# Patient Record
Sex: Male | Born: 1950
Health system: Southern US, Community
[De-identification: ages and names within clinical notes are randomized; demographics above are authoritative.]

## PROBLEM LIST (undated history)

## (undated) DIAGNOSIS — Z800 Family history of malignant neoplasm of digestive organs: Secondary | ICD-10-CM

## (undated) DIAGNOSIS — E669 Obesity, unspecified: Secondary | ICD-10-CM

## (undated) DIAGNOSIS — E039 Hypothyroidism, unspecified: Secondary | ICD-10-CM

## (undated) DIAGNOSIS — R001 Bradycardia, unspecified: Secondary | ICD-10-CM

## (undated) DIAGNOSIS — I219 Acute myocardial infarction, unspecified: Secondary | ICD-10-CM

## (undated) DIAGNOSIS — D126 Benign neoplasm of colon, unspecified: Secondary | ICD-10-CM

## (undated) DIAGNOSIS — E559 Vitamin D deficiency, unspecified: Secondary | ICD-10-CM

## (undated) DIAGNOSIS — I2510 Atherosclerotic heart disease of native coronary artery without angina pectoris: Secondary | ICD-10-CM

## (undated) DIAGNOSIS — Z951 Presence of aortocoronary bypass graft: Secondary | ICD-10-CM

## (undated) DIAGNOSIS — R7303 Prediabetes: Secondary | ICD-10-CM

## (undated) DIAGNOSIS — I509 Heart failure, unspecified: Secondary | ICD-10-CM

## (undated) DIAGNOSIS — J449 Chronic obstructive pulmonary disease, unspecified: Secondary | ICD-10-CM

## (undated) DIAGNOSIS — E785 Hyperlipidemia, unspecified: Secondary | ICD-10-CM

## (undated) DIAGNOSIS — F32A Depression, unspecified: Secondary | ICD-10-CM

## (undated) DIAGNOSIS — I2119 ST elevation (STEMI) myocardial infarction involving other coronary artery of inferior wall: Secondary | ICD-10-CM

## (undated) DIAGNOSIS — F329 Major depressive disorder, single episode, unspecified: Secondary | ICD-10-CM

## (undated) DIAGNOSIS — I10 Essential (primary) hypertension: Secondary | ICD-10-CM

## (undated) DIAGNOSIS — Z720 Tobacco use: Secondary | ICD-10-CM

## (undated) DIAGNOSIS — K219 Gastro-esophageal reflux disease without esophagitis: Secondary | ICD-10-CM

## (undated) HISTORY — PX: CORONARY ANGIOPLASTY WITH STENT PLACEMENT: SHX49

## (undated) HISTORY — PX: BACK SURGERY: SHX140

## (undated) HISTORY — DX: Depression, unspecified: F32.A

## (undated) HISTORY — DX: Tobacco use: Z72.0

## (undated) HISTORY — DX: Essential (primary) hypertension: I10

## (undated) HISTORY — DX: Major depressive disorder, single episode, unspecified: F32.9

## (undated) HISTORY — DX: Acute myocardial infarction, unspecified: I21.9

## (undated) HISTORY — DX: Family history of malignant neoplasm of digestive organs: Z80.0

## (undated) HISTORY — DX: Prediabetes: R73.03

## (undated) HISTORY — DX: Gastro-esophageal reflux disease without esophagitis: K21.9

## (undated) HISTORY — PX: KNEE SURGERY: SHX244

## (undated) HISTORY — DX: Vitamin D deficiency, unspecified: E55.9

## (undated) HISTORY — DX: ST elevation (STEMI) myocardial infarction involving other coronary artery of inferior wall: I21.19

## (undated) HISTORY — DX: Hypothyroidism, unspecified: E03.9

## (undated) HISTORY — DX: Obesity, unspecified: E66.9

## (undated) HISTORY — DX: Benign neoplasm of colon, unspecified: D12.6

## (undated) HISTORY — DX: Bradycardia, unspecified: R00.1

## (undated) HISTORY — DX: Hyperlipidemia, unspecified: E78.5

## (undated) HISTORY — DX: Atherosclerotic heart disease of native coronary artery without angina pectoris: I25.10

## (undated) HISTORY — PX: TONSILLECTOMY: SHX5217

---

## 2003-12-14 ENCOUNTER — Inpatient Hospital Stay (HOSPITAL_COMMUNITY): Admission: EM | Admit: 2003-12-14 | Discharge: 2003-12-15 | Payer: Self-pay | Admitting: Emergency Medicine

## 2007-08-16 ENCOUNTER — Encounter: Admission: RE | Admit: 2007-08-16 | Discharge: 2007-08-16 | Payer: Self-pay | Admitting: Internal Medicine

## 2009-02-01 ENCOUNTER — Ambulatory Visit: Payer: Self-pay | Admitting: Gastroenterology

## 2009-02-15 ENCOUNTER — Encounter: Payer: Self-pay | Admitting: Gastroenterology

## 2009-02-15 ENCOUNTER — Ambulatory Visit: Payer: Self-pay | Admitting: Gastroenterology

## 2009-02-15 DIAGNOSIS — D126 Benign neoplasm of colon, unspecified: Secondary | ICD-10-CM

## 2009-02-15 HISTORY — PX: COLONOSCOPY: SHX174

## 2009-02-15 HISTORY — DX: Benign neoplasm of colon, unspecified: D12.6

## 2009-02-16 ENCOUNTER — Encounter: Payer: Self-pay | Admitting: Gastroenterology

## 2009-02-19 DIAGNOSIS — Z860100 Personal history of colon polyps, unspecified: Secondary | ICD-10-CM | POA: Insufficient documentation

## 2009-02-26 ENCOUNTER — Ambulatory Visit: Payer: Self-pay | Admitting: Genetic Counselor

## 2009-05-17 ENCOUNTER — Encounter: Payer: Self-pay | Admitting: Gastroenterology

## 2009-06-24 ENCOUNTER — Emergency Department (HOSPITAL_COMMUNITY): Admission: EM | Admit: 2009-06-24 | Discharge: 2009-06-24 | Payer: Self-pay | Admitting: Orthopaedic Surgery

## 2009-10-11 ENCOUNTER — Ambulatory Visit (HOSPITAL_COMMUNITY): Admission: RE | Admit: 2009-10-11 | Discharge: 2009-10-11 | Payer: Self-pay | Admitting: Internal Medicine

## 2010-01-21 ENCOUNTER — Encounter (INDEPENDENT_AMBULATORY_CARE_PROVIDER_SITE_OTHER): Payer: Self-pay | Admitting: *Deleted

## 2010-09-21 ENCOUNTER — Telehealth: Payer: Self-pay | Admitting: Gastroenterology

## 2011-01-17 NOTE — Procedures (Signed)
Summary: Colonoscopy   Colonoscopy  Procedure date:  02/15/2009  Findings:      Location:  [facility].    Procedures Next Due Date:    Colonoscopy: 02/2010  COLONOSCOPY PROCEDURE REPORT  PATIENT:  Curtis, Perez  MR#:  [id number] BIRTHDATE:   March 26, 1951   GENDER:   male  ENDOSCOPIST:   Curtis Rea. Jarold Motto, MD, [facility] Referred by: Curtis Perez, M.D.  PROCEDURE DATE:  02/15/2009 PROCEDURE:  Colonoscopy with snare polypectomy, Colonoscopy for control of bleeding ASA CLASS:   Class II INDICATIONS: family history of colon cancer   MEDICATIONS:    Fentanyl 100 mcg IV, Versed 10 mg IV  DESCRIPTION OF PROCEDURE:   After the risks benefits and alternatives of the procedure were thoroughly explained, informed consent was obtained.  Digital rectal exam was performed and revealed no abnormalities.   The LB [id number] endoscope was introduced through the anus and advanced to the cecum, which was identified by both the appendix and ileocecal valve, without limitations.  The quality of the prep was good, using MoviPrep.  The instrument was then slowly withdrawn as the colon was fully examined. <<PROCEDUREIMAGES>>              <<OLD IMAGES>>  FINDINGS:  Moderate diverticulosis was found. ENTIRE COLON FULL OF TICS.  There were multiple polyps identified and removed. transverse to sigmoid GREATER THAN 20 POLYPS EXCISED. A LARGE,FLAT VILLOUS ADENOMA PIECEMEAL EXCISED FROM THE HEPATIC AREA.OTHER LARGE POLYPS STALKS WERE INJECTED WITH 1/10,000 EPINEPHRINE AND THEN SNARE EXCISED, AND ALSO MULTIPLE FLAT ADENOMAS REMOVED.   Retroflexed views in the rectum revealed no abnormalities.    The scope was then withdrawn from the patient and the procedure completed.  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 RECOMMENDATIONS:  1) No  aspirin or NSAID's for 2 weeks  2) await biopsy results  WILL REFER FOR GENETIC ANALYSIS AND EVALUATION.REPEAT COLON EXAM IN 1 YEAR.  REPEAT EXAM:   No   _______________________________ Curtis Rea. Jarold Motto, MD, [facility]  CC: Curtis Cowboy, MD        REPORT OF SURGICAL PATHOLOGY   Case #: [id number] Patient Name: Curtis Perez Office Chart Number:  [id number]   MRN: [id number] Pathologist: Curtis Server A. Delila Spence, MD DOB/Age  08/12/1951 (Age: 60)    Gender: M Date Taken:  02/15/2009 Date Received: 02/15/2009   FINAL DIAGNOSIS   ***MICROSCOPIC EXAMINATION AND DIAGNOSIS***   1.  HEPATIC FLEXURE, COLON POLYP(S):  ADENOMATOUS POLYP(S).  NO HIGH GRADE DYSPLASIA OR INVASIVE MALIGNANCY IDENTIFIED.  (THREE FRAGMENTS)   2.  HEPATIC FLEXURE COLON, POLYP(S):   - HYPERPLASTIC POLYP(S).  NO ADENOMATOUS CHANGE OR MALIGNANCY IDENTIFIED. - ONE FRAGMENT WITH SUBMUCOSAL ADIPOSE TISSUE CONSISTENT WITH LIPOMA.    3.  SIGMOID COLON, POLYP:   - TUBULOVILLOUS ADENOMA.  NO HIGH GRADE DYSPLASIA OR MALIGNANCY IDENTIFIED. - BASE OF POLYP FREE OF ADENOMATOUS CHANGE.    4.  RECTOSIGMOID COLON, POLYP:   - TUBULAR ADENOMA.  NO HIGH GRADE DYSPLASIA OR MALIGNANCY IDENTIFIED. - BASE OF POLYP FREE OF ADENOMATOUS CHANGE. - FOUR SEPARATE HYPERPLASTIC POLYPS AND ONE SMALL ADENOMATOUS POLYP.    mj Date Reported:  02/16/2009     Curtis Server A. Delila Spence, MD *** Electronically Signed Out By Curtis Perez ***      February 16, 2009 MRN: [id number]    [place] [address]  [place], [place]  [address]    Dear Curtis Perez,  I am pleased to inform you that the colon polyp(s) removed during your recent colonoscopy was (were) found to be benign (no cancer detected) upon pathologic examination.  I recommend you have a repeat colonoscopy examination in 1 years to look for recurrent polyps, as having colon polyps increases your risk for having recurrent polyps or even colon cancer in the future.  Should you develop new or  worsening symptoms of abdominal pain, bowel habit changes or bleeding from the rectum or bowels, please schedule an evaluation with either your primary care physician or with me.  Additional information/recommendations:  __ No further action with gastroenterology is needed at this time. Please      follow-up with your primary care physician for your other healthcare      needs.  __ Please call [phone] to schedule a return visit to review your      situation.  __ Please keep your follow-up visit as already scheduled.  _xx_ Continue treatment plan as outlined the day of your exam.  Please call us if you are having persistent problems or have questions about your condition that have not been fully answered at this time.  Sincerely,  Curtis Layman MD [facility]  This letter has been electronically signed by your physician.    This report was created from the original endoscopy report, which was reviewed and signed by the above listed endoscopist.  Appended Document: Orders Update records faxed   Clinical Lists Changes  Problems: Added new problem of COLONIC POLYPS, ADENOMATOUS, HX OF (ICD-V12.72) Added new problem of NEOPLASM, COLON, FAMILY HX (ICD-V16.0) Orders: Added new Test order of Genetic Counselor Annia Friendly ([facility]) - Signed

## 2011-01-17 NOTE — Letter (Signed)
Summary: Genetic analysis/[facility]  Genetic analysis/[facility]   Imported By: Lester [place] 06/03/2009 10:35:52  _____________________________________________________________________  External Attachment:    Type:   Image     Comment:   External Document

## 2011-01-17 NOTE — Letter (Signed)
Summary: Patient Notice- Polyp Results  [facility]  [address] [place], [place] [address]   Phone: [phone]  Fax: [phone]        February 16, 2009 MRN: [id number]    Curtis Perez [address] [place], [place]  [address]    Dear [name],  I am pleased to inform you that the colon polyp(s) removed during your recent colonoscopy was (were) found to be benign (no cancer detected) upon pathologic examination.  I recommend you have a repeat colonoscopy examination in 1 years to look for recurrent polyps, as having colon polyps increases your risk for having recurrent polyps or even colon cancer in the future.  Should you develop new or worsening symptoms of abdominal pain, bowel habit changes or bleeding from the rectum or bowels, please schedule an evaluation with either your primary care physician or with me.  Additional information/recommendations:  __ No further action with gastroenterology is needed at this time. Please      follow-up with your primary care physician for your other healthcare      needs.  __ Please call [phone] to schedule a return visit to review your      situation.  __ Please keep your follow-up visit as already scheduled.  _xx_ Continue treatment plan as outlined the day of your exam.  Please call us if you are having persistent problems or have questions about your condition that have not been fully answered at this time.  Sincerely,  Mardella Layman MD [facility]  This letter has been electronically signed by your physician.

## 2011-01-17 NOTE — Letter (Signed)
Summary: Colonoscopy Letter  [facility]  [address] [place], [place] [address]   Phone: [phone]  Fax: [phone]      January 21, 2010 MRN: [id number]   Curtis Perez [address] [place], [place]  [address]   Dear Mr. Milling,   According to your medical record, it is time for you to schedule a Colonoscopy. The American Cancer Society recommends this procedure as a method to detect early colon cancer. Patients with a family history of colon cancer, or a personal history of colon polyps or inflammatory bowel disease are at increased risk.  This letter has beeen generated based on the recommendations made at the time of your procedure. If you feel that in your particular situation this may no longer apply, please contact our office.  Please call our office at [phone] to schedule this appointment or to update your records at your earliest convenience.  Thank you for cooperating with [place] to provide you with the very best care possible.   Sincerely,  Vania Rea. Jarold Motto, M.D.  [facility] Gastroenterology Division [phone]

## 2011-01-17 NOTE — Miscellaneous (Signed)
Summary: [facility] Previsit/prep  Clinical Lists Changes  Medications: Added new medication of MOVIPREP 100 GM  SOLR (PEG-KCL-NACL-NASULF-NA ASC-C) As per prep instructions. - Signed Rx of MOVIPREP 100 GM  SOLR (PEG-KCL-NACL-NASULF-NA ASC-C) As per prep instructions.;  #1 x 0;  Signed;  Entered by: Wyona Almas RN;  Authorized by: Mardella Layman MD [facility];  Method used: Electronically to [facility] [address] [place], [place], [place]  [address], Ph: [phone], Fax: [phone] Observations: Added new observation of NKA: T (02/01/2009 8:05)    Prescriptions: MOVIPREP 100 GM  SOLR (PEG-KCL-NACL-NASULF-NA ASC-C) As per prep instructions.  #1 x 0   Entered by:   Wyona Almas RN   Authorized by:   Mardella Layman MD [facility]   Signed by:   Wyona Almas RN on 02/01/2009   Method used:   Electronically to        [facility] [address] [facility] (retail)       [address]       [place], [place]  [address]       Ph: [phone]       Fax: [phone]   RxID:   [id number]

## 2011-01-17 NOTE — Progress Notes (Signed)
Summary: Schedule Colonoscopy  Phone Note Outgoing Call Call back at [facility] Phone [phone]   Call placed by: Harlow Mares CMA Duncan Dull),  September 21, 2010 11:15 AM Call placed to: Patient Summary of Call: Left a message on patients machine to call back. patient is due a colonscopy.  Initial call taken by: Harlow Mares CMA Duncan Dull),  September 21, 2010 11:16 AM  Follow-up for Phone Call        Left a message on the patient machine to call back and schedule a previsit and procedure with our office. A letter will be mailed to the patient.   Follow-up by: Harlow Mares CMA Duncan Dull),  September 30, 2010 11:19 AM

## 2011-05-05 NOTE — Cardiovascular Report (Signed)
Curtis Perez, Curtis Perez                            ACCOUNT NO.:  [id number]   MEDICAL RECORD NO.:  [id number]                   PATIENT TYPE:  INP   LOCATION:  2033                                 FACILITY:  [facility]   PHYSICIAN:  Francisca December, M.D.               DATE OF BIRTH:  11-01-1951   DATE OF PROCEDURE:  12/14/2003  DATE OF DISCHARGE:                              CARDIAC CATHETERIZATION   PROCEDURE PERFORMED:  1. Left heart catheterization.  2. Coronary angiography.  3. Left ventriculogram.  4. PCI/drug-eluting stent implantation first marginal branch.  5. Right femoral arteriogram with percutaneous closure/AngioSeal.   INDICATIONS:  Curtis Perez is a 60 year old man admitted last night after  prolonged chest pain.  He ruled in for a non ST segment elevation myocardial  infarction.  He was brought to the cardiac catheterization laboratory to  identify the extent of disease and provide for further therapeutic options.   PROCEDURAL NOTE:  The patient was brought to the cardiac catheterization  laboratory in the fasting state.  The right groin was prepped and draped in  the usual sterile fashion.  Local anesthesia was obtained with the  infiltration of 1% lidocaine.  A 6-French catheter sheath was inserted  percutaneously into the right femoral artery utilizing an anterior approach  over a guiding J-wire.  A 110 cm pigtail catheter was then used to measure  pressures in the ascending aorta and in the left ventricle both prior to and  following the ventriculogram.  A 30 degree RAO cineangiogram left  ventriculogram was performed utilizing a power injector.  Cineangiography of  the right and left coronary arteries were then performed using 6-French #4  right and left Judkins catheters.  We then proceeded with coronary  intervention.   A 6-French #4 CLS guiding catheter was advanced to the ascending aorta where  the left coronary os was engaged.  The patient received 6800 units of  heparin.  He was already on a constant infusion of Integrilin.  Resulting  ACT was 270 seconds.  A 0.014 Scimed Luge intracoronary guidewire was  attempted to cross a complete occlusion of the first marginal branch.  This  was unsuccessful.  It was replaced with a 0.014 Asahi medium wire.  This  wire was successful in crossing the lesion, but did require balloon backup.  This was using a 2.5 x 20 mm Scimed Maverick intracoronary balloon.  This  crossed the lesion as well and was inflated to a peak pressure of 8  atmospheres in three different positions for not greater than 70 seconds.  It was removed and a 3.0 x 18 mm Taxus intracoronary balloon was advanced  into the vessel.  It was deployed there at a peak pressure of 14 atmospheres  for 40 seconds.  Following this the intracoronary administration of 0.2  nitroglycerin x2 a 2.5 x 20 mm Maverick was advanced into the artery  just  distal to the stent, inflated there to a maximum of 6 atmospheres for 65  seconds on two occasions.  At completion of the procedure and confirmation  of adequate patency in orthogonal views the guiding catheter was removed.  A  45 degree RAO cineangiogram of the right femoral artery was performed using  hand injection.  It confirmed femoral artery to be widely patent without  significant atherosclerotic disease and the arteriotomy site was well above  the bifurcation of the profunda femoris and superficial femoral artery.  Subsequently, an AngioSeal percutaneous closure device was deployed  successfully.  The patient was then transferred to the recovery area with an  intact distal pulse.   HEMODYNAMICS:  Systemic arterial pressure was 134/85 with a mean of 104  mmHg.  There was no systolic gradient across the aortic valve.  The left  ventricular end-diastolic pressure was 29 mmHg pre and post ventriculogram.   ANGIOGRAPHY:  The left ventriculogram demonstrated normal chamber size and  normal global systolic  function.  There was no obvious wall motion  abnormality in the RAO projection.  A visual estimate of the ejection  fraction is 65%.  There is no mitral regurgitation.  The aortic valve is  trileaflet and opens normally.  No coronary calcification is obvious.   There was a left dominant coronary system present.  The main left coronary  artery was normal.   The left anterior descending artery had mild atherosclerotic disease with a  40% narrowing in the mid portion after the origin of a moderate sized  diagonal branch.  There is a second diagonal branch that arises that is  small to moderate in size.  The ongoing anterior descending artery has  luminal irregularities and traverses the apex.   The left circumflex coronary artery and its branches are highly diseased.  The first marginal branch, as mentioned above, was completely occluded about  15 mm from its origin.  Minimal distal left to left collateral flow was  seen.  The ongoing circumflex then gives rise to a moderate sized  bifurcating posterolateral branch and a moderate sized posterior descending  artery.  There are luminal irregularities throughout the left circumflex and  its branches, but no significant obstructions.   The right coronary artery was small and nondominant.  There are luminal  irregularities as well in this vessel, especially in the right ventricular  branch.   Following balloon dilatation and stent implantation in the circumflex  marginal branch, there is no residual stenosis within the stented segment.  There is a 10-15% residual stenosis just distal to the stent.  This is the  area where secondary balloon dilatation was performed.  I did not feel that  this residual degree of stenosis was worth trying to deploy a second stent  through the original.   FINAL IMPRESSION:  1. Atherosclerotic coronary vascular disease, single vessel. 2. Status post non ST segment elevation myocardial infarction, apparently      laterally.  3. Intact left ventricular size and global systolic function, ejection     fraction 65%.  4. Successful percutaneous closure right femoral artery.  5. Elevated left ventricular end-diastolic pressure.                                               Francisca December, M.D.    JHE/MEDQ  D:  12/14/2003  T:  12/14/2003  Job:  [id number]   cc:   Cath Lab

## 2012-02-01 ENCOUNTER — Encounter: Payer: Self-pay | Admitting: Cardiovascular Disease

## 2012-02-08 ENCOUNTER — Institutional Professional Consult (permissible substitution): Payer: Self-pay | Admitting: Internal Medicine

## 2012-02-22 ENCOUNTER — Encounter: Payer: Self-pay | Admitting: *Deleted

## 2012-02-22 ENCOUNTER — Ambulatory Visit (INDEPENDENT_AMBULATORY_CARE_PROVIDER_SITE_OTHER): Payer: BC Managed Care – PPO | Admitting: Cardiovascular Disease

## 2012-02-22 ENCOUNTER — Encounter: Payer: Self-pay | Admitting: Cardiovascular Disease

## 2012-02-22 DIAGNOSIS — R079 Chest pain, unspecified: Secondary | ICD-10-CM

## 2012-02-22 DIAGNOSIS — I2510 Atherosclerotic heart disease of native coronary artery without angina pectoris: Secondary | ICD-10-CM

## 2012-02-22 NOTE — Patient Instructions (Addendum)
Your physician recommends that you schedule a follow-up appointment in: 3 MONTHS   Your physician has requested that you have en exercise stress myoview. For further information please visit [email or web address] Please follow instruction sheet, as given.    Your physician recommends that you continue on your current medications as directed. Please refer to the Current Medication list given to you today.

## 2012-02-22 NOTE — Assessment & Plan Note (Signed)
Curtis Perez presents with a history of coronary artery disease. He had a stent placed by Dr. Marlowe Aschoff approximately 8 years ago. He his EKG suggested he's had a previous inferior wall myocardial infarction I suspect that he has a stent in his RCA.  He's now presenting with episodes of chest pain that he is convinced are due to a GI etiology. He's changed his diet and is eating nothing but saltine crackers and raisins for the past several weeks. He states that the pains have improved. He still has lots of pain that is relieved by passing gas or by belching.  Given his history of coronary disease, hypertension, and hyperlipidemia I think that we should proceed with a stress Myoview study to rule out restenosis of his stent. If this turns out to be negative then I would suggest that we proceed with a GI workup. I'll see him again in several months for followup visit.

## 2012-02-22 NOTE — Progress Notes (Signed)
Curtis Perez Date of Birth  1951-06-11 [facility]     [place] Office  [address]    [address]   [address] [place], [place]  [address]    [place], [place]  [address] [phone]  Fax  [phone]  [phone]  Fax [phone]  Problem List: 1. CAD 2. Chest pain 3. hypertension.  History of Present Illness:  Curtis Perez is a 61 year old gentleman with a history of coronary artery disease. He's had a stent placed approximately 8 years ago. He presents for further evaluation of some chest pain. Tai  typically occur when he would lie down.  Over the past 3 weeks he's not been eating any spicy or greasy foods.   In eating mostly saltine crackers and raisins for the past weeks. The pains have completely resolved over that time. He also notes that he had a lot of gas. The pain is typically relieved if he passes gas or belches.  He has not had any regular exercise for the past several years.  He smokes 1 1/2 packs of cigarettes a day.  He has noticed a little bit of chest discomfort when he brings in the groceries or brings in the garbage can x3. He typically belches and the pain goes away.   Current Outpatient Prescriptions  Medication Sig Dispense Refill  . aspirin 325 MG tablet Take 325 mg by mouth daily.      Marland Kitchen atenolol (TENORMIN) 50 MG tablet Take 50 mg by mouth daily.      Jennette Banker Sodium 30-100 MG CAPS Take 100 mg by mouth daily.      . Cyanocobalamin (VITAMIN B-12 PO) Take by mouth.      . Magnesium 500 MG CAPS Take 500 mg by mouth daily.      . naproxen sodium (ALEVE) 220 MG tablet Take 220 mg by mouth daily.      . Omega-3 Fatty Acids (FISH OIL PO) Take 2,000 mg by mouth daily.      . pravastatin (PRAVACHOL) 40 MG tablet Take 40 mg by mouth daily.      . Vitamin D, Ergocalciferol, (DRISDOL) 50000 UNITS CAPS Take 50,000 Units by mouth.        No Known Allergies  Past Medical History  Diagnosis Date  . Hypertension   .  Hyperlipidemia   . Diabetes mellitus   . Bradycardia     Past Surgical History  Procedure Date  . Tonsillectomy   . Knee surgery   . Back surgery   . Coronary angioplasty with stent placement     about 8 years ago at [facility]    History  Smoking status  . Current Everyday Smoker  Smokeless tobacco  . Not on file    History  Alcohol Use  . Yes    Rarely    History reviewed. No pertinent family history.  Reviw of Systems:  Reviewed in the HPI.  All other systems are negative.  Physical Exam: Blood pressure 150/86, pulse 52, height 6' 2.5" (1.892 m), weight 252 lb 6.4 oz (114.488 kg). General: Well developed, well nourished, in no acute distress.  Head: Normocephalic, atraumatic, sclera non-icteric, mucus membranes are moist,   Neck: Supple. Carotids are 2 + without bruits. No JVD  Lungs: Clear bilaterally to auscultation.  Heart: regular rate.  normal  S1 S2. No murmurs, gallops or rubs.  Abdomen: Soft, non-tender, non-distended with normal bowel sounds. No hepatomegaly. No rebound/guarding. No masses.  Msk:  Strength and tone are normal  Extremities: No clubbing or cyanosis. No edema.  Distal pedal pulses are 2+ and equal bilaterally.  Neuro: Alert and oriented X 3. Moves all extremities spontaneously.  Psych:  Responds to questions appropriately with normal   ECG: sinus bradycardia. He has an old in for her wall myocardial infarction. He has T-wave inversions in leads V5 and V6. affect.   Assessment / Plan:

## 2012-03-04 ENCOUNTER — Ambulatory Visit (HOSPITAL_COMMUNITY): Payer: BC Managed Care – PPO | Attending: Cardiology | Admitting: Radiology

## 2012-03-04 DIAGNOSIS — R002 Palpitations: Secondary | ICD-10-CM | POA: Insufficient documentation

## 2012-03-04 DIAGNOSIS — F17200 Nicotine dependence, unspecified, uncomplicated: Secondary | ICD-10-CM | POA: Insufficient documentation

## 2012-03-04 DIAGNOSIS — I252 Old myocardial infarction: Secondary | ICD-10-CM | POA: Insufficient documentation

## 2012-03-04 DIAGNOSIS — E119 Type 2 diabetes mellitus without complications: Secondary | ICD-10-CM | POA: Insufficient documentation

## 2012-03-04 DIAGNOSIS — R079 Chest pain, unspecified: Secondary | ICD-10-CM

## 2012-03-04 DIAGNOSIS — I10 Essential (primary) hypertension: Secondary | ICD-10-CM | POA: Insufficient documentation

## 2012-03-04 DIAGNOSIS — Z9861 Coronary angioplasty status: Secondary | ICD-10-CM | POA: Insufficient documentation

## 2012-03-04 DIAGNOSIS — E785 Hyperlipidemia, unspecified: Secondary | ICD-10-CM | POA: Insufficient documentation

## 2012-03-04 DIAGNOSIS — R9431 Abnormal electrocardiogram [ECG] [EKG]: Secondary | ICD-10-CM | POA: Insufficient documentation

## 2012-03-04 DIAGNOSIS — I4949 Other premature depolarization: Secondary | ICD-10-CM

## 2012-03-04 MED ORDER — TECHNETIUM TC 99M TETROFOSMIN IV KIT
33.0000 | PACK | Freq: Once | INTRAVENOUS | Status: AC | PRN
  Administered 2012-03-04: 33 via INTRAVENOUS

## 2012-03-04 MED ORDER — REGADENOSON 0.4 MG/5ML IV SOLN
0.4000 mg | Freq: Once | INTRAVENOUS | Status: AC
  Administered 2012-03-04: 0.4 mg via INTRAVENOUS

## 2012-03-04 MED ORDER — TECHNETIUM TC 99M TETROFOSMIN IV KIT
11.0000 | PACK | Freq: Once | INTRAVENOUS | Status: AC | PRN
  Administered 2012-03-04: 11 via INTRAVENOUS

## 2012-03-04 NOTE — Progress Notes (Signed)
[facility] SITE 3 NUCLEAR MED [address] [place] [address] [phone]  Cardiology Nuclear Med Study  Curtis Perez is a 61 y.o. male     MRN : [id number]     DOB: 1951-08-31  Procedure Date: 03/04/2012  Nuclear Med Background Indication for Stress Test:  Evaluation for Ischemia, Stent Patency, PTCA Patency and Abnormal EKG:Old inferior wall MI,Twave inversion V5 and V6 History: '04 MI: NSTEMI: Heart Cath: EF: 65% LAD 40% Stent CFX, '04 Stents: CFX @ [facility]iac Risk Factors: Hypertension, Lipids, NIDDM and Smoker  Symptoms:  Chest Pain and Palpitations   Nuclear Pre-Procedure Caffeine/Decaff Intake:  12:00am NPO After: 7:00am   Lungs:  clear O2 Sat: 96-98% on room air. IV 0.9% NS with Angio Cath:  22g  IV Site: R Wrist  IV Started by:  Cathlyn Parsons, RN  Chest Size (in):  48 Cup Size: n/a  Height: 6\' 2"  (1.88 m)  Weight:  248 lb (112.492 kg)  BMI:  Body mass index is 31.84 kg/(m^2). Tech Comments:  Atenolol held x 48 hrs    Nuclear Med Study 1 or 2 day study: 1 day  Stress Test Type:  Treadmill/Lexiscan  Reading MD: Olga Millers, MD  Order Authorizing Provider:  Jannette Spanner  Resting Radionuclide: Technetium 72m Tetrofosmin  Resting Radionuclide Dose: 10.7 mCi   Stress Radionuclide:  Technetium 48m Tetrofosmin  Stress Radionuclide Dose: 32.6 mCi           Stress Protocol Rest HR: 52 Stress HR: 122  Rest BP: 162/79 Stress BP: 160/66  Exercise Time (min): 5:41 METS: 5.80   Predicted Max HR: 162 bpm % Max HR: 75.31 bpm Rate Pressure Product: 64403   Dose of Adenosine (mg):  n/a Dose of Lexiscan: 0.4 mg  Dose of Atropine (mg): n/a Dose of Dobutamine: n/a mcg/kg/min (at max HR)  Stress Test Technologist: Milana Na, EMT-P  Nuclear Technologist:  Domenic Polite, CNMT     Rest Procedure:  Myocardial perfusion imaging was performed at rest 45 minutes following the intravenous administration of Technetium 37m Tetrofosmin. Rest  ECG: Sinus Bradycardia with occ pvcs  Stress Procedure:  The patient received IV Lexiscan 0.4 mg over 15-seconds with concurrent low level exercise and then Technetium 29m Tetrofosmin was injected at 30-seconds while the patient continued walking one more minute. There were non specific changes, + sob, and occ pvcs with Lexiscan. Quantitative spect images were obtained after a 45-minute delay. Stress ECG: Significant ST abnormalities consistent with ischemia.  QPS Raw Data Images:  Acquisition technically good; LVE. Stress Images:  There is decreased uptake in the inferior wall, septum and apex. Rest Images:  There is decreased uptake in the inferior wall and apex, less prominent compared to the stress images. Subtraction (SDS):  These findings are consistent with a prior inferior and apical infarct with moderate peri-infarct ischemia; there is also ischemia in the septum. Transient Ischemic Dilatation (Normal <1.22):  1.07 Lung/Heart Ratio (Normal <0.45):  .29  Quantitative Gated Spect Images QGS EDV:  173 ml QGS ESV:  106 ml  Impression Exercise Capacity:  Lexiscan with low level exercise. BP Response:  Normal blood pressure response. Clinical Symptoms:  There is dyspnea. ECG Impression:  Significant ST abnormalities consistent with ischemia. Comparison with Prior Nuclear Study: No images to compare  Overall Impression:  Abnormal stress nuclear study with a large, severe, partially reversible inferior and apical defect consistent with prior infarct and moderate peri-infarct ischemia; there is also a small, reversible  defect in the septum consistent with mild ischemia.  LV Ejection Fraction: 39%.  LV Wall Motion:  Inferior akinesis.  Olga Millers

## 2012-03-05 ENCOUNTER — Telehealth: Payer: Self-pay | Admitting: *Deleted

## 2012-03-05 NOTE — Telephone Encounter (Signed)
PT INFORMED HIS STRESS TEST WAS  POSITIVE, PT DECLINED VISIT TOMORROW, REQUESTED NEXT THURSDAY, ONE WEEK AWAY. AGREED TO PLAN, APP MADE.

## 2012-03-14 ENCOUNTER — Encounter: Payer: Self-pay | Admitting: Cardiovascular Disease

## 2012-03-14 ENCOUNTER — Encounter: Payer: Self-pay | Admitting: *Deleted

## 2012-03-14 ENCOUNTER — Ambulatory Visit (INDEPENDENT_AMBULATORY_CARE_PROVIDER_SITE_OTHER): Payer: BC Managed Care – PPO | Admitting: Cardiovascular Disease

## 2012-03-14 VITALS — BP 152/76 | HR 54 | Ht 74.5 in | Wt 251.4 lb

## 2012-03-14 DIAGNOSIS — I2510 Atherosclerotic heart disease of native coronary artery without angina pectoris: Secondary | ICD-10-CM

## 2012-03-14 DIAGNOSIS — Z01810 Encounter for preprocedural cardiovascular examination: Secondary | ICD-10-CM

## 2012-03-14 NOTE — Progress Notes (Signed)
Alfred Levins Date of Birth  [birth year] [facility]     [place] Office  [address]    [address]   [address] [place], [place]  [address]    [place], [place]  [address] [phone]  Fax  [phone]  [phone]  Fax [phone]  Problem List: 1. CAD 2. Chest pain 3. hypertension.  History of Present Illness:  Mr. Cull is a 61 year old gentleman with a history of coronary artery disease. He's had a right coronary artery stent placed approximately 8 years ago. He presents for further evaluation of some chest pain. [place]  typically occur when he would lie down.  Over the past 3 weeks he's not been eating any spicy or greasy foods.   In eating mostly saltine crackers and raisins for the past weeks. The pains have completely resolved over that time. He also notes that he had a lot of gas. The pain is typically relieved if he passes gas or belches.  He has not had any regular exercise for the past several years.  He smokes 1 1/2 packs of cigarettes a day.  He has noticed a little bit of chest discomfort when he brings in the groceries or brings in the garbage can x3. He typically belches and the pain goes away.  He had a stress myoview last week which revealed a previous inferior MI and the presence of Anterior septal reversible ischemia.   Current Outpatient Prescriptions  Medication Sig Dispense Refill  . aspirin 325 MG tablet Take 325 mg by mouth daily.      Marland Kitchen atenolol (TENORMIN) 50 MG tablet Take 50 mg by mouth daily.      Jennette Banker Sodium 30-100 MG CAPS Take 100 mg by mouth daily. Stool Softner      . Cyanocobalamin (VITAMIN B-12 PO) Take by mouth.      . Magnesium 500 MG CAPS Take 500 mg by mouth daily.      . naproxen sodium (ALEVE) 220 MG tablet Take 220 mg by mouth 2 (two) times daily with a meal.       . Omega-3 Fatty Acids (FISH OIL PO) Take 2,000 mg by mouth daily.      . pravastatin (PRAVACHOL) 40 MG tablet Take 40 mg by mouth daily.       . Vitamin D, Ergocalciferol, (DRISDOL) 50000 UNITS CAPS Take 50,000 Units by mouth. Taking Four Times per Week        No Known Allergies  Past Medical History  Diagnosis Date  . Hypertension   . Hyperlipidemia   . Diabetes mellitus   . Bradycardia     Past Surgical History  Procedure Date  . Tonsillectomy   . Knee surgery   . Back surgery   . Coronary angioplasty with stent placement     about 8 years ago at [facility]    History  Smoking status  . Current Everyday Smoker  Smokeless tobacco  . Not on file    History  Alcohol Use  . Yes    Rarely    No family history on file.  Reviw of Systems:  Reviewed in the HPI.  All other systems are negative.  Physical Exam: Blood pressure 152/76, pulse 54, height 6' 2.5" (1.892 m), weight 251 lb 6.4 oz (114.034 kg). General: Well developed, well nourished, in no acute distress.  Head: Normocephalic, atraumatic, sclera non-icteric, mucus membranes are moist,   Neck: Supple. Carotids are 2 + without bruits. No JVD  Lungs: Clear bilaterally to auscultation.  Heart: regular rate.  normal  S1 S2. No murmurs, gallops or rubs.  Abdomen: Soft, non-tender, non-distended with normal bowel sounds. No hepatomegaly. No rebound/guarding. No masses.  Msk:  Strength and tone are normal  Extremities: No clubbing or cyanosis. Trace edema.  Distal pedal pulses are 2+ and equal bilaterally.  Neuro: Alert and oriented X 3. Moves all extremities spontaneously.  Psych:  Responds to questions appropriately with normal   ECG:  Assessment / Plan:

## 2012-03-14 NOTE — Patient Instructions (Signed)
Your physician recommends that you schedule a follow-up appointment in: 1 MONTH   LABS 03/18/12 SEE ME/// JODETTE RN AFTER FOR CATH PAPER WORK

## 2012-03-14 NOTE — Assessment & Plan Note (Addendum)
He has a history of an inferior wall myocardial infarction with a stent placed by Dr. Amil Amen. I presume that it was in the right coronary artery.  His most recent Myoview study reveals the presence of an old inferior myocardial infarction with the presence of anterior septal ischemia. I've recommended that we proceed with cardiac catheterization.  We discussed the risks, benefits, and options. He understands and agrees to proceed.  He refuses to quit smoking. He does not get any regular exercise. He has nitroglycerin.

## 2012-03-18 ENCOUNTER — Other Ambulatory Visit (INDEPENDENT_AMBULATORY_CARE_PROVIDER_SITE_OTHER): Payer: BC Managed Care – PPO

## 2012-03-18 ENCOUNTER — Encounter: Payer: Self-pay | Admitting: Gastroenterology

## 2012-03-18 DIAGNOSIS — I2510 Atherosclerotic heart disease of native coronary artery without angina pectoris: Secondary | ICD-10-CM

## 2012-03-18 DIAGNOSIS — Z01810 Encounter for preprocedural cardiovascular examination: Secondary | ICD-10-CM

## 2012-03-18 LAB — BASIC METABOLIC PANEL
BUN: 13 mg/dL (ref 6–23)
CO2: 24 mEq/L (ref 19–32)
Calcium: 9 mg/dL (ref 8.4–10.5)
Chloride: 106 mEq/L (ref 96–112)
Creatinine, Ser: 1.1 mg/dL (ref 0.4–1.5)
GFR: 74.56 mL/min (ref >60.00)
Glucose, Bld: 106 mg/dL — ABNORMAL HIGH (ref 70–99)
Potassium: 4.3 mEq/L (ref 3.5–5.1)
Sodium: 137 mEq/L (ref 135–145)

## 2012-03-18 LAB — CBC WITH DIFFERENTIAL/PLATELET
Basophils Absolute: 0 10*3/uL (ref 0.0–0.1)
Basophils Relative: 0.9 % (ref 0.0–3.0)
Eosinophils Absolute: 0.2 10*3/uL (ref 0.0–0.7)
Eosinophils Relative: 3 % (ref 0.0–5.0)
HCT: 40.1 % (ref 39.0–52.0)
Hemoglobin: 13.3 g/dL (ref 13.0–17.0)
Lymphocytes Relative: 28.2 % (ref 12.0–46.0)
Lymphs Abs: 1.4 10*3/uL (ref 0.7–4.0)
MCHC: 33.3 g/dL (ref 30.0–36.0)
MCV: 83.7 fl (ref 78.0–100.0)
Monocytes Absolute: 0.3 10*3/uL (ref 0.1–1.0)
Monocytes Relative: 5.9 % (ref 3.0–12.0)
Neutro Abs: 3.1 10*3/uL (ref 1.4–7.7)
Neutrophils Relative %: 62 % (ref 43.0–77.0)
Platelets: 184 10*3/uL (ref 150.0–400.0)
RBC: 4.79 Mil/uL (ref 4.22–5.81)
RDW: 14.9 % — ABNORMAL HIGH (ref 11.5–14.6)
WBC: 5 10*3/uL (ref 4.5–10.5)

## 2012-03-18 LAB — PROTIME-INR
INR: 0.9 ratio (ref 0.8–1.0)
Prothrombin Time: 10.4 s (ref 10.2–12.4)

## 2012-03-22 ENCOUNTER — Encounter (HOSPITAL_BASED_OUTPATIENT_CLINIC_OR_DEPARTMENT_OTHER)
Admission: RE | Disposition: A | Discharge status: Home / Self Care | Payer: Self-pay | Source: Ambulatory Visit | Attending: Cardiovascular Disease

## 2012-03-22 ENCOUNTER — Inpatient Hospital Stay (HOSPITAL_BASED_OUTPATIENT_CLINIC_OR_DEPARTMENT_OTHER)
Admission: RE | Admit: 2012-03-22 | Discharge: 2012-03-22 | Disposition: A | Discharge status: Home / Self Care | Payer: BC Managed Care – PPO | Source: Ambulatory Visit | Attending: Cardiovascular Disease | Admitting: Cardiovascular Disease

## 2012-03-22 ENCOUNTER — Encounter (HOSPITAL_BASED_OUTPATIENT_CLINIC_OR_DEPARTMENT_OTHER): Payer: Self-pay | Admitting: Cardiovascular Disease

## 2012-03-22 DIAGNOSIS — Z01810 Encounter for preprocedural cardiovascular examination: Secondary | ICD-10-CM

## 2012-03-22 DIAGNOSIS — I200 Unstable angina: Secondary | ICD-10-CM | POA: Insufficient documentation

## 2012-03-22 DIAGNOSIS — I2510 Atherosclerotic heart disease of native coronary artery without angina pectoris: Secondary | ICD-10-CM | POA: Insufficient documentation

## 2012-03-22 DIAGNOSIS — R9439 Abnormal result of other cardiovascular function study: Secondary | ICD-10-CM | POA: Insufficient documentation

## 2012-03-22 SURGERY — JV LEFT HEART CATHETERIZATION WITH CORONARY ANGIOGRAM
Anesthesia: Moderate Sedation

## 2012-03-22 MED ORDER — ONDANSETRON HCL 4 MG/2ML IJ SOLN
4.0000 mg | Freq: Four times a day (QID) | INTRAMUSCULAR | Status: DC | PRN
Start: 2012-03-22 — End: 2012-03-22

## 2012-03-22 MED ORDER — SODIUM CHLORIDE 0.9 % IJ SOLN: 3.0000 mL | INTRAMUSCULAR | Status: DC | PRN

## 2012-03-22 MED ORDER — SODIUM CHLORIDE 0.9 % IJ SOLN: 3.0000 mL | Freq: Two times a day (BID) | INTRAMUSCULAR | Status: DC

## 2012-03-22 MED ORDER — SODIUM CHLORIDE 0.9 % IV SOLN: INTRAVENOUS | Status: AC

## 2012-03-22 MED ORDER — ACETAMINOPHEN 325 MG PO TABS: 650.0000 mg | ORAL_TABLET | ORAL | Status: DC | PRN

## 2012-03-22 MED ORDER — ASPIRIN 81 MG PO CHEW: 324.0000 mg | CHEWABLE_TABLET | ORAL | Status: DC

## 2012-03-22 MED ORDER — SODIUM CHLORIDE 0.9 % IV SOLN: 250.0000 mL | INTRAVENOUS | Status: DC | PRN

## 2012-03-22 MED ORDER — DIAZEPAM 5 MG PO TABS
5.0000 mg | ORAL_TABLET | ORAL | Status: AC
  Administered 2012-03-22: 5 mg via ORAL

## 2012-03-22 MED ORDER — SODIUM CHLORIDE 0.9 % IV SOLN: 1.0000 mL/kg/h | INTRAVENOUS | Status: DC

## 2012-03-22 NOTE — Op Note (Signed)
    Cardiac Cath Note  DAYMON HORA [id number] 1951/03/17  Procedure: left Heart Cardiac Catheterization Note Indications: The patient has a history of stenting of the left circumflex artery. He presents with symptoms of unstable angina. He had a stress Myoview study which revealed the presence of an inferior basilar myocardial infarction with evidence of anterior septal ischemia.  Procedure Details Consent: Obtained Time Out: Verified patient identification, verified procedure, site/side was marked, verified correct patient position, special equipment/implants available, Radiology Safety Procedures followed,  medications/allergies/relevent history reviewed, required imaging and test results available.  Performed   Medications: Fentanyl: 50 mcg intravenous Versed: 2 mg intravenous  The right femoral artery was easily canulated using a modified Seldinger technique.  Hemodynamics:   LV pressure: 125/21 Aortic pressure: 126/78  Angiography   Left Main: The left main coronary artery is relatively smooth and normal.  Left anterior Descending: The left anterior descending artery is moderate to large vessel. It gives off several large diagonal branches. There is a tight 95% stenosis in the mid LAD just after giving off a large second diagonal branch. Very small branch originates in the middle of this stenosis. This branch is too small to be considered a target for bypass grafting..  The distal left anterior descending artery has minor luminal irregularities.  The first diagonal branch has minor luminal regularities between 30 and 40%.  The second diagonal branch has a tight 80% irregular stenosis.   Left Circumflex: The left complex artery is large and dominant. The proximal aspect of the circumflex artery is normal. It gives off a large first obtuse marginal artery has a stent in the proximal segment. There is 80-90% in-stent restenosis located within the stent. The remaining first obtuse  marginal artery has only minor luminal irregularities.  The second obtuse marginal artery is a small to moderate size vessel. It bifurcates very quickly. There is an 80% stenosis at the proximal segment and an 80-90% stenosis in the inferior segment. This branch may be too small to be a suitable bypass graft target.  The distal circumflex artery gives off a moderate-sized posterior descending artery. There is a tight 90% stenosis in the proximal aspect of the posterior descending artery.  Right Coronary Artery: The right coronary artery is small and is nondominant. It is severely and diffusely diseased.  LV Gram: Left ventricular gram was performed in the 30 RAO position. It reveals inferior basilar akinesis consistent with a previous inferior basilar myocardial infarction. The remaining left ventricle contracts fairly well. The ejection fraction is about a 55-60%.  Complications: No apparent complications Patient did tolerate procedure well.  Conclusions:   1. Significant three-vessel coronary artery disease. The patient has significant narrowings and I suspect will need coronary artery bypass grafting. We discussed these results with the patient and with the surgeons. 2. Overall well-preserved left infectious systolic function. He has evidence of a previous inferior basilar myocardial infarction.  Vesta Mixer, Montez Hageman., MD, [facility] 03/22/2012, 8:25 AM

## 2012-03-22 NOTE — Progress Notes (Signed)
Discharge instructions completed, ambulated to bathroom without bleeding from right groin site.  Discharged to home via wheelchair with friend. 

## 2012-03-22 NOTE — Interval H&P Note (Signed)
History and Physical Interval Note:  03/22/2012 8:22 AM  Curtis Perez  has presented today for surgery, with the diagnosis of cp  The various methods of treatment have been discussed with the patient and family. After consideration of risks, benefits and other options for treatment, the patient has consented to  Procedure(s) (LRB): JV LEFT HEART CATHETERIZATION WITH CORONARY ANGIOGRAM (N/A) as a surgical intervention .  The patients' history has been reviewed, patient examined, no change in status, stable for surgery.  I have reviewed the patients' chart and labs.  Questions were answered to the patient's satisfaction.     Elyn Aquas.

## 2012-03-22 NOTE — H&P (View-Only) (Signed)
Curtis Perez Date of Birth  1951-10-24 [facility]     [place] Office  [address]    [address]   [address] [place], [place]  [address]    [place], [place]  [address] [phone]  Fax  [phone]  [phone]  Fax [phone]  Problem List: 1. CAD 2. Chest pain 3. hypertension.  History of Present Illness:  Curtis Perez is a 61 year old gentleman with a history of coronary artery disease. He's had a right coronary artery stent placed approximately 8 years ago. He presents for further evaluation of some chest pain. [name]  typically occur when he would lie down.  Over the past 3 weeks he's not been eating any spicy or greasy foods.   In eating mostly saltine crackers and raisins for the past weeks. The pains have completely resolved over that time. He also notes that he had a lot of gas. The pain is typically relieved if he passes gas or belches.  He has not had any regular exercise for the past several years.  He smokes 1 1/2 packs of cigarettes a day.  He has noticed a little bit of chest discomfort when he brings in the groceries or brings in the garbage can x3. He typically belches and the pain goes away.  He had a stress myoview last week which revealed a previous inferior MI and the presence of Anterior septal reversible ischemia.   Current Outpatient Prescriptions  Medication Sig Dispense Refill  . aspirin 325 MG tablet Take 325 mg by mouth daily.      Marland Kitchen atenolol (TENORMIN) 50 MG tablet Take 50 mg by mouth daily.      Jennette Banker Sodium 30-100 MG CAPS Take 100 mg by mouth daily. Stool Softner      . Cyanocobalamin (VITAMIN B-12 PO) Take by mouth.      . Magnesium 500 MG CAPS Take 500 mg by mouth daily.      . naproxen sodium (ALEVE) 220 MG tablet Take 220 mg by mouth 2 (two) times daily with a meal.       . Omega-3 Fatty Acids (FISH OIL PO) Take 2,000 mg by mouth daily.      . pravastatin (PRAVACHOL) 40 MG tablet Take 40 mg by mouth daily.       . Vitamin D, Ergocalciferol, (DRISDOL) 50000 UNITS CAPS Take 50,000 Units by mouth. Taking Four Times per Week        No Known Allergies  Past Medical History  Diagnosis Date  . Hypertension   . Hyperlipidemia   . Diabetes mellitus   . Bradycardia     Past Surgical History  Procedure Date  . Tonsillectomy   . Knee surgery   . Back surgery   . Coronary angioplasty with stent placement     about 8 years ago at [facility]    History  Smoking status  . Current Everyday Smoker  Smokeless tobacco  . Not on file    History  Alcohol Use  . Yes    Rarely    No family history on file.  Reviw of Systems:  Reviewed in the HPI.  All other systems are negative.  Physical Exam: Blood pressure 152/76, pulse 54, height 6' 2.5" (1.892 m), weight 251 lb 6.4 oz (114.034 kg). General: Well developed, well nourished, in no acute distress.  Head: Normocephalic, atraumatic, sclera non-icteric, mucus membranes are moist,   Neck: Supple. Carotids are 2 + without bruits. No JVD  Lungs: Clear bilaterally to auscultation.  Heart: regular rate.  normal  S1 S2. No murmurs, gallops or rubs.  Abdomen: Soft, non-tender, non-distended with normal bowel sounds. No hepatomegaly. No rebound/guarding. No masses.  Msk:  Strength and tone are normal  Extremities: No clubbing or cyanosis. Trace edema.  Distal pedal pulses are 2+ and equal bilaterally.  Neuro: Alert and oriented X 3. Moves all extremities spontaneously.  Psych:  Responds to questions appropriately with normal   ECG:  Assessment / Plan:

## 2012-03-22 NOTE — Progress Notes (Signed)
Bedrest begins @ 0840.

## 2012-03-25 ENCOUNTER — Telehealth: Payer: Self-pay | Admitting: Cardiovascular Disease

## 2012-03-25 DIAGNOSIS — I2510 Atherosclerotic heart disease of native coronary artery without angina pectoris: Secondary | ICD-10-CM

## 2012-03-25 NOTE — Telephone Encounter (Signed)
Please call pt regarding post LHC results/ surgery.

## 2012-03-25 NOTE — Telephone Encounter (Signed)
Fu call °Pt was returning your call °

## 2012-03-25 NOTE — Telephone Encounter (Signed)
Do I need to call him.  My rec. Is that he has CABG.  Needs referal  To [facility]

## 2012-03-25 NOTE — Telephone Encounter (Signed)
Order placed, pt ok with referral. Requested Dr Sheliah Plane.

## 2012-03-27 ENCOUNTER — Telehealth: Payer: Self-pay | Admitting: Cardiovascular Disease

## 2012-03-27 ENCOUNTER — Other Ambulatory Visit: Payer: Self-pay | Admitting: Nurse Practitioner

## 2012-03-27 DIAGNOSIS — I2510 Atherosclerotic heart disease of native coronary artery without angina pectoris: Secondary | ICD-10-CM

## 2012-03-27 NOTE — Telephone Encounter (Signed)
Pt was wondering where we are with setting up with the surgeon

## 2012-03-28 ENCOUNTER — Other Ambulatory Visit: Payer: Self-pay | Admitting: Thoracic Surgery (Cardiothoracic Vascular Surgery)

## 2012-03-28 ENCOUNTER — Encounter (HOSPITAL_COMMUNITY): Payer: Self-pay | Admitting: Pharmacy Technician

## 2012-03-28 ENCOUNTER — Other Ambulatory Visit: Payer: Self-pay

## 2012-03-28 ENCOUNTER — Institutional Professional Consult (permissible substitution) (INDEPENDENT_AMBULATORY_CARE_PROVIDER_SITE_OTHER): Payer: BC Managed Care – PPO | Admitting: Thoracic Surgery (Cardiothoracic Vascular Surgery)

## 2012-03-28 ENCOUNTER — Encounter: Payer: Self-pay | Admitting: Thoracic Surgery (Cardiothoracic Vascular Surgery)

## 2012-03-28 VITALS — BP 157/83 | HR 56 | Resp 16 | Ht 75.0 in | Wt 250.0 lb

## 2012-03-28 DIAGNOSIS — I2510 Atherosclerotic heart disease of native coronary artery without angina pectoris: Secondary | ICD-10-CM

## 2012-03-28 DIAGNOSIS — R079 Chest pain, unspecified: Secondary | ICD-10-CM

## 2012-03-28 DIAGNOSIS — I219 Acute myocardial infarction, unspecified: Secondary | ICD-10-CM | POA: Insufficient documentation

## 2012-03-28 DIAGNOSIS — I10 Essential (primary) hypertension: Secondary | ICD-10-CM | POA: Insufficient documentation

## 2012-03-28 DIAGNOSIS — Z720 Tobacco use: Secondary | ICD-10-CM | POA: Insufficient documentation

## 2012-03-28 DIAGNOSIS — E785 Hyperlipidemia, unspecified: Secondary | ICD-10-CM | POA: Insufficient documentation

## 2012-03-28 HISTORY — DX: Tobacco use: Z72.0

## 2012-03-28 NOTE — Progress Notes (Signed)
[address]            [place] [address]          [phone]     CARDIOTHORACIC SURGERY CONSULTATION REPORT  Referring Provider is Nahser, Deloris Ping, MD PCP is Nadean Corwin, MD, MD  Chief Complaint  Patient presents with  . 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 Other risk factors include long-standing tobacco abuse, hypertension, hyperlipidemia. Patient has had a recent acceleration of symptoms of chest discomfort that are somewhat atypical but characterized as tightness or pressure like pain in the chest which typically occurs after meals.  He was seen in the office by Dr. Elease Hashimoto and underwent stress Myoview exam that was abnormal revealing signs of previous inferior wall myocardial infarction as well as anterior and septal reversible ischemia. The patient underwent elective cardiac catheterization last week demonstrating severe three-vessel coronary artery disease with mild left ventricular dysfunction. He was referred for possible surgical revascularization.  Past Medical History  Diagnosis Date  . Hypertension   . Hyperlipidemia   . Bradycardia   . Myocardial infarction     2004  . Tobacco abuse 03/28/2012    Past Surgical History  Procedure Date  . Tonsillectomy   . Knee surgery   . Back surgery   . Coronary angioplasty with stent placement     about 8 years ago at [facility]    No family history on file.  Social History History  Substance Use Topics  . Smoking status: Current Everyday Smoker -- 2.0 packs/day for 40 years  . Smokeless tobacco: Never Used  . Alcohol Use: No     Rarely    Current Outpatient Prescriptions  Medication Sig Dispense Refill  . aspirin 325 MG tablet Take 325 mg by mouth daily.      Marland Kitchen atenolol (TENORMIN) 50 MG tablet Take 50  mg by mouth daily.      Jennette Banker Sodium 30-100 MG CAPS Take 100 mg by mouth daily. Stool Softner      . Cyanocobalamin (VITAMIN B-12 PO) Take by mouth.      . Magnesium 500 MG CAPS Take 500 mg by mouth daily.      . naproxen sodium (ALEVE) 220 MG tablet Take 220 mg by mouth 2 (two) times daily with a meal.       . Omega-3 Fatty Acids (FISH OIL PO) Take 2,000 mg by mouth daily.      . pravastatin (PRAVACHOL) 40 MG tablet Take 40 mg by mouth daily.      . Vitamin D, Ergocalciferol, (DRISDOL) 50000 UNITS CAPS Take 50,000 Units by mouth. Taking Four Times per Week        No Known Allergies  Review of Systems:  General:  normal appetite, normal energy, + weight gain  Respiratory:  no cough, no wheezing, no hemoptysis, no pain with inspiration or cough, no shortness of breath  Cardiac:  + chest pain or tightness, no exertional SOB, no resting SOB, no PND, no orthopnea, + LE edema, no palpitations, no syncope  GI:   no difficulty swallowing, no hematochezia, no hematemesis, no melena, no constipation, no diarrhea, frequent gas  GU:   no dysuria, no urgency,  no frequency   Musculoskeletal: no arthritis, no arthralgia   Vascular:  no pain suggestive of claudication, some pain in feet while lying flat  Neuro:   no symptoms suggestive of TIA's, no seizures, no headaches, no peripheral neuropathy   Endocrine:  Negative   HEENT:  no loose teeth or painful teeth,  no recent vision changes  Psych:   no anxiety, no depression    Physical Exam:   BP 157/83  Pulse 56  Resp 16  Ht 6\' 3"  (1.905 m)  Wt 250 lb (113.399 kg)  BMI 31.25 kg/m2  SpO2 97%  General:  Moderately obese,  well-appearing  HEENT:  Unremarkable   Neck:   no JVD, no bruits, no adenopathy   Chest:   clear to auscultation, symmetrical breath sounds, no wheezes, no rhonchi   CV:   RRR, no  murmur   Abdomen:  soft, non-tender, no masses   Extremities:  warm, well-perfused, pulses not palpable, + bilateral LE  edema  Rectal/GU  Deferred  Neuro:   Grossly non-focal and symmetrical throughout  Skin:   Clean and dry, no rashes, no breakdown   Diagnostic Tests:  Cardiac Cath Note  ARIF AMENDOLA  [id number]  July 26, 1951  Procedure: left Heart Cardiac Catheterization Note  Indications: The patient has a history of stenting of the left circumflex artery. He presents with symptoms of unstable angina. He had a stress Myoview study which revealed the presence of an inferior basilar myocardial infarction with evidence of anterior septal ischemia.  Procedure Details  Consent: Obtained  Time Out: Verified patient identification, verified procedure, site/side was marked, verified correct patient position, special equipment/implants available, Radiology Safety Procedures followed, medications/allergies/relevent history reviewed, required imaging and test results available. Performed  Medications:  Fentanyl: 50 mcg intravenous  Versed: 2 mg intravenous  The right femoral artery was easily canulated using a modified Seldinger technique.  Hemodynamics:  LV pressure: 125/21  Aortic pressure: 126/78  Angiography  Left Main: The left main coronary artery is relatively smooth and normal.  Left anterior Descending: The left anterior descending artery is moderate to large vessel. It gives off several large diagonal branches. There is a tight 95% stenosis in the mid LAD just after giving off a large second diagonal branch. Very small branch originates in the middle of this stenosis. This branch is too small to be considered a target for bypass grafting.. The distal left anterior descending artery has minor luminal irregularities.  The first diagonal branch has minor luminal regularities between 30 and 40%. The second diagonal branch has a tight 80% irregular stenosis.  Left Circumflex: The left complex artery is large and dominant. The proximal aspect of the circumflex artery is normal. It gives off a large first obtuse  marginal artery has a stent in the proximal segment. There is 80-90% in-stent restenosis located within the stent. The remaining first obtuse marginal artery has only minor luminal irregularities.  The second obtuse marginal artery is a small to moderate size vessel. It bifurcates very quickly. There is an 80% stenosis at the proximal segment and an 80-90% stenosis in the inferior segment. This branch may be too small to be a suitable bypass graft target.  The distal circumflex artery gives off a moderate-sized posterior descending artery. There is a tight 90% stenosis in the proximal aspect of the posterior descending artery.  Right Coronary Artery: The right coronary artery is small and is nondominant. It is severely and diffusely diseased.  LV Gram: Left ventricular  gram was performed in the 30 RAO position. It reveals inferior basilar akinesis consistent with a previous inferior basilar myocardial infarction. The remaining left ventricle contracts fairly well. The ejection fraction is about a 55-60%.  Complications: No apparent complications  Patient did tolerate procedure well.  Conclusions:  1. Significant three-vessel coronary artery disease. The patient has significant narrowings and I suspect will need coronary artery bypass grafting. We discussed these results with the patient and with the surgeons.  2. Overall well-preserved left infectious systolic function. He has evidence of a previous inferior basilar myocardial infarction.  Vesta Mixer, Montez Hageman., MD, [facility]  03/22/2012, 8:25 AM    Impression:  Severe three-vessel coronary artery disease with mild left ventricular dysfunction. The patient has recent acceleration of symptoms of chest discomfort primarily following meals that probably corresponds to his equivalent of angina pectoris. The patient appears to be reasonably good candidate for surgery although he does have long-standing tobacco abuse and moderate obesity.   Plan:  I have  outlined the indications, risks, and potential benefits of coronary artery bypass grafting at length with the patient here in the office today. Alternative treatment strategies been discussed in detail. All of his questions been addressed. The need for him to quit smoking has been emphasized.  The patient understands and accepts all potential associated risks of surgery including but not limited to risk of death, stroke, myocardial infarction, congestive heart failure, respiratory failure, renal failure, bleeding requiring blood transfusion and/or reexploration, arrhythmia, heart block or bradycardia requiring permanent pacemaker, pneumonia, pleural effusion, wound infection, pulmonary embolus or other thromboembolic complication, chronic pain or other delayed complications.  All questions answered.  We plan to proceed with surgery on Tuesday, April 16.     Salvatore Decent. Cornelius Moras, MD 03/28/2012 12:58 PM

## 2012-03-28 NOTE — Patient Instructions (Signed)
Avoid any sort of strenuous physical activity and call EMS or go directly to the emergency room in the event of substernal chest discomfort that persists despite the administration of sublingual nitroglycerin.

## 2012-03-29 ENCOUNTER — Inpatient Hospital Stay (HOSPITAL_COMMUNITY)
Admission: RE | Admit: 2012-03-29 | Discharge: 2012-03-29 | Disposition: A | Discharge status: Home / Self Care | Payer: BC Managed Care – PPO | Source: Ambulatory Visit | Attending: Thoracic Surgery (Cardiothoracic Vascular Surgery) | Admitting: Thoracic Surgery (Cardiothoracic Vascular Surgery)

## 2012-03-29 ENCOUNTER — Encounter: Payer: BC Managed Care – PPO | Admitting: Nurse Practitioner

## 2012-03-29 ENCOUNTER — Other Ambulatory Visit (HOSPITAL_COMMUNITY): Payer: Self-pay | Admitting: Radiology

## 2012-03-29 ENCOUNTER — Ambulatory Visit (HOSPITAL_COMMUNITY)
Admission: RE | Admit: 2012-03-29 | Discharge: 2012-03-29 | Disposition: A | Discharge status: Home / Self Care | Payer: BC Managed Care – PPO | Source: Ambulatory Visit | Attending: Thoracic Surgery (Cardiothoracic Vascular Surgery) | Admitting: Thoracic Surgery (Cardiothoracic Vascular Surgery)

## 2012-03-29 ENCOUNTER — Encounter (HOSPITAL_COMMUNITY)
Admission: RE | Admit: 2012-03-29 | Discharge: 2012-03-29 | Disposition: A | Discharge status: Home / Self Care | Payer: BC Managed Care – PPO | Source: Ambulatory Visit | Attending: Thoracic Surgery (Cardiothoracic Vascular Surgery) | Admitting: Thoracic Surgery (Cardiothoracic Vascular Surgery)

## 2012-03-29 ENCOUNTER — Encounter (HOSPITAL_COMMUNITY): Payer: Self-pay

## 2012-03-29 ENCOUNTER — Other Ambulatory Visit (HOSPITAL_COMMUNITY): Payer: BC Managed Care – PPO

## 2012-03-29 VITALS — BP 156/80 | HR 51 | Temp 97.1°F | Resp 16 | Wt 254.4 lb

## 2012-03-29 DIAGNOSIS — I2510 Atherosclerotic heart disease of native coronary artery without angina pectoris: Secondary | ICD-10-CM | POA: Insufficient documentation

## 2012-03-29 DIAGNOSIS — Z01810 Encounter for preprocedural cardiovascular examination: Secondary | ICD-10-CM | POA: Insufficient documentation

## 2012-03-29 DIAGNOSIS — Z01818 Encounter for other preprocedural examination: Secondary | ICD-10-CM | POA: Insufficient documentation

## 2012-03-29 DIAGNOSIS — Z01812 Encounter for preprocedural laboratory examination: Secondary | ICD-10-CM | POA: Insufficient documentation

## 2012-03-29 LAB — ABO/RH: ABO/RH(D): A POS

## 2012-03-29 LAB — BLOOD GAS, ARTERIAL
Acid-base deficit: 0.9 mmol/L (ref 0.0–2.0)
Bicarbonate: 23 mEq/L (ref 20.0–24.0)
Drawn by: 344381
Patient temperature: 98.6
TCO2: 24.1 mmol/L (ref 0–100)
pCO2 arterial: 36.3 mmHg (ref 35.0–45.0)
pH, Arterial: 7.418 (ref 7.350–7.450)
pO2, Arterial: 74.4 mmHg — ABNORMAL LOW (ref 80.0–100.0)

## 2012-03-29 LAB — COMPREHENSIVE METABOLIC PANEL
ALT: 11 U/L (ref 0–53)
AST: 14 U/L (ref 0–37)
Albumin: 3.5 g/dL (ref 3.5–5.2)
Alkaline Phosphatase: 90 U/L (ref 39–117)
BUN: 12 mg/dL (ref 6–23)
CO2: 20 mEq/L (ref 19–32)
Calcium: 9.2 mg/dL (ref 8.4–10.5)
Chloride: 102 mEq/L (ref 96–112)
Creatinine, Ser: 0.9 mg/dL (ref 0.50–1.35)
GFR calc Af Amer: >90 mL/min (ref >90)
GFR calc non Af Amer: >90 mL/min (ref >90)
Glucose, Bld: 108 mg/dL — ABNORMAL HIGH (ref 70–99)
Potassium: 4.7 mEq/L (ref 3.5–5.1)
Sodium: 134 mEq/L — ABNORMAL LOW (ref 135–145)
Total Bilirubin: 0.3 mg/dL (ref 0.3–1.2)
Total Protein: 6.4 g/dL (ref 6.0–8.3)

## 2012-03-29 LAB — URINALYSIS, ROUTINE W REFLEX MICROSCOPIC
Bilirubin Urine: NEGATIVE
Glucose, UA: NEGATIVE mg/dL
Hgb urine dipstick: NEGATIVE
Ketones, ur: NEGATIVE mg/dL
Leukocytes, UA: NEGATIVE
Nitrite: NEGATIVE
Protein, ur: NEGATIVE mg/dL
Specific Gravity, Urine: 1.009 (ref 1.005–1.030)
Urobilinogen, UA: 0.2 mg/dL (ref 0.0–1.0)
pH: 6.5 (ref 5.0–8.0)

## 2012-03-29 LAB — PULMONARY FUNCTION TEST

## 2012-03-29 LAB — CBC
HCT: 38.5 % — ABNORMAL LOW (ref 39.0–52.0)
Hemoglobin: 13 g/dL (ref 13.0–17.0)
MCH: 26.9 pg (ref 26.0–34.0)
MCHC: 33.8 g/dL (ref 30.0–36.0)
MCV: 79.7 fL (ref 78.0–100.0)
Platelets: 178 10*3/uL (ref 150–400)
RBC: 4.83 MIL/uL (ref 4.22–5.81)
RDW: 14 % (ref 11.5–15.5)
WBC: 6.5 10*3/uL (ref 4.0–10.5)

## 2012-03-29 LAB — HEMOGLOBIN A1C
Hgb A1c MFr Bld: 5.8 % — ABNORMAL HIGH (ref <5.7)
Mean Plasma Glucose: 120 mg/dL — ABNORMAL HIGH (ref <117)

## 2012-03-29 LAB — TYPE AND SCREEN
ABO/RH(D): A POS
Antibody Screen: NEGATIVE

## 2012-03-29 LAB — APTT: aPTT: 31 seconds (ref 24–37)

## 2012-03-29 LAB — PROTIME-INR
INR: 0.98 (ref 0.00–1.49)
Prothrombin Time: 13.2 seconds (ref 11.6–15.2)

## 2012-03-29 LAB — SURGICAL PCR SCREEN
MRSA, PCR: NEGATIVE
Staphylococcus aureus: NEGATIVE

## 2012-03-29 MED ORDER — CHLORHEXIDINE GLUCONATE 4 % EX LIQD: 30.0000 mL | CUTANEOUS | Status: DC

## 2012-03-29 MED ORDER — ALBUTEROL SULFATE (5 MG/ML) 0.5% IN NEBU
2.5000 mg | INHALATION_SOLUTION | Freq: Once | RESPIRATORY_TRACT | Status: AC
  Administered 2012-03-29: 2.5 mg via RESPIRATORY_TRACT

## 2012-03-29 NOTE — Telephone Encounter (Signed)
Patient was seen by Dr. Barry Dienes 03/28/12 @ 12 noon.

## 2012-03-29 NOTE — Pre-Procedure Instructions (Signed)
22 Curtis Perez  03/29/2012   Your procedure is scheduled on:  April 02, 2012 (TUESDAY)  Report to [facility] at 5:30 AM.  Call this number if you have problems the morning of surgery: [phone]   Remember:   Do not eat food:After Midnight.  May have clear liquids: up to 4 Hours before arrival.  Clear liquids include soda, tea, black coffee, apple or grape juice, broth.  Take these medicines the morning of surgery with A SIP OF WATER: ATENOLOL,  STOP NAPROXEN 4-9   Do not wear jewelry, make-up or nail polish.  Do not wear lotions, powders, or perfumes. You may wear deodorant.  Do not shave 48 hours prior to surgery.  Do not bring valuables to the hospital.  Contacts, dentures or bridgework may not be worn into surgery.  Leave suitcase in the car. After surgery it may be brought to your room.  For patients admitted to the hospital, checkout time is 11:00 AM the day of discharge.   Patients discharged the day of surgery will not be allowed to drive home.  Name and phone number of your driver: NA  Special Instructions: Incentive Spirometry - Practice and bring it with you on the day of surgery. and CHG Shower Use Special Wash: 1/2 bottle night before surgery and 1/2 bottle morning of surgery.   Please read over the following fact sheets that you were given: Pain Booklet, Blood Transfusion Information, Open Heart Packet, MRSA Information and Surgical Site Infection Prevention

## 2012-03-29 NOTE — Progress Notes (Signed)
Pre-op Cardiac Surgery  Carotid Findings:  Right= 40-59% ICA stenosis, low end of scale. Left= No significant ICA stenosis. Bilateral antegrade vertebral flow.  Upper Extremity Right Left  Brachial Pressures 160 161  Radial Waveforms Tri Tri  Ulnar Waveforms Tri Tri  Palmar Arch (Allen's Test) Waveform normal with radial compression, >50% with ulnar compression Waveform normal with radial compression, >50% with ulnar compression     Lower  Extremity Right Left  Dorsalis Pedis    Anterior Tibial 172 172  Posterior Tibial 170 176  Ankle/Brachial Indices 1.07 1.09    Findings:  Bilateral normal ABI study    Farrel Demark, RDMS

## 2012-04-01 MED ORDER — TRANEXAMIC ACID (OHS) BOLUS VIA INFUSION
15.0000 mg/kg | INTRAVENOUS | Status: DC
  Filled 2012-04-01: qty 1728

## 2012-04-01 MED ORDER — SODIUM CHLORIDE 0.9 % IV SOLN
1250.0000 mg | INTRAVENOUS | Status: AC
  Administered 2012-04-02: 1250 mg via INTRAVENOUS
  Filled 2012-04-01 (×2): qty 1250

## 2012-04-01 MED ORDER — METOPROLOL TARTRATE 12.5 MG HALF TABLET: 12.5000 mg | ORAL_TABLET | Freq: Once | ORAL | Status: DC

## 2012-04-01 MED ORDER — DEXTROSE 5 % IV SOLN
1.5000 g | INTRAVENOUS | Status: AC
  Administered 2012-04-02: 750 g via INTRAVENOUS
  Administered 2012-04-02: 1.5 g via INTRAVENOUS
  Filled 2012-04-01 (×2): qty 1.5

## 2012-04-01 MED ORDER — SODIUM BICARBONATE 8.4 % IV SOLN
INTRAVENOUS | Status: AC
  Administered 2012-04-02: 10:00:00
  Filled 2012-04-01: qty 2.5

## 2012-04-01 MED ORDER — TRANEXAMIC ACID 100 MG/ML IV SOLN
1.5000 mg/kg/h | INTRAVENOUS | Status: AC
  Administered 2012-04-02: 15 mg/kg/h via INTRAVENOUS
  Filled 2012-04-01: qty 25

## 2012-04-01 MED ORDER — DOPAMINE-DEXTROSE 3.2-5 MG/ML-% IV SOLN
2.0000 ug/kg/min | INTRAVENOUS | Status: DC
  Filled 2012-04-01: qty 250

## 2012-04-01 MED ORDER — SODIUM CHLORIDE 0.9 % IV SOLN
INTRAVENOUS | Status: AC
  Administered 2012-04-02: 1.3 [IU]/h via INTRAVENOUS
  Filled 2012-04-01: qty 1

## 2012-04-01 MED ORDER — TRANEXAMIC ACID (OHS) PUMP PRIME SOLUTION
2.0000 mg/kg | INTRAVENOUS | Status: DC
  Filled 2012-04-01: qty 2.3

## 2012-04-01 MED ORDER — DEXTROSE 5 % IV SOLN
30.0000 ug/min | INTRAVENOUS | Status: DC
  Administered 2012-04-02: 10 ug/min via INTRAVENOUS
  Filled 2012-04-01: qty 2

## 2012-04-01 MED ORDER — POTASSIUM CHLORIDE 2 MEQ/ML IV SOLN
80.0000 meq | INTRAVENOUS | Status: DC
  Filled 2012-04-01: qty 40

## 2012-04-01 MED ORDER — NITROGLYCERIN IN D5W 200-5 MCG/ML-% IV SOLN
2.0000 ug/min | INTRAVENOUS | Status: AC
  Administered 2012-04-02: 16 ug/min via INTRAVENOUS
  Filled 2012-04-01: qty 250

## 2012-04-01 MED ORDER — DEXTROSE 5 % IV SOLN
750.0000 mg | INTRAVENOUS | Status: DC
  Filled 2012-04-01: qty 750

## 2012-04-01 MED ORDER — MAGNESIUM SULFATE 50 % IJ SOLN
40.0000 meq | INTRAMUSCULAR | Status: DC
  Filled 2012-04-01: qty 10

## 2012-04-01 MED ORDER — EPINEPHRINE HCL 1 MG/ML IJ SOLN
0.5000 ug/min | INTRAVENOUS | Status: DC
  Filled 2012-04-01: qty 4

## 2012-04-01 MED ORDER — SODIUM CHLORIDE 0.9 % IV SOLN
0.1000 ug/kg/h | INTRAVENOUS | Status: AC
  Administered 2012-04-02: .2 ug/kg/h via INTRAVENOUS
  Filled 2012-04-01: qty 4

## 2012-04-01 NOTE — Telephone Encounter (Signed)
Pt aware of surgery dates, no further questions.

## 2012-04-02 ENCOUNTER — Encounter (HOSPITAL_COMMUNITY): Payer: Self-pay | Admitting: Certified Registered"

## 2012-04-02 ENCOUNTER — Ambulatory Visit (HOSPITAL_COMMUNITY): Payer: BC Managed Care – PPO | Admitting: Certified Registered"

## 2012-04-02 ENCOUNTER — Encounter (HOSPITAL_COMMUNITY): Payer: Self-pay | Admitting: Thoracic Surgery (Cardiothoracic Vascular Surgery)

## 2012-04-02 ENCOUNTER — Inpatient Hospital Stay (HOSPITAL_COMMUNITY): Payer: BC Managed Care – PPO

## 2012-04-02 ENCOUNTER — Encounter (HOSPITAL_COMMUNITY)
Admission: RE | Disposition: A | Discharge status: Skilled Nursing Facility | Payer: Self-pay | Source: Ambulatory Visit | Attending: Thoracic Surgery (Cardiothoracic Vascular Surgery)

## 2012-04-02 ENCOUNTER — Encounter (HOSPITAL_COMMUNITY): Payer: Self-pay | Admitting: *Deleted

## 2012-04-02 ENCOUNTER — Inpatient Hospital Stay (HOSPITAL_COMMUNITY)
Admission: RE | Admit: 2012-04-02 | Discharge: 2012-04-09 | DRG: 109 | Disposition: A | Discharge status: Skilled Nursing Facility | Payer: BC Managed Care – PPO | Source: Ambulatory Visit | Attending: Thoracic Surgery (Cardiothoracic Vascular Surgery) | Admitting: Thoracic Surgery (Cardiothoracic Vascular Surgery)

## 2012-04-02 DIAGNOSIS — F17200 Nicotine dependence, unspecified, uncomplicated: Secondary | ICD-10-CM | POA: Diagnosis present

## 2012-04-02 DIAGNOSIS — R079 Chest pain, unspecified: Secondary | ICD-10-CM

## 2012-04-02 DIAGNOSIS — I252 Old myocardial infarction: Secondary | ICD-10-CM

## 2012-04-02 DIAGNOSIS — Z951 Presence of aortocoronary bypass graft: Secondary | ICD-10-CM

## 2012-04-02 DIAGNOSIS — I2510 Atherosclerotic heart disease of native coronary artery without angina pectoris: Principal | ICD-10-CM

## 2012-04-02 DIAGNOSIS — I498 Other specified cardiac arrhythmias: Secondary | ICD-10-CM | POA: Diagnosis present

## 2012-04-02 DIAGNOSIS — Z7982 Long term (current) use of aspirin: Secondary | ICD-10-CM

## 2012-04-02 DIAGNOSIS — K560 Paralytic ileus: Secondary | ICD-10-CM | POA: Diagnosis not present

## 2012-04-02 DIAGNOSIS — D696 Thrombocytopenia, unspecified: Secondary | ICD-10-CM | POA: Diagnosis not present

## 2012-04-02 DIAGNOSIS — I10 Essential (primary) hypertension: Secondary | ICD-10-CM | POA: Diagnosis present

## 2012-04-02 DIAGNOSIS — Z9861 Coronary angioplasty status: Secondary | ICD-10-CM

## 2012-04-02 DIAGNOSIS — E8779 Other fluid overload: Secondary | ICD-10-CM | POA: Diagnosis not present

## 2012-04-02 DIAGNOSIS — J9819 Other pulmonary collapse: Secondary | ICD-10-CM | POA: Diagnosis not present

## 2012-04-02 DIAGNOSIS — D62 Acute posthemorrhagic anemia: Secondary | ICD-10-CM | POA: Diagnosis not present

## 2012-04-02 DIAGNOSIS — J90 Pleural effusion, not elsewhere classified: Secondary | ICD-10-CM | POA: Diagnosis not present

## 2012-04-02 DIAGNOSIS — E785 Hyperlipidemia, unspecified: Secondary | ICD-10-CM | POA: Diagnosis present

## 2012-04-02 DIAGNOSIS — I200 Unstable angina: Secondary | ICD-10-CM | POA: Diagnosis present

## 2012-04-02 HISTORY — DX: Presence of aortocoronary bypass graft: Z95.1

## 2012-04-02 HISTORY — PX: CORONARY ARTERY BYPASS GRAFT: SHX141

## 2012-04-02 LAB — POCT I-STAT, CHEM 8
BUN: 12 mg/dL (ref 6–23)
Calcium, Ion: 1.14 mmol/L (ref 1.12–1.32)
Chloride: 109 mEq/L (ref 96–112)
Creatinine, Ser: 0.9 mg/dL (ref 0.50–1.35)
Glucose, Bld: 130 mg/dL — ABNORMAL HIGH (ref 70–99)
HCT: 31 % — ABNORMAL LOW (ref 39.0–52.0)
Hemoglobin: 10.5 g/dL — ABNORMAL LOW (ref 13.0–17.0)
Potassium: 4 mEq/L (ref 3.5–5.1)
Sodium: 134 mEq/L — ABNORMAL LOW (ref 135–145)
TCO2: 22 mmol/L (ref 0–100)

## 2012-04-02 LAB — GLUCOSE, CAPILLARY
Glucose-Capillary: 108 mg/dL — ABNORMAL HIGH (ref 70–99)
Glucose-Capillary: 99 mg/dL (ref 70–99)
Glucose-Capillary: 123 mg/dL — ABNORMAL HIGH (ref 70–99)

## 2012-04-02 LAB — POCT I-STAT 3, ART BLOOD GAS (G3+)
Acid-base deficit: 2 mmol/L (ref 0.0–2.0)
Acid-base deficit: 2 mmol/L (ref 0.0–2.0)
Acid-base deficit: 2 mmol/L (ref 0.0–2.0)
Bicarbonate: 25 mEq/L — ABNORMAL HIGH (ref 20.0–24.0)
Bicarbonate: 24.8 mEq/L — ABNORMAL HIGH (ref 20.0–24.0)
Bicarbonate: 23.4 mEq/L (ref 20.0–24.0)
O2 Saturation: 95 %
O2 Saturation: 99 %
O2 Saturation: 98 %
Patient temperature: 36
Patient temperature: 36.2
Patient temperature: 36.2
TCO2: 27 mmol/L (ref 0–100)
TCO2: 26 mmol/L (ref 0–100)
TCO2: 25 mmol/L (ref 0–100)
pCO2 arterial: 54.1 mmHg — ABNORMAL HIGH (ref 35.0–45.0)
pCO2 arterial: 48.9 mmHg — ABNORMAL HIGH (ref 35.0–45.0)
pCO2 arterial: 42.8 mmHg (ref 35.0–45.0)
pH, Arterial: 7.268 — ABNORMAL LOW (ref 7.350–7.450)
pH, Arterial: 7.309 — ABNORMAL LOW (ref 7.350–7.450)
pH, Arterial: 7.343 — ABNORMAL LOW (ref 7.350–7.450)
pO2, Arterial: 85 mmHg (ref 80.0–100.0)
pO2, Arterial: 140 mmHg — ABNORMAL HIGH (ref 80.0–100.0)
pO2, Arterial: 115 mmHg — ABNORMAL HIGH (ref 80.0–100.0)

## 2012-04-02 LAB — CBC
HCT: 27.5 % — ABNORMAL LOW (ref 39.0–52.0)
HCT: 30.4 % — ABNORMAL LOW (ref 39.0–52.0)
Hemoglobin: 9.3 g/dL — ABNORMAL LOW (ref 13.0–17.0)
Hemoglobin: 10.5 g/dL — ABNORMAL LOW (ref 13.0–17.0)
MCH: 27.4 pg (ref 26.0–34.0)
MCH: 27.8 pg (ref 26.0–34.0)
MCHC: 33.8 g/dL (ref 30.0–36.0)
MCHC: 34.5 g/dL (ref 30.0–36.0)
MCV: 80.9 fL (ref 78.0–100.0)
MCV: 80.4 fL (ref 78.0–100.0)
Platelets: 137 10*3/uL — ABNORMAL LOW (ref 150–400)
Platelets: 135 10*3/uL — ABNORMAL LOW (ref 150–400)
RBC: 3.4 MIL/uL — ABNORMAL LOW (ref 4.22–5.81)
RBC: 3.78 MIL/uL — ABNORMAL LOW (ref 4.22–5.81)
RDW: 14.5 % (ref 11.5–15.5)
RDW: 14.6 % (ref 11.5–15.5)
WBC: 8.7 10*3/uL (ref 4.0–10.5)
WBC: 12.1 10*3/uL — ABNORMAL HIGH (ref 4.0–10.5)

## 2012-04-02 LAB — APTT: aPTT: 38 seconds — ABNORMAL HIGH (ref 24–37)

## 2012-04-02 LAB — POCT I-STAT 4, (NA,K, GLUC, HGB,HCT)
Glucose, Bld: 111 mg/dL — ABNORMAL HIGH (ref 70–99)
HCT: 26 % — ABNORMAL LOW (ref 39.0–52.0)
Hemoglobin: 8.8 g/dL — ABNORMAL LOW (ref 13.0–17.0)
Potassium: 4.5 mEq/L (ref 3.5–5.1)
Sodium: 138 mEq/L (ref 135–145)

## 2012-04-02 LAB — PLATELET COUNT: Platelets: 189 10*3/uL (ref 150–400)

## 2012-04-02 LAB — PROTIME-INR
INR: 1.39 (ref 0.00–1.49)
Prothrombin Time: 17.3 seconds — ABNORMAL HIGH (ref 11.6–15.2)

## 2012-04-02 LAB — CREATININE, SERUM
Creatinine, Ser: 0.9 mg/dL (ref 0.50–1.35)
GFR calc Af Amer: >90 mL/min (ref >90)
GFR calc non Af Amer: >90 mL/min (ref >90)

## 2012-04-02 LAB — HEMOGLOBIN AND HEMATOCRIT, BLOOD
HCT: 27.1 % — ABNORMAL LOW (ref 39.0–52.0)
Hemoglobin: 9.4 g/dL — ABNORMAL LOW (ref 13.0–17.0)

## 2012-04-02 LAB — MAGNESIUM: Magnesium: 2.8 mg/dL — ABNORMAL HIGH (ref 1.5–2.5)

## 2012-04-02 SURGERY — CORONARY ARTERY BYPASS GRAFTING (CABG)
Anesthesia: General | Site: Chest | Wound class: Clean

## 2012-04-02 MED ORDER — EPHEDRINE SULFATE 50 MG/ML IJ SOLN
INTRAMUSCULAR | Status: DC | PRN
  Administered 2012-04-02 (×2): 10 mg via INTRAVENOUS

## 2012-04-02 MED ORDER — ALBUMIN HUMAN 5 % IV SOLN
250.0000 mL | INTRAVENOUS | Status: DC | PRN
  Administered 2012-04-02: 250 mL via INTRAVENOUS

## 2012-04-02 MED ORDER — MIDAZOLAM HCL 2 MG/2ML IJ SOLN: 2.0000 mg | INTRAMUSCULAR | Status: DC | PRN

## 2012-04-02 MED ORDER — SODIUM CHLORIDE 0.9 % IJ SOLN: 3.0000 mL | INTRAMUSCULAR | Status: DC | PRN

## 2012-04-02 MED ORDER — PROTAMINE SULFATE 10 MG/ML IV SOLN
INTRAVENOUS | Status: DC | PRN
  Administered 2012-04-02: 33 mg via INTRAVENOUS

## 2012-04-02 MED ORDER — PANTOPRAZOLE SODIUM 40 MG PO TBEC
40.0000 mg | DELAYED_RELEASE_TABLET | Freq: Every day | ORAL | Status: DC
  Administered 2012-04-04 – 2012-04-09 (×5): 40 mg via ORAL
  Filled 2012-04-02 (×4): qty 1

## 2012-04-02 MED ORDER — MORPHINE SULFATE 4 MG/ML IJ SOLN
2.0000 mg | INTRAMUSCULAR | Status: DC | PRN
  Administered 2012-04-03: 4 mg via INTRAVENOUS
  Filled 2012-04-02: qty 1

## 2012-04-02 MED ORDER — DEXTROSE 5 % IV SOLN
1.5000 g | Freq: Two times a day (BID) | INTRAVENOUS | Status: DC
  Administered 2012-04-03: 1.5 g via INTRAVENOUS
  Filled 2012-04-02 (×2): qty 1.5

## 2012-04-02 MED ORDER — LACTATED RINGERS IV SOLN
INTRAVENOUS | Status: DC | PRN
  Administered 2012-04-02: 07:00:00 via INTRAVENOUS

## 2012-04-02 MED ORDER — INSULIN ASPART 100 UNIT/ML ~~LOC~~ SOLN: 0.0000 [IU] | SUBCUTANEOUS | Status: DC

## 2012-04-02 MED ORDER — ROCURONIUM BROMIDE 100 MG/10ML IV SOLN
INTRAVENOUS | Status: DC | PRN
  Administered 2012-04-02: 70 mg via INTRAVENOUS
  Administered 2012-04-02: 30 mg via INTRAVENOUS

## 2012-04-02 MED ORDER — SODIUM CHLORIDE 0.9 % IJ SOLN: 3.0000 mL | Freq: Two times a day (BID) | INTRAMUSCULAR | Status: DC

## 2012-04-02 MED ORDER — ONDANSETRON HCL 4 MG/2ML IJ SOLN
4.0000 mg | Freq: Four times a day (QID) | INTRAMUSCULAR | Status: DC | PRN
  Administered 2012-04-02: 4 mg via INTRAVENOUS
  Filled 2012-04-02: qty 2

## 2012-04-02 MED ORDER — HEPARIN SODIUM (PORCINE) 1000 UNIT/ML IJ SOLN
INTRAMUSCULAR | Status: DC | PRN
  Administered 2012-04-02: 42000 [IU] via INTRAVENOUS
  Administered 2012-04-02: 2000 [IU] via INTRAVENOUS

## 2012-04-02 MED ORDER — VITAMIN B-12 1000 MCG PO TABS
1000.0000 ug | ORAL_TABLET | Freq: Every day | ORAL | Status: DC
  Administered 2012-04-03 – 2012-04-09 (×7): 1000 ug via ORAL
  Filled 2012-04-02 (×7): qty 1

## 2012-04-02 MED ORDER — MIDAZOLAM HCL 5 MG/5ML IJ SOLN
INTRAMUSCULAR | Status: DC | PRN
  Administered 2012-04-02 (×2): 2 mg via INTRAVENOUS
  Administered 2012-04-02: 5 mg via INTRAVENOUS
  Administered 2012-04-02: 2 mg via INTRAVENOUS
  Administered 2012-04-02: 1 mg via INTRAVENOUS

## 2012-04-02 MED ORDER — SODIUM CHLORIDE 0.9 % IV SOLN: 250.0000 mL | INTRAVENOUS | Status: DC

## 2012-04-02 MED ORDER — GLYCOPYRROLATE 0.2 MG/ML IJ SOLN
INTRAMUSCULAR | Status: DC | PRN
  Administered 2012-04-02 (×2): 0.1 mg via INTRAVENOUS

## 2012-04-02 MED ORDER — SODIUM CHLORIDE 0.9 % IV SOLN: INTRAVENOUS | Status: DC

## 2012-04-02 MED ORDER — METOPROLOL TARTRATE 25 MG/10 ML ORAL SUSPENSION
12.5000 mg | Freq: Two times a day (BID) | ORAL | Status: DC
  Filled 2012-04-02 (×3): qty 5

## 2012-04-02 MED ORDER — MAGNESIUM SULFATE 40 MG/ML IJ SOLN
4.0000 g | Freq: Once | INTRAMUSCULAR | Status: AC
  Administered 2012-04-02: 4 g via INTRAVENOUS
  Filled 2012-04-02: qty 100

## 2012-04-02 MED ORDER — SODIUM CHLORIDE 0.9 % IV SOLN
0.1000 ug/kg/h | INTRAVENOUS | Status: DC
  Filled 2012-04-02: qty 2

## 2012-04-02 MED ORDER — ACETAMINOPHEN 650 MG RE SUPP
650.0000 mg | RECTAL | Status: AC
  Administered 2012-04-02: 650 mg via RECTAL

## 2012-04-02 MED ORDER — OXYCODONE HCL 5 MG PO TABS
5.0000 mg | ORAL_TABLET | ORAL | Status: DC | PRN
  Administered 2012-04-03: 5 mg via ORAL
  Administered 2012-04-03: 10 mg via ORAL
  Administered 2012-04-03: 5 mg via ORAL
  Administered 2012-04-03 – 2012-04-08 (×9): 10 mg via ORAL
  Filled 2012-04-02 (×3): qty 2
  Filled 2012-04-02: qty 1
  Filled 2012-04-02: qty 2
  Filled 2012-04-02: qty 1
  Filled 2012-04-02 (×6): qty 2

## 2012-04-02 MED ORDER — ACETAMINOPHEN 500 MG PO TABS
1000.0000 mg | ORAL_TABLET | Freq: Four times a day (QID) | ORAL | Status: AC
  Administered 2012-04-02 – 2012-04-06 (×15): 1000 mg via ORAL
  Administered 2012-04-06: 500 mg via ORAL
  Administered 2012-04-07 (×2): 1000 mg via ORAL
  Filled 2012-04-02 (×20): qty 2

## 2012-04-02 MED ORDER — SODIUM CHLORIDE 0.45 % IV SOLN: INTRAVENOUS | Status: DC

## 2012-04-02 MED ORDER — ASPIRIN EC 325 MG PO TBEC
325.0000 mg | DELAYED_RELEASE_TABLET | Freq: Every day | ORAL | Status: DC
  Filled 2012-04-02: qty 1

## 2012-04-02 MED ORDER — BISACODYL 10 MG RE SUPP: 10.0000 mg | Freq: Every day | RECTAL | Status: DC

## 2012-04-02 MED ORDER — TRANEXAMIC ACID 100 MG/ML IV SOLN
1.5000 mg/kg/h | INTRAVENOUS | Status: DC
  Filled 2012-04-02 (×2): qty 25

## 2012-04-02 MED ORDER — SODIUM CHLORIDE 0.9 % IV SOLN
INTRAVENOUS | Status: DC
  Filled 2012-04-02: qty 1

## 2012-04-02 MED ORDER — PHENYLEPHRINE HCL 10 MG/ML IJ SOLN
0.0000 ug/min | INTRAVENOUS | Status: DC
  Filled 2012-04-02: qty 2

## 2012-04-02 MED ORDER — NITROGLYCERIN IN D5W 200-5 MCG/ML-% IV SOLN: 0.0000 ug/min | INTRAVENOUS | Status: DC

## 2012-04-02 MED ORDER — ALBUMIN HUMAN 5 % IV SOLN
INTRAVENOUS | Status: DC | PRN
  Administered 2012-04-02 (×2): via INTRAVENOUS

## 2012-04-02 MED ORDER — PROPOFOL 10 MG/ML IV EMUL
INTRAVENOUS | Status: DC | PRN
  Administered 2012-04-02: 30 mg via INTRAVENOUS

## 2012-04-02 MED ORDER — BISACODYL 5 MG PO TBEC
10.0000 mg | DELAYED_RELEASE_TABLET | Freq: Every day | ORAL | Status: DC
  Administered 2012-04-03 – 2012-04-06 (×3): 10 mg via ORAL
  Administered 2012-04-08: 5 mg via ORAL
  Filled 2012-04-02 (×3): qty 2

## 2012-04-02 MED ORDER — INSULIN ASPART 100 UNIT/ML ~~LOC~~ SOLN
0.0000 [IU] | SUBCUTANEOUS | Status: AC
  Administered 2012-04-02 (×2): 2 [IU] via SUBCUTANEOUS

## 2012-04-02 MED ORDER — 0.9 % SODIUM CHLORIDE (POUR BTL) OPTIME
TOPICAL | Status: DC | PRN
  Administered 2012-04-02: 6000 mL

## 2012-04-02 MED ORDER — ACETAMINOPHEN 160 MG/5ML PO SOLN: 650.0000 mg | ORAL | Status: AC

## 2012-04-02 MED ORDER — ASPIRIN 81 MG PO CHEW: 324.0000 mg | CHEWABLE_TABLET | Freq: Every day | ORAL | Status: DC

## 2012-04-02 MED ORDER — INSULIN REGULAR BOLUS VIA INFUSION
0.0000 [IU] | Freq: Three times a day (TID) | INTRAVENOUS | Status: DC
  Filled 2012-04-02: qty 10

## 2012-04-02 MED ORDER — LACTATED RINGERS IV SOLN: INTRAVENOUS | Status: DC

## 2012-04-02 MED ORDER — FAMOTIDINE IN NACL 20-0.9 MG/50ML-% IV SOLN
20.0000 mg | Freq: Two times a day (BID) | INTRAVENOUS | Status: DC
  Administered 2012-04-02: 20 mg via INTRAVENOUS

## 2012-04-02 MED ORDER — SIMVASTATIN 20 MG PO TABS
20.0000 mg | ORAL_TABLET | Freq: Every day | ORAL | Status: DC
  Administered 2012-04-02 – 2012-04-08 (×7): 20 mg via ORAL
  Filled 2012-04-02 (×8): qty 1

## 2012-04-02 MED ORDER — ACETAMINOPHEN 160 MG/5ML PO SOLN
975.0000 mg | Freq: Four times a day (QID) | ORAL | Status: AC
  Administered 2012-04-05 (×2): 975 mg
  Filled 2012-04-02: qty 40.6

## 2012-04-02 MED ORDER — DOCUSATE SODIUM 100 MG PO CAPS
200.0000 mg | ORAL_CAPSULE | Freq: Every day | ORAL | Status: DC
  Administered 2012-04-03 – 2012-04-08 (×6): 200 mg via ORAL
  Filled 2012-04-02 (×7): qty 2

## 2012-04-02 MED ORDER — POTASSIUM CHLORIDE 10 MEQ/50ML IV SOLN: 10.0000 meq | INTRAVENOUS | Status: AC

## 2012-04-02 MED ORDER — METOPROLOL TARTRATE 12.5 MG HALF TABLET
12.5000 mg | ORAL_TABLET | Freq: Two times a day (BID) | ORAL | Status: DC
  Filled 2012-04-02 (×3): qty 1

## 2012-04-02 MED ORDER — VANCOMYCIN HCL 1000 MG IV SOLR
1000.0000 mg | Freq: Once | INTRAVENOUS | Status: AC
  Administered 2012-04-02: 1000 mg via INTRAVENOUS
  Filled 2012-04-02: qty 1000

## 2012-04-02 MED ORDER — VECURONIUM BROMIDE 10 MG IV SOLR
INTRAVENOUS | Status: DC | PRN
  Administered 2012-04-02 (×4): 4 mg via INTRAVENOUS

## 2012-04-02 MED ORDER — CALCIUM CHLORIDE 10 % IV SOLN
1.0000 g | Freq: Once | INTRAVENOUS | Status: AC | PRN
  Filled 2012-04-02: qty 10

## 2012-04-02 MED ORDER — FENTANYL CITRATE 0.05 MG/ML IJ SOLN
INTRAMUSCULAR | Status: DC | PRN
  Administered 2012-04-02: 250 ug via INTRAVENOUS
  Administered 2012-04-02: 50 ug via INTRAVENOUS
  Administered 2012-04-02: 150 ug via INTRAVENOUS
  Administered 2012-04-02: 75 ug via INTRAVENOUS
  Administered 2012-04-02: 175 ug via INTRAVENOUS
  Administered 2012-04-02: 50 ug via INTRAVENOUS
  Administered 2012-04-02: 100 ug via INTRAVENOUS
  Administered 2012-04-02: 150 ug via INTRAVENOUS
  Administered 2012-04-02: 50 ug via INTRAVENOUS
  Administered 2012-04-02 (×2): 100 ug via INTRAVENOUS
  Administered 2012-04-02: 600 ug via INTRAVENOUS

## 2012-04-02 MED ORDER — METOPROLOL TARTRATE 1 MG/ML IV SOLN: 2.5000 mg | INTRAVENOUS | Status: DC | PRN

## 2012-04-02 MED ORDER — SODIUM CHLORIDE 0.9 % IJ SOLN
OROMUCOSAL | Status: DC | PRN
  Administered 2012-04-02: 10:00:00 via TOPICAL

## 2012-04-02 MED ORDER — MORPHINE SULFATE 2 MG/ML IJ SOLN
1.0000 mg | INTRAMUSCULAR | Status: AC | PRN
  Administered 2012-04-02 (×2): 2 mg via INTRAVENOUS
  Filled 2012-04-02 (×2): qty 1

## 2012-04-02 SURGICAL SUPPLY — 116 items
ADAPTER CARDIO PERF ANTE/RETRO (ADAPTER) IMPLANT
APPLIER CLIP 9.375 MED OPEN (MISCELLANEOUS)
APPLIER CLIP 9.375 SM OPEN (CLIP) ×2
ATTRACTOMAT 16X20 MAGNETIC DRP (DRAPES) ×2 IMPLANT
BAG DECANTER FOR FLEXI CONT (MISCELLANEOUS) ×2 IMPLANT
BANDAGE ELASTIC 4 VELCRO ST LF (GAUZE/BANDAGES/DRESSINGS) ×2 IMPLANT
BANDAGE ELASTIC 6 VELCRO ST LF (GAUZE/BANDAGES/DRESSINGS) ×2 IMPLANT
BANDAGE GAUZE ELAST BULKY 4 IN (GAUZE/BANDAGES/DRESSINGS) ×2 IMPLANT
BASKET HEART (ORDER IN 25'S) (MISCELLANEOUS) ×1
BASKET HEART (ORDER IN 25S) (MISCELLANEOUS) ×1 IMPLANT
BENZOIN TINCTURE PRP APPL 2/3 (GAUZE/BANDAGES/DRESSINGS) ×2 IMPLANT
BLADE STERNUM SYSTEM 6 (BLADE) ×2 IMPLANT
BLADE SURG ROTATE 9660 (MISCELLANEOUS) IMPLANT
CANISTER SUCTION 2500CC (MISCELLANEOUS) ×2 IMPLANT
CANNULA GUNDRY RCSP 15FR (MISCELLANEOUS) IMPLANT
CATH CPB KIT OWEN (MISCELLANEOUS) ×2 IMPLANT
CATH ROBINSON RED A/P 18FR (CATHETERS) ×4 IMPLANT
CATH THORACIC 28FR (CATHETERS) IMPLANT
CATH THORACIC 28FR RT ANG (CATHETERS) IMPLANT
CATH THORACIC 36FR (CATHETERS) ×2 IMPLANT
CATH THORACIC 36FR RT ANG (CATHETERS) ×2 IMPLANT
CLIP APPLIE 9.375 MED OPEN (MISCELLANEOUS) IMPLANT
CLIP APPLIE 9.375 SM OPEN (CLIP) ×1 IMPLANT
CLIP FOGARTY SPRING 6M (CLIP) IMPLANT
CLIP RETRACTION 3.0MM CORONARY (MISCELLANEOUS) ×2 IMPLANT
CLIP TI MEDIUM 24 (CLIP) IMPLANT
CLIP TI WIDE RED SMALL 24 (CLIP) IMPLANT
CLOTH BEACON ORANGE TIMEOUT ST (SAFETY) ×2 IMPLANT
CONN Y 3/8X3/8X3/8  BEN (MISCELLANEOUS)
CONN Y 3/8X3/8X3/8 BEN (MISCELLANEOUS) IMPLANT
COVER SURGICAL LIGHT HANDLE (MISCELLANEOUS) ×4 IMPLANT
CRADLE DONUT ADULT HEAD (MISCELLANEOUS) ×2 IMPLANT
DERMABOND ADVANCED (GAUZE/BANDAGES/DRESSINGS) ×1
DERMABOND ADVANCED .7 DNX12 (GAUZE/BANDAGES/DRESSINGS) ×1 IMPLANT
DRAIN CHANNEL 32F RND 10.7 FF (WOUND CARE) ×2 IMPLANT
DRAPE CARDIOVASCULAR INCISE (DRAPES) ×1
DRAPE SLUSH/WARMER DISC (DRAPES) ×2 IMPLANT
DRAPE SRG 135X102X78XABS (DRAPES) ×1 IMPLANT
DRSG COVADERM 4X14 (GAUZE/BANDAGES/DRESSINGS) ×2 IMPLANT
ELECT REM PT RETURN 9FT ADLT (ELECTROSURGICAL) ×4
ELECTRODE REM PT RTRN 9FT ADLT (ELECTROSURGICAL) ×2 IMPLANT
GLOVE BIO SURGEON STRL SZ 6 (GLOVE) ×6 IMPLANT
GLOVE BIO SURGEON STRL SZ 6.5 (GLOVE) ×10 IMPLANT
GLOVE BIO SURGEON STRL SZ7 (GLOVE) IMPLANT
GLOVE BIO SURGEON STRL SZ7.5 (GLOVE) ×10 IMPLANT
GLOVE BIOGEL PI IND STRL 6 (GLOVE) ×3 IMPLANT
GLOVE BIOGEL PI IND STRL 6.5 (GLOVE) IMPLANT
GLOVE BIOGEL PI IND STRL 7.0 (GLOVE) IMPLANT
GLOVE BIOGEL PI INDICATOR 6 (GLOVE) ×3
GLOVE BIOGEL PI INDICATOR 6.5 (GLOVE)
GLOVE BIOGEL PI INDICATOR 7.0 (GLOVE)
GLOVE EUDERMIC 7 POWDERFREE (GLOVE) IMPLANT
GLOVE ORTHO TXT STRL SZ7.5 (GLOVE) ×4 IMPLANT
GOWN STRL NON-REIN LRG LVL3 (GOWN DISPOSABLE) ×24 IMPLANT
HEMOSTAT POWDER SURGIFOAM 1G (HEMOSTASIS) ×6 IMPLANT
INSERT FOGARTY 61MM (MISCELLANEOUS) IMPLANT
INSERT FOGARTY XLG (MISCELLANEOUS) ×2 IMPLANT
KIT BASIN OR (CUSTOM PROCEDURE TRAY) ×2 IMPLANT
KIT ROOM TURNOVER OR (KITS) ×2 IMPLANT
KIT SUCTION CATH 14FR (SUCTIONS) ×2 IMPLANT
KIT VASOVIEW W/TROCAR VH 2000 (KITS) ×2 IMPLANT
LEAD PACING MYOCARDI (MISCELLANEOUS) ×2 IMPLANT
MARKER GRAFT CORONARY BYPASS (MISCELLANEOUS) ×6 IMPLANT
NS IRRIG 1000ML POUR BTL (IV SOLUTION) ×10 IMPLANT
PACK OPEN HEART (CUSTOM PROCEDURE TRAY) ×2 IMPLANT
PAD ARMBOARD 7.5X6 YLW CONV (MISCELLANEOUS) ×2 IMPLANT
PENCIL BUTTON HOLSTER BLD 10FT (ELECTRODE) ×2 IMPLANT
PUNCH AORTIC ROTATE 4.0MM (MISCELLANEOUS) IMPLANT
PUNCH AORTIC ROTATE 4.5MM 8IN (MISCELLANEOUS) ×2 IMPLANT
PUNCH AORTIC ROTATE 5MM 8IN (MISCELLANEOUS) IMPLANT
SET CARDIOPLEGIA MPS 5001102 (MISCELLANEOUS) ×2 IMPLANT
SOLUTION ANTI FOG 6CC (MISCELLANEOUS) IMPLANT
SPONGE GAUZE 4X4 12PLY (GAUZE/BANDAGES/DRESSINGS) ×4 IMPLANT
SPONGE LAP 18X18 X RAY DECT (DISPOSABLE) ×8 IMPLANT
SPONGE LAP 4X18 X RAY DECT (DISPOSABLE) IMPLANT
SUT BONE WAX W31G (SUTURE) ×2 IMPLANT
SUT ETHIBOND X763 2 0 SH 1 (SUTURE) ×4 IMPLANT
SUT MNCRL AB 3-0 PS2 18 (SUTURE) ×4 IMPLANT
SUT MNCRL AB 4-0 PS2 18 (SUTURE) IMPLANT
SUT PDS AB 1 CTX 36 (SUTURE) ×4 IMPLANT
SUT PROLENE 2 0 SH DA (SUTURE) IMPLANT
SUT PROLENE 3 0 SH DA (SUTURE) ×2 IMPLANT
SUT PROLENE 3 0 SH1 36 (SUTURE) IMPLANT
SUT PROLENE 4 0 RB 1 (SUTURE) ×2
SUT PROLENE 4 0 SH DA (SUTURE) IMPLANT
SUT PROLENE 4-0 RB1 .5 CRCL 36 (SUTURE) ×2 IMPLANT
SUT PROLENE 5 0 C 1 36 (SUTURE) IMPLANT
SUT PROLENE 6 0 C 1 30 (SUTURE) ×10 IMPLANT
SUT PROLENE 7.0 RB 3 (SUTURE) ×8 IMPLANT
SUT PROLENE 8 0 BV175 6 (SUTURE) ×8 IMPLANT
SUT PROLENE BLUE 7 0 (SUTURE) ×4 IMPLANT
SUT PROLENE POLY MONO (SUTURE) IMPLANT
SUT SILK  1 MH (SUTURE) ×1
SUT SILK 1 MH (SUTURE) ×1 IMPLANT
SUT STEEL 6MS V (SUTURE) IMPLANT
SUT STEEL STERNAL CCS#1 18IN (SUTURE) IMPLANT
SUT STEEL SZ 6 DBL 3X14 BALL (SUTURE) ×6 IMPLANT
SUT VIC AB 1 CTX 36 (SUTURE)
SUT VIC AB 1 CTX36XBRD ANBCTR (SUTURE) IMPLANT
SUT VIC AB 2-0 CT1 27 (SUTURE) ×1
SUT VIC AB 2-0 CT1 TAPERPNT 27 (SUTURE) ×1 IMPLANT
SUT VIC AB 2-0 CTX 27 (SUTURE) IMPLANT
SUT VIC AB 3-0 SH 27 (SUTURE)
SUT VIC AB 3-0 SH 27X BRD (SUTURE) IMPLANT
SUT VIC AB 3-0 X1 27 (SUTURE) ×2 IMPLANT
SUT VICRYL 4-0 PS2 18IN ABS (SUTURE) IMPLANT
SUTURE E-PAK OPEN HEART (SUTURE) ×2 IMPLANT
SYSTEM SAHARA CHEST DRAIN ATS (WOUND CARE) ×2 IMPLANT
TAPE CLOTH SURG 4X10 WHT LF (GAUZE/BANDAGES/DRESSINGS) ×2 IMPLANT
TOWEL OR 17X24 6PK STRL BLUE (TOWEL DISPOSABLE) ×4 IMPLANT
TOWEL OR 17X26 10 PK STRL BLUE (TOWEL DISPOSABLE) ×4 IMPLANT
TRAY FOLEY IC TEMP SENS 14FR (CATHETERS) ×2 IMPLANT
TUBE SUCT INTRACARD DLP 20F (MISCELLANEOUS) ×2 IMPLANT
TUBING INSUFFLATION 10FT LAP (TUBING) ×2 IMPLANT
UNDERPAD 30X30 INCONTINENT (UNDERPADS AND DIAPERS) ×2 IMPLANT
WATER STERILE IRR 1000ML POUR (IV SOLUTION) ×4 IMPLANT

## 2012-04-02 NOTE — Anesthesia Preprocedure Evaluation (Addendum)
Anesthesia Evaluation  Patient identified by MRN, date of birth, ID band Patient awake    Reviewed: Allergy & Precautions, H&P , NPO status , Patient's Chart, lab work & pertinent test results, reviewed documented beta blocker date and time   History of Anesthesia Complications Negative for: history of anesthetic complications  Airway Mallampati: II TM Distance: >3 FB Neck ROM: Full    Dental  (+) Poor Dentition and Dental Advisory Given   Pulmonary Current Smoker,  breath sounds clear to auscultation  Pulmonary exam normal       Cardiovascular hypertension, Pt. on medications + CAD (cath: severe 3 vessel disease, EF 55-60%, evidence of inferior MI) and + Past MI Rhythm:Regular Rate:Normal     Neuro/Psych negative neurological ROS  negative psych ROS   GI/Hepatic Neg liver ROS, GERD-  Controlled,  Endo/Other  negative endocrine ROS  Renal/GU negative Renal ROS     Musculoskeletal   Abdominal (+) + obese,   Peds  Hematology   Anesthesia Other Findings   Reproductive/Obstetrics                          Anesthesia Physical Anesthesia Plan  ASA: III  Anesthesia Plan: General   Post-op Pain Management:    Induction: Intravenous  Airway Management Planned: Oral ETT  Additional Equipment: Arterial line, PA Cath, TEE and Ultrasound Guidance Line Placement  Intra-op Plan:   Post-operative Plan: Post-operative intubation/ventilation  Informed Consent: I have reviewed the patients History and Physical, chart, labs and discussed the procedure including the risks, benefits and alternatives for the proposed anesthesia with the patient or authorized representative who has indicated his/her understanding and acceptance.   Dental advisory given  Plan Discussed with: CRNA and Surgeon  Anesthesia Plan Comments: (Plan routine monitors, A line, PA cath, TEE, GETA with post op ventilation)         Anesthesia Quick Evaluation

## 2012-04-02 NOTE — OR Nursing (Signed)
Time out conducted with PA prior to leg incision at 08:29. Leg incision at 08:34. Separate time out with Dr. Cornelius Moras at 09:15 prior to chest incision at 09:16.

## 2012-04-02 NOTE — Transfer of Care (Signed)
Immediate Anesthesia Transfer of Care Note  Patient: Curtis Perez  Procedure(s) Performed: Procedure(s) (LRB): CORONARY ARTERY BYPASS GRAFTING (CABG) (N/A)  Patient Location: SICU  Anesthesia Type: Generalsedated and Patient remains intubated per anesthesia plan  Level of Consciousness: awake  Airway & Oxygen Therapy: Patient remains intubated per anesthesia plan  Post-op Assessment: Report given to PACU RN, Post -op Vital signs reviewed and stable and Patient moving all extremities  Post vital signs: Reviewed and stable  Complications: No apparent anesthesia complications

## 2012-04-02 NOTE — Progress Notes (Signed)
UR COMPLETED  

## 2012-04-02 NOTE — Op Note (Signed)
CARDIOTHORACIC SURGERY OPERATIVE NOTE  Date of Procedure: 04/02/2012  Preoperative Diagnosis:   Severe 3-vessel Coronary Artery Disease  Postoperative Diagnosis:   Same  Procedure:    Coronary Artery Bypass Grafting x 3   Left Internal Mammary Artery to Distal Left Anterior Descending Coronary Artery  Saphenous Vein Graft to Left Posterior Descending Coronary Artery  Saphenous Vein Graft to First Obtuse Marginal Branch of Left Circumflex Coronary Artery  Endoscopic Vein Harvest from Right Thigh and Lower Leg  Surgeon: Salvatore Decent. Cornelius Moras, MD  Assistant: Gershon Crane, PA-C  Anesthesia: Jairo Ben, MD  Operative Findings:  Mild left ventricular systolic dysfunction  Good quality left internal mammary artery conduit  Good quality saphenous vein conduit  Fair to good quality target vessels for grafting     BRIEF CLINICAL NOTE AND INDICATIONS FOR SURGERY   Patient is a Curtis Perez with known history of coronary artery disease status post acute myocardial infarction in 2004. Other risk factors include long-standing tobacco abuse, hypertension, hyperlipidemia. Patient has had a recent acceleration of symptoms of chest discomfort that are somewhat atypical but characterized as tightness or pressure like pain in the chest which typically occurs after meals. He was seen in the office by Dr. Elease Hashimoto and underwent stress Myoview exam that was abnormal revealing signs of previous inferior wall myocardial infarction as well as anterior and septal reversible ischemia. The patient underwent elective cardiac catheterization last week demonstrating severe three-vessel coronary artery disease with mild left ventricular dysfunction. He was referred for possible surgical revascularization.   DETAILS OF THE OPERATIVE PROCEDURE  The patient is brought to the operating room on the above mentioned date and central monitoring was established by the anesthesia team  including placement of Swan-Ganz catheter and radial arterial line. The patient is placed in the supine position on the operating table.  Intravenous antibiotics are administered. General endotracheal anesthesia is induced uneventfully. A Foley catheter is placed.  Baseline transesophageal echocardiogram was performed.  Findings were notable for mild inferior wall hypokinesis.  The patient's chest, abdomen, both groins, and both lower extremities are prepared and draped in a sterile manner. A time out procedure is performed.  A median sternotomy incision was performed and the left internal mammary artery is dissected from the chest wall and prepared for bypass grafting. The left internal mammary artery is notably good quality conduit. Simultaneously, saphenous vein is obtained from the patient's right thigh using endoscopic vein harvest technique. The saphenous vein is notably good quality conduit. After removal of the saphenous vein, the small surgical incisions in the lower extremity are closed with absorbable suture. Following systemic heparinization, the left internal mammary artery was transected distally noted to have excellent flow.  The pericardium is opened. The ascending aorta is normal in appearance. The ascending aorta and the right atrium are cannulated for cardioplegia bypass.  Adequate heparinization is verified.    The entire pre-bypass portion of the operation was notable for stable hemodynamics.  Cardiopulmonary bypass was begun and the surface of the heart is inspected. Distal target vessels are selected for coronary artery bypass grafting. A cardioplegia cannula is placed in the ascending aorta.  A temperature probe was placed in the interventricular septum.  The patient is allowed to cool passively to [facility] systemic temperature.  The aortic cross clamp is applied and cold blood cardioplegia is delivered initially in an antegrade fashion through the aortic root.    Iced saline slush is  applied for topical hypothermia.  The initial cardioplegic  arrest is rapid with early diastolic arrest.  Repeat doses of cardioplegia are administered intermittently throughout the entire cross clamp portion of the operation through the aortic root and through subsequently placed vein grafts in order to maintain completely flat electrocardiogram and septal myocardial temperature below 15C.  Myocardial protection was felt to be excellent.  The following distal coronary artery bypass grafts were performed:   The left posterior descending branch of the right coronary artery was grafted using a reversed saphenous vein graft in an end-to-side fashion.  At the site of distal anastomosis the target vessel was fair quality and measured approximately 1.5 mm in diameter.  The first obtuse marginal branch of the left circumflex coronary artery was grafted using a reversed saphenous vein graft in an end-to-side fashion.  At the site of distal anastomosis the target vessel was good quality and measured approximately 1.8 mm in diameter.  The distal left anterior coronary artery was grafted with the left internal mammary artery in an end-to-side fashion.  At the site of distal anastomosis the target vessel was good quality and measured approximately 2.0 mm in diameter.   All proximal vein graft anastomoses were placed directly to the ascending aorta prior to removal of the aortic cross clamp.  The septal myocardial temperature rose rapidly after reperfusion of the left internal mammary artery graft.  The aortic cross clamp was removed after a total cross clamp time of 61 minutes.  All proximal and distal coronary anastomoses were inspected for hemostasis and appropriate graft orientation. Epicardial pacing wires are fixed to the right ventricular outflow tract and to the right atrial appendage. The patient is rewarmed to 37C temperature. The patient is weaned and disconnected from cardiopulmonary bypass.  The  patient's rhythm at separation from bypass was AV paced.  The patient was weaned from cardioplegic bypass without any inotropic support. Total cardiopulmonary bypass time for the operation was 77 minutes.  Followup transesophageal echocardiogram performed after separation from bypass revealed no changes from the preoperative exam.  The aortic and venous cannula were removed uneventfully. Protamine was administered to reverse the anticoagulation. The mediastinum and pleural space were inspected for hemostasis and irrigated with saline solution. The mediastinum and the left pleural space were drained using 3 chest tubes placed through separate stab incisions inferiorly.  The soft tissues anterior to the aorta were reapproximated loosely. The sternum is closed with double strength sternal wire. The soft tissues anterior to the sternum were closed in multiple layers and the skin is closed with a running subcuticular skin closure.   The post-bypass portion of the operation was notable for stable rhythm and hemodynamics.   No blood products were administered during the operation.  The patient tolerated the procedure well and is transported to the surgical intensive care in stable condition. There are no intraoperative complications. All sponge instrument and needle counts are verified correct at completion of the operation.    Salvatore Decent. Cornelius Moras MD

## 2012-04-02 NOTE — Procedures (Signed)
Extubation Procedure Note  Patient Details:   Name: Curtis Perez DOB: 01-Feb-1951 MRN: [id number]   Airway Documentation:     Evaluation  O2 sats: stable throughout Complications: No apparent complications Patient did tolerate procedure well. Bilateral Breath Sounds: Clear   Yes  Nif -24, VC 1400 ml.  Extubated per protocol.  Cuff leak positive.  No stridor noted.  Will continue to monitor.  Lysbeth Penner [facility] 04/02/2012, 4:48 PM

## 2012-04-02 NOTE — Anesthesia Procedure Notes (Addendum)
Procedure Name: Intubation Date/Time: 04/02/2012 7:51 AM Performed by: Jerilee Hoh Pre-anesthesia Checklist: Patient identified, Emergency Drugs available, Suction available and Patient being monitored Patient Re-evaluated:Patient Re-evaluated prior to inductionOxygen Delivery Method: Circle system utilized Preoxygenation: Pre-oxygenation with 100% oxygen Intubation Type: IV induction Ventilation: Mask ventilation without difficulty Laryngoscope Size: Mac and 4 Grade View: Grade II Tube type: Oral Tube size: 8.0 mm Number of attempts: 1 Airway Equipment and Method: Stylet Placement Confirmation: positive ETCO2,  CO2 detector and breath sounds checked- equal and bilateral Secured at: 23 cm Tube secured with: Tape Dental Injury: Teeth and Oropharynx as per pre-operative assessment

## 2012-04-02 NOTE — Interval H&P Note (Signed)
History and Physical Interval Note:  04/02/2012 7:28 AM  Curtis Perez  has presented today for surgery, with the diagnosis of CAD  The various methods of treatment have been discussed with the patient and family. After consideration of risks, benefits and other options for treatment, the patient has consented to  Procedure(s) (LRB): CORONARY ARTERY BYPASS GRAFTING (CABG) (N/A) as a surgical intervention .  The patients' history has been reviewed, patient examined, no change in status, stable for surgery.  I have reviewed the patients' chart and labs.  Questions were answered to the patient's satisfaction.     Suhaib Guzzo H

## 2012-04-02 NOTE — Anesthesia Postprocedure Evaluation (Signed)
  Anesthesia Post-op Note  Patient: Curtis Perez  Procedure(s) Performed: Procedure(s) (LRB): CORONARY ARTERY BYPASS GRAFTING (CABG) (N/A)  Patient Location: SICU  Anesthesia Type: General  Level of Consciousness: sedated and Patient remains intubated per anesthesia plan  Airway and Oxygen Therapy: Patient remains intubated per anesthesia plan  Post-op Pain: mild  Post-op Assessment: Post-op Vital signs reviewed, Patient's Cardiovascular Status Stable, Respiratory Function Stable, Patent Airway and No signs of Nausea or vomiting  Post-op Vital Signs: Reviewed and stable  Complications: No apparent anesthesia complications

## 2012-04-02 NOTE — Brief Op Note (Addendum)
                   [address]            [place] [address]          [phone]    04/02/2012  11:14 AM  PATIENT:  Curtis Perez  61 y.o. male  PRE-OPERATIVE DIAGNOSIS:  Coronary Artery Disease  POST-OPERATIVE DIAGNOSIS:  Coronary Artery Disease  PROCEDURE:  Procedure(s): CORONARY ARTERY BYPASS GRAFTING (CABG)X 3 (LIMA-LAD; SVG-OM; SVG-PD)  SURGEON:  Surgeon(s): Purcell Nails, MD  PHYSICIAN ASSISTANT: WAYNE GOLD PA-C  ANESTHESIA:   general  PATIENT CONDITION:  ICU - intubated and hemodynamically stable.  PRE-OPERATIVE WEIGHT: 115kg  COMPLICATIONS: NO KNOWN   Ashwini Jago H 04/02/2012 12:25 PM

## 2012-04-02 NOTE — H&P (Signed)
CARDIOTHORACIC SURGERY HISTORY AND PHYSICAL EXAM  Referring Provider is Nahser, Deloris Ping, MD PCP is Nadean Corwin, MD, MD    Chief Complaint   Patient presents with   .  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 Other risk factors include long-standing tobacco abuse, hypertension, hyperlipidemia. Patient has had a recent acceleration of symptoms of chest discomfort that are somewhat atypical but characterized as tightness or pressure like pain in the chest which typically occurs after meals.  He was seen in the office by Dr. Elease Hashimoto and underwent stress Myoview exam that was abnormal revealing signs of previous inferior wall myocardial infarction as well as anterior and septal reversible ischemia. The patient underwent elective cardiac catheterization last week demonstrating severe three-vessel coronary artery disease with mild left ventricular dysfunction. He was referred for possible surgical revascularization.    Past Medical History   Diagnosis  Date   .  Hypertension     .  Hyperlipidemia     .  Bradycardia     .  Myocardial infarction         2004   .  Tobacco abuse  03/28/2012       Past Surgical History   Procedure  Date   .  Tonsillectomy     .  Knee surgery     .  Back surgery     .  Coronary angioplasty with stent placement         about 8 years ago at [facility]     No family history on file.  Social History History   Substance Use Topics   .  Smoking status:  Current Everyday Smoker -- 2.0 packs/day for 40 years   .  Smokeless tobacco:  Never Used   .  Alcohol Use:  No         Rarely       Current Outpatient Prescriptions   Medication  Sig  Dispense  Refill   .  aspirin 325 MG tablet  Take 325 mg by mouth daily.         Marland Kitchen  atenolol (TENORMIN) 50 MG tablet  Take 50 mg by  mouth daily.         Jennette Banker Sodium 30-100 MG CAPS  Take 100 mg by mouth daily. Stool Softner         .  Cyanocobalamin (VITAMIN B-12 PO)  Take by mouth.         .  Magnesium 500 MG CAPS  Take 500 mg by mouth daily.         .  naproxen sodium (ALEVE) 220 MG tablet  Take 220 mg by mouth 2 (two) times daily with a meal.          .  Omega-3 Fatty Acids (FISH OIL PO)  Take 2,000 mg by mouth daily.         .  pravastatin (PRAVACHOL) 40 MG tablet  Take 40 mg by mouth daily.         .  Vitamin D, Ergocalciferol, (DRISDOL) 50000 UNITS CAPS  Take 50,000 Units by mouth. Taking Four Times per Week           No Known Allergies  Review of Systems:  General:                      normal appetite, normal energy, + weight gain             Respiratory:                no cough, no wheezing, no hemoptysis, no pain with inspiration or cough, no shortness of breath             Cardiac:                      + chest pain or tightness, no exertional SOB, no resting SOB, no PND, no orthopnea, + LE edema, no palpitations, no syncope             GI:                                no difficulty swallowing, no hematochezia, no hematemesis, no melena, no constipation, no diarrhea, frequent gas             GU:                              no dysuria, no urgency, no frequency               Musculoskeletal:         no arthritis, no arthralgia               Vascular:                     no pain suggestive of claudication, some pain in feet while lying flat             Neuro:                         no symptoms suggestive of TIA's, no seizures, no headaches, no peripheral neuropathy               Endocrine:                   Negative               HEENT:                       no loose teeth or painful teeth,  no recent vision changes             Psych:                         no anxiety, no depression                Physical Exam:              BP 157/83  Pulse 56  Resp 16  Ht 6\' 3"  (1.905 m)   Wt 250 lb (113.399 kg)  BMI 31.25 kg/m2  SpO2 97%             General:                      Moderately obese,  well-appearing             HEENT:  Unremarkable               Neck:                           no JVD, no bruits, no adenopathy               Chest:                         clear to auscultation, symmetrical breath sounds, no wheezes, no rhonchi               CV:                              RRR, no  murmur               Abdomen:                    soft, non-tender, no masses               Extremities:                 warm, well-perfused, pulses not palpable, + bilateral LE edema             Rectal/GU                   Deferred             Neuro:                         Grossly non-focal and symmetrical throughout             Skin:                            Clean and dry, no rashes, no breakdown   Diagnostic Tests:  Cardiac Cath Note   MARTHA SOLTYS   [id number]   [birth year]   Procedure: left Heart Cardiac Catheterization Note   Indications: The patient has a history of stenting of the left circumflex artery. He presents with symptoms of unstable angina. He had a stress Myoview study which revealed the presence of an inferior basilar myocardial infarction with evidence of anterior septal ischemia.   Procedure Details   Consent: Obtained   Time Out: Verified patient identification, verified procedure, site/side was marked, verified correct patient position, special equipment/implants available, Radiology Safety Procedures followed, medications/allergies/relevent history reviewed, required imaging and test results available. Performed  Medications:  Fentanyl: 50 mcg intravenous   Versed: 2 mg intravenous   The right femoral artery was easily canulated using a modified Seldinger technique.   Hemodynamics:  LV pressure: 125/21  Aortic pressure: 126/78   Angiography  Left Main: The left main coronary artery is relatively smooth and normal.  Left anterior  Descending: The left anterior descending artery is moderate to large vessel. It gives off several large diagonal branches. There is a tight 95% stenosis in the mid LAD just after giving off a large second diagonal branch. Very small branch originates in the middle of this stenosis. This branch is too small to be considered a target for bypass grafting.. The distal left anterior descending artery has minor luminal irregularities.   The first diagonal branch has minor luminal regularities between 30 and 40%. The second  diagonal branch has a tight 80% irregular stenosis.   Left Circumflex: The left complex artery is large and dominant. The proximal aspect of the circumflex artery is normal. It gives off a large first obtuse marginal artery has a stent in the proximal segment. There is 80-90% in-stent restenosis located within the stent. The remaining first obtuse marginal artery has only minor luminal irregularities.   The second obtuse marginal artery is a small to moderate size vessel. It bifurcates very quickly. There is an 80% stenosis at the proximal segment and an 80-90% stenosis in the inferior segment. This branch may be too small to be a suitable bypass graft target.   The distal circumflex artery gives off a moderate-sized posterior descending artery. There is a tight 90% stenosis in the proximal aspect of the posterior descending artery.   Right Coronary Artery: The right coronary artery is small and is nondominant. It is severely and diffusely diseased.   LV Gram: Left ventricular gram was performed in the 30 RAO position. It reveals inferior basilar akinesis consistent with a previous inferior basilar myocardial infarction. The remaining left ventricle contracts fairly well. The ejection fraction is about a 55-60%.   Complications: No apparent complications   Patient did tolerate procedure well.   Conclusions:  1. Significant three-vessel coronary artery disease. The patient has significant  narrowings and I suspect will need coronary artery bypass grafting. We discussed these results with the patient and with the surgeons.   2. Overall well-preserved left infectious systolic function. He has evidence of a previous inferior basilar myocardial infarction.   Vesta Mixer, Montez Hageman., MD, [facility]   03/22/2012, 8:25 AM    Impression:  Severe three-vessel coronary artery disease with mild left ventricular dysfunction. The patient has recent acceleration of symptoms of chest discomfort primarily following meals that probably corresponds to his equivalent of angina pectoris. The patient appears to be reasonably good candidate for surgery although he does have long-standing tobacco abuse and moderate obesity.   Plan:  I have outlined the indications, risks, and potential benefits of coronary artery bypass grafting at length with the patient here in the office today. Alternative treatment strategies been discussed in detail. All of his questions been addressed. The need for him to quit smoking has been emphasized.  The patient understands and accepts all potential associated risks of surgery including but not limited to risk of death, stroke, myocardial infarction, congestive heart failure, respiratory failure, renal failure, bleeding requiring blood transfusion and/or reexploration, arrhythmia, heart block or bradycardia requiring permanent pacemaker, pneumonia, pleural effusion, wound infection, pulmonary embolus or other thromboembolic complication, chronic pain or other delayed complications.  All questions answered.  We plan to proceed with surgery on Tuesday, April 16.

## 2012-04-02 NOTE — Preoperative (Signed)
Beta Blockers   Reason not to administer Beta Blockers:Not Applicable 

## 2012-04-02 NOTE — Progress Notes (Deleted)
  Echocardiogram 2D Echocardiogram has been performed.  Dorena Cookey 04/02/2012, 11:21 AM

## 2012-04-02 NOTE — Progress Notes (Signed)
  Echocardiogram Echocardiogram Transesophageal has been performed.  Dorena Cookey 04/02/2012, 11:23 AM

## 2012-04-02 NOTE — OR Nursing (Signed)
Call placed to [employer] desk for family at removal of retractor at 11:47. Call placed to unit 2300 at removal of retractor at 11:47, skin sutures at 12:06 and patient exit.

## 2012-04-02 NOTE — Progress Notes (Signed)
Patient ID: Curtis Perez, male   DOB: 10/06/1951, 61 y.o.   MRN: [id number] S/p CABG x 3 Extubated Comfortable BP 137/80  Pulse 79  Temp(Src) 97.3 F (36.3 C) (Oral)  Resp 19  Wt 254 lb (115.214 kg)  SpO2 100% HCT 31  K 4.0 Continue present care

## 2012-04-03 ENCOUNTER — Encounter (HOSPITAL_COMMUNITY): Payer: Self-pay | Admitting: Thoracic Surgery (Cardiothoracic Vascular Surgery)

## 2012-04-03 ENCOUNTER — Inpatient Hospital Stay (HOSPITAL_COMMUNITY): Payer: BC Managed Care – PPO

## 2012-04-03 LAB — POCT I-STAT 3, ART BLOOD GAS (G3+)
Acid-base deficit: 1 mmol/L (ref 0.0–2.0)
Acid-base deficit: 2 mmol/L (ref 0.0–2.0)
Bicarbonate: 26.8 mEq/L — ABNORMAL HIGH (ref 20.0–24.0)
Bicarbonate: 24.8 mEq/L — ABNORMAL HIGH (ref 20.0–24.0)
O2 Saturation: 100 %
O2 Saturation: 100 %
TCO2: 28 mmol/L (ref 0–100)
TCO2: 26 mmol/L (ref 0–100)
pCO2 arterial: 54.9 mmHg — ABNORMAL HIGH (ref 35.0–45.0)
pCO2 arterial: 52.2 mmHg — ABNORMAL HIGH (ref 35.0–45.0)
pH, Arterial: 7.297 — ABNORMAL LOW (ref 7.350–7.450)
pH, Arterial: 7.285 — ABNORMAL LOW (ref 7.350–7.450)
pO2, Arterial: 552 mmHg — ABNORMAL HIGH (ref 80.0–100.0)
pO2, Arterial: 270 mmHg — ABNORMAL HIGH (ref 80.0–100.0)

## 2012-04-03 LAB — POCT I-STAT 4, (NA,K, GLUC, HGB,HCT)
Glucose, Bld: 104 mg/dL — ABNORMAL HIGH (ref 70–99)
Glucose, Bld: 104 mg/dL — ABNORMAL HIGH (ref 70–99)
Glucose, Bld: 95 mg/dL (ref 70–99)
Glucose, Bld: 95 mg/dL (ref 70–99)
Glucose, Bld: 136 mg/dL — ABNORMAL HIGH (ref 70–99)
HCT: 35 % — ABNORMAL LOW (ref 39.0–52.0)
HCT: 27 % — ABNORMAL LOW (ref 39.0–52.0)
HCT: 31 % — ABNORMAL LOW (ref 39.0–52.0)
HCT: 28 % — ABNORMAL LOW (ref 39.0–52.0)
HCT: 26 % — ABNORMAL LOW (ref 39.0–52.0)
Hemoglobin: 11.9 g/dL — ABNORMAL LOW (ref 13.0–17.0)
Hemoglobin: 10.5 g/dL — ABNORMAL LOW (ref 13.0–17.0)
Hemoglobin: 9.2 g/dL — ABNORMAL LOW (ref 13.0–17.0)
Hemoglobin: 9.5 g/dL — ABNORMAL LOW (ref 13.0–17.0)
Hemoglobin: 8.8 g/dL — ABNORMAL LOW (ref 13.0–17.0)
Potassium: 4.4 mEq/L (ref 3.5–5.1)
Potassium: 4.8 mEq/L (ref 3.5–5.1)
Potassium: 4.9 mEq/L (ref 3.5–5.1)
Potassium: 6.4 mEq/L (ref 3.5–5.1)
Potassium: 5.4 mEq/L — ABNORMAL HIGH (ref 3.5–5.1)
Sodium: 137 mEq/L (ref 135–145)
Sodium: 137 mEq/L (ref 135–145)
Sodium: 138 mEq/L (ref 135–145)
Sodium: 135 mEq/L (ref 135–145)
Sodium: 135 mEq/L (ref 135–145)

## 2012-04-03 LAB — MAGNESIUM: Magnesium: 2.2 mg/dL (ref 1.5–2.5)

## 2012-04-03 LAB — GLUCOSE, CAPILLARY
Glucose-Capillary: 107 mg/dL — ABNORMAL HIGH (ref 70–99)
Glucose-Capillary: 112 mg/dL — ABNORMAL HIGH (ref 70–99)
Glucose-Capillary: 115 mg/dL — ABNORMAL HIGH (ref 70–99)
Glucose-Capillary: 118 mg/dL — ABNORMAL HIGH (ref 70–99)
Glucose-Capillary: 118 mg/dL — ABNORMAL HIGH (ref 70–99)
Glucose-Capillary: 118 mg/dL — ABNORMAL HIGH (ref 70–99)
Glucose-Capillary: 120 mg/dL — ABNORMAL HIGH (ref 70–99)
Glucose-Capillary: 129 mg/dL — ABNORMAL HIGH (ref 70–99)
Glucose-Capillary: 151 mg/dL — ABNORMAL HIGH (ref 70–99)
Glucose-Capillary: 168 mg/dL — ABNORMAL HIGH (ref 70–99)

## 2012-04-03 LAB — CBC
HCT: 27.8 % — ABNORMAL LOW (ref 39.0–52.0)
Hemoglobin: 9.6 g/dL — ABNORMAL LOW (ref 13.0–17.0)
MCH: 27.9 pg (ref 26.0–34.0)
MCHC: 34.5 g/dL (ref 30.0–36.0)
MCV: 80.8 fL (ref 78.0–100.0)
Platelets: 140 10*3/uL — ABNORMAL LOW (ref 150–400)
RBC: 3.44 MIL/uL — ABNORMAL LOW (ref 4.22–5.81)
RDW: 14.6 % (ref 11.5–15.5)
WBC: 9.2 10*3/uL (ref 4.0–10.5)

## 2012-04-03 LAB — BASIC METABOLIC PANEL
BUN: 11 mg/dL (ref 6–23)
CO2: 25 mEq/L (ref 19–32)
Calcium: 8.2 mg/dL — ABNORMAL LOW (ref 8.4–10.5)
Chloride: 104 mEq/L (ref 96–112)
Creatinine, Ser: 0.89 mg/dL (ref 0.50–1.35)
GFR calc Af Amer: >90 mL/min (ref >90)
GFR calc non Af Amer: >90 mL/min (ref >90)
Glucose, Bld: 118 mg/dL — ABNORMAL HIGH (ref 70–99)
Potassium: 4.1 mEq/L (ref 3.5–5.1)
Sodium: 135 mEq/L (ref 135–145)

## 2012-04-03 LAB — POCT I-STAT GLUCOSE
Glucose, Bld: 106 mg/dL — ABNORMAL HIGH (ref 70–99)
Operator id: 333011

## 2012-04-03 MED ORDER — POTASSIUM CHLORIDE CRYS ER 20 MEQ PO TBCR
20.0000 meq | EXTENDED_RELEASE_TABLET | Freq: Every day | ORAL | Status: DC
  Administered 2012-04-04 – 2012-04-09 (×6): 20 meq via ORAL
  Filled 2012-04-03 (×6): qty 1

## 2012-04-03 MED ORDER — ATENOLOL 25 MG PO TABS
25.0000 mg | ORAL_TABLET | Freq: Two times a day (BID) | ORAL | Status: AC
  Administered 2012-04-03 – 2012-04-05 (×6): 25 mg via ORAL
  Filled 2012-04-03 (×7): qty 1

## 2012-04-03 MED ORDER — SODIUM CHLORIDE 0.9 % IJ SOLN: 3.0000 mL | INTRAMUSCULAR | Status: DC | PRN

## 2012-04-03 MED ORDER — SODIUM CHLORIDE 0.9 % IJ SOLN
3.0000 mL | Freq: Two times a day (BID) | INTRAMUSCULAR | Status: DC
  Administered 2012-04-03 – 2012-04-08 (×12): 3 mL via INTRAVENOUS

## 2012-04-03 MED ORDER — FUROSEMIDE 40 MG PO TABS
40.0000 mg | ORAL_TABLET | Freq: Every day | ORAL | Status: DC
  Administered 2012-04-04 – 2012-04-09 (×6): 40 mg via ORAL
  Filled 2012-04-03 (×6): qty 1

## 2012-04-03 MED ORDER — ASPIRIN EC 325 MG PO TBEC
325.0000 mg | DELAYED_RELEASE_TABLET | Freq: Every day | ORAL | Status: DC
  Administered 2012-04-03 – 2012-04-09 (×7): 325 mg via ORAL
  Filled 2012-04-03 (×7): qty 1

## 2012-04-03 MED ORDER — POTASSIUM CHLORIDE 10 MEQ/50ML IV SOLN
10.0000 meq | INTRAVENOUS | Status: AC
  Administered 2012-04-03 (×2): 10 meq via INTRAVENOUS

## 2012-04-03 MED ORDER — MOVING RIGHT ALONG BOOK
Freq: Once | Status: AC
  Administered 2012-04-03: 17:00:00
  Filled 2012-04-03: qty 1

## 2012-04-03 MED ORDER — INSULIN ASPART 100 UNIT/ML ~~LOC~~ SOLN
0.0000 [IU] | Freq: Three times a day (TID) | SUBCUTANEOUS | Status: DC
  Administered 2012-04-03 – 2012-04-05 (×5): 2 [IU] via SUBCUTANEOUS
  Administered 2012-04-07: 4 [IU] via SUBCUTANEOUS
  Administered 2012-04-08: 2 [IU] via SUBCUTANEOUS

## 2012-04-03 MED ORDER — FUROSEMIDE 10 MG/ML IJ SOLN
20.0000 mg | Freq: Four times a day (QID) | INTRAMUSCULAR | Status: AC
  Administered 2012-04-03 (×3): 20 mg via INTRAVENOUS
  Filled 2012-04-03 (×3): qty 2

## 2012-04-03 MED ORDER — TRAMADOL HCL 50 MG PO TABS
50.0000 mg | ORAL_TABLET | ORAL | Status: DC | PRN
  Administered 2012-04-03 – 2012-04-04 (×2): 100 mg via ORAL
  Filled 2012-04-03 (×3): qty 2

## 2012-04-03 MED ORDER — SODIUM CHLORIDE 0.9 % IV SOLN: 250.0000 mL | INTRAVENOUS | Status: DC | PRN

## 2012-04-03 MED ORDER — METOPROLOL TARTRATE 25 MG PO TABS: 25.0000 mg | ORAL_TABLET | Freq: Two times a day (BID) | ORAL | Status: DC

## 2012-04-03 MED ORDER — MIDAZOLAM HCL 2 MG/2ML IJ SOLN
2.0000 mg | Freq: Once | INTRAMUSCULAR | Status: AC
  Administered 2012-04-03: 2 mg via INTRAVENOUS
  Filled 2012-04-03: qty 2

## 2012-04-03 MED ORDER — ALPRAZOLAM 0.25 MG PO TABS: 0.2500 mg | ORAL_TABLET | Freq: Four times a day (QID) | ORAL | Status: DC | PRN

## 2012-04-03 NOTE — Progress Notes (Signed)
   CARDIOTHORACIC SURGERY PROGRESS NOTE   R1 Day Post-Op Procedure(s) (LRB): CORONARY ARTERY BYPASS GRAFTING (CABG) (N/A)  Subjective: Doing very well.  Mild soreness in chest.  Objective: Vital signs: BP Readings from Last 1 Encounters:  04/02/12 137/80   Pulse Readings from Last 1 Encounters:  04/03/12 80   Resp Readings from Last 1 Encounters:  04/03/12 15   Temp Readings from Last 1 Encounters:  04/03/12 99.1 F (37.3 C) Core (Comment)    Hemodynamics: PAP: (15-28)/(6-16) 25/9 mmHg CO:  [4.7 L/min-8.4 L/min] 8.4 L/min CI:  [1.9 L/min/m2-3.4 L/min/m2] 3.4 L/min/m2  Physical Exam:  Rhythm:   sinus  Breath sounds: clear  Heart sounds:  RRR  Incisions:  Dressings dry  Abdomen:  soft  Extremities:  warm   Intake/Output from previous day: 04/16 0701 - 04/17 0700 In: 5141 [P.O.:120; I.V.:3839; Blood:250; NG/GT:30; IV Piggyback:902] Out: 3700 [Urine:2760; Blood:500; Chest Tube:440] Intake/Output this shift:    Lab Results:  Basename 04/03/12 0420 04/02/12 1830 04/02/12 1828  WBC 9.2 -- 12.1*  HGB 9.6* 10.5* --  HCT 27.8* 31.0* --  PLT 140* -- 135*   BMET:  Basename 04/03/12 0420 04/02/12 1830  NA 135 134*  K 4.1 4.0  CL 104 109  CO2 25 --  GLUCOSE 118* 130*  BUN 11 12  CREATININE 0.89 0.90  CALCIUM 8.2* --    CBG (last 3)   Basename 04/03/12 0426 04/03/12 0024 04/02/12 2138  GLUCAP 115* 118* 118*   ABG    Component Value Date/Time   PHART 7.343* 04/02/2012 1826   HCO3 23.4 04/02/2012 1826   TCO2 22 04/02/2012 1830   ACIDBASEDEF 2.0 04/02/2012 1826   O2SAT 98.0 04/02/2012 1826   CXR: Looks good  Assessment/Plan: S/P Procedure(s) (LRB): CORONARY ARTERY BYPASS GRAFTING (CABG) (N/A)  Doing well POD1 Expected post op acute blood loss anemia, mild, stable Expected post op volume excess, mild   Mobilize  D/C tubes and lines  Diuresis  Transfer step down  Sulay Brymer H 04/03/2012 8:07 AM

## 2012-04-04 ENCOUNTER — Inpatient Hospital Stay (HOSPITAL_COMMUNITY): Payer: BC Managed Care – PPO

## 2012-04-04 LAB — GLUCOSE, CAPILLARY
Glucose-Capillary: 149 mg/dL — ABNORMAL HIGH (ref 70–99)
Glucose-Capillary: 145 mg/dL — ABNORMAL HIGH (ref 70–99)
Glucose-Capillary: 106 mg/dL — ABNORMAL HIGH (ref 70–99)
Glucose-Capillary: 102 mg/dL — ABNORMAL HIGH (ref 70–99)

## 2012-04-04 LAB — BASIC METABOLIC PANEL
BUN: 11 mg/dL (ref 6–23)
CO2: 28 mEq/L (ref 19–32)
Calcium: 8.7 mg/dL (ref 8.4–10.5)
Chloride: 97 mEq/L (ref 96–112)
Creatinine, Ser: 0.91 mg/dL (ref 0.50–1.35)
GFR calc Af Amer: >90 mL/min (ref >90)
GFR calc non Af Amer: >90 mL/min (ref >90)
Glucose, Bld: 130 mg/dL — ABNORMAL HIGH (ref 70–99)
Potassium: 3.8 mEq/L (ref 3.5–5.1)
Sodium: 133 mEq/L — ABNORMAL LOW (ref 135–145)

## 2012-04-04 LAB — CBC
HCT: 28.4 % — ABNORMAL LOW (ref 39.0–52.0)
Hemoglobin: 9.6 g/dL — ABNORMAL LOW (ref 13.0–17.0)
MCH: 27.9 pg (ref 26.0–34.0)
MCHC: 33.8 g/dL (ref 30.0–36.0)
MCV: 82.6 fL (ref 78.0–100.0)
Platelets: 129 10*3/uL — ABNORMAL LOW (ref 150–400)
RBC: 3.44 MIL/uL — ABNORMAL LOW (ref 4.22–5.81)
RDW: 14.6 % (ref 11.5–15.5)
WBC: 9 10*3/uL (ref 4.0–10.5)

## 2012-04-04 MED ORDER — POTASSIUM CHLORIDE CRYS ER 20 MEQ PO TBCR
40.0000 meq | EXTENDED_RELEASE_TABLET | Freq: Once | ORAL | Status: AC
  Administered 2012-04-04: 40 meq via ORAL
  Filled 2012-04-04: qty 2

## 2012-04-04 MED FILL — Heparin Sodium (Porcine) Inj 1000 Unit/ML: INTRAMUSCULAR | Qty: 42 | Status: AC

## 2012-04-04 MED FILL — Potassium Chloride Inj 2 mEq/ML: INTRAVENOUS | Qty: 40 | Status: AC

## 2012-04-04 MED FILL — Magnesium Sulfate Inj 50%: INTRAMUSCULAR | Qty: 10 | Status: AC

## 2012-04-04 NOTE — Progress Notes (Addendum)
Pt ambulated 

## 2012-04-04 NOTE — Progress Notes (Signed)
   CARDIOTHORACIC SURGERY PROGRESS NOTE   R2 Days Post-Op Procedure(s) (LRB): CORONARY ARTERY BYPASS GRAFTING (CABG) (N/A)  Subjective: Doing fine.  Mild soreness. No complaints.  Objective: Vital signs: BP Readings from Last 1 Encounters:  04/04/12 134/60   Pulse Readings from Last 1 Encounters:  04/04/12 68   Resp Readings from Last 1 Encounters:  04/04/12 23   Temp Readings from Last 1 Encounters:  04/04/12 98.5 F (36.9 C) Oral    Hemodynamics: PAP: (28)/(7) 28/7 mmHg  Physical Exam:  Rhythm:   sinus  Breath sounds: clear  Heart sounds:  RRR  Incisions:  Clean and dry  Abdomen:  soft  Extremities:  warm   Intake/Output from previous day: 04/17 0701 - 04/18 0700 In: 1210 [P.O.:1080; I.V.:80; IV Piggyback:50] Out: 2665 [Urine:2535; Chest Tube:130] Intake/Output this shift:    Lab Results:  [facility] 04/04/12 0438 04/03/12 0420  WBC 9.0 9.2  HGB 9.6* 9.6*  HCT 28.4* 27.8*  PLT 129* 140*   BMET:  Basename 04/04/12 0438 04/03/12 0420  NA 133* 135  K 3.8 4.1  CL 97 104  CO2 28 25  GLUCOSE 130* 118*  BUN 11 11  CREATININE 0.91 0.89  CALCIUM 8.7 8.2*    CBG (last 3)   Basename 04/03/12 2157 04/03/12 2005 04/03/12 1615  GLUCAP 120* 168* 129*   ABG    Component Value Date/Time   PHART 7.343* 04/02/2012 1826   HCO3 23.4 04/02/2012 1826   TCO2 22 04/02/2012 1830   ACIDBASEDEF 2.0 04/02/2012 1826   O2SAT 98.0 04/02/2012 1826   CXR: Looks good  Assessment/Plan: S/P Procedure(s) (LRB): CORONARY ARTERY BYPASS GRAFTING (CABG) (N/A)  Doing well POD2 Expected post op acute blood loss anemia, mild, stable Expected post op volume excess, mild, diuresing Still waiting for bed on 2000 for transfer   Mobilize  Diuresis  Transfer 2000 when bed available  D/C planning   Tanganika Barradas H 04/04/2012 8:26 AM

## 2012-04-04 NOTE — Progress Notes (Signed)
Ambulated pt to new room 2025, pt tolerated, NAD, VSS, denied SOB or pain, pt placed on 2000 telemetry, confirmed that pt is on monitor with secretary, NT at bedside to assist, receiving RN notified of pt's arrival

## 2012-04-05 LAB — GLUCOSE, CAPILLARY
Glucose-Capillary: 110 mg/dL — ABNORMAL HIGH (ref 70–99)
Glucose-Capillary: 95 mg/dL (ref 70–99)
Glucose-Capillary: 188 mg/dL — ABNORMAL HIGH (ref 70–99)
Glucose-Capillary: 129 mg/dL — ABNORMAL HIGH (ref 70–99)

## 2012-04-05 MED ORDER — ATENOLOL 50 MG PO TABS
50.0000 mg | ORAL_TABLET | Freq: Every day | ORAL | Status: DC
  Administered 2012-04-06 – 2012-04-09 (×4): 50 mg via ORAL
  Filled 2012-04-05 (×4): qty 1

## 2012-04-05 MED ORDER — POTASSIUM CHLORIDE CRYS ER 20 MEQ PO TBCR: 20.0000 meq | EXTENDED_RELEASE_TABLET | Freq: Every day | ORAL | Status: DC

## 2012-04-05 MED ORDER — LISINOPRIL 10 MG PO TABS
10.0000 mg | ORAL_TABLET | Freq: Every day | ORAL | Status: DC
  Administered 2012-04-06 – 2012-04-09 (×4): 10 mg via ORAL
  Filled 2012-04-05 (×4): qty 1

## 2012-04-05 MED ORDER — LISINOPRIL 10 MG PO TABS: 10.0000 mg | ORAL_TABLET | Freq: Every day | ORAL | Status: DC

## 2012-04-05 MED ORDER — FUROSEMIDE 40 MG PO TABS: 40.0000 mg | ORAL_TABLET | Freq: Every day | ORAL | Status: DC

## 2012-04-05 MED ORDER — OXYCODONE HCL 5 MG PO TABS: 5.0000 mg | ORAL_TABLET | ORAL | Status: AC | PRN

## 2012-04-05 MED FILL — Sodium Bicarbonate IV Soln 8.4%: INTRAVENOUS | Qty: 50 | Status: AC

## 2012-04-05 MED FILL — Mannitol IV Soln 20%: INTRAVENOUS | Qty: 500 | Status: AC

## 2012-04-05 MED FILL — Sodium Chloride Irrigation Soln 0.9%: Qty: 3000 | Status: AC

## 2012-04-05 MED FILL — Electrolyte-R (PH 7.4) Solution: INTRAVENOUS | Qty: 6000 | Status: AC

## 2012-04-05 MED FILL — Heparin Sodium (Porcine) Inj 1000 Unit/ML: INTRAMUSCULAR | Qty: 30 | Status: AC

## 2012-04-05 MED FILL — Heparin Sodium (Porcine) Inj 1000 Unit/ML: INTRAMUSCULAR | Qty: 10 | Status: AC

## 2012-04-05 MED FILL — Sodium Chloride IV Soln 0.9%: INTRAVENOUS | Qty: 1000 | Status: AC

## 2012-04-05 NOTE — Progress Notes (Addendum)
   CARDIOTHORACIC SURGERY PROGRESS NOTE  3 Days Post-Op  S/P Procedure(s) (LRB): CORONARY ARTERY BYPASS GRAFTING (CABG) (N/A)  Subjective: Feels well.  Progressing rapidly.  Objective: Vital signs in last 24 hours: Temp:  [98.1 F (36.7 C)-98.7 F (37.1 C)] 98.5 F (36.9 C) (04/19 0602) Pulse Rate:  [57-61] 59  (04/19 0602) Cardiac Rhythm:  [-] Normal sinus rhythm (04/18 2015) Resp:  [15-18] 18  (04/19 0602) BP: (131-161)/(62-81) 147/71 mmHg (04/19 0602) SpO2:  [91 %-96 %] 91 % (04/19 0602) Weight:  [114.941 kg (253 lb 6.4 oz)] 114.941 kg (253 lb 6.4 oz) (04/19 0602)  Physical Exam:  Rhythm:   sinus  Breath sounds: clear  Heart sounds:  RRR  Incisions:  Clean and dry  Abdomen:  soft  Extremities:  Warm, mild edema   Intake/Output from previous day: 04/18 0701 - 04/19 0700 In: 1570 [P.O.:1560; I.V.:10] Out: -  Intake/Output this shift: Total I/O In: 600 [P.O.:600] Out: -   Lab Results:  [facility] 04/04/12 0438 04/03/12 0420  WBC 9.0 9.2  HGB 9.6* 9.6*  HCT 28.4* 27.8*  PLT 129* 140*   BMET:  Basename 04/04/12 0438 04/03/12 0420  NA 133* 135  K 3.8 4.1  CL 97 104  CO2 28 25  GLUCOSE 130* 118*  BUN 11 11  CREATININE 0.91 0.89  CALCIUM 8.7 8.2*    CBG (last 3)   Basename 04/05/12 1140 04/05/12 0626 04/04/12 2103  GLUCAP 95 110* 102*   PT/INR:  No results found for this basename: LABPROT,INR in the last 72 hours  CXR:  N/A  Assessment/Plan: S/P Procedure(s) (LRB): CORONARY ARTERY BYPASS GRAFTING (CABG) (N/A)  Doing well POD3 Will add ACE-I for hypertension and resume home dose Atenolol Ready for d/c soon Will need short term placement in SNF  Curtis Perez H 04/05/2012 2:30 PM

## 2012-04-05 NOTE — Progress Notes (Signed)
CARDIAC REHAB PHASE I   PRE:  Rate/Rhythm: 57 SB  BP:  Supine:   Sitting: 114/60  Standing:    SaO2: 94 RA  MODE:  Ambulation: 890 ft   POST:  Rate/Rhythem: 78  BP:  Supine:   Sitting: 18/70  Standing:    SaO2: 95 RA 1055-1155 Tolerated ambulation well without any assistive devices. Gait steady,strong. VS stable Able to walk 890 feet without c/o. Back to recliner after walk with call light in reach. Completed discharge education with pt.He agrees to [employer]. [facility] in [place], will send referral.  Beatrix Fetters

## 2012-04-05 NOTE — Discharge Summary (Signed)
Physician Discharge Summary  Patient ID: RAHKIM RABALAIS MRN: [id number] DOB/AGE: [birth year] 61 y.o.  Admit date: 04/02/2012 Discharge date: 04/08/2012  Admission Diagnoses: 1.History of CAD (s/p MI in 04, PCI with stent) 2. History of hypertension 3. History of hyperlipidemia 4. History of bradycardia 5. History of tobacco abuse  Discharge Diagnoses:  1.History of CAD (s/p MI in 04, PCI with stent) 2. History of hypertension 3. History of hyperlipidemia 4. History of bradycardia 5. History of tobacco abuse 6.Mild thrombocytopenia 7.ABL anemia   Procedure (s):  CORONARY ARTERY BYPASS GRAFTING (CABG)X 3 (LIMA-LAD; SVG-OM; SVG-PD) by Dr. Cornelius Moras on 04/02/2012.  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 His other risk factors include long-standing tobacco abuse, hypertension, and hyperlipidemia. He has had a recent acceleration of symptoms of chest discomfort that are somewhat atypical, but characterized as tightness or pressure like pain in the chest ,which typically occurs after meals. He was seen in the office by Dr. Elease Hashimoto and underwent stress Myoview exam. Results were abnormal and revealed signs of a previous inferior wall myocardial infarction, as well as anterior and septal reversible ischemia. The patient underwent elective cardiac catheterization on 03/22/2012 which demonstrated severe three-vessel coronary artery disease with mild left ventricular dysfunction. He was referred to Dr. Cornelius Moras for possible surgical revascularization. After reviewing the films and speaking with the patient, it was decided that the patient needed to undergo coronary bypass grafting surgery. Potential risks, complications, and benefits of the surgerywere discussed with the patient and he agreed to proceed. He was in the to [facility] on 04/02/2012 in order to undergo CABG  x3.   Brief Hospital Course:  Patient was extubated successfully early afternoon of surgery. He remained afebrile and hemodynamic is stable. His Swan-Ganz, A-line, chest tubes, and Foley were all removed early in his postoperative course. He was started on a low-dose beta blocker which was titrated accordingly. He was found to be volume overloaded and diuresed. He did have acute blood loss anemia. His H&H went as low as 8.8 and 26. He did not require postoperative transfusion. His last H&H was up to 9.6 and 20.4. He also had mild postoperative thrombocytopenia. His last platelet count was 129,000. Patient sought low-pressure then became elevated. He was felt surgically stable for transfer from the intensive care unit to 2000 further convalescence on 04/04/2012. He was then found to have hypertension. He was resumed back on his home dose of atenolol and lisinopril was also added. His blood pressure improved and he remained in sinus rhythm.He has already been tolerating a diet and has had a bowel movement. Epicardial pacing wires and chest tube sutures will be removed prior his discharge. Provided he remains afebrile, hemodynamically stable, and pending morning round evaluation, he'll be surgically stable for transfer to an SNF on Monday, 04/08/2012.   Latest Vital Signs: Blood pressure 113/60, pulse 57, temperature 98.4 F (36.9 C), temperature source Oral, resp. rate 20, height 6' 2.5" (1.892 m), weight 253 lb 6.4 oz (114.941 kg), SpO2 90.00%.  Physical Exam: Rhythm: sinus  Breath sounds: clear  Heart sounds: RRR  Incisions: Clean and dry  Abdomen: soft  Extremities: Warm, mild edema   Discharge Condition:Stable  Recent laboratory studies:  Lab Results  Component Value Date   WBC 9.0 04/04/2012   HGB 9.6* 04/04/2012   HCT 28.4* 04/04/2012   MCV 82.6 04/04/2012   PLT 129* 04/04/2012  Lab Results  Component Value Date   NA 133* 04/04/2012   K 3.8 04/04/2012   CL 97 04/04/2012   CO2 28  04/04/2012   CREATININE 0.91 04/04/2012   GLUCOSE 130* 04/04/2012    Diagnostic Studies: Dg Chest 2 View  04/04/2012  *RADIOLOGY REPORT*  Clinical Data: Shortness of breath status post recent cardiac surgery.  CHEST - 2 VIEW  Comparison: 04/03/2012.  Findings: Interval removal of the Swan-Ganz catheter and chest tubes.  There is no pneumothorax.  Mild residual perihilar and basilar atelectasis remains on the left.  There are small bilateral pleural effusions.  The heart size and mediastinal contours are stable status post CABG.  There is moderate distension of the colon.  IMPRESSION: No pneumothorax following chest removal.  Mild left-sided atelectasis, small pleural effusions and colonic ileus.  Original Report Authenticated By: Gerrianne Scale, M.D.   Discharge Orders    Future Appointments: Provider: Department: Dept Phone: Center:   04/17/2012 3:00 PM Vesta Mixer, MD [facility] [phone] None   04/29/2012 11:30 AM Purcell Nails, MD [facility] [phone] [facility]   05/24/2012 11:45 AM Vesta Mixer, MD [facility] [phone] None     Future Orders Please Complete By Expires   Amb Referral to Cardiac Rehabilitation         Discharge Medications: Medication List  As of 04/05/2012  3:31 PM   STOP taking these medications         ALEVE 220 MG tablet         TAKE these medications         aspirin 325 MG tablet   Take 325 mg by mouth daily.      atenolol 50 MG tablet   Commonly known as: TENORMIN   Take 50 mg by mouth daily.      Casanthranol-Docusate Sodium 30-100 MG Caps   Take 100 mg by mouth daily. Stool Softner      FISH OIL PO   Take 2,000 mg by mouth daily.      furosemide 40 MG tablet   Commonly known as: LASIX   Take 1 tablet (40 mg total) by mouth daily. For 4 days then stop.      lisinopril 10 MG tablet   Commonly known as: PRINIVIL,ZESTRIL   Take 1 tablet (10 mg total) by mouth daily.      Magnesium 500 MG Caps   Take 500 mg by mouth  daily.      oxyCODONE 5 MG immediate release tablet   Commonly known as: Oxy IR/ROXICODONE   Take 1-2 tablets (5-10 mg total) by mouth every 4 (four) hours as needed for pain.      potassium chloride SA 20 MEQ tablet   Commonly known as: K-DUR,KLOR-CON   Take 1 tablet (20 mEq total) by mouth daily. For 4 days then stop.      pravastatin 40 MG tablet   Commonly known as: PRAVACHOL   Take 40 mg by mouth daily.      VITAMIN B-12 PO   Take 1,000 mcg by mouth daily.      Vitamin D (Ergocalciferol) 50000 UNITS Caps   Commonly known as: DRISDOL   Take 50,000 Units by mouth See admin instructions. Taking Four Times per Week. mon wed fri sat            Follow Up Appointments: Follow-up Information    Follow up with Purcell Nails, MD. (PA/LAT CXR to be taken at [facility] (in  same building as office) on 04/29/12 at 10:30 am;Appointment with Dr. Cornelius Moras is on 04/29/12 at  11:30 am)    Contact information:   [address] [place] [address] [phone]       Follow up with Elyn Aquas., MD. (Call for a follow up for 2 weeks)    Contact information:   [address] [place] [address] [phone]          Signed: Doree Fudge MPA-C 04/05/2012, 3:31 PM

## 2012-04-05 NOTE — Consult Note (Signed)
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 Curtis Perez is currently in the pre-contemplation stage. While mentioning risk factors he says he's aware of the risk factors and voices understanding. He does not want to quit and refuses written education as well as f/u contact information.

## 2012-04-05 NOTE — Discharge Summary (Signed)
I agree with the above discharge summary and plan for follow-up.  Wilbern Pennypacker H  

## 2012-04-05 NOTE — Discharge Instructions (Signed)
Activity: 1.May walk up steps                2.No lifting more than ten pounds for four weeks.                 3.No driving for four weeks.                4.Stop any activity that causes chest pain, shortness of breath, dizziness, sweating or excessive weakness.                5.Avoid straining.                6.Continue with your breathing exercises daily.  Diet: Diabetic diet and Low fat, Low salt diet  Wound Care: May shower.  Clean wounds with mild soap and water daily. Contact the office at [phone] if any problems arise.  Coronary Artery Bypass Grafting Care After Refer to this sheet in the next few weeks. These instructions provide you with information on caring for yourself after your procedure. Your caregiver may also give you more specific instructions. Your treatment has been planned according to current medical practices, but problems sometimes occur. Call your caregiver if you have any problems or questions after your procedure.  Recovery from open heart surgery will be different for everyone. Some people feel well after 3 or 4 weeks, while for others it takes longer. After heart surgery, it may be normal to:  Not have an appetite, feel nauseated by the smell of food, or only want to eat a small amount.   Be constipated because of changes in your diet, activity, and medicines. Eat foods high in fiber. Add fresh fruits and vegetables to your diet. Stool softeners may be helpful.   Feel sad or unhappy. You may be frustrated or cranky. You may have good days and bad days. Do not give up. Talk to your caregiver if you do not feel better.   Feel weakness and fatigue. You many need physical therapy or cardiac rehabilitation to get your strength back.   Develop an irregular heartbeat called atrial fibrillation. Symptoms of atrial fibrillation are a fast, irregular heartbeat or feelings of fluttery heartbeats, shortness of breath, low blood pressure, and dizziness. If these symptoms  develop, see your caregiver right away.  MEDICATION  Have a list of all the medicines you will be taking when you leave the hospital. For every medicine, know the following:   Name.   Exact dose.   Time of day to be taken.   How often it should be taken.   Why you are taking it.   Ask which medicines should or should not be taken together. If you take more than one heart medicine, ask if it is okay to take them together. Some heart medicines should not be taken at the same time because they may lower your blood pressure too much.   Narcotic pain medicine can cause constipation. Eat fresh fruits and vegetables. Add fiber to your diet. Stool softener medicine may help relieve constipation.   Keep a copy of your medicines with you at all times.   Do not add or stop taking any medicine until you check with your caregiver.   Medicines can have side effects. Call your caregiver who prescribed the medicine if you:   Start throwing up, have diarrhea, or have stomach pain.   Feel dizzy or lightheaded when you stand up.   Feel your heart is skipping beats or is   beating too fast or too slow.   Develop a rash.   Notice unusual bruising or bleeding.  HOME CARE INSTRUCTIONS  After heart surgery, it is important to learn how to take your pulse. Have your caregiver show you how to take your pulse.   Use your incentive spirometer. Ask your caregiver how long after surgery you need to use it.  Care of your chest incision  Tell your caregiver right away if you notice clicking in your chest (sternum).   Support your chest with a pillow or your arms when you take deep breaths and cough.   Follow your caregiver's instructions about when you can bathe or swim.   Protect your incision from sunlight during the first year to keep the scar from getting dark.   Tell your caregiver if you notice:   Increased tenderness of your incision.   Increased redness or swelling around your incision.     Drainage or pus from your incision.  Care of your leg incision(s)  Avoid crossing your legs.   Avoid sitting for long periods of time. Change positions every half hour.   Elevate your leg(s) when you are sitting.   Check your leg(s) daily for swelling. Check the incisions for redness or drainage.   Wear your elastic stockings as told by your caregiver. Take them off at bedtime.  Diet  Diet is very important to heart health.   Eat plenty of fresh fruits and vegetables. Meats should be lean cut. Avoid canned, processed, and fried foods.   Talk to a dietician. They can teach you how to make healthy food and drink choices.  Weight  Weigh yourself every day. This is important because it helps to know if you are retaining fluid that may make your heart and lungs work harder.   Use the same scale each time.   Weigh yourself every morning at the same time. You should do this after you go to the bathroom, but before you eat breakfast.   Your weight will be more accurate if you do not wear any clothes.   Record your weight.   Tell your caregiver if you have gained 2 pounds or more overnight.  Activity Stop any activity at once if you have chest pain, shortness of breath, irregular heartbeats, or dizziness. Get help right away if you have any of these symptoms.  Bathing.  Avoid soaking in a bath or hot tub until your incisions are healed.   Rest. You need a balance of rest and activity.   Exercise. Exercise per your caregiver's advice. You may need physical therapy or cardiac rehabilitation to help strengthen your muscles and build your endurance.   Climbing stairs. Unless your caregiver tells you not to climb stairs, go up stairs slowly and rest if you tire. Do not pull yourself up by the handrail.   Driving a car. Follow your caregiver's advice on when you may drive. You may ride as a passenger at any time. When traveling for long periods of time in a car, get out of the car and  walk around for a few minutes every 2 hours.   Lifting. Avoid lifting, pushing, or pulling anything heavier than 10 pounds for 6 weeks after surgery or as told by your caregiver.   Returning to work. Check with your caregiver. People heal at different rates. Most people will be able to go back to work 6 to 12 weeks after surgery.   Sexual activity. You may resume sexual relations   as told by your caregiver.  SEEK MEDICAL CARE IF:  Any of your incisions are red, painful, or have any type of drainage coming from them.   You have an oral temperature above 102 F (38.9 C).   You have ankle or leg swelling.   You have pain in your legs.   You have weight gain of 2 or more pounds a day.   You feel dizzy or lightheaded when you stand up.  SEEK IMMEDIATE MEDICAL CARE IF:  You have angina or chest pain that goes to your jaw or arms. Call your local emergency services right away.   You have shortness of breath at rest or with activity.   You have a fast or irregular heartbeat (arrhythmia).   There is a "clicking" in your sternum when you move.   You have numbness or weakness in your arms or legs.  MAKE SURE YOU:  Understand these instructions.   Will watch your condition.   Will get help right away if you are not doing well or get worse.  Document Released: 06/23/2005 Document Revised: 11/23/2011 Document Reviewed: 02/08/2011 [employer] Patient Information 2012 [employer].    

## 2012-04-06 LAB — GLUCOSE, CAPILLARY
Glucose-Capillary: 97 mg/dL (ref 70–99)
Glucose-Capillary: 124 mg/dL — ABNORMAL HIGH (ref 70–99)
Glucose-Capillary: 112 mg/dL — ABNORMAL HIGH (ref 70–99)

## 2012-04-06 NOTE — Progress Notes (Signed)
CARDIAC REHAB PHASE I   PRE:  Rate/Rhythm: 65 SR  BP:  Supine:   Sitting: 138/60  Standing:    SaO2: 95 RA  MODE:  Ambulation: 550 ft   POST:  Rate/Rhythem: 83 SR  BP:  Supine:   Sitting: 136/60  Standing:  SaO2: 99 RA   1044 -1110  Pt ambulated unit on RA tolerated well, no c/o cp or sob. Back to chair with feet up call bell in reach. Enc use of IS.    Rosalie Doctor

## 2012-04-06 NOTE — Progress Notes (Addendum)
4 Days Post-Op Procedure(s) (LRB): CORONARY ARTERY BYPASS GRAFTING (CABG) (N/A)  Subjective: Patient without complaints.   Objective: Vital signs in last 24 hours: Patient Vitals for the past 24 hrs:  BP Temp Temp src Pulse Resp SpO2 Weight  04/06/12 0500 118/58 mmHg 98.5 F (36.9 C) Oral 63  20  90 % 249 lb 12.5 oz (113.3 kg)  04/05/12 2215 117/69 mmHg 98.4 F (36.9 C) Oral 56  20  96 % -  04/05/12 1400 113/60 mmHg 98.4 F (36.9 C) Oral 57  20  90 % -   Pre op weight  115 kg Current Weight  04/06/12 249 lb 12.5 oz (113.3 kg)      Intake/Output from previous day: 04/19 0701 - 04/20 0700 In: 600 [P.O.:600] Out: -    Physical Exam:  Cardiovascular: RRR, no murmurs, gallops, or rubs. Pulmonary: Clear to auscultation bilaterally; no rales, wheezes, or rhonchi. Abdomen: Soft, non tender, bowel sounds present. Extremities: Mild bilateral lower extremity edema. Wounds: Clean and dry.  No erythema or signs of infection.  Lab Results: CBC: Basename 04/04/12 0438  WBC 9.0  HGB 9.6*  HCT 28.4*  PLT 129*   BMET:  Basename 04/04/12 0438  NA 133*  K 3.8  CL 97  CO2 28  GLUCOSE 130*  BUN 11  CREATININE 0.91  CALCIUM 8.7    PT/INR: No results found for this basename: LABPROT,INR in the last 72 hours ABG:  INR: Will add last result for INR, ABG once components are confirmed Will add last 4 CBG results once components are confirmed  Assessment/Plan:  1. CV - SR.Continue Atenolol 50 daily and Lisinopril 10 daily. 2.  Pulmonary - Encourage incentive spirometer. 3. Volume Overload - Continue with diuresis. 4.  Acute blood loss anemia - H/H 9.6/28.4/ 5.Mild thrombocytopenia-Platelets 129,000. 6.Remove EPW am. 7.To SNF on Monday.   ZIMMERMAN,DONIELLE MPA-C 04/06/2012   I have seen and examined the patient and agree with the assessment and plan as outlined.  Jensen Kilburg H 04/06/2012 12:56 PM

## 2012-04-07 LAB — GLUCOSE, CAPILLARY
Glucose-Capillary: 173 mg/dL — ABNORMAL HIGH (ref 70–99)
Glucose-Capillary: 140 mg/dL — ABNORMAL HIGH (ref 70–99)
Glucose-Capillary: 106 mg/dL — ABNORMAL HIGH (ref 70–99)
Glucose-Capillary: 95 mg/dL (ref 70–99)

## 2012-04-07 NOTE — Progress Notes (Signed)
Pt ambulated > 500 ft. Tolerated well. Curtis Mt.N.

## 2012-04-07 NOTE — Progress Notes (Addendum)
5 Days Post-Op Procedure(s) (LRB): CORONARY ARTERY BYPASS GRAFTING (CABG) (N/A)  Subjective: Patient without complaints.   Objective: Vital signs in last 24 hours: Patient Vitals for the past 24 hrs:  BP Temp Temp src Pulse Resp SpO2 Weight  04/07/12 0511 115/70 mmHg 97.7 F (36.5 C) Oral 59  18  93 % 244 lb 14.9 oz (111.1 kg)  04/06/12 2003 136/82 mmHg 97.9 F (36.6 C) Oral 62  18  94 % -  04/06/12 1458 112/65 mmHg 98.2 F (36.8 C) Oral 57  18  91 % -   Pre op weight  115 kg Current Weight  04/07/12 244 lb 14.9 oz (111.1 kg)      Intake/Output from previous day: 04/20 0701 - 04/21 0700 In: 720 [P.O.:720] Out: -    Physical Exam:  Cardiovascular: RRR, no murmurs, gallops, or rubs. Pulmonary: Clear to auscultation bilaterally; no rales, wheezes, or rhonchi. Abdomen: Soft, non tender, bowel sounds present. Extremities: Trace bilateral lower extremity edema. Wounds: Clean and dry.  No erythema or signs of infection.  Lab Results: CBC:No results found for this basename: WBC:2,HGB:2,HCT:2,PLT:2 in the last 72 hours BMET: No results found for this basename: NA:2,K:2,CL:2,CO2:2,GLUCOSE:2,BUN:2,CREATININE:2,CALCIUM:2 in the last 72 hours  PT/INR: No results found for this basename: LABPROT,INR in the last 72 hours ABG:  INR: Will add last result for INR, ABG once components are confirmed Will add last 4 CBG results once components are confirmed  Assessment/Plan:  1. CV - SR.Continue Atenolol 50 daily and Lisinopril 10 daily. 2.  Pulmonary - Encourage incentive spirometer. 3. Volume Overload - Continue with diuresis. 4.  Acute blood loss anemia - H/H 9.6/28.4 5.Mild thrombocytopenia-Platelets 129,000. 6.Remove EPW. 7.To SNF in am.    Ardelle Balls PA-C 04/07/2012 8:48 AM   I have seen and examined the patient and agree with the assessment and plan as outlined.  Ermal Brzozowski H 04/07/2012 11:50 AM

## 2012-04-08 LAB — GLUCOSE, CAPILLARY
Glucose-Capillary: 120 mg/dL — ABNORMAL HIGH (ref 70–99)
Glucose-Capillary: 131 mg/dL — ABNORMAL HIGH (ref 70–99)
Glucose-Capillary: 103 mg/dL — ABNORMAL HIGH (ref 70–99)
Glucose-Capillary: 93 mg/dL (ref 70–99)
Glucose-Capillary: 110 mg/dL — ABNORMAL HIGH (ref 70–99)

## 2012-04-08 MED ORDER — LISINOPRIL 10 MG PO TABS: 10.0000 mg | ORAL_TABLET | Freq: Every day | ORAL | Status: DC

## 2012-04-08 MED ORDER — FUROSEMIDE 40 MG PO TABS: 40.0000 mg | ORAL_TABLET | Freq: Every day | ORAL | Status: DC

## 2012-04-08 MED ORDER — POTASSIUM CHLORIDE CRYS ER 20 MEQ PO TBCR: 20.0000 meq | EXTENDED_RELEASE_TABLET | Freq: Every day | ORAL | Status: DC

## 2012-04-08 NOTE — Progress Notes (Signed)
   CARE MANAGEMENT NOTE 04/08/2012  Patient:  Curtis Perez, Curtis Perez   Account Number:  [id number]  Date Initiated:  04/04/2012  Documentation initiated by:  Pierrette Scheu  Subjective/Objective Assessment:   PT S/P CABG X 3 ON 04/02/12.  PTA, PT LIVES ALONE AND IS INDEPENDENT.     Action/Plan:   MET WITH PT TO DISCUSS DC PLANS.  PT STATES HE WILL DC HOME WITH FRIEND AT DISCHARGE.   Anticipated DC Date:  04/09/2012   Anticipated DC Plan:  SKILLED NURSING FACILITY  In-house referral  Clinical Social Worker      DC Planning Services  CM consult      Choice offered to / List presented to:             Status of service:  In process, will continue to follow Medicare Important Message given?   (If response is "NO", the following Medicare IM given date fields will be blank) Date Medicare IM given:   Date Additional Medicare IM given:    Discharge Disposition:    Per UR Regulation:    If discussed at Long Length of Stay Meetings, dates discussed:    Comments:  04/08/12 Annica Marinello,RN,BSN 1030 ALERTED BY PT'S NURSE THAT PT STATES HIS PLAN TO DC HOME WITH FRIEND HAS FALLEN THROUGH; WILL NEED SNF PLACEMENT NOW.  WILL OBTAIN PHYSICAL THERAPY CONSULT AND CONSULT CSW TO FACILITATE DC TO SNF ASAP, AS PT IS READY FOR DC. Phone #[phone]

## 2012-04-08 NOTE — Progress Notes (Signed)
CSW contacted by RN, who states pt is ready for d/c and is planning for SNF. CSW was not consulted and did not receive a referral for this pt, who had told RNCM he was to d/c home with a friend.   Pt has not yet seen this pt, RN has ordered PT eval stat. Will need PT eval in order to gain insurance authorization.   CSW has faxed pt out and contacted facility he is requesting.   CSW submitted PASSAR request and will also need this authorization prior to d/c.   Will attempt to place this pt today, but this will be very difficult to accomplish, due to multiple barriers listed above.   MD please sign FL2.   Baxter Flattery, MSW [phone]

## 2012-04-08 NOTE — Progress Notes (Signed)
   CARE MANAGEMENT NOTE 04/08/2012  Patient:  Curtis Perez, Curtis Perez   Account Number:  [id number]  Date Initiated:  04/04/2012  Documentation initiated by:  Yosselin Zoeller  Subjective/Objective Assessment:   PT S/P CABG X 3 ON 04/02/12.  PTA, PT LIVES ALONE AND IS INDEPENDENT.     Action/Plan:   MET WITH PT TO DISCUSS DC PLANS.  PT STATES HE WILL DC HOME WITH FRIEND AT DISCHARGE.   Anticipated DC Date:  04/09/2012   Anticipated DC Plan:  HOME W HOME HEALTH SERVICES      DC Planning Services  CM consult      Choice offered to / List presented to:             Status of service:  In process, will continue to follow Medicare Important Message given?   (If response is "NO", the following Medicare IM given date fields will be blank) Date Medicare IM given:   Date Additional Medicare IM given:    Discharge Disposition:    Per UR Regulation:    If discussed at Long Length of Stay Meetings, dates discussed:    Comments:

## 2012-04-08 NOTE — Evaluation (Signed)
Physical Therapy Evaluation Patient Details Name: Curtis Perez MRN: [id number] DOB: 1951-08-24 Today's Date: 04/08/2012 Time: 1435-1450 PT Time Calculation (min): 15 min  PT Assessment / Plan / Recommendation Clinical Impression  Pt s/p CABG who is independent with all mobility and does not require therapy at this time. Discussed all sternal precautions with pt and provided handout for his use. Pt verbalized and demonstrated understanding of all precautions and agreeable to no further needs at this time. Recommend continue cardiac rehab.     PT Assessment  Patent does not need any further PT services    Follow Up Recommendations  No PT follow up    Equipment Recommendations  None recommended by PT    Frequency      Precautions / Restrictions Precautions Precautions: Sternal   Pertinent Vitals/Pain No pain      Mobility  Bed Mobility Bed Mobility: Supine to Sit;Sit to Supine Supine to Sit: 7: Independent Sit to Supine: 7: Independent Transfers Transfers: Stand to Sit;Sit to Stand Sit to Stand: 7: Independent Stand to Sit: 7: Independent Ambulation/Gait Ambulation/Gait Assistance: 7: Independent Ambulation Distance (Feet): 300 Feet Assistive device: None Ambulation/Gait Assistance Details: Pt ambulating well and deferred further distance secondary to pt just ambulated 900' with cardiac rehab Gait Pattern: Within Functional Limits Gait velocity: good Stairs: Yes Stairs Assistance: 6: Modified independent (Device/Increase time) Stair Management Technique: One rail Right Number of Stairs: 6     Exercises     PT Goals    Visit Information  Last PT Received On: 04/08/12    Subjective Data  Subjective: I went to [employer]. I'm a wrecker dispatcher Patient Stated Goal: I want to go home   Prior Functioning  Home Living Lives With: Alone Type of Home: House Home Access: Stairs to enter Entrance Stairs-Number of Steps: 5 Entrance Stairs-Rails: Can reach  both;Left;Right Home Layout: One level Bathroom Shower/Tub: Engineer, manufacturing systems: Standard Home Adaptive Equipment: None Prior Function Level of Independence: Independent Able to Take Stairs?: Yes Driving: Yes Vocation: Full time employment Communication Communication: No difficulties    Cognition  Overall Cognitive Status: Appears within functional limits for tasks assessed/performed Arousal/Alertness: Awake/alert Orientation Level: Appears intact for tasks assessed Behavior During Session: [facility] for tasks performed    Extremity/Trunk Assessment Right Upper Extremity Assessment RUE ROM/Strength/Tone: Within functional levels Left Upper Extremity Assessment LUE ROM/Strength/Tone: Within functional levels Right Lower Extremity Assessment RLE ROM/Strength/Tone: Within functional levels Left Lower Extremity Assessment LLE ROM/Strength/Tone: Within functional levels   Balance Balance Balance Assessed: Yes High Level Balance High Level Balance Comments: Pt able to pick object off floor, turn 360 degrees in less than 4 sec, and step over trash can simulating shower all without LOB  End of Session PT - End of Session Activity Tolerance: Patient tolerated treatment well Patient left: in chair;with call bell/phone within reach Nurse Communication: Mobility status   Delorse Lek 04/08/2012, 2:55 PM  Delaney Meigs, PT [phone]

## 2012-04-08 NOTE — Progress Notes (Signed)
Clinical Social Work Department CLINICAL SOCIAL WORK PLACEMENT NOTE 04/08/2012  Patient:  Curtis Perez, Curtis Perez  Account Number:  [id number] Admit date:  04/02/2012  Clinical Social Worker:  Baxter Flattery, LCSWA  Date/time:  04/08/2012 10:00 AM  Clinical Social Work is seeking post-discharge placement for this patient at the following level of care:   SKILLED NURSING   (*CSW will update this form in Epic as items are completed)   04/08/2012  Patient/family provided with [facility] Department of Clinical Social Work's list of facilities offering this level of care within the geographic area requested by the patient (or if unable, by the patient's family).  04/08/2012  Patient/family informed of their freedom to choose among providers that offer the needed level of care, that participate in Medicare, Medicaid or managed care program needed by the patient, have an available bed and are willing to accept the patient.  04/08/2012  Patient/family informed of [facility]' ownership interest in [facility], as well as of the fact that they are under no obligation to receive care at this facility.  PASARR submitted to EDS on 04/08/2012 PASARR number received from EDS on 04/08/2012  FL2 transmitted to all facilities in geographic area requested by pt/family on  04/08/2012 FL2 transmitted to all facilities within larger geographic area on   Patient informed that his/her managed care company has contracts with or will negotiate with  certain facilities, including the following:   [facility], [facility]     Patient/family informed of bed offers received:  04/08/2012 Patient chooses bed at [facility] [place] Physician recommends and patient chooses bed at    Patient to be transferred to  on   Patient to be transferred to facility by   The following physician request were entered in Epic:   Additional Comments:

## 2012-04-08 NOTE — Progress Notes (Signed)
CARDIAC REHAB PHASE I   PRE:  Rate/Rhythm: 73SR  BP:  Supine:   Sitting:   Standing:    SaO2: 93%RA  MODE:  Ambulation: 990 ft   POST:  Rate/Rhythem: 77SR  BP:  Supine:   Sitting: 150/82  Standing:    SaO2: 95%RA 1415-1431 Pt up at door getting ready to walk. Walked with pt 990 ft. He walked independently with steady gait. Tolerated well.   Duanne Limerick

## 2012-04-08 NOTE — Progress Notes (Signed)
UR Completed.  Ajanay Farve Jane [phone] 04/08/2012  

## 2012-04-08 NOTE — Progress Notes (Addendum)
                   [address]            [place] [address]          [phone]     6 Days Post-Op  Procedure(s) (LRB): CORONARY ARTERY BYPASS GRAFTING (CABG) (N/A) Subjective: Feels well  Objective  Telemetry SR/SB   Temp:  [97.1 F (36.2 C)-97.7 F (36.5 C)] 97.1 F (36.2 C) (04/22 0420) Pulse Rate:  [54-66] 66  (04/22 0420) Resp:  [20] 20  (04/22 0420) BP: (103-158)/(50-86) 158/86 mmHg (04/22 0420) SpO2:  [94 %-97 %] 97 % (04/22 0420) Weight:  [251 lb 1.7 oz (113.9 kg)] 251 lb 1.7 oz (113.9 kg) (04/22 0418)   Intake/Output Summary (Last 24 hours) at 04/08/12 0804 Last data filed at 04/07/12 1812  Gross per 24 hour  Intake    720 ml  Output      0 ml  Net    720 ml       General appearance: alert, cooperative and no distress Heart: regular rate and rhythm and S1, S2 normal Lungs: clear to auscultation bilaterally Abdomen: soft, non-tender; bowel sounds normal; no masses,  no organomegaly Extremities: min edema Wound: incisions healing well  Lab Results: No results found for this basename: NA:2,K:2,CL:2,CO2:2,GLUCOSE:2,BUN:2,CREATININE:2,CALCIUM:2,MG:2,PHOS:2 in the last 72 hours No results found for this basename: AST:2,ALT:2,ALKPHOS:2,BILITOT:2,PROT:2,ALBUMIN:2 in the last 72 hours No results found for this basename: LIPASE:2,AMYLASE:2 in the last 72 hours No results found for this basename: WBC:2,NEUTROABS:2,HGB:2,HCT:2,MCV:2,PLT:2 in the last 72 hours No results found for this basename: CKTOTAL:4,CKMB:4,TROPONINI:4 in the last 72 hours No components found with this basename: POCBNP:3 No results found for this basename: DDIMER in the last 72 hours No results found for this basename: HGBA1C in the last 72 hours No results found for this basename: CHOL,HDL,LDLCALC,TRIG,CHOLHDL in the last 72 hours No results found for this basename: TSH,T4TOTAL,FREET3,T3FREE,THYROIDAB in the last 72 hours No results found for this basename:  VITAMINB12,FOLATE,FERRITIN,TIBC,IRON,RETICCTPCT in the last 72 hours  Medications: Scheduled    . acetaminophen  1,000 mg Oral Q6H   Or  . acetaminophen (TYLENOL) oral liquid 160 mg/5 mL  975 mg Per Tube Q6H  . aspirin EC  325 mg Oral Daily  . atenolol  50 mg Oral Daily  . bisacodyl  10 mg Oral Daily   Or  . bisacodyl  10 mg Rectal Daily  . docusate sodium  200 mg Oral Daily  . furosemide  40 mg Oral Daily  . insulin aspart  0-24 Units Subcutaneous TID AC & HS  . lisinopril  10 mg Oral Daily  . pantoprazole  40 mg Oral Q1200  . potassium chloride  20 mEq Oral Daily  . simvastatin  20 mg Oral q1800  . sodium chloride  3 mL Intravenous Q12H  . vitamin B-12  1,000 mcg Oral Daily     Radiology/Studies:  No results found.  INR: Will add last result for INR, ABG once components are confirmed Will add last 4 CBG results once components are confirmed  Assessment/Plan: S/P Procedure(s) (LRB): CORONARY ARTERY BYPASS GRAFTING (CABG) (N/A)   1. Doing well stable for discharge if bed available at snf  LOS: 6 days    GOLD,WAYNE E 4/22/20138:04 AM    I have seen and examined the patient and agree with the assessment and plan as outlined.  Keilin Gamboa H 04/08/2012 1:28 PM

## 2012-04-08 NOTE — Plan of Care (Signed)
Problem: Phase III Progression Outcomes Goal: Time patient transferred to PCTU/Telemetry POD Outcome: Completed/Met Date Met:  04/04/12 1100

## 2012-04-08 NOTE — Progress Notes (Signed)
Clinical Social Work Department BRIEF PSYCHOSOCIAL ASSESSMENT 04/08/2012  Patient:  Curtis Perez, Curtis Perez     Account Number:  [id number]     Admit date:  04/02/2012  Clinical Social Worker:  Mee Hives  Date/Time:  04/08/2012 10:00 AM  Referred by:  RN  Date Referred:  04/08/2012 Referred for  SNF Placement   Other Referral:   Interview type:  Patient Other interview type:    PSYCHOSOCIAL DATA Living Status:  ALONE Admitted from facility:   Level of care:   Primary support name:   Primary support relationship to patient:   Degree of support available:    CURRENT CONCERNS Current Concerns  Post-Acute Placement   Other Concerns:    SOCIAL WORK ASSESSMENT / PLAN Pt requesting ST SNF at time of d/c as he does not have 24 hour care available. Pt had planned to d/c home with a friend, but apparently this plan has fallen through. Pt ready to d/c but has BCBS PPO which will require preauth and PT eval is recommending "no follow up." This may hinder placement process. CSW will follow up tomorrow.   Assessment/plan status:  Information/Referral to [facility] Other assessment/ plan:   SNF at d/c   Information/referral to community resources:    PATIENT'S/FAMILY'S RESPONSE TO PLAN OF CARE: Pt appreciative, although seems agitated to be delayed in d/c from hospital

## 2012-04-09 LAB — GLUCOSE, CAPILLARY
Glucose-Capillary: 118 mg/dL — ABNORMAL HIGH (ref 70–99)
Glucose-Capillary: 100 mg/dL — ABNORMAL HIGH (ref 70–99)

## 2012-04-09 NOTE — Progress Notes (Signed)
CSW advised [employer]BS denied SNF request. RN discussing options with pt to appeal. CSW available to facilitate appeals process if pt chooses to do so, although pt very restless and states he is leaving today.  RN to consult with RNCM if home health is needed.  Baxter Flattery, MSW [phone]

## 2012-04-09 NOTE — Progress Notes (Signed)
Cardiac Rehab 1015 Pt does not want to walk at this time because he is waiting on phone call about facility. Encouraged 3 walks today if not d/ced. Pt can walk independently. Has steady gait. We will sign off. Dewie Ahart DunlapRN

## 2012-04-09 NOTE — Progress Notes (Signed)
Pt being dc'd home alone with HH to follow, arranged by case mgmt.  All instructions and prescriptions given and reviewed in detail.  Follow up appointments in place and emphasized.

## 2012-04-09 NOTE — Progress Notes (Addendum)
                   [address]            [place] [address]          [phone]     7 Days Post-Op  Procedure(s) (LRB): CORONARY ARTERY BYPASS GRAFTING (CABG) (N/A) Subjective: He continues to feel well, no new complaints  Objective  Telemetry SR/SB, occas PVC's  Temp:  [97.2 F (36.2 C)-98.5 F (36.9 C)] 97.2 F (36.2 C) (04/23 0606) Pulse Rate:  [62-66] 65  (04/23 0606) Resp:  [16-19] 16  (04/23 0606) BP: (107-144)/(71-78) 107/71 mmHg (04/23 0606) SpO2:  [94 %-96 %] 94 % (04/23 0606) Weight:  [247 lb 4.8 oz (112.175 kg)] 247 lb 4.8 oz (112.175 kg) (04/23 0606)   Intake/Output Summary (Last 24 hours) at 04/09/12 0729 Last data filed at 04/09/12 0020  Gross per 24 hour  Intake   1200 ml  Output      0 ml  Net   1200 ml       General appearance: alert, cooperative and no distress Heart: regular rate and rhythm and S1, S2 normal Lungs: clear to auscultation bilaterally Abdomen: benign Extremities: minor LE edema Wound: incisions healing well  Lab Results: No results found for this basename: NA:2,K:2,CL:2,CO2:2,GLUCOSE:2,BUN:2,CREATININE:2,CALCIUM:2,MG:2,PHOS:2 in the last 72 hours No results found for this basename: AST:2,ALT:2,ALKPHOS:2,BILITOT:2,PROT:2,ALBUMIN:2 in the last 72 hours No results found for this basename: LIPASE:2,AMYLASE:2 in the last 72 hours No results found for this basename: WBC:2,NEUTROABS:2,HGB:2,HCT:2,MCV:2,PLT:2 in the last 72 hours No results found for this basename: CKTOTAL:4,CKMB:4,TROPONINI:4 in the last 72 hours No components found with this basename: POCBNP:3 No results found for this basename: DDIMER in the last 72 hours No results found for this basename: HGBA1C in the last 72 hours No results found for this basename: CHOL,HDL,LDLCALC,TRIG,CHOLHDL in the last 72 hours No results found for this basename: TSH,T4TOTAL,FREET3,T3FREE,THYROIDAB in the last 72 hours No results found for this basename:  VITAMINB12,FOLATE,FERRITIN,TIBC,IRON,RETICCTPCT in the last 72 hours  Medications: Scheduled    . aspirin EC  325 mg Oral Daily  . atenolol  50 mg Oral Daily  . bisacodyl  10 mg Oral Daily   Or  . bisacodyl  10 mg Rectal Daily  . docusate sodium  200 mg Oral Daily  . furosemide  40 mg Oral Daily  . insulin aspart  0-24 Units Subcutaneous TID AC & HS  . lisinopril  10 mg Oral Daily  . pantoprazole  40 mg Oral Q1200  . potassium chloride  20 mEq Oral Daily  . simvastatin  20 mg Oral q1800  . sodium chloride  3 mL Intravenous Q12H  . vitamin B-12  1,000 mcg Oral Daily     Radiology/Studies:  No results found.  INR: Will add last result for INR, ABG once components are confirmed Will add last 4 CBG results once components are confirmed  Assessment/Plan: S/P Procedure(s) (LRB): CORONARY ARTERY BYPASS GRAFTING (CABG) (N/A)  1. Continues to progress nicely, cont. Rehab, awaits placement  LOS: 7 days    Curtis Perez,Curtis Perez 4/23/20137:29 AM    I have seen and examined the patient and agree with the assessment and plan as outlined.  Zamyia Gowell H 04/09/2012 9:19 AM

## 2012-04-09 NOTE — Discharge Summary (Signed)
I agree with the above discharge summary and plan for follow-up.   Vivika Poythress H 04/09/2012 3:26 PM

## 2012-04-09 NOTE — Discharge Summary (Signed)
                   [address]            [place] [address]          [phone]     Addendum:  The patient's request for a skilled nursing facility has been denied by [facility]. He is anxious to go home and states that he does have people who can assist him. We will also attempt to arrange home health nurse to come and check on him. We will discharge him today. He continues to do quite well.

## 2012-04-09 NOTE — Progress Notes (Signed)
Pt to d/c home with home health. CSW signing off.  Baxter Flattery, MSW [phone]

## 2012-04-10 NOTE — Progress Notes (Signed)
   CARE MANAGEMENT NOTE 04/10/2012  Patient:  Curtis Perez, Curtis Perez   Account Number:  [id number]  Date Initiated:  04/04/2012  Documentation initiated by:  Shelaine Frie  Subjective/Objective Assessment:   PT S/P CABG X 3 ON 04/02/12.  PTA, PT LIVES ALONE AND IS INDEPENDENT.     Action/Plan:   MET WITH PT TO DISCUSS DC PLANS.  PT STATES HE WILL DC HOME WITH FRIEND AT DISCHARGE.   Anticipated DC Date:  04/09/2012   Anticipated DC Plan:  SKILLED NURSING FACILITY  In-house referral  Clinical Social Worker      DC Associate Professor  CM consult      [facility] Choice  HOME HEALTH   Choice offered to / List presented to:  C-1 Patient        HH arranged  HH-1 RN      [facility] agency  [facility]   Status of service:  Completed, signed off Medicare Important Message given?   (If response is "NO", the following Medicare IM given date fields will be blank) Date Medicare IM given:   Date Additional Medicare IM given:    Discharge Disposition:  HOME W HOME HEALTH SERVICES  Per UR Regulation:    If discussed at Long Length of Stay Meetings, dates discussed:    Comments:  04/09/12 Chancy Smigiel,RN,BSN 1530 INSURANCE HAS DENIED SNF STAY.  PT AND MD AGREEABLE TO HOME WITH HOME HEALTH CARE SET UP.  REFERRAL TO [facility] FOR HHRN FOLLOW UP.  START OF CARE 24-48H POST DC DATE. Phone #[phone]   04/08/12 Auset Fritzler,RN,BSN 1030 ALERTED BY PT'S NURSE THAT PT STATES HIS PLAN TO DC HOME WITH FRIEND HAS FALLEN THROUGH; WILL NEED SNF PLACEMENT NOW.  WILL OBTAIN PHYSICAL THERAPY CONSULT AND CONSULT CSW TO FACILITATE DC TO SNF ASAP, AS PT IS READY FOR DC.

## 2012-04-16 ENCOUNTER — Encounter: Payer: Self-pay | Admitting: Thoracic Surgery (Cardiothoracic Vascular Surgery)

## 2012-04-17 ENCOUNTER — Ambulatory Visit: Payer: BC Managed Care – PPO | Admitting: Cardiovascular Disease

## 2012-04-25 ENCOUNTER — Other Ambulatory Visit: Payer: Self-pay | Admitting: Thoracic Surgery (Cardiothoracic Vascular Surgery)

## 2012-04-25 DIAGNOSIS — I2510 Atherosclerotic heart disease of native coronary artery without angina pectoris: Secondary | ICD-10-CM

## 2012-04-26 ENCOUNTER — Encounter: Payer: Self-pay | Admitting: Nurse Practitioner

## 2012-04-26 ENCOUNTER — Ambulatory Visit (INDEPENDENT_AMBULATORY_CARE_PROVIDER_SITE_OTHER): Payer: BC Managed Care – PPO | Admitting: Nurse Practitioner

## 2012-04-26 VITALS — BP 118/68 | HR 56 | Ht 74.5 in | Wt 247.0 lb

## 2012-04-26 DIAGNOSIS — Z720 Tobacco use: Secondary | ICD-10-CM

## 2012-04-26 DIAGNOSIS — I2510 Atherosclerotic heart disease of native coronary artery without angina pectoris: Secondary | ICD-10-CM

## 2012-04-26 DIAGNOSIS — F17200 Nicotine dependence, unspecified, uncomplicated: Secondary | ICD-10-CM

## 2012-04-26 DIAGNOSIS — D649 Anemia, unspecified: Secondary | ICD-10-CM

## 2012-04-26 DIAGNOSIS — I10 Essential (primary) hypertension: Secondary | ICD-10-CM

## 2012-04-26 LAB — BASIC METABOLIC PANEL
BUN: 11 mg/dL (ref 6–23)
CO2: 26 mEq/L (ref 19–32)
Calcium: 9.3 mg/dL (ref 8.4–10.5)
Chloride: 99 mEq/L (ref 96–112)
Creatinine, Ser: 1 mg/dL (ref 0.4–1.5)
GFR: 79.62 mL/min (ref >60.00)
Glucose, Bld: 89 mg/dL (ref 70–99)
Potassium: 4.4 mEq/L (ref 3.5–5.1)
Sodium: 136 mEq/L (ref 135–145)

## 2012-04-26 LAB — CBC WITH DIFFERENTIAL/PLATELET
Basophils Absolute: 0.1 10*3/uL (ref 0.0–0.1)
Basophils Relative: 1.4 % (ref 0.0–3.0)
Eosinophils Absolute: 0.4 10*3/uL (ref 0.0–0.7)
Eosinophils Relative: 5.7 % — ABNORMAL HIGH (ref 0.0–5.0)
HCT: 34.6 % — ABNORMAL LOW (ref 39.0–52.0)
Hemoglobin: 11.2 g/dL — ABNORMAL LOW (ref 13.0–17.0)
Lymphocytes Relative: 20 % (ref 12.0–46.0)
Lymphs Abs: 1.4 10*3/uL (ref 0.7–4.0)
MCHC: 32.4 g/dL (ref 30.0–36.0)
MCV: 82.7 fl (ref 78.0–100.0)
Monocytes Absolute: 0.5 10*3/uL (ref 0.1–1.0)
Monocytes Relative: 7.4 % (ref 3.0–12.0)
Neutro Abs: 4.6 10*3/uL (ref 1.4–7.7)
Neutrophils Relative %: 65.5 % (ref 43.0–77.0)
Platelets: 365 10*3/uL (ref 150.0–400.0)
RBC: 4.19 Mil/uL — ABNORMAL LOW (ref 4.22–5.81)
RDW: 15.4 % — ABNORMAL HIGH (ref 11.5–14.6)
WBC: 7 10*3/uL (ref 4.5–10.5)

## 2012-04-26 NOTE — Patient Instructions (Signed)
Stay on your current medicines.  We are checking your labs today  I want to see you in 1 month  Try to work on your smoking  Call the [facility] office at [phone] if you have any questions, problems or concerns.

## 2012-04-26 NOTE — Assessment & Plan Note (Signed)
Patient is now s/p CABG. He is doing well. Unfortunately, has already returned to smoking. Does not seem too motivated to stop at this time. He sees Dr. Cornelius Moras next week. I have referred him to start cardiac rehab. Will check BMET and CBC today. I will see him back in a month. Patient is agreeable to this plan and will call if any problems develop in the interim.

## 2012-04-26 NOTE — Progress Notes (Signed)
Alfred Levins Date of Birth: 1951/07/24 Medical Record #[id number]  History of Present Illness: Decklin is seen today for a post hospital visit. He is seen for Dr. Elease Hashimoto. He has known CAD with remote MI and PCI in 2004, now s/p CABG. Other problems include HTN, HLD and ongoing tobacco abuse. He has had some bradycardia and thrombocytopenia as well.   He comes in today. He is here alone. He is doing well. Unfortunately, he is already back smoking. No chest pain. Not short of breath. Appetite is ok. Bowels are working. He is not driving. Still using some narcotic at night. Has no real complaint. Has been referred to cardiac rehab. Notes his chest feels a little "sensitive" but overall is ok.   Current Outpatient Prescriptions on File Prior to Visit  Medication Sig Dispense Refill  . aspirin 325 MG tablet Take 325 mg by mouth daily.      Marland Kitchen atenolol (TENORMIN) 50 MG tablet Take 50 mg by mouth daily.      Jennette Banker Sodium 30-100 MG CAPS Take 100 mg by mouth daily. Stool Softner      . Cyanocobalamin (VITAMIN B-12 PO) Take 1,000 mcg by mouth daily.       Marland Kitchen lisinopril (PRINIVIL,ZESTRIL) 10 MG tablet Take 1 tablet (10 mg total) by mouth daily.  30 tablet  1  . Magnesium 500 MG CAPS Take 500 mg by mouth daily.      . Omega-3 Fatty Acids (FISH OIL PO) Take 2,000 mg by mouth daily.      . pravastatin (PRAVACHOL) 40 MG tablet Take 40 mg by mouth daily.      . Vitamin D, Ergocalciferol, (DRISDOL) 50000 UNITS CAPS Take 50,000 Units by mouth See admin instructions. Taking Four Times per Week. mon wed fri sat        No Known Allergies  Past Medical History  Diagnosis Date  . Hypertension   . Hyperlipidemia   . Bradycardia   . Myocardial infarction     2004  . Tobacco abuse 03/28/2012  . 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 CAD (coronary artery disease)     remote MI in 2004 with PCI; s/p CABG x 3 03/2012    Past Surgical  History  Procedure Date  . Tonsillectomy   . Knee surgery   . Back surgery   . Coronary angioplasty with stent placement     about 8 years ago at [facility]  . Colonoscopy   . Coronary artery bypass graft 04/02/2012    Procedure: CORONARY ARTERY BYPASS GRAFTING (CABG);  Surgeon: Purcell Nails, MD;  Location: [facility] OR;  Service: Open Heart Surgery;  Laterality: N/A;  On pump, times three graphs using endoscopically harvested right greater saphenous vein and left internal mammary artery.     History  Smoking status  . Current Everyday Smoker -- 2.0 packs/day for 40 years  Smokeless tobacco  . Never Used    History  Alcohol Use No    Rarely    History reviewed. No pertinent family history.  Review of Systems: The review of systems is per the HPI.  All other systems were reviewed and are negative.  Physical Exam: BP 118/68  Pulse 56  Ht 6' 2.5" (1.892 m)  Wt 247 lb (112.038 kg)  BMI 31.29 kg/m2 Patient is pleasant and in no acute distress. Skin is warm and  dry. Color is a little pale.  HEENT is unremarkable except for poor dentition. Normocephalic/atraumatic. PERRL. Sclera are nonicteric. Neck is supple. No masses. No JVD. Lungs are clear. Cardiac exam shows a regular rate and rhythm. Sternum looks ok.  Abdomen is soft. Extremities are without edema. Gait and ROM are intact. No gross neurologic deficits noted.   LABORATORY DATA: Labs are pending.  EKG today shows sinus bradycardia. Inferior infarct with diffuse T wave changes.   Assessment / Plan:

## 2012-04-26 NOTE — Assessment & Plan Note (Signed)
Blood pressure looks ok. No change in current therapy. 

## 2012-04-26 NOTE — Assessment & Plan Note (Signed)
Smoking cessation is encouraged 

## 2012-04-29 ENCOUNTER — Ambulatory Visit
Admission: RE | Admit: 2012-04-29 | Discharge: 2012-04-29 | Disposition: A | Discharge status: Home / Self Care | Payer: BC Managed Care – PPO | Source: Ambulatory Visit | Attending: Thoracic Surgery (Cardiothoracic Vascular Surgery) | Admitting: Thoracic Surgery (Cardiothoracic Vascular Surgery)

## 2012-04-29 ENCOUNTER — Encounter: Payer: Self-pay | Admitting: Thoracic Surgery (Cardiothoracic Vascular Surgery)

## 2012-04-29 ENCOUNTER — Ambulatory Visit (INDEPENDENT_AMBULATORY_CARE_PROVIDER_SITE_OTHER): Payer: Self-pay | Admitting: Thoracic Surgery (Cardiothoracic Vascular Surgery)

## 2012-04-29 VITALS — BP 137/82 | HR 69 | Resp 16 | Ht 75.0 in | Wt 240.0 lb

## 2012-04-29 DIAGNOSIS — Z951 Presence of aortocoronary bypass graft: Secondary | ICD-10-CM

## 2012-04-29 DIAGNOSIS — I2510 Atherosclerotic heart disease of native coronary artery without angina pectoris: Secondary | ICD-10-CM

## 2012-04-29 NOTE — Progress Notes (Signed)
[address]            [place] [address]          [phone]     CARDIOTHORACIC SURGERY OFFICE NOTE  Referring Provider is Nahser, Deloris Ping, MD PCP is Nadean Corwin, MD, MD   HPI:  Patient returns for routine followup status post coronary artery bypass grafting on 04/02/2012. His postoperative recovery has been uneventful. Since hospital discharge the patient has continued to recover. Unfortunately has gone back to smoking cigarettes. He reports that he is getting along well. He has mild residual soreness in his chest. He is no longer taking any sort of pain relievers. His breathing is good. His eating well. His energy level is gradually improving. He plans to start the cardiac rehabilitation program soon. He has no complaints.   Current Outpatient Prescriptions  Medication Sig Dispense Refill  . aspirin 325 MG tablet Take 325 mg by mouth daily.      Marland Kitchen atenolol (TENORMIN) 50 MG tablet Take 50 mg by mouth daily.      Jennette Banker Sodium 30-100 MG CAPS Take 100 mg by mouth daily. Stool Softner      . Cyanocobalamin (VITAMIN B-12 PO) Take 1,000 mcg by mouth daily.       Marland Kitchen lisinopril (PRINIVIL,ZESTRIL) 10 MG tablet Take 1 tablet (10 mg total) by mouth daily.  30 tablet  1  . Magnesium 500 MG CAPS Take 500 mg by mouth daily.      . Omega-3 Fatty Acids (FISH OIL PO) Take 2,000 mg by mouth daily.      . pravastatin (PRAVACHOL) 40 MG tablet Take 40 mg by mouth daily.      . Vitamin D, Ergocalciferol, (DRISDOL) 50000 UNITS CAPS Take 50,000 Units by mouth See admin instructions. Taking Four Times per Week. mon wed fri sat          Physical Exam:   BP 137/82  Pulse 69  Resp 16  Ht 6\' 3"  (1.905 m)  Wt 240 lb (108.863 kg)  BMI 30.00 kg/m2  SpO2 98%  General:  Well-appearing  Chest:   Clear to auscultation  CV:   Regular rate and rhythm  Incisions:  Clean dry and healing nicely  Abdomen:  Soft and nontender  Extremities:  Warm  and well-perfused with no lower extremity edema  Diagnostic Tests:  *RADIOLOGY REPORT*  Clinical Data: Heart surgery on 04/02/2012, follow-up, smoking  history  CHEST - 2 VIEW 04/29/2012 Comparison: Chest x-ray of 04/04/2012  Findings: Aeration of the lungs has improved. The linear  atelectasis in the left mid lung has resolved. Only a small left  pleural effusion remains. Mild cardiomegaly is stable. Median  sternotomy sutures are noted.  IMPRESSION:  Improved aeration. Only a small left effusion remains.  Original Report Authenticated By: Juline Patch, M.D.    Impression:  Patient is doing well one month status post coronary artery bypass grafting. Unfortunately has gone back to smoking cigarettes.   Plan:  I've encouraged patient to continue to gradually increase his physical activity as tolerated with his only limitation at this point remaining that he refrain from heavy lifting or strenuous use of his arms or shoulders for least another 2 months. We have again discussed how important it would be for him to find a way to quit smoking. We have not made any changes in his current  medications. I think he can resume driving an automobile. I have encouraged him to get and rolled in the cardiac rehabilitation program. We discussed a plan for going back to work. He will be to more months before he can go back to work without any sort of restrictions, but I think he could go back with some limitations by June 1. All of his questions been addressed.   Salvatore Decent. Cornelius Moras, MD 04/29/2012 12:23 PM

## 2012-04-29 NOTE — Patient Instructions (Signed)
The patient has been reminded to continue to avoid any heavy lifting or strenuous use of arms or shoulders for at least a total of three months from the time of surgery. The patient has been instructed that they may return driving an automobile as long as they are no longer requiring oral narcotic pain relievers during the daytime.  They have been advised to start driving short distances during the daylight and gradually increase from there as they feel comfortable.  

## 2012-05-02 ENCOUNTER — Encounter (HOSPITAL_COMMUNITY)
Admission: RE | Admit: 2012-05-02 | Discharge: 2012-05-02 | Disposition: A | Discharge status: Home / Self Care | Payer: BC Managed Care – PPO | Source: Ambulatory Visit | Attending: Cardiovascular Disease | Admitting: Cardiovascular Disease

## 2012-05-02 DIAGNOSIS — Z951 Presence of aortocoronary bypass graft: Secondary | ICD-10-CM | POA: Insufficient documentation

## 2012-05-02 DIAGNOSIS — Z5189 Encounter for other specified aftercare: Secondary | ICD-10-CM | POA: Insufficient documentation

## 2012-05-02 DIAGNOSIS — I10 Essential (primary) hypertension: Secondary | ICD-10-CM | POA: Insufficient documentation

## 2012-05-02 DIAGNOSIS — I252 Old myocardial infarction: Secondary | ICD-10-CM | POA: Insufficient documentation

## 2012-05-02 DIAGNOSIS — Z7982 Long term (current) use of aspirin: Secondary | ICD-10-CM | POA: Insufficient documentation

## 2012-05-02 DIAGNOSIS — I498 Other specified cardiac arrhythmias: Secondary | ICD-10-CM | POA: Insufficient documentation

## 2012-05-02 DIAGNOSIS — E785 Hyperlipidemia, unspecified: Secondary | ICD-10-CM | POA: Insufficient documentation

## 2012-05-02 DIAGNOSIS — I2510 Atherosclerotic heart disease of native coronary artery without angina pectoris: Secondary | ICD-10-CM | POA: Insufficient documentation

## 2012-05-02 DIAGNOSIS — I200 Unstable angina: Secondary | ICD-10-CM | POA: Insufficient documentation

## 2012-05-02 DIAGNOSIS — Z9861 Coronary angioplasty status: Secondary | ICD-10-CM | POA: Insufficient documentation

## 2012-05-02 DIAGNOSIS — F17200 Nicotine dependence, unspecified, uncomplicated: Secondary | ICD-10-CM | POA: Insufficient documentation

## 2012-05-02 NOTE — Progress Notes (Signed)
Cardiac Rehab Medication Review by a Pharmacist  Does the patient  feel that his/her medications are working for him/her?  yes  Has the patient been experiencing any side effects to the medications prescribed?  no  Does the patient measure his/her own blood pressure or blood glucose at home?  no   Does the patient have any problems obtaining medications due to transportation or finances?   no  Understanding of regimen: excellent Understanding of indications: good Potential of compliance: excellent    Pharmacist comments: Pt states he does not have refills on his lisinopril.  He has about 2 weeks supply left and will need refills called/faxed to [facility] on [address].      Wynonia Hazard 05/02/2012 8:42 AM

## 2012-05-06 ENCOUNTER — Encounter (HOSPITAL_COMMUNITY): Payer: Self-pay

## 2012-05-06 ENCOUNTER — Encounter (HOSPITAL_COMMUNITY)
Admission: RE | Admit: 2012-05-06 | Discharge: 2012-05-06 | Disposition: A | Discharge status: Home / Self Care | Payer: BC Managed Care – PPO | Source: Ambulatory Visit | Attending: Cardiovascular Disease | Admitting: Cardiovascular Disease

## 2012-05-06 NOTE — Progress Notes (Signed)
Pt started cardiac rehab today.  Pt tolerated light exercise without difficulty.  Pt asymptomatic.  VSS, Telemetry-NSR, occasional PVC.  Pt states he is currently still smoking however has cut back to <1ppd down from 2ppd.  Pt declined offer for smoking cessation counselor.  Pt states he is not ready to quit at this time.  Offered emotional and counseling support. Pt also reports he has had some depression associated with his illness.  Again offered emotional and counseling support.  Will continue to monitor the patient throughout  the program.  Pt oriented to exercise equipment and routine.  Understanding verbalized.

## 2012-05-08 ENCOUNTER — Encounter (HOSPITAL_COMMUNITY)
Admission: RE | Admit: 2012-05-08 | Discharge: 2012-05-08 | Disposition: A | Discharge status: Home / Self Care | Payer: BC Managed Care – PPO | Source: Ambulatory Visit | Attending: Cardiovascular Disease | Admitting: Cardiovascular Disease

## 2012-05-08 NOTE — Progress Notes (Signed)
Curtis Perez 61 y.o. male       Nutrition Screen                                                                    YES  NO Do you live in a nursing home?  X   Do you eat out more than 3 times/week?    X If yes, how many times per week do you eat out?  Do you have food allergies?   X If yes, what are you allergic to?  Have you gained or lost more than 10 lbs without trying?               X If yes, how much weight have you lost and over what time period?  Do you want to lose weight?    X  If yes, what is a goal weight or amount of weight you would like to lose? 230 lb  Do you eat alone most of the time?  X    Do you eat less than 2 meals/day?  X If yes, how many meals do you eat?    Do you drink more than 3 alcohol drinks/day?  X If yes, how many drinks per day?   Are you having trouble with constipation? * X  If yes, what are you doing to help relieve constipation?  Do you have financial difficulties with buying food?*    X   Are you experiencing regular nausea/ vomiting?*     X   Do you have a poor appetite? *                                        X   Do you have trouble chewing/swallowing? *   X    Pt with diagnoses of:  X CABG              X Stent/ PTCA X COPD X Dyslipidemia  / HDL< 40 / LDL>70 / High TG      X %  Body fat >goal / Body Mass Index >25 X HTN / BP >120/80 X MI X Pre-diabetes       Pt Risk Score   7       Diagnosis Risk Score  40       Total Risk Score   47                        X High Risk                Low Risk    HT: 73" Ht Readings from Last 1 Encounters:  05/02/12 6\' 1"  (1.854 m)    WT:   247.5 lb (112.5 kg) Wt Readings from Last 3 Encounters:  05/02/12 248 lb 0.3 oz (112.5 kg)  04/29/12 240 lb (108.863 kg)  04/26/12 247 lb (112.038 kg)     IBW 83.6 135%IBW BMI 32.7 32.2%body fat  Meds reviewed: Vitamin B12, Magnesium, fish oil, vitamin D Past Medical History  Diagnosis Date  . Hypertension   . Hyperlipidemia   . Bradycardia   . Myocardial  infarction     2004  . Tobacco abuse 03/28/2012  . 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 CAD (coronary artery disease)     remote MI in 2004 with PCI; s/p CABG x 3 03/2012       Activity level: Pt is sedentary  Wt goal: 224-236 lb ( 101.8-107.3 kg) Current tobacco use? Yes     If pt using tobacco, is pt taking a vit C? No  Food/Drug Interaction? No Labs:  Lipid Panel  No results found for this basename: chol, trig, hdl, cholhdl, vldl, ldlcalc   Lab Results  Component Value Date   HGBA1C 5.8* 03/29/2012   04/26/12 Glucose 89  LDL goal: < 70       MI and > 2:      HTN, > 60 yo male, tobacco Estimated Daily Nutrition Needs for: ? wt loss  1800-2300 Kcal , Total Fat 50-60gm, Saturated Fat 13-18 gm, Trans Fat 1.8-2.3 gm,  Sodium less than 1500 mg

## 2012-05-10 ENCOUNTER — Encounter (HOSPITAL_COMMUNITY)
Admission: RE | Admit: 2012-05-10 | Discharge: 2012-05-10 | Disposition: A | Discharge status: Home / Self Care | Payer: BC Managed Care – PPO | Source: Ambulatory Visit | Attending: Cardiovascular Disease | Admitting: Cardiovascular Disease

## 2012-05-15 ENCOUNTER — Encounter (HOSPITAL_COMMUNITY)
Admission: RE | Admit: 2012-05-15 | Discharge: 2012-05-15 | Disposition: A | Discharge status: Home / Self Care | Payer: BC Managed Care – PPO | Source: Ambulatory Visit | Attending: Cardiovascular Disease | Admitting: Cardiovascular Disease

## 2012-05-16 ENCOUNTER — Telehealth: Payer: Self-pay | Admitting: *Deleted

## 2012-05-16 ENCOUNTER — Telehealth: Payer: Self-pay | Admitting: Cardiovascular Disease

## 2012-05-16 NOTE — Telephone Encounter (Signed)
He just had bypass surgery. I doubt that this is cardiac.

## 2012-05-16 NOTE — Telephone Encounter (Signed)
Pt called/ see note re unusual chest pain, two notes opened by error

## 2012-05-16 NOTE — Telephone Encounter (Signed)
Pt calling re pain he is having

## 2012-05-16 NOTE — Telephone Encounter (Signed)
Yesterday pt states he felt off- went to cardiac rehab then had errands. States he had serious pain in upper torso front and back that went up to bilateral arms that lasted 1 hour, had sweats, had to have a BM during pain episode, denies nausea/sob. Pt tried nitro 1 time without relief. Pain stopped on its own. Pt stated he had this pain prior to his CABG, told pt to go to ER if happens again/ Dr Elease Hashimoto will review and advise tomorrow, pt agreed to plan, pt currently feeling well and in no distress.

## 2012-05-16 NOTE — Telephone Encounter (Signed)
Information given, pt to call with further questions or concerns, pt agreed to plan.

## 2012-05-17 ENCOUNTER — Encounter (HOSPITAL_COMMUNITY)
Admission: RE | Admit: 2012-05-17 | Discharge: 2012-05-17 | Disposition: A | Discharge status: Home / Self Care | Payer: BC Managed Care – PPO | Source: Ambulatory Visit | Attending: Cardiovascular Disease | Admitting: Cardiovascular Disease

## 2012-05-17 ENCOUNTER — Ambulatory Visit: Payer: BC Managed Care – PPO | Admitting: Cardiovascular Disease

## 2012-05-20 ENCOUNTER — Encounter (HOSPITAL_COMMUNITY): Payer: BC Managed Care – PPO

## 2012-05-22 ENCOUNTER — Encounter (HOSPITAL_COMMUNITY)
Admission: RE | Admit: 2012-05-22 | Discharge: 2012-05-22 | Disposition: A | Discharge status: Home / Self Care | Payer: BC Managed Care – PPO | Source: Ambulatory Visit | Attending: Cardiovascular Disease | Admitting: Cardiovascular Disease

## 2012-05-22 DIAGNOSIS — I200 Unstable angina: Secondary | ICD-10-CM | POA: Insufficient documentation

## 2012-05-22 DIAGNOSIS — I498 Other specified cardiac arrhythmias: Secondary | ICD-10-CM | POA: Insufficient documentation

## 2012-05-22 DIAGNOSIS — Z5189 Encounter for other specified aftercare: Secondary | ICD-10-CM | POA: Insufficient documentation

## 2012-05-22 DIAGNOSIS — I252 Old myocardial infarction: Secondary | ICD-10-CM | POA: Insufficient documentation

## 2012-05-22 DIAGNOSIS — Z951 Presence of aortocoronary bypass graft: Secondary | ICD-10-CM | POA: Insufficient documentation

## 2012-05-22 DIAGNOSIS — I2510 Atherosclerotic heart disease of native coronary artery without angina pectoris: Secondary | ICD-10-CM | POA: Insufficient documentation

## 2012-05-22 DIAGNOSIS — F17200 Nicotine dependence, unspecified, uncomplicated: Secondary | ICD-10-CM | POA: Insufficient documentation

## 2012-05-22 DIAGNOSIS — I10 Essential (primary) hypertension: Secondary | ICD-10-CM | POA: Insufficient documentation

## 2012-05-22 DIAGNOSIS — E785 Hyperlipidemia, unspecified: Secondary | ICD-10-CM | POA: Insufficient documentation

## 2012-05-22 DIAGNOSIS — Z7982 Long term (current) use of aspirin: Secondary | ICD-10-CM | POA: Insufficient documentation

## 2012-05-22 DIAGNOSIS — Z9861 Coronary angioplasty status: Secondary | ICD-10-CM | POA: Insufficient documentation

## 2012-05-22 LAB — GLUCOSE, CAPILLARY: Glucose-Capillary: 102 mg/dL — ABNORMAL HIGH (ref 70–99)

## 2012-05-24 ENCOUNTER — Encounter (HOSPITAL_COMMUNITY)
Admission: RE | Admit: 2012-05-24 | Discharge: 2012-05-24 | Disposition: A | Discharge status: Home / Self Care | Payer: BC Managed Care – PPO | Source: Ambulatory Visit | Attending: Cardiovascular Disease | Admitting: Cardiovascular Disease

## 2012-05-24 ENCOUNTER — Ambulatory Visit (INDEPENDENT_AMBULATORY_CARE_PROVIDER_SITE_OTHER): Payer: BC Managed Care – PPO | Admitting: Cardiovascular Disease

## 2012-05-24 ENCOUNTER — Encounter: Payer: Self-pay | Admitting: Cardiovascular Disease

## 2012-05-24 VITALS — BP 144/72 | HR 80 | Resp 18 | Ht 74.0 in | Wt 245.0 lb

## 2012-05-24 DIAGNOSIS — I10 Essential (primary) hypertension: Secondary | ICD-10-CM

## 2012-05-24 DIAGNOSIS — I2510 Atherosclerotic heart disease of native coronary artery without angina pectoris: Secondary | ICD-10-CM

## 2012-05-24 DIAGNOSIS — E785 Hyperlipidemia, unspecified: Secondary | ICD-10-CM

## 2012-05-24 MED ORDER — LISINOPRIL 10 MG PO TABS: 10.0000 mg | ORAL_TABLET | Freq: Every day | ORAL | Status: DC

## 2012-05-24 NOTE — Patient Instructions (Addendum)
Restart taking lisinopril 10 mg daily  Stop smoking  Your physician recommends that you schedule a follow-up appointment in: 3 months/ bmp/ fasting labs

## 2012-05-24 NOTE — Assessment & Plan Note (Signed)
But his blood pressure is mildly elevated today. He took his lisinopril for only one month because he did not have any refills. We will refill his lisinopril. We'll see him in 3 months for an office visit and basic metabolic profile.

## 2012-05-24 NOTE — Assessment & Plan Note (Signed)
Curtis Perez has had coronary artery bypass grafting. Unfortunately, he continues to smoke. He's had some episodes of interscapular pain. He felt that these might be related to GI. I've asked him to try taking nitroglycerin and also try taking some Mylanta to see which one seems to make the chest pain better. If he has any severe episodes of chest pain or interscapular pain that last for a long time he is to go to the emergency room.  I see him again in 3 months for followup visit.  We'll check fasting lipids at that time.

## 2012-05-24 NOTE — Progress Notes (Signed)
Curtis Perez Date of Birth  January 16, 1951       [facility] Office [address]  [address] [place], [place]  [address]   [place], [place]  [address] [phone]     [phone]   Fax  [phone]    Fax [phone]  Problem List: 1. Coronary artery disease-status post stenting of his left circumflex artery many years ago-status post CABG in April, 2013 2. Hyperlipidemia  History of Present Illness:  [name] with a history of coronary artery disease. He had coronary artery bypass grafting several months ago. He is making progress but today he's not feeling quite as well as he has been.  He's had a couple of this episodes of chest pain. He tried nitroglycerin on one occasion but it did not help. He had chest pain a week later and did not take nitroglycerin because he did not feel like it was related to coronary artery disease.  This pain was described as an intrascapular discomfort.  Current Outpatient Prescriptions on File Prior to Visit  Medication Sig Dispense Refill  . aspirin 325 MG tablet Take 325 mg by mouth daily.      Marland Kitchen atenolol (TENORMIN) 50 MG tablet Take 50 mg by mouth daily.      Jennette Banker Sodium 30-100 MG CAPS Take 100 mg by mouth daily. Stool Softner      . Cyanocobalamin (VITAMIN B-12 PO) Take 1,000 mcg by mouth daily.       . Magnesium 500 MG CAPS Take 500 mg by mouth daily.      . Omega-3 Fatty Acids (FISH OIL PO) Take 2,000 mg by mouth daily.      . pravastatin (PRAVACHOL) 40 MG tablet Take 40 mg by mouth daily.      . Vitamin D, Ergocalciferol, (DRISDOL) 50000 UNITS CAPS Take 50,000 Units by mouth See admin instructions. Taking Four Times per Week. mon wed fri sat      . lisinopril (PRINIVIL,ZESTRIL) 10 MG tablet Take 1 tablet (10 mg total) by mouth daily.  30 tablet  1    No Known Allergies  Past Medical History  Diagnosis Date  . Hypertension   . Hyperlipidemia   .  Bradycardia   . Myocardial infarction     2004  . Tobacco abuse 03/28/2012  . 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 CAD (coronary artery disease)     remote MI in 2004 with PCI; s/p CABG x 3 03/2012    Past Surgical History  Procedure Date  . Tonsillectomy   . Knee surgery   . Back surgery   . Coronary angioplasty with stent placement     about 8 years ago at [facility]  . Colonoscopy   . Coronary artery bypass graft 04/02/2012    Procedure: CORONARY ARTERY BYPASS GRAFTING (CABG);  Surgeon: Purcell Nails, MD;  Location: [facility] OR;  Service: Open Heart Surgery;  Laterality: N/A;  On pump, times three graphs using endoscopically harvested right greater saphenous vein and left internal mammary artery.     History  Smoking status  . Current Everyday Smoker -- 1.0 packs/day for 40 years  . Types: Cigarettes  Smokeless tobacco  . Never Used  Comment: down from 2ppd    History  Alcohol Use No    Rarely  No family history on file.  Reviw of Systems:  Reviewed in the HPI.  All other systems are negative.  Physical Exam: Blood pressure 144/72, pulse 80, resp. rate 18, height 6\' 2"  (1.88 m), weight 245 lb (111.131 kg). General: Well developed, well nourished, in no acute distress.  Head: Normocephalic, atraumatic, sclera non-icteric, mucus membranes are moist,   Neck: Supple. Carotids are 2 + without bruits. No JVD  Lungs: Clear bilaterally to auscultation.  Heart: regular rate.  normal  S1 S2. History incision is healing fairly well. His ostomy sites are well healed.  Abdomen: Soft, non-tender, non-distended with normal bowel sounds. No hepatomegaly. No rebound/guarding. No masses.  Msk:  Strength and tone are normal  Extremities: No clubbing or cyanosis. No edema.  Distal pedal pulses are 2+ and equal bilaterally.  Neuro: Alert and oriented X 3. Moves all extremities spontaneously.  Psych:  Responds to  questions appropriately with a normal affect.  ECG:   Assessment / Plan:

## 2012-05-27 ENCOUNTER — Ambulatory Visit: Payer: BC Managed Care – PPO | Admitting: Nurse Practitioner

## 2012-05-27 ENCOUNTER — Encounter (HOSPITAL_COMMUNITY)
Admission: RE | Admit: 2012-05-27 | Discharge: 2012-05-27 | Disposition: A | Discharge status: Home / Self Care | Payer: BC Managed Care – PPO | Source: Ambulatory Visit | Attending: Cardiovascular Disease | Admitting: Cardiovascular Disease

## 2012-05-29 ENCOUNTER — Encounter (HOSPITAL_COMMUNITY)
Admission: RE | Admit: 2012-05-29 | Discharge: 2012-05-29 | Disposition: A | Discharge status: Home / Self Care | Payer: BC Managed Care – PPO | Source: Ambulatory Visit | Attending: Cardiovascular Disease | Admitting: Cardiovascular Disease

## 2012-05-31 ENCOUNTER — Encounter (HOSPITAL_COMMUNITY)
Admission: RE | Admit: 2012-05-31 | Discharge: 2012-05-31 | Disposition: A | Discharge status: Home / Self Care | Payer: BC Managed Care – PPO | Source: Ambulatory Visit | Attending: Cardiovascular Disease | Admitting: Cardiovascular Disease

## 2012-05-31 NOTE — Progress Notes (Signed)
Reviewed home exercise with pt today.  Pt plans to walk at home For exercise.  Reviewed THR, pulse, RPE, sign and symptoms, NTG use, and when to call 911 or MD.  Pt voiced understanding. Electronically signed by Harriett Sine MS on Friday May 31 2012 at 1354

## 2012-06-03 ENCOUNTER — Encounter (HOSPITAL_COMMUNITY)
Admission: RE | Admit: 2012-06-03 | Discharge: 2012-06-03 | Disposition: A | Discharge status: Home / Self Care | Payer: BC Managed Care – PPO | Source: Ambulatory Visit | Attending: Cardiovascular Disease | Admitting: Cardiovascular Disease

## 2012-06-05 ENCOUNTER — Encounter (HOSPITAL_COMMUNITY)
Admission: RE | Admit: 2012-06-05 | Discharge: 2012-06-05 | Disposition: A | Discharge status: Home / Self Care | Payer: BC Managed Care – PPO | Source: Ambulatory Visit | Attending: Cardiovascular Disease | Admitting: Cardiovascular Disease

## 2012-06-07 ENCOUNTER — Encounter (HOSPITAL_COMMUNITY)
Admission: RE | Admit: 2012-06-07 | Discharge: 2012-06-07 | Disposition: A | Discharge status: Home / Self Care | Payer: BC Managed Care – PPO | Source: Ambulatory Visit | Attending: Cardiovascular Disease | Admitting: Cardiovascular Disease

## 2012-06-10 ENCOUNTER — Encounter (HOSPITAL_COMMUNITY)
Admission: RE | Admit: 2012-06-10 | Discharge: 2012-06-10 | Disposition: A | Discharge status: Home / Self Care | Payer: BC Managed Care – PPO | Source: Ambulatory Visit | Attending: Cardiovascular Disease | Admitting: Cardiovascular Disease

## 2012-06-12 ENCOUNTER — Encounter (HOSPITAL_COMMUNITY)
Admission: RE | Admit: 2012-06-12 | Discharge: 2012-06-12 | Disposition: A | Discharge status: Home / Self Care | Payer: BC Managed Care – PPO | Source: Ambulatory Visit | Attending: Cardiovascular Disease | Admitting: Cardiovascular Disease

## 2012-06-12 IMAGING — CR DG CHEST 1V PORT
1 series · 1 of 1 positions shown · non-contrast
Comparison: 04/02/2012; 03/29/2012; chest CT - 12/14/2003

CLINICAL DATA: Postop open heart surgery

PORTABLE CHEST - 1 VIEW

[AP]
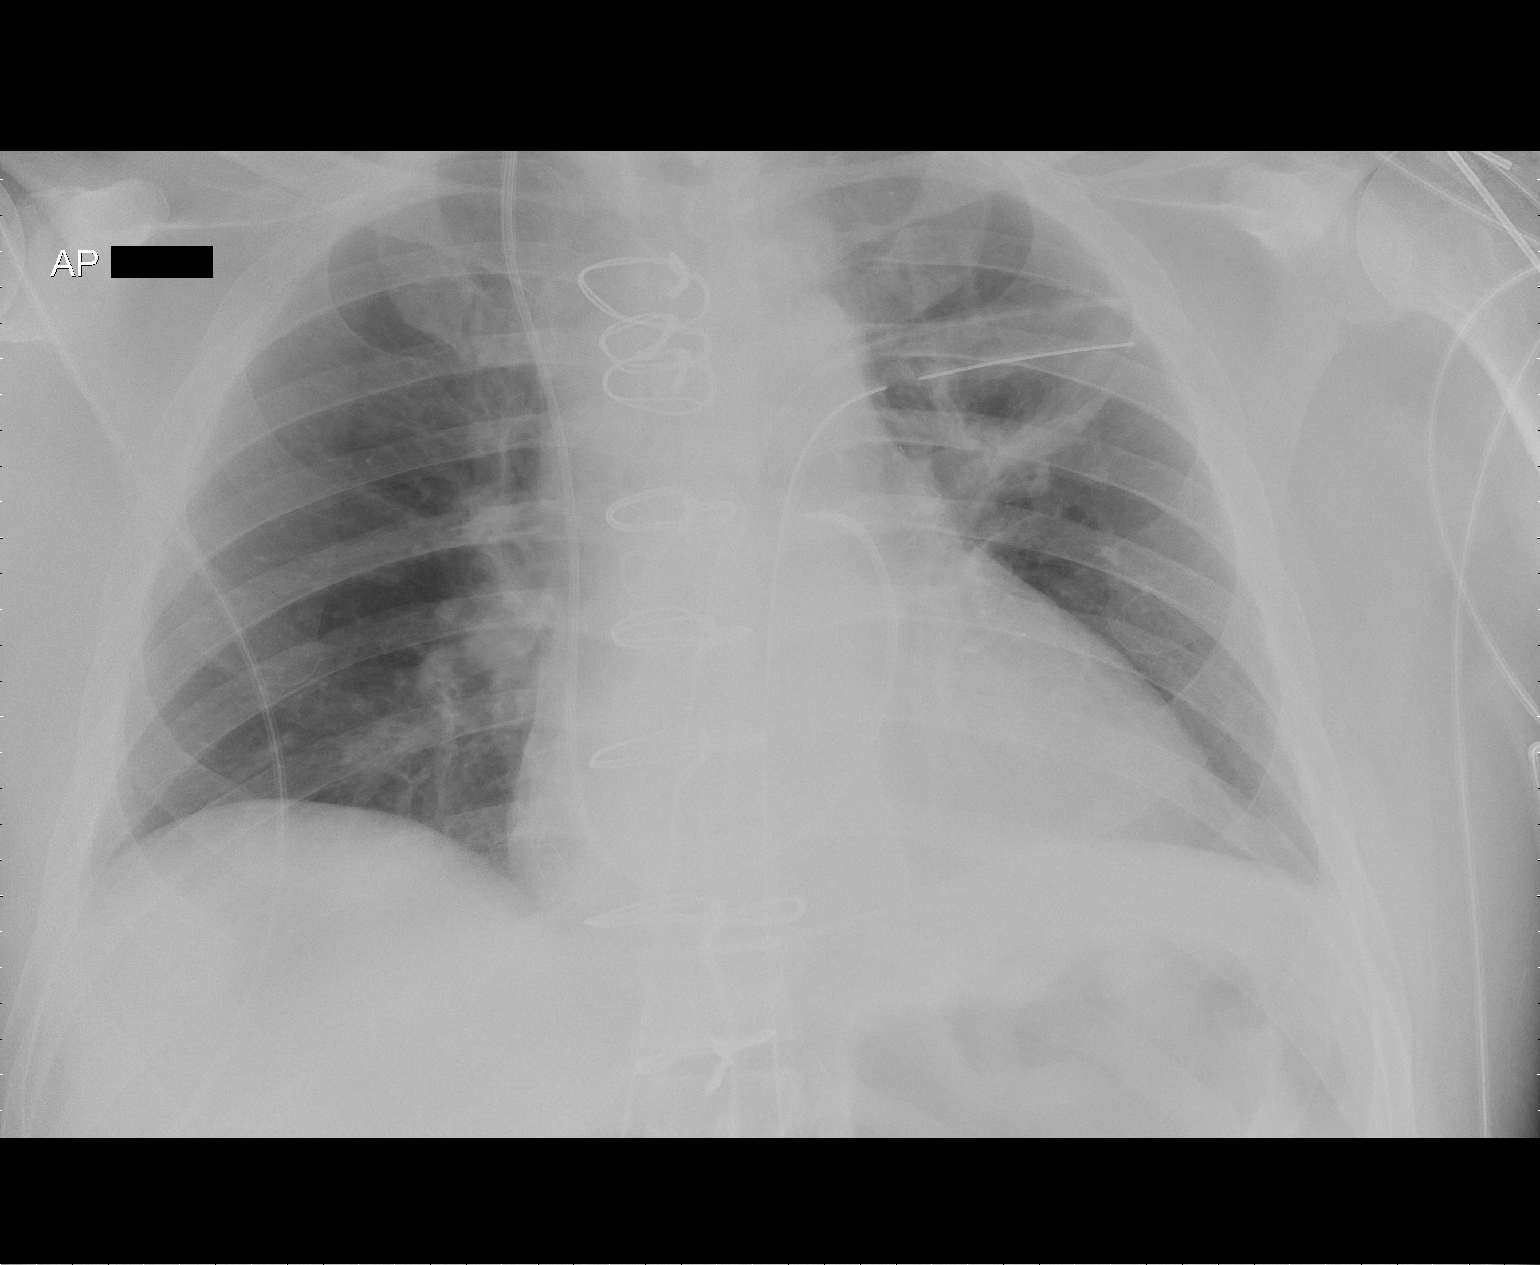

[1 of 1 positions shown; findings below may reference images not displayed]

FINDINGS: Grossly to enlarged cardiac silhouette and mediastinal
contours post median sternotomy and CABG.  Interval extubation and
removal of enteric tube.  Otherwise, stable positioning of
remaining support apparatus.  A small left apical pneumothorax is
suspected.  Grossly unchanged perihilar and left basilar opacities
favored to represent atelectasis.  Query small left-sided effusion.
Grossly unchanged bones.
IMPRESSION: 1.  Interval extubation and removal of enteric tube.  Otherwise,
stable positioning of remaining support apparatus.  Small left-
sided pneumothorax.
2.  Grossly unchanged perihilar and left basilar opacities favored
to represent atelectasis.

This was made a call report.

## 2012-06-12 NOTE — Progress Notes (Signed)
Curtis Perez 61 y.o. male Nutrition Note Spoke with pt. Pt wants to lose wt. Per discussion with pt, he has a history of "losing weight." Pt has previously met with an RD re: wt loss. Weight loss tips reviewed. Pt continues to use tobacco products and is uninterested in quitting tobacco use. Nutrition Plan and cholesterol goals reviewed with pt. Pt is aware that he is prediabetic. Per nutrition screen, pt c/o constipation. Increasing fluid and fiber intake discussed. Pt expressed understanding of information reviewed. Nutrition Diagnosis   Food-and nutrition-related knowledge deficit related to lack of exposure to information as related to diagnosis of: ? CVD ? Pre-DM (A1c 5.8)     Obesity related to excessive energy intake as evidenced by a BMI of 32.7 Nutrition RX/ Estimated Daily Nutrition Needs for: wt loss  1800-2300 Kcal, 50-60 gm fat, 13-18 gm sat fat, 1.8-2.3 gm trans-fat, <1500 mg sodium  Nutrition Intervention   Pt's individual nutrition plan including cholesterol goals reviewed with pt.   Benefits of adopting Therapeutic Lifestyle Changes discussed when Medficts reviewed.   Pt to attend the Portion Distortion class   Pt to attend the  ? Nutrition I class                         ? Nutrition II class    Pt given handouts for: ? wt loss ? pre-DM   Continue client-centered nutrition education by RD, as part of interdisciplinary care. Goal(s)   Pt to identify and limit food sources of saturated fat, trans fat, and cholesterol   Pt to identify food quantities necessary to achieve: ? wt loss to a goal wt of 224-236 lb (101.8-107.3 kg) at graduation from cardiac rehab.  Monitor and Evaluate progress toward nutrition goal with team.

## 2012-06-13 ENCOUNTER — Encounter: Payer: Self-pay | Admitting: Gastroenterology

## 2012-06-14 ENCOUNTER — Encounter (HOSPITAL_COMMUNITY)
Admission: RE | Admit: 2012-06-14 | Discharge: 2012-06-14 | Disposition: A | Discharge status: Home / Self Care | Payer: BC Managed Care – PPO | Source: Ambulatory Visit | Attending: Cardiovascular Disease | Admitting: Cardiovascular Disease

## 2012-06-17 ENCOUNTER — Encounter (HOSPITAL_COMMUNITY)
Admission: RE | Admit: 2012-06-17 | Discharge: 2012-06-17 | Disposition: A | Discharge status: Home / Self Care | Payer: BC Managed Care – PPO | Source: Ambulatory Visit | Attending: Cardiovascular Disease | Admitting: Cardiovascular Disease

## 2012-06-17 DIAGNOSIS — Z9861 Coronary angioplasty status: Secondary | ICD-10-CM | POA: Insufficient documentation

## 2012-06-17 DIAGNOSIS — F17200 Nicotine dependence, unspecified, uncomplicated: Secondary | ICD-10-CM | POA: Insufficient documentation

## 2012-06-17 DIAGNOSIS — Z951 Presence of aortocoronary bypass graft: Secondary | ICD-10-CM | POA: Insufficient documentation

## 2012-06-17 DIAGNOSIS — I200 Unstable angina: Secondary | ICD-10-CM | POA: Insufficient documentation

## 2012-06-17 DIAGNOSIS — E785 Hyperlipidemia, unspecified: Secondary | ICD-10-CM | POA: Insufficient documentation

## 2012-06-17 DIAGNOSIS — Z5189 Encounter for other specified aftercare: Secondary | ICD-10-CM | POA: Insufficient documentation

## 2012-06-17 DIAGNOSIS — I252 Old myocardial infarction: Secondary | ICD-10-CM | POA: Insufficient documentation

## 2012-06-17 DIAGNOSIS — Z7982 Long term (current) use of aspirin: Secondary | ICD-10-CM | POA: Insufficient documentation

## 2012-06-17 DIAGNOSIS — I498 Other specified cardiac arrhythmias: Secondary | ICD-10-CM | POA: Insufficient documentation

## 2012-06-17 DIAGNOSIS — I10 Essential (primary) hypertension: Secondary | ICD-10-CM | POA: Insufficient documentation

## 2012-06-17 DIAGNOSIS — I2510 Atherosclerotic heart disease of native coronary artery without angina pectoris: Secondary | ICD-10-CM | POA: Insufficient documentation

## 2012-06-17 LAB — GLUCOSE, CAPILLARY: Glucose-Capillary: 171 mg/dL — ABNORMAL HIGH (ref 70–99)

## 2012-06-18 ENCOUNTER — Telehealth: Payer: Self-pay | Admitting: Cardiovascular Disease

## 2012-06-18 ENCOUNTER — Ambulatory Visit (INDEPENDENT_AMBULATORY_CARE_PROVIDER_SITE_OTHER): Payer: BC Managed Care – PPO | Admitting: Gastroenterology

## 2012-06-18 ENCOUNTER — Encounter: Payer: Self-pay | Admitting: Gastroenterology

## 2012-06-18 VITALS — BP 139/62 | HR 60 | Ht 74.0 in | Wt 251.0 lb

## 2012-06-18 DIAGNOSIS — Z951 Presence of aortocoronary bypass graft: Secondary | ICD-10-CM

## 2012-06-18 DIAGNOSIS — R0789 Other chest pain: Secondary | ICD-10-CM

## 2012-06-18 DIAGNOSIS — Z860100 Personal history of colon polyps, unspecified: Secondary | ICD-10-CM

## 2012-06-18 DIAGNOSIS — K644 Residual hemorrhoidal skin tags: Secondary | ICD-10-CM

## 2012-06-18 DIAGNOSIS — K219 Gastro-esophageal reflux disease without esophagitis: Secondary | ICD-10-CM

## 2012-06-18 DIAGNOSIS — K625 Hemorrhage of anus and rectum: Secondary | ICD-10-CM

## 2012-06-18 MED ORDER — ESOMEPRAZOLE MAGNESIUM 40 MG PO CPDR: 40.0000 mg | DELAYED_RELEASE_CAPSULE | Freq: Every day | ORAL | Status: DC

## 2012-06-18 MED ORDER — PEG-KCL-NACL-NASULF-NA ASC-C 100 G PO SOLR: 1.0000 | Freq: Once | ORAL | Status: DC

## 2012-06-18 NOTE — Telephone Encounter (Signed)
Spoke with pt, will let dr Elease Hashimoto know, he is out this week.

## 2012-06-18 NOTE — Patient Instructions (Signed)

## 2012-06-18 NOTE — Progress Notes (Signed)
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 syndrome, who had removal of multiple colon polyps in March of 2010. He currently has large external hemorrhoids with recurrent rectal bleeding, also episodic atypical chest pain manifested by mid back pain which occurs spontaneously and last up to an hour in duration without hepatobiliary or typical reflux symptoms. He has been on over-the-counter Prilosec 20 mg a day with good resolution of these" attacks". He denies dysphagia, and apparently has been told previously that he had a" hiatal hernia". In April of this year he underwent coronary artery bypass surgery by Dr. Horald Chestnut, and is currently asymptomatic and not on anticoagulant. The patient continues with mild constipation but denies abdominal pain, anorexia, weight loss, or any systemic complaints. He returns to work in several weeks' time, and once his colonoscopy and endoscopy performed before that time. He has had followup cardiology consultation with Dr. Melburn Popper, is doing well from a cardiovascular standpoint.  Current Medications, Allergies, Past Medical History, Past Surgical History, Family History and Social History were reviewed in [employer] record.  Pertinent Review of Systems Negative   Physical Exam: He is a tall large patient in no acute distress. Blood pressure 139/62, pulse 60 and regular, weight 251 pounds with a BMI of 32.23. I cannot appreciate stigmata of chronic liver disease. His chest is clear, and he is in a regular rhythm without murmurs gallops or rubs. There is no organomegaly, abdominal masses or tenderness. Inspection the rectum shows external skin tags and external hemorrhoids. Peripheral extremities showed no edema, phlebitis, or swollen joints. Mental status is normal.    Assessment and Plan: This patient is overdue for followup colonoscopy because of his suspected genetic polyposis syndrome. Is  not on anticoagulants at this time, is several months status post bypass surgery, and I have scheduled this at his convenience with cardiology approval. I also think he is having acid reflux and esophageal spasm and have placed him on Nexium 40 mg every morning with an anti-reflux regime. He does have a mild anemia with hemoglobin 11.5, otherwise labs are normal. No diagnosis found.

## 2012-06-18 NOTE — Telephone Encounter (Signed)
New msg Pt is having colonoscopy on [id number]. He wanted to let Dr Elease Hashimoto know

## 2012-06-19 ENCOUNTER — Encounter (HOSPITAL_COMMUNITY)
Admission: RE | Admit: 2012-06-19 | Discharge: 2012-06-19 | Disposition: A | Discharge status: Home / Self Care | Payer: BC Managed Care – PPO | Source: Ambulatory Visit | Attending: Cardiovascular Disease | Admitting: Cardiovascular Disease

## 2012-06-21 ENCOUNTER — Encounter (HOSPITAL_COMMUNITY)
Admission: RE | Admit: 2012-06-21 | Discharge: 2012-06-21 | Disposition: A | Discharge status: Home / Self Care | Payer: BC Managed Care – PPO | Source: Ambulatory Visit | Attending: Cardiovascular Disease | Admitting: Cardiovascular Disease

## 2012-06-21 LAB — GLUCOSE, CAPILLARY: Glucose-Capillary: 126 mg/dL — ABNORMAL HIGH (ref 70–99)

## 2012-06-24 ENCOUNTER — Encounter (HOSPITAL_COMMUNITY)
Admission: RE | Admit: 2012-06-24 | Discharge: 2012-06-24 | Disposition: A | Discharge status: Home / Self Care | Payer: BC Managed Care – PPO | Source: Ambulatory Visit | Attending: Cardiovascular Disease | Admitting: Cardiovascular Disease

## 2012-06-25 ENCOUNTER — Telehealth: Payer: Self-pay | Admitting: Cardiovascular Disease

## 2012-06-25 NOTE — Telephone Encounter (Signed)
Reviewed Ov with Dr Jarold Motto, pt to have colonoscopy, pt wants to know if ok to have colonoscopy? Please advise.

## 2012-06-25 NOTE — Telephone Encounter (Signed)
Pt is having a colonoscopy on Friday and wants to make sure you are updated and he has all he needs

## 2012-06-25 NOTE — Telephone Encounter (Signed)
Pt called, no concerns per Dr Elease Hashimoto to have colonoscopy.

## 2012-06-26 ENCOUNTER — Encounter (HOSPITAL_COMMUNITY)
Admission: RE | Admit: 2012-06-26 | Discharge: 2012-06-26 | Disposition: A | Discharge status: Home / Self Care | Payer: BC Managed Care – PPO | Source: Ambulatory Visit | Attending: Cardiovascular Disease | Admitting: Cardiovascular Disease

## 2012-06-28 ENCOUNTER — Encounter (HOSPITAL_COMMUNITY): Payer: BC Managed Care – PPO

## 2012-06-28 ENCOUNTER — Encounter: Payer: Self-pay | Admitting: Gastroenterology

## 2012-06-28 ENCOUNTER — Ambulatory Visit (AMBULATORY_SURGERY_CENTER): Payer: BC Managed Care – PPO | Admitting: Gastroenterology

## 2012-06-28 VITALS — BP 134/73 | HR 55 | Temp 98.1°F | Resp 24 | Ht 74.0 in | Wt 251.0 lb

## 2012-06-28 DIAGNOSIS — K219 Gastro-esophageal reflux disease without esophagitis: Secondary | ICD-10-CM

## 2012-06-28 DIAGNOSIS — R079 Chest pain, unspecified: Secondary | ICD-10-CM

## 2012-06-28 DIAGNOSIS — Z86010 Personal history of colonic polyps: Secondary | ICD-10-CM

## 2012-06-28 LAB — HM COLONOSCOPY: HM Colonoscopy: NORMAL

## 2012-06-28 MED ORDER — SODIUM CHLORIDE 0.9 % IV SOLN: 500.0000 mL | INTRAVENOUS | Status: DC

## 2012-06-28 NOTE — Progress Notes (Signed)
Propofol per j nulty crna. See scanned intra procedure report. ewm 

## 2012-06-28 NOTE — Op Note (Signed)
[facility] [address] [place], [place]  [address]  ENDOSCOPY PROCEDURE REPORT  PATIENT:  Curtis Perez, Curtis Perez  MR#:  [id number] BIRTHDATE:  1951/05/04, 60 yrs. old  GENDER:  male  ENDOSCOPIST:  Vania Rea. Jarold Motto, MD, [facility] Referred by:  PROCEDURE DATE:  06/28/2012 PROCEDURE:  EGD with biopsy for H. pylori 19147 ASA CLASS:  Class III INDICATIONS:  chest pain  MEDICATIONS:   There was residual sedation effect present from prior procedure., propofol (Diprivan) 120 mg IV, glycopyrrolate (Robinal) 0.2 mg IV TOPICAL ANESTHETIC:  Exactacain Spray  DESCRIPTION OF PROCEDURE:   After the risks and benefits of the procedure were explained, informed consent was obtained.  The LB [id number]-[id number] endoscope was introduced through the mouth and advanced to the second portion of the duodenum.  The instrument was slowly withdrawn as the mucosa was fully examined. <<PROCEDUREIMAGES>>  The upper, middle, and distal third of the esophagus were carefully inspected and no abnormalities were noted. The z-line was well seen at the GEJ. The endoscope was pushed into the fundus which was normal including a retroflexed view. The antrum,gastric body, first and second part of the duodenum were unremarkable. CLO BX. DONE.NO HH OR ESOPHAGITIS NOTED.    Retroflexed views revealed no abnormalities.    The scope was then withdrawn from the patient and the procedure completed.  COMPLICATIONS:  None  ENDOSCOPIC IMPRESSION: 1) Normal EGD PROBABLE TREATED GERD. RECOMMENDATIONS: 1) Await biopsy results 2) continue PPI  ______________________________ Vania Rea. Jarold Motto, MD, Clementeen Graham  CC:  Lucky Cowboy, MD  n. Rosalie Doctor:   Vania Rea. Kemontae Dunklee at 06/28/2012 03:50 PM  Lucilla Lame, [phone]

## 2012-06-28 NOTE — Progress Notes (Signed)
Patient did not experience any of the following events: a burn prior to discharge; a fall within the facility; wrong site/side/patient/procedure/implant event; or a hospital transfer or hospital admission upon discharge from the facility. (G8907) Patient did not have preoperative order for IV antibiotic SSI prophylaxis. (G8918)  

## 2012-06-28 NOTE — Op Note (Signed)
[facility] [address] [place], [place]  [address]  COLONOSCOPY PROCEDURE REPORT  PATIENT:  Curtis, Perez  MR#:  [id number] BIRTHDATE:  August 11, 1951, 60 yrs. old  GENDER:  male ENDOSCOPIST:  Vania Rea. Jarold Motto, MD, [facility] REF. BY: PROCEDURE DATE:  06/28/2012 PROCEDURE:  Higher-risk screening colonoscopy G0105  ASA CLASS:  Class III INDICATIONS:  history of pre-cancerous (adenomatous) colon polyps PRIOR REMOVAL OF MULTIPLE ADENOMAS IN MARCH 2010 MEDICATIONS:   propofol (Diprivan) 180 mg IV  DESCRIPTION OF PROCEDURE:   After the risks and benefits and of the procedure were explained, informed consent was obtained. Digital rectal exam was performed and revealed no abnormalities. The LB CF-[id number] endoscope was introduced through the anus and advanced to the cecum, which was identified by both the appendix and ileocecal valve.  The quality of the prep was excellent, using MoviPrep.  The instrument was then slowly withdrawn as the colon was fully examined. <<PROCEDUREIMAGES>>  FINDINGS:  Scattered diverticula were found in the right colon. No polyps or cancers were seen.  This was otherwise a normal examination of the colon.   Retroflexed views in the rectum revealed no abnormalities.    The scope was then withdrawn from the patient and the procedure completed.  COMPLICATIONS:  None ENDOSCOPIC IMPRESSION: 1) Diverticula, scattered in the right colon 2) No polyps or cancers 3) Otherwise normal examination RECOMMENDATIONS: 1) Repeat Colonoscopy in 3 years.  REPEAT EXAM:  No  ______________________________ Vania Rea. Jarold Motto, MD, [facility] Graham  CC:  Lucky Cowboy, MD  n. Rosalie Doctor:   Vania Rea. Shawndell Schillaci at 06/28/2012 03:44 PM  Lucilla Lame, [phone]

## 2012-06-28 NOTE — Patient Instructions (Addendum)
DISCHARGE INSTRUCTIONS GIVEN WITH VERBAL UNDERSTANDING. HANDOUTS ON DIVERTICULOSIS AND A HIGH FIBER DIET GIVEN. RESUME PREVIOUS MEDICATIONS. YOU HAD AN ENDOSCOPIC PROCEDURE TODAY AT THE [facility]: Refer to the procedure report that was given to you for any specific questions about what was found during the examination.  If the procedure report does not answer your questions, please call your gastroenterologist to clarify.  If you requested that your care partner not be given the details of your procedure findings, then the procedure report has been included in a sealed envelope for you to review at your convenience later.  YOU SHOULD EXPECT: Some feelings of bloating in the abdomen. Passage of more gas than usual.  Walking can help get rid of the air that was put into your GI tract during the procedure and reduce the bloating. If you had a lower endoscopy (such as a colonoscopy or flexible sigmoidoscopy) you may notice spotting of blood in your stool or on the toilet paper. If you underwent a bowel prep for your procedure, then you may not have a normal bowel movement for a few days.  DIET: Your first meal following the procedure should be a light meal and then it is ok to progress to your normal diet.  A half-sandwich or bowl of soup is an example of a good first meal.  Heavy or fried foods are harder to digest and may make you feel nauseous or bloated.  Likewise meals heavy in dairy and vegetables can cause extra gas to form and this can also increase the bloating.  Drink plenty of fluids but you should avoid alcoholic beverages for 24 hours.  ACTIVITY: Your care partner should take you home directly after the procedure.  You should plan to take it easy, moving slowly for the rest of the day.  You can resume normal activity the day after the procedure however you should NOT DRIVE or use heavy machinery for 24 hours (because of the sedation medicines used during the test).    SYMPTOMS TO  REPORT IMMEDIATELY: A gastroenterologist can be reached at any hour.  During normal business hours, 8:30 AM to 5:00 PM Monday through Friday, call [phone].  After hours and on weekends, please call the GI answering service at [phone] who will take a message and have the physician on call contact you.   Following lower endoscopy (colonoscopy or flexible sigmoidoscopy):  Excessive amounts of blood in the stool  Significant tenderness or worsening of abdominal pains  Swelling of the abdomen that is new, acute  Fever of 100F or higher  Following upper endoscopy (EGD)  Vomiting of blood or coffee ground material  New chest pain or pain under the shoulder blades  Painful or persistently difficult swallowing  New shortness of breath  Fever of 100F or higher  Black, tarry-looking stools  FOLLOW UP: If any biopsies were taken you will be contacted by phone or by letter within the next 1-3 weeks.  Call your gastroenterologist if you have not heard about the biopsies in 3 weeks.  Our staff will call the home number listed on your records the next business day following your procedure to check on you and address any questions or concerns that you may have at that time regarding the information given to you following your procedure. This is a courtesy call and so if there is no answer at the home number and we have not heard from you through the emergency physician on call, we will  assume that you have returned to your regular daily activities without incident.  SIGNATURES/CONFIDENTIALITY: You and/or your care partner have signed paperwork which will be entered into your electronic medical record.  These signatures attest to the fact that that the information above on your After Visit Summary has been reviewed and is understood.  Full responsibility of the confidentiality of this discharge information lies with you and/or your care-partner.

## 2012-07-01 ENCOUNTER — Telehealth: Payer: Self-pay | Admitting: *Deleted

## 2012-07-01 ENCOUNTER — Encounter: Payer: Self-pay | Admitting: Gastroenterology

## 2012-07-01 ENCOUNTER — Encounter (HOSPITAL_COMMUNITY)
Admission: RE | Admit: 2012-07-01 | Discharge: 2012-07-01 | Disposition: A | Discharge status: Home / Self Care | Payer: BC Managed Care – PPO | Source: Ambulatory Visit | Attending: Cardiovascular Disease | Admitting: Cardiovascular Disease

## 2012-07-01 LAB — HELICOBACTER PYLORI SCREEN-BIOPSY: UREASE: NEGATIVE

## 2012-07-01 NOTE — Telephone Encounter (Signed)
  Follow up Call-  Call back number 06/28/2012  Post procedure Call Back phone  # [phone]  Permission to leave phone message Yes     No answer at # given.  Left message on voicemail.

## 2012-07-03 ENCOUNTER — Encounter (HOSPITAL_COMMUNITY)
Admission: RE | Admit: 2012-07-03 | Discharge: 2012-07-03 | Disposition: A | Discharge status: Home / Self Care | Payer: BC Managed Care – PPO | Source: Ambulatory Visit | Attending: Cardiovascular Disease | Admitting: Cardiovascular Disease

## 2012-07-03 NOTE — Progress Notes (Signed)
Pt returned to work last night for PM shift.  Pt trying the 8:15 class time out for exercise.  Pt will alert the rehab staff if this time would work best for him.

## 2012-07-05 ENCOUNTER — Encounter (HOSPITAL_COMMUNITY)
Admission: RE | Admit: 2012-07-05 | Discharge: 2012-07-05 | Disposition: A | Discharge status: Home / Self Care | Payer: BC Managed Care – PPO | Source: Ambulatory Visit | Attending: Cardiovascular Disease | Admitting: Cardiovascular Disease

## 2012-07-08 ENCOUNTER — Encounter (HOSPITAL_COMMUNITY)
Admission: RE | Admit: 2012-07-08 | Discharge: 2012-07-08 | Disposition: A | Discharge status: Home / Self Care | Payer: BC Managed Care – PPO | Source: Ambulatory Visit | Attending: Cardiovascular Disease | Admitting: Cardiovascular Disease

## 2012-07-08 IMAGING — CR DG CHEST 2V
2 series · 2 of 2 positions shown · non-contrast
Comparison: Chest x-ray of 04/04/2012

CLINICAL DATA: Heart surgery on 04/02/2012, follow-up, smoking
history

CHEST - 2 VIEW

[w chest pa]
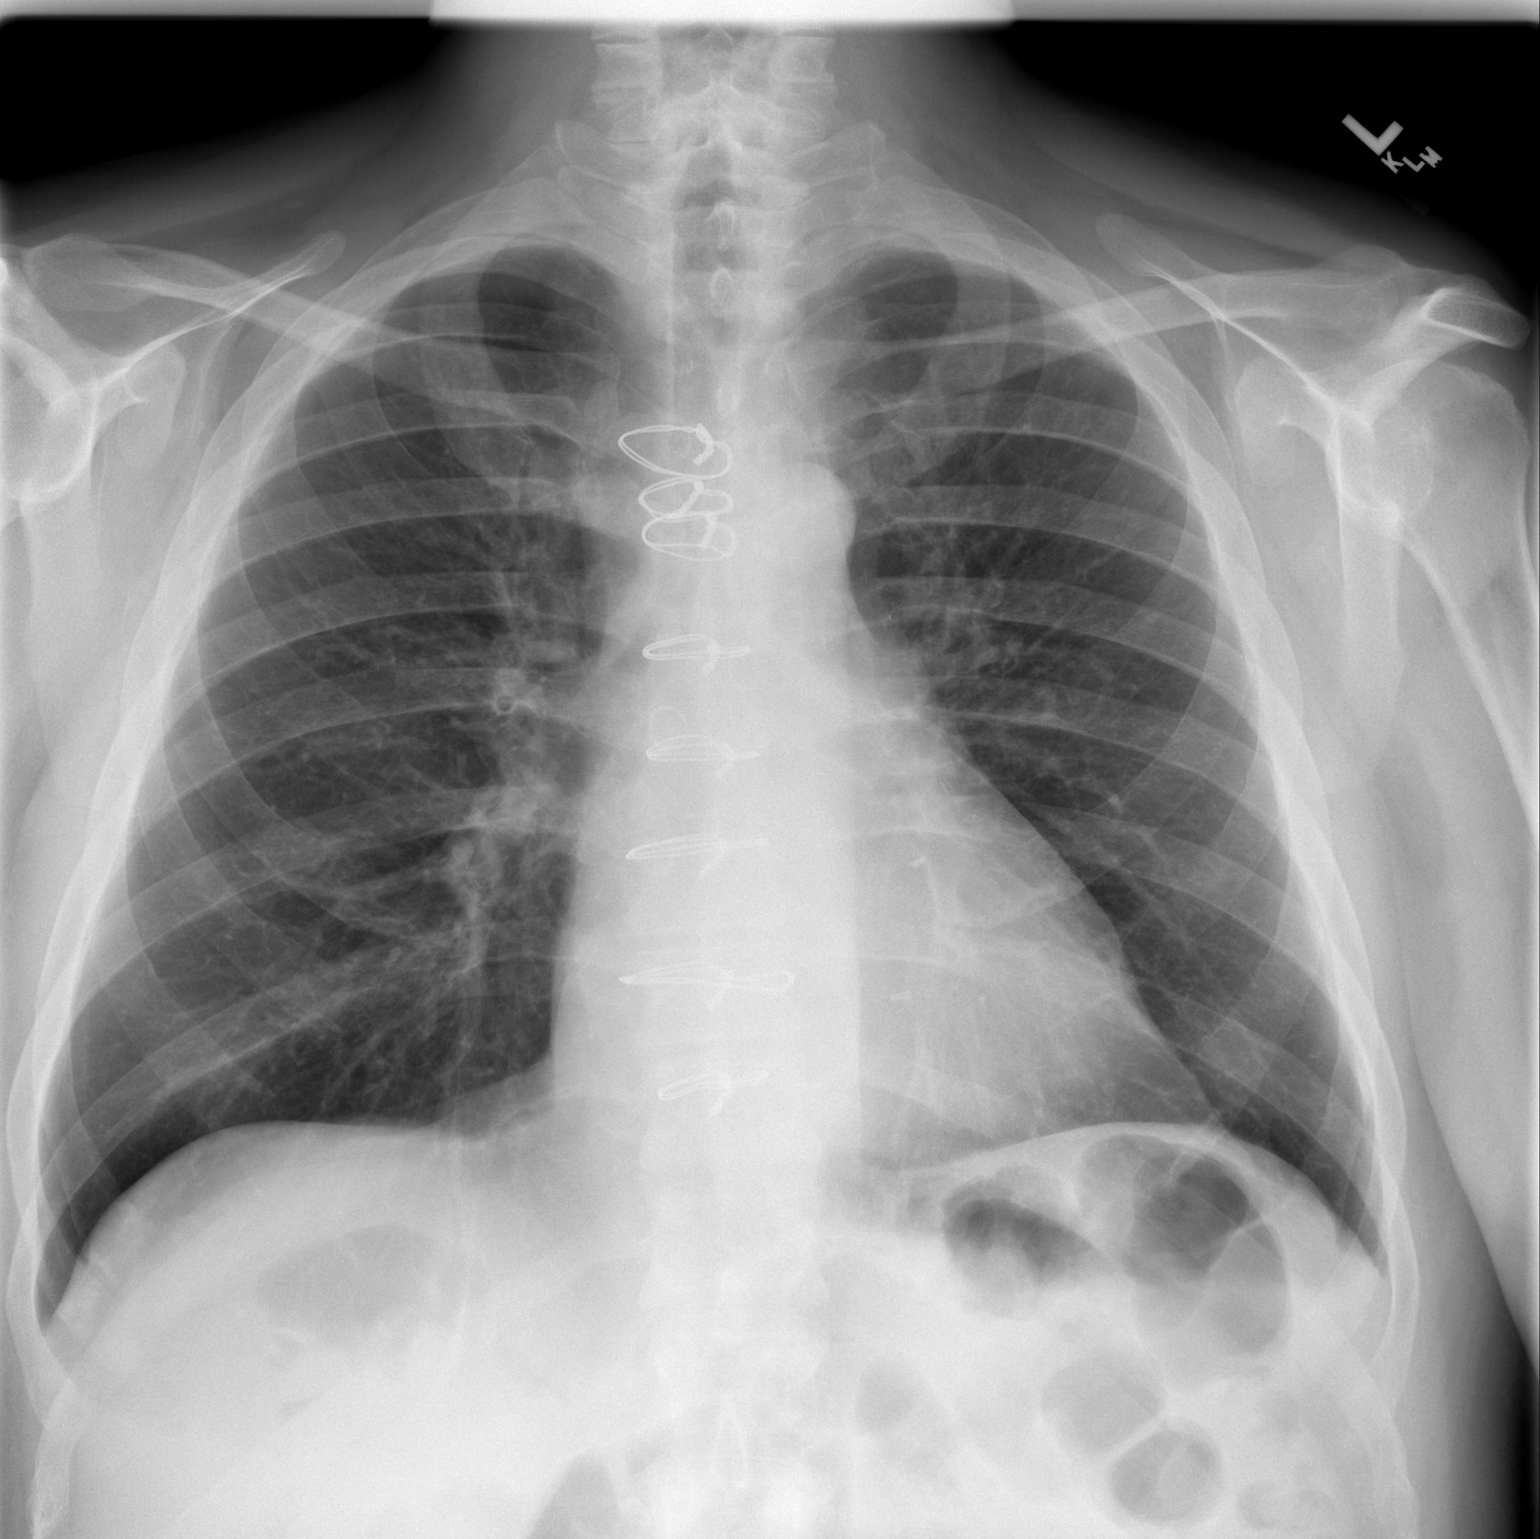

[w chest lat]
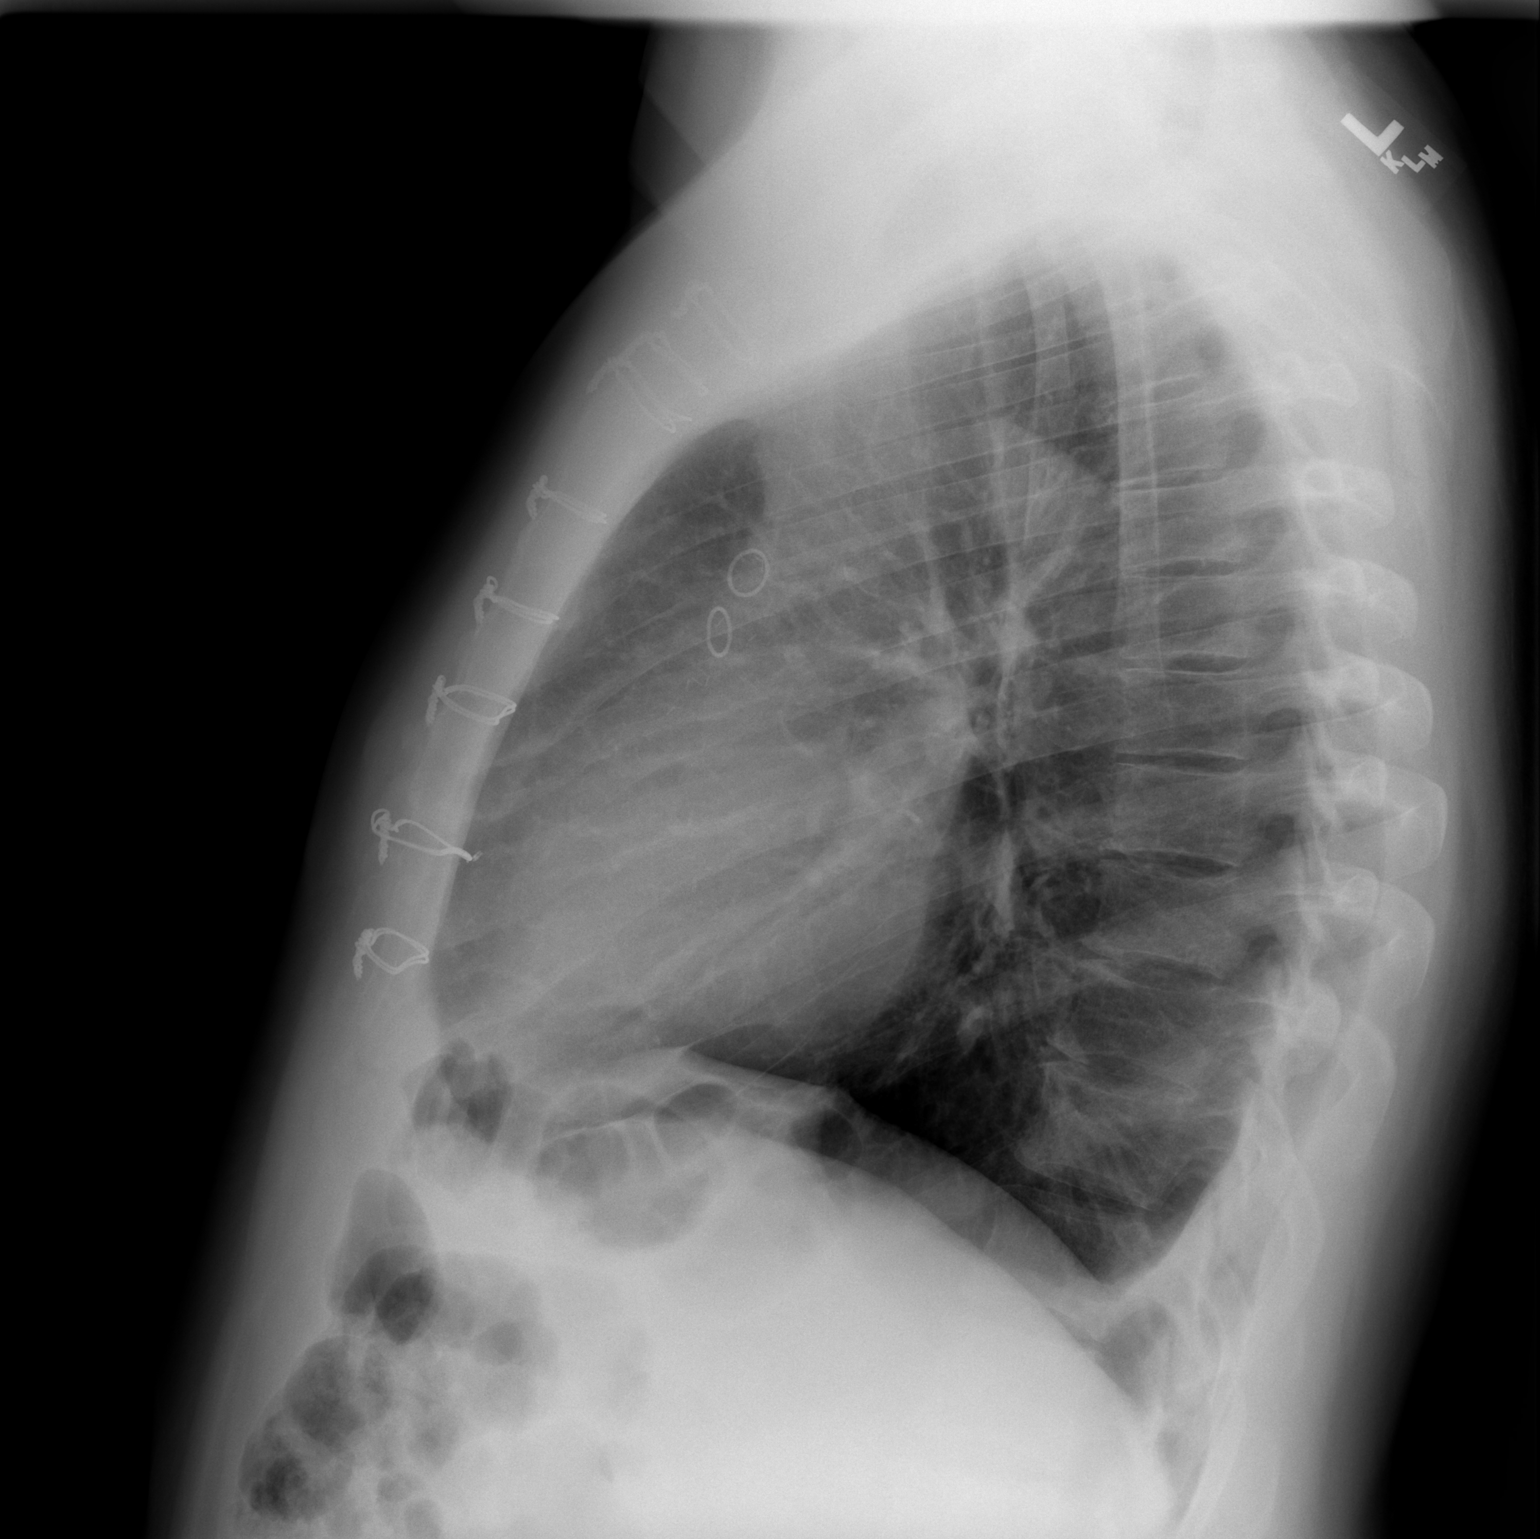

[2 of 2 positions shown; findings below may reference images not displayed]

FINDINGS: Aeration of the lungs has improved.  The linear
atelectasis in the left mid lung has resolved. Only a small left
pleural effusion remains.  Mild cardiomegaly is stable.  Median
sternotomy sutures are noted.
IMPRESSION: Improved aeration.  Only a small left effusion remains.

## 2012-07-10 ENCOUNTER — Encounter (HOSPITAL_COMMUNITY): Payer: BC Managed Care – PPO

## 2012-07-12 ENCOUNTER — Encounter (HOSPITAL_COMMUNITY)
Admission: RE | Admit: 2012-07-12 | Discharge: 2012-07-12 | Disposition: A | Discharge status: Home / Self Care | Payer: BC Managed Care – PPO | Source: Ambulatory Visit | Attending: Cardiovascular Disease | Admitting: Cardiovascular Disease

## 2012-07-15 ENCOUNTER — Encounter (HOSPITAL_COMMUNITY)
Admission: RE | Admit: 2012-07-15 | Discharge: 2012-07-15 | Disposition: A | Discharge status: Home / Self Care | Payer: BC Managed Care – PPO | Source: Ambulatory Visit | Attending: Cardiovascular Disease | Admitting: Cardiovascular Disease

## 2012-07-17 ENCOUNTER — Encounter (HOSPITAL_COMMUNITY)
Admission: RE | Admit: 2012-07-17 | Discharge: 2012-07-17 | Disposition: A | Discharge status: Home / Self Care | Payer: BC Managed Care – PPO | Source: Ambulatory Visit | Attending: Cardiovascular Disease | Admitting: Cardiovascular Disease

## 2012-07-19 ENCOUNTER — Encounter (HOSPITAL_COMMUNITY)
Admission: RE | Admit: 2012-07-19 | Discharge: 2012-07-19 | Disposition: A | Discharge status: Home / Self Care | Payer: BC Managed Care – PPO | Source: Ambulatory Visit | Attending: Cardiovascular Disease | Admitting: Cardiovascular Disease

## 2012-07-19 DIAGNOSIS — Z7982 Long term (current) use of aspirin: Secondary | ICD-10-CM | POA: Insufficient documentation

## 2012-07-19 DIAGNOSIS — I2510 Atherosclerotic heart disease of native coronary artery without angina pectoris: Secondary | ICD-10-CM | POA: Insufficient documentation

## 2012-07-19 DIAGNOSIS — E785 Hyperlipidemia, unspecified: Secondary | ICD-10-CM | POA: Insufficient documentation

## 2012-07-19 DIAGNOSIS — I252 Old myocardial infarction: Secondary | ICD-10-CM | POA: Insufficient documentation

## 2012-07-19 DIAGNOSIS — F17200 Nicotine dependence, unspecified, uncomplicated: Secondary | ICD-10-CM | POA: Insufficient documentation

## 2012-07-19 DIAGNOSIS — I200 Unstable angina: Secondary | ICD-10-CM | POA: Insufficient documentation

## 2012-07-19 DIAGNOSIS — I10 Essential (primary) hypertension: Secondary | ICD-10-CM | POA: Insufficient documentation

## 2012-07-19 DIAGNOSIS — Z5189 Encounter for other specified aftercare: Secondary | ICD-10-CM | POA: Insufficient documentation

## 2012-07-19 DIAGNOSIS — Z951 Presence of aortocoronary bypass graft: Secondary | ICD-10-CM | POA: Insufficient documentation

## 2012-07-19 DIAGNOSIS — I498 Other specified cardiac arrhythmias: Secondary | ICD-10-CM | POA: Insufficient documentation

## 2012-07-19 DIAGNOSIS — Z9861 Coronary angioplasty status: Secondary | ICD-10-CM | POA: Insufficient documentation

## 2012-07-19 NOTE — Progress Notes (Signed)
Curtis Perez's blood pressure was noted at 172/70 on the rower today at cardiac rehab. Today was Curtis Perez's second day on this equipment Curtis Perez increased his level without talking with staff.  Subsequent blood pressures improved.  Exit blood pressure 120/72. Will fax exercise flow sheets to Dr. Elease Hashimoto office for review.  Curtis Perez left cardiac rehab without complaints.

## 2012-07-22 ENCOUNTER — Encounter (HOSPITAL_COMMUNITY)
Admission: RE | Admit: 2012-07-22 | Discharge: 2012-07-22 | Disposition: A | Discharge status: Home / Self Care | Payer: BC Managed Care – PPO | Source: Ambulatory Visit | Attending: Cardiovascular Disease | Admitting: Cardiovascular Disease

## 2012-07-23 ENCOUNTER — Telehealth: Payer: Self-pay | Admitting: Gastroenterology

## 2012-07-24 ENCOUNTER — Encounter (HOSPITAL_COMMUNITY)
Admission: RE | Admit: 2012-07-24 | Discharge: 2012-07-24 | Disposition: A | Discharge status: Home / Self Care | Payer: BC Managed Care – PPO | Source: Ambulatory Visit | Attending: Cardiovascular Disease | Admitting: Cardiovascular Disease

## 2012-07-26 ENCOUNTER — Encounter (HOSPITAL_COMMUNITY): Payer: BC Managed Care – PPO

## 2012-07-29 ENCOUNTER — Encounter (HOSPITAL_COMMUNITY)
Admission: RE | Admit: 2012-07-29 | Discharge: 2012-07-29 | Disposition: A | Discharge status: Home / Self Care | Payer: BC Managed Care – PPO | Source: Ambulatory Visit | Attending: Cardiovascular Disease | Admitting: Cardiovascular Disease

## 2012-07-31 ENCOUNTER — Encounter (HOSPITAL_COMMUNITY)
Admission: RE | Admit: 2012-07-31 | Discharge: 2012-07-31 | Disposition: A | Discharge status: Home / Self Care | Payer: BC Managed Care – PPO | Source: Ambulatory Visit | Attending: Cardiovascular Disease | Admitting: Cardiovascular Disease

## 2012-08-02 ENCOUNTER — Encounter (HOSPITAL_COMMUNITY)
Admission: RE | Admit: 2012-08-02 | Discharge: 2012-08-02 | Disposition: A | Discharge status: Home / Self Care | Payer: BC Managed Care – PPO | Source: Ambulatory Visit | Attending: Cardiovascular Disease | Admitting: Cardiovascular Disease

## 2012-08-05 ENCOUNTER — Encounter (HOSPITAL_COMMUNITY)
Admission: RE | Admit: 2012-08-05 | Discharge: 2012-08-05 | Disposition: A | Discharge status: Home / Self Care | Payer: BC Managed Care – PPO | Source: Ambulatory Visit | Attending: Cardiovascular Disease | Admitting: Cardiovascular Disease

## 2012-08-05 ENCOUNTER — Encounter (HOSPITAL_COMMUNITY): Payer: Self-pay

## 2012-08-07 ENCOUNTER — Encounter (HOSPITAL_COMMUNITY)
Admission: RE | Admit: 2012-08-07 | Discharge: 2012-08-07 | Disposition: A | Discharge status: Home / Self Care | Payer: BC Managed Care – PPO | Source: Ambulatory Visit | Attending: Cardiovascular Disease | Admitting: Cardiovascular Disease

## 2012-08-09 ENCOUNTER — Encounter (HOSPITAL_COMMUNITY): Payer: Self-pay

## 2012-08-09 ENCOUNTER — Encounter (HOSPITAL_COMMUNITY): Payer: BC Managed Care – PPO

## 2012-08-12 ENCOUNTER — Encounter (HOSPITAL_COMMUNITY)
Admission: RE | Admit: 2012-08-12 | Discharge: 2012-08-12 | Disposition: A | Discharge status: Home / Self Care | Payer: Self-pay | Source: Ambulatory Visit | Attending: Cardiovascular Disease | Admitting: Cardiovascular Disease

## 2012-08-12 DIAGNOSIS — I200 Unstable angina: Secondary | ICD-10-CM | POA: Insufficient documentation

## 2012-08-12 DIAGNOSIS — F17200 Nicotine dependence, unspecified, uncomplicated: Secondary | ICD-10-CM | POA: Insufficient documentation

## 2012-08-12 DIAGNOSIS — Z5189 Encounter for other specified aftercare: Secondary | ICD-10-CM | POA: Insufficient documentation

## 2012-08-12 DIAGNOSIS — Z7982 Long term (current) use of aspirin: Secondary | ICD-10-CM | POA: Insufficient documentation

## 2012-08-12 DIAGNOSIS — Z9861 Coronary angioplasty status: Secondary | ICD-10-CM | POA: Insufficient documentation

## 2012-08-12 DIAGNOSIS — I2510 Atherosclerotic heart disease of native coronary artery without angina pectoris: Secondary | ICD-10-CM | POA: Insufficient documentation

## 2012-08-12 DIAGNOSIS — E785 Hyperlipidemia, unspecified: Secondary | ICD-10-CM | POA: Insufficient documentation

## 2012-08-12 DIAGNOSIS — I10 Essential (primary) hypertension: Secondary | ICD-10-CM | POA: Insufficient documentation

## 2012-08-12 DIAGNOSIS — Z951 Presence of aortocoronary bypass graft: Secondary | ICD-10-CM | POA: Insufficient documentation

## 2012-08-12 DIAGNOSIS — I252 Old myocardial infarction: Secondary | ICD-10-CM | POA: Insufficient documentation

## 2012-08-12 DIAGNOSIS — I498 Other specified cardiac arrhythmias: Secondary | ICD-10-CM | POA: Insufficient documentation

## 2012-08-14 ENCOUNTER — Encounter (HOSPITAL_COMMUNITY)
Admission: RE | Admit: 2012-08-14 | Discharge: 2012-08-14 | Disposition: A | Discharge status: Home / Self Care | Payer: Self-pay | Source: Ambulatory Visit | Attending: Cardiovascular Disease | Admitting: Cardiovascular Disease

## 2012-08-16 ENCOUNTER — Encounter (HOSPITAL_COMMUNITY)
Admission: RE | Admit: 2012-08-16 | Discharge: 2012-08-16 | Disposition: A | Discharge status: Home / Self Care | Payer: Self-pay | Source: Ambulatory Visit | Attending: Cardiovascular Disease | Admitting: Cardiovascular Disease

## 2012-08-20 ENCOUNTER — Other Ambulatory Visit (INDEPENDENT_AMBULATORY_CARE_PROVIDER_SITE_OTHER): Payer: BC Managed Care – PPO

## 2012-08-20 ENCOUNTER — Ambulatory Visit (INDEPENDENT_AMBULATORY_CARE_PROVIDER_SITE_OTHER): Payer: BC Managed Care – PPO | Admitting: Cardiovascular Disease

## 2012-08-20 ENCOUNTER — Encounter: Payer: Self-pay | Admitting: Cardiovascular Disease

## 2012-08-20 VITALS — BP 144/74 | HR 65 | Ht 74.0 in | Wt 254.4 lb

## 2012-08-20 DIAGNOSIS — I10 Essential (primary) hypertension: Secondary | ICD-10-CM

## 2012-08-20 DIAGNOSIS — I2510 Atherosclerotic heart disease of native coronary artery without angina pectoris: Secondary | ICD-10-CM

## 2012-08-20 DIAGNOSIS — E785 Hyperlipidemia, unspecified: Secondary | ICD-10-CM

## 2012-08-20 LAB — HEPATIC FUNCTION PANEL
ALT: 13 U/L (ref 0–53)
AST: 14 U/L (ref 0–37)
Albumin: 3.4 g/dL — ABNORMAL LOW (ref 3.5–5.2)
Alkaline Phosphatase: 87 U/L (ref 39–117)
Bilirubin, Direct: 0.1 mg/dL (ref 0.0–0.3)
Total Bilirubin: 0.3 mg/dL (ref 0.3–1.2)
Total Protein: 6.5 g/dL (ref 6.0–8.3)

## 2012-08-20 LAB — BASIC METABOLIC PANEL
BUN: 21 mg/dL (ref 6–23)
CO2: 25 mEq/L (ref 19–32)
Calcium: 8.9 mg/dL (ref 8.4–10.5)
Chloride: 103 mEq/L (ref 96–112)
Creatinine, Ser: 1.5 mg/dL (ref 0.4–1.5)
GFR: 50.96 mL/min — ABNORMAL LOW (ref >60.00)
Glucose, Bld: 112 mg/dL — ABNORMAL HIGH (ref 70–99)
Potassium: 4.4 mEq/L (ref 3.5–5.1)
Sodium: 137 mEq/L (ref 135–145)

## 2012-08-20 LAB — LIPID PANEL
Cholesterol: 173 mg/dL (ref 0–200)
HDL: 51.4 mg/dL (ref >39.00)
LDL Cholesterol: 105 mg/dL — ABNORMAL HIGH (ref 0–99)
Total CHOL/HDL Ratio: 3
Triglycerides: 85 mg/dL (ref 0.0–149.0)
VLDL: 17 mg/dL (ref 0.0–40.0)

## 2012-08-20 NOTE — Assessment & Plan Note (Signed)
Curtis Perez is doing well. He's not having any angina. He continues to have musculoskeletal pain from his surgery. I've encouraged him to stop smoking. I've encouraged him to get out and exercise on a regular basis. I'll see him again in 6 months for followup visit. We'll check fasting lipids, hepatic profile, and basic metabolic profile at that time.

## 2012-08-20 NOTE — Patient Instructions (Addendum)
Your physician recommends that you return for a FASTING lipid profile: TODAY  Your physician wants you to follow-up in: 6 MONTHS  You will receive a reminder letter in the mail two months in advance. If you don't receive a letter, please call our office to schedule the follow-up appointment.   

## 2012-08-20 NOTE — Progress Notes (Signed)
Curtis Perez Date of Birth  [birth year]       [facility] Office [address]  [address], [address] [place], [place]  [address]   [place], [place]  [address] [phone]     [phone]   Fax  [phone]    Fax [phone]  Problem List: 1. Coronary artery disease-status post stenting of his left circumflex artery many years ago-status post CABG in April, 2013 2. Hyperlipidemia  History of Present Illness:  Curtis Perez is  a 61 year old gentleman with a history of coronary artery disease. He had coronary artery bypass grafting several months ago. He is making progress but today he's not feeling quite as well as he has been.  He's had a couple of this episodes of musculoskeletal pain.  No angina.  Unfortunately, he continues to smoke.  He has completed cardiac rehab and is in the maintenance.   Current Outpatient Prescriptions on File Prior to Visit  Medication Sig Dispense Refill  . aspirin 325 MG tablet Take 325 mg by mouth daily.      Marland Kitchen atenolol (TENORMIN) 50 MG tablet Take 50 mg by mouth daily.      Jennette Banker Sodium 30-100 MG CAPS Take 100 mg by mouth daily. Stool Softner      . Cyanocobalamin (VITAMIN B-12 PO) Take 1,000 mcg by mouth daily.       Marland Kitchen esomeprazole (NEXIUM) 40 MG capsule Take 1 capsule (40 mg total) by mouth daily.  30 capsule  1  . lisinopril (PRINIVIL,ZESTRIL) 10 MG tablet Take 1 tablet (10 mg total) by mouth daily.  30 tablet  5  . Magnesium 500 MG CAPS Take 500 mg by mouth daily.      . Omega-3 Fatty Acids (FISH OIL PO) Take 2,000 mg by mouth daily.      Marland Kitchen omeprazole (PRILOSEC) 20 MG capsule Take 20 mg by mouth daily.      . pravastatin (PRAVACHOL) 40 MG tablet Take 40 mg by mouth daily.      . Vitamin D, Ergocalciferol, (DRISDOL) 50000 UNITS CAPS Take 50,000 Units by mouth See admin instructions. Taking Four Times per Week. mon wed fri sat        No Known Allergies  Past Medical History  Diagnosis  Date  . Hypertension   . Hyperlipidemia   . Bradycardia   . Myocardial infarction     2004  . Tobacco abuse 03/28/2012  . 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 CAD (coronary artery disease)     remote MI in 2004 with PCI; s/p CABG x 3 03/2012  . Adenomatous colon polyp 02/15/2009  . Family history of malignant neoplasm of gastrointestinal tract   . GERD (gastroesophageal reflux disease)     Past Surgical History  Procedure Date  . Tonsillectomy   . Knee surgery   . Back surgery   . Coronary angioplasty with stent placement     about 8 years ago at [facility]  . Colonoscopy 02/15/2009  . Coronary artery bypass graft 04/02/2012    Procedure: CORONARY ARTERY BYPASS GRAFTING (CABG);  Surgeon: Purcell Nails, MD;  Location: [facility] OR;  Service: Open Heart Surgery;  Laterality: N/A;  On pump, times three graphs using endoscopically harvested right greater saphenous vein and left internal mammary artery.     History  Smoking status  .  Current Everyday Smoker -- 1.0 packs/day for 40 years  . Types: Cigarettes  Smokeless tobacco  . Never Used  Comment: down from 2ppd    History  Alcohol Use No    Rarely    Family History  Problem Relation Age of Onset  . Colon cancer Father   . Hypertension Mother   . Alzheimer's disease Mother   . Colon cancer Paternal Grandmother     Reviw of Systems:  Reviewed in the HPI.  All other systems are negative.  Physical Exam: Blood pressure 144/74, pulse 65, height 6\' 2"  (1.88 m), weight 254 lb 6.4 oz (115.395 kg), SpO2 97.00%. General: Well developed, well nourished, in no acute distress.  Head: Normocephalic, atraumatic, sclera non-icteric, mucus membranes are moist,   Neck: Supple. Carotids are 2 + without bruits. No JVD  Lungs: Clear bilaterally to auscultation.  Heart: regular rate.  normal  S1 S2. His incision is healing fairly well.    Abdomen: Soft, non-tender,  non-distended with normal bowel sounds. No hepatomegaly. No rebound/guarding. No masses.  Msk:  Strength and tone are normal  Extremities: No clubbing or cyanosis. No edema.  Distal pedal pulses are 2+ and equal bilaterally.  Neuro: Alert and oriented X 3. Moves all extremities spontaneously.  Psych:  Responds to questions appropriately with a normal affect.  ECG:   Assessment / Plan:

## 2012-08-21 ENCOUNTER — Encounter (HOSPITAL_COMMUNITY): Payer: Self-pay

## 2012-08-21 DIAGNOSIS — E785 Hyperlipidemia, unspecified: Secondary | ICD-10-CM | POA: Insufficient documentation

## 2012-08-21 DIAGNOSIS — I2510 Atherosclerotic heart disease of native coronary artery without angina pectoris: Secondary | ICD-10-CM | POA: Insufficient documentation

## 2012-08-21 DIAGNOSIS — I10 Essential (primary) hypertension: Secondary | ICD-10-CM | POA: Insufficient documentation

## 2012-08-21 DIAGNOSIS — Z951 Presence of aortocoronary bypass graft: Secondary | ICD-10-CM | POA: Insufficient documentation

## 2012-08-21 DIAGNOSIS — Z7982 Long term (current) use of aspirin: Secondary | ICD-10-CM | POA: Insufficient documentation

## 2012-08-21 DIAGNOSIS — F17200 Nicotine dependence, unspecified, uncomplicated: Secondary | ICD-10-CM | POA: Insufficient documentation

## 2012-08-21 DIAGNOSIS — Z9861 Coronary angioplasty status: Secondary | ICD-10-CM | POA: Insufficient documentation

## 2012-08-21 DIAGNOSIS — I498 Other specified cardiac arrhythmias: Secondary | ICD-10-CM | POA: Insufficient documentation

## 2012-08-21 DIAGNOSIS — I200 Unstable angina: Secondary | ICD-10-CM | POA: Insufficient documentation

## 2012-08-21 DIAGNOSIS — Z5189 Encounter for other specified aftercare: Secondary | ICD-10-CM | POA: Insufficient documentation

## 2012-08-21 DIAGNOSIS — I252 Old myocardial infarction: Secondary | ICD-10-CM | POA: Insufficient documentation

## 2012-08-23 ENCOUNTER — Encounter (HOSPITAL_COMMUNITY)
Admission: RE | Admit: 2012-08-23 | Discharge: 2012-08-23 | Disposition: A | Discharge status: Home / Self Care | Payer: Self-pay | Source: Ambulatory Visit | Attending: Cardiovascular Disease | Admitting: Cardiovascular Disease

## 2012-08-26 ENCOUNTER — Encounter (HOSPITAL_COMMUNITY)
Admission: RE | Admit: 2012-08-26 | Discharge: 2012-08-26 | Disposition: A | Discharge status: Home / Self Care | Payer: Self-pay | Source: Ambulatory Visit | Attending: Cardiovascular Disease | Admitting: Cardiovascular Disease

## 2012-08-28 ENCOUNTER — Encounter (HOSPITAL_COMMUNITY)
Admission: RE | Admit: 2012-08-28 | Discharge: 2012-08-28 | Disposition: A | Discharge status: Home / Self Care | Payer: Self-pay | Source: Ambulatory Visit | Attending: Cardiovascular Disease | Admitting: Cardiovascular Disease

## 2012-08-29 ENCOUNTER — Other Ambulatory Visit: Payer: Self-pay | Admitting: Gastroenterology

## 2012-08-30 ENCOUNTER — Encounter (HOSPITAL_COMMUNITY): Payer: Self-pay

## 2012-08-30 ENCOUNTER — Telehealth: Payer: Self-pay | Admitting: Gastroenterology

## 2012-08-30 MED ORDER — ESOMEPRAZOLE MAGNESIUM 40 MG PO CPDR: 40.0000 mg | DELAYED_RELEASE_CAPSULE | Freq: Every day | ORAL | Status: DC

## 2012-08-30 NOTE — Telephone Encounter (Signed)
Pt stated he called yesterday for a 90 day supply of Nexium and the store did not have the order. Apologized and placed the order.

## 2012-09-02 ENCOUNTER — Encounter (HOSPITAL_COMMUNITY)
Admission: RE | Admit: 2012-09-02 | Discharge: 2012-09-02 | Disposition: A | Discharge status: Home / Self Care | Payer: Self-pay | Source: Ambulatory Visit | Attending: Cardiovascular Disease | Admitting: Cardiovascular Disease

## 2012-09-04 ENCOUNTER — Encounter (HOSPITAL_COMMUNITY)
Admission: RE | Admit: 2012-09-04 | Discharge: 2012-09-04 | Disposition: A | Discharge status: Home / Self Care | Payer: Self-pay | Source: Ambulatory Visit | Attending: Cardiovascular Disease | Admitting: Cardiovascular Disease

## 2012-09-06 ENCOUNTER — Encounter (HOSPITAL_COMMUNITY)
Admission: RE | Admit: 2012-09-06 | Discharge: 2012-09-06 | Disposition: A | Discharge status: Home / Self Care | Payer: Self-pay | Source: Ambulatory Visit | Attending: Cardiovascular Disease | Admitting: Cardiovascular Disease

## 2012-09-09 ENCOUNTER — Encounter (HOSPITAL_COMMUNITY): Payer: Self-pay

## 2012-09-11 ENCOUNTER — Encounter (HOSPITAL_COMMUNITY): Payer: Self-pay

## 2012-09-13 ENCOUNTER — Encounter (HOSPITAL_COMMUNITY)
Admission: RE | Admit: 2012-09-13 | Discharge: 2012-09-13 | Disposition: A | Discharge status: Home / Self Care | Payer: Self-pay | Source: Ambulatory Visit | Attending: Cardiovascular Disease | Admitting: Cardiovascular Disease

## 2012-09-16 ENCOUNTER — Encounter (HOSPITAL_COMMUNITY)
Admission: RE | Admit: 2012-09-16 | Discharge: 2012-09-16 | Disposition: A | Discharge status: Home / Self Care | Payer: Self-pay | Source: Ambulatory Visit | Attending: Cardiovascular Disease | Admitting: Cardiovascular Disease

## 2012-09-18 ENCOUNTER — Encounter (HOSPITAL_COMMUNITY): Payer: Self-pay

## 2012-09-18 DIAGNOSIS — Z951 Presence of aortocoronary bypass graft: Secondary | ICD-10-CM | POA: Insufficient documentation

## 2012-09-18 DIAGNOSIS — I200 Unstable angina: Secondary | ICD-10-CM | POA: Insufficient documentation

## 2012-09-18 DIAGNOSIS — Z5189 Encounter for other specified aftercare: Secondary | ICD-10-CM | POA: Insufficient documentation

## 2012-09-18 DIAGNOSIS — E785 Hyperlipidemia, unspecified: Secondary | ICD-10-CM | POA: Insufficient documentation

## 2012-09-18 DIAGNOSIS — F17200 Nicotine dependence, unspecified, uncomplicated: Secondary | ICD-10-CM | POA: Insufficient documentation

## 2012-09-18 DIAGNOSIS — I10 Essential (primary) hypertension: Secondary | ICD-10-CM | POA: Insufficient documentation

## 2012-09-18 DIAGNOSIS — I252 Old myocardial infarction: Secondary | ICD-10-CM | POA: Insufficient documentation

## 2012-09-18 DIAGNOSIS — Z7982 Long term (current) use of aspirin: Secondary | ICD-10-CM | POA: Insufficient documentation

## 2012-09-18 DIAGNOSIS — I2510 Atherosclerotic heart disease of native coronary artery without angina pectoris: Secondary | ICD-10-CM | POA: Insufficient documentation

## 2012-09-18 DIAGNOSIS — Z9861 Coronary angioplasty status: Secondary | ICD-10-CM | POA: Insufficient documentation

## 2012-09-18 DIAGNOSIS — I498 Other specified cardiac arrhythmias: Secondary | ICD-10-CM | POA: Insufficient documentation

## 2012-09-20 ENCOUNTER — Encounter (HOSPITAL_COMMUNITY)
Admission: RE | Admit: 2012-09-20 | Discharge: 2012-09-20 | Disposition: A | Discharge status: Home / Self Care | Payer: Self-pay | Source: Ambulatory Visit | Attending: Cardiovascular Disease | Admitting: Cardiovascular Disease

## 2012-09-23 ENCOUNTER — Encounter (HOSPITAL_COMMUNITY)
Admission: RE | Admit: 2012-09-23 | Discharge: 2012-09-23 | Disposition: A | Discharge status: Home / Self Care | Payer: Self-pay | Source: Ambulatory Visit | Attending: Cardiovascular Disease | Admitting: Cardiovascular Disease

## 2012-09-25 ENCOUNTER — Encounter (HOSPITAL_COMMUNITY)
Admission: RE | Admit: 2012-09-25 | Discharge: 2012-09-25 | Disposition: A | Discharge status: Home / Self Care | Payer: Self-pay | Source: Ambulatory Visit | Attending: Cardiovascular Disease | Admitting: Cardiovascular Disease

## 2012-09-27 ENCOUNTER — Encounter (HOSPITAL_COMMUNITY)
Admission: RE | Admit: 2012-09-27 | Discharge: 2012-09-27 | Disposition: A | Discharge status: Home / Self Care | Payer: Self-pay | Source: Ambulatory Visit | Attending: Cardiovascular Disease | Admitting: Cardiovascular Disease

## 2012-09-30 ENCOUNTER — Encounter (HOSPITAL_COMMUNITY): Payer: Self-pay

## 2012-10-01 ENCOUNTER — Telehealth: Payer: Self-pay | Admitting: Gastroenterology

## 2012-10-01 ENCOUNTER — Other Ambulatory Visit: Payer: Self-pay | Admitting: Cardiovascular Disease

## 2012-10-01 NOTE — Telephone Encounter (Signed)
New problem:    90 day refill instead of 30 days .

## 2012-10-01 NOTE — Telephone Encounter (Signed)
Pt wanted a 90 day supply of Nexium and he was having trouble getting it. Tried to call [employer] and was on hold for > 15 minutes and then was transferred to another company, [employer]. Called [facility] and she took the discount card info from a Nexium sample and pt can now get Nexium 90 capsules for $70. Pt informed.

## 2012-10-02 ENCOUNTER — Encounter (HOSPITAL_COMMUNITY): Payer: Self-pay

## 2012-10-02 MED ORDER — LISINOPRIL 10 MG PO TABS: 10.0000 mg | ORAL_TABLET | Freq: Every day | ORAL | Status: DC

## 2012-10-02 NOTE — Telephone Encounter (Signed)
Fax Received. Refill Completed. Anwitha Mapes Chowoe (R.M.A)   

## 2012-10-03 NOTE — Telephone Encounter (Signed)
message answered

## 2012-10-04 ENCOUNTER — Encounter (HOSPITAL_COMMUNITY): Payer: Self-pay

## 2012-10-07 ENCOUNTER — Encounter (HOSPITAL_COMMUNITY): Payer: Self-pay

## 2012-10-09 ENCOUNTER — Encounter (HOSPITAL_COMMUNITY): Payer: Self-pay

## 2012-10-11 ENCOUNTER — Encounter (HOSPITAL_COMMUNITY): Payer: Self-pay

## 2012-10-14 ENCOUNTER — Encounter (HOSPITAL_COMMUNITY)
Admission: RE | Admit: 2012-10-14 | Discharge: 2012-10-14 | Disposition: A | Discharge status: Home / Self Care | Payer: Self-pay | Source: Ambulatory Visit | Attending: Cardiovascular Disease | Admitting: Cardiovascular Disease

## 2012-10-16 ENCOUNTER — Encounter (HOSPITAL_COMMUNITY): Payer: Self-pay

## 2012-10-18 ENCOUNTER — Encounter (HOSPITAL_COMMUNITY): Payer: Self-pay

## 2012-10-18 DIAGNOSIS — I10 Essential (primary) hypertension: Secondary | ICD-10-CM | POA: Insufficient documentation

## 2012-10-18 DIAGNOSIS — I252 Old myocardial infarction: Secondary | ICD-10-CM | POA: Insufficient documentation

## 2012-10-18 DIAGNOSIS — E785 Hyperlipidemia, unspecified: Secondary | ICD-10-CM | POA: Insufficient documentation

## 2012-10-18 DIAGNOSIS — Z9861 Coronary angioplasty status: Secondary | ICD-10-CM | POA: Insufficient documentation

## 2012-10-18 DIAGNOSIS — F17200 Nicotine dependence, unspecified, uncomplicated: Secondary | ICD-10-CM | POA: Insufficient documentation

## 2012-10-18 DIAGNOSIS — Z5189 Encounter for other specified aftercare: Secondary | ICD-10-CM | POA: Insufficient documentation

## 2012-10-18 DIAGNOSIS — Z7982 Long term (current) use of aspirin: Secondary | ICD-10-CM | POA: Insufficient documentation

## 2012-10-18 DIAGNOSIS — Z951 Presence of aortocoronary bypass graft: Secondary | ICD-10-CM | POA: Insufficient documentation

## 2012-10-18 DIAGNOSIS — I498 Other specified cardiac arrhythmias: Secondary | ICD-10-CM | POA: Insufficient documentation

## 2012-10-18 DIAGNOSIS — I2510 Atherosclerotic heart disease of native coronary artery without angina pectoris: Secondary | ICD-10-CM | POA: Insufficient documentation

## 2012-10-18 DIAGNOSIS — I200 Unstable angina: Secondary | ICD-10-CM | POA: Insufficient documentation

## 2012-10-21 ENCOUNTER — Encounter (HOSPITAL_COMMUNITY)
Admission: RE | Admit: 2012-10-21 | Discharge: 2012-10-21 | Disposition: A | Discharge status: Home / Self Care | Payer: Self-pay | Source: Ambulatory Visit | Attending: Cardiovascular Disease | Admitting: Cardiovascular Disease

## 2012-10-23 ENCOUNTER — Encounter (HOSPITAL_COMMUNITY): Payer: Self-pay

## 2012-10-25 ENCOUNTER — Encounter (HOSPITAL_COMMUNITY)
Admission: RE | Admit: 2012-10-25 | Discharge: 2012-10-25 | Disposition: A | Discharge status: Home / Self Care | Payer: Self-pay | Source: Ambulatory Visit | Attending: Cardiovascular Disease | Admitting: Cardiovascular Disease

## 2012-10-28 ENCOUNTER — Encounter (HOSPITAL_COMMUNITY)
Admission: RE | Admit: 2012-10-28 | Discharge: 2012-10-28 | Disposition: A | Discharge status: Home / Self Care | Payer: Self-pay | Source: Ambulatory Visit | Attending: Cardiovascular Disease | Admitting: Cardiovascular Disease

## 2012-10-30 ENCOUNTER — Encounter (HOSPITAL_COMMUNITY): Payer: Self-pay

## 2012-11-01 ENCOUNTER — Encounter (HOSPITAL_COMMUNITY): Payer: Self-pay

## 2012-11-04 ENCOUNTER — Encounter (HOSPITAL_COMMUNITY): Payer: Self-pay

## 2012-11-06 ENCOUNTER — Encounter (HOSPITAL_COMMUNITY): Payer: Self-pay

## 2012-11-08 ENCOUNTER — Encounter (HOSPITAL_COMMUNITY): Payer: Self-pay

## 2012-11-11 ENCOUNTER — Encounter (HOSPITAL_COMMUNITY): Payer: Self-pay

## 2012-11-12 ENCOUNTER — Telehealth (HOSPITAL_COMMUNITY): Payer: Self-pay | Admitting: *Deleted

## 2012-11-13 ENCOUNTER — Encounter (HOSPITAL_COMMUNITY): Payer: Self-pay

## 2012-11-18 ENCOUNTER — Encounter (HOSPITAL_COMMUNITY): Payer: BC Managed Care – PPO | Attending: Cardiovascular Disease

## 2012-11-18 DIAGNOSIS — I252 Old myocardial infarction: Secondary | ICD-10-CM | POA: Insufficient documentation

## 2012-11-18 DIAGNOSIS — I200 Unstable angina: Secondary | ICD-10-CM | POA: Insufficient documentation

## 2012-11-18 DIAGNOSIS — Z951 Presence of aortocoronary bypass graft: Secondary | ICD-10-CM | POA: Insufficient documentation

## 2012-11-18 DIAGNOSIS — Z5189 Encounter for other specified aftercare: Secondary | ICD-10-CM | POA: Insufficient documentation

## 2012-11-18 DIAGNOSIS — I498 Other specified cardiac arrhythmias: Secondary | ICD-10-CM | POA: Insufficient documentation

## 2012-11-18 DIAGNOSIS — I2510 Atherosclerotic heart disease of native coronary artery without angina pectoris: Secondary | ICD-10-CM | POA: Insufficient documentation

## 2012-11-18 DIAGNOSIS — F17200 Nicotine dependence, unspecified, uncomplicated: Secondary | ICD-10-CM | POA: Insufficient documentation

## 2012-11-18 DIAGNOSIS — Z7982 Long term (current) use of aspirin: Secondary | ICD-10-CM | POA: Insufficient documentation

## 2012-11-18 DIAGNOSIS — E785 Hyperlipidemia, unspecified: Secondary | ICD-10-CM | POA: Insufficient documentation

## 2012-11-18 DIAGNOSIS — I10 Essential (primary) hypertension: Secondary | ICD-10-CM | POA: Insufficient documentation

## 2012-11-18 DIAGNOSIS — Z9861 Coronary angioplasty status: Secondary | ICD-10-CM | POA: Insufficient documentation

## 2012-11-20 ENCOUNTER — Encounter (HOSPITAL_COMMUNITY): Payer: BC Managed Care – PPO

## 2012-11-22 ENCOUNTER — Encounter (HOSPITAL_COMMUNITY): Payer: BC Managed Care – PPO

## 2012-11-25 ENCOUNTER — Encounter (HOSPITAL_COMMUNITY): Payer: BC Managed Care – PPO

## 2012-11-27 ENCOUNTER — Encounter (HOSPITAL_COMMUNITY): Payer: BC Managed Care – PPO

## 2012-11-29 ENCOUNTER — Encounter (HOSPITAL_COMMUNITY): Payer: BC Managed Care – PPO

## 2012-12-02 ENCOUNTER — Encounter (HOSPITAL_COMMUNITY): Payer: BC Managed Care – PPO

## 2012-12-04 ENCOUNTER — Telehealth: Payer: Self-pay | Admitting: Gastroenterology

## 2012-12-04 ENCOUNTER — Encounter (HOSPITAL_COMMUNITY): Payer: BC Managed Care – PPO

## 2012-12-04 NOTE — Telephone Encounter (Signed)
lmom for pt to call back. Last OV 06/18/12 and he was placed on Nexium d/t reflux and possible esophageal spasm. ECL 06/28/12; EGD normal and note states probable tx GERD.

## 2012-12-05 NOTE — Telephone Encounter (Signed)
Pt reports he had another "attack" Tuesday PM and the pain was very severe; so severe he hasn't eaten anything since then. He reports he had no new foods or spicy foods to bring on the problem. He stated he also began sweating the pain was so severe. I asked about Cardiac hx and he states he knows it's GI because he took Gas X and when he burps the pain goes away.  Pt is on Nexium daily. Please call on cell. Please advise. Thanks.

## 2012-12-06 ENCOUNTER — Encounter (HOSPITAL_COMMUNITY): Payer: BC Managed Care – PPO

## 2012-12-06 MED ORDER — HYOSCYAMINE SULFATE 0.125 MG SL SUBL: 0.1250 mg | SUBLINGUAL_TABLET | SUBLINGUAL | Status: DC | PRN

## 2012-12-06 NOTE — Telephone Encounter (Signed)
He has a long history of cardiac disease and coronary artery disease and needs to call cardiology.  He can go up on his Nexium to twice a day and use when necessary Levsin in the interim.

## 2012-12-06 NOTE — Telephone Encounter (Signed)
Informed pt of Dr Norval Gable recommendations and he would like for him to call cardiology; pt was in the middle of an attack when I called. Also instructed him to increase the Nexium to BID and use Levsin prn in the meantime. I ordered the Levsin and pt stated understanding.

## 2012-12-09 ENCOUNTER — Encounter (HOSPITAL_COMMUNITY): Payer: BC Managed Care – PPO

## 2012-12-13 ENCOUNTER — Encounter (HOSPITAL_COMMUNITY): Admission: RE | Admit: 2012-12-13 | Payer: BC Managed Care – PPO | Source: Ambulatory Visit

## 2012-12-13 ENCOUNTER — Telehealth (HOSPITAL_COMMUNITY): Payer: Self-pay | Admitting: *Deleted

## 2012-12-16 ENCOUNTER — Encounter (HOSPITAL_COMMUNITY): Payer: BC Managed Care – PPO

## 2012-12-20 ENCOUNTER — Encounter (HOSPITAL_COMMUNITY): Payer: BC Managed Care – PPO | Attending: Cardiovascular Disease

## 2012-12-20 DIAGNOSIS — I252 Old myocardial infarction: Secondary | ICD-10-CM | POA: Insufficient documentation

## 2012-12-20 DIAGNOSIS — I2510 Atherosclerotic heart disease of native coronary artery without angina pectoris: Secondary | ICD-10-CM | POA: Insufficient documentation

## 2012-12-20 DIAGNOSIS — Z9861 Coronary angioplasty status: Secondary | ICD-10-CM | POA: Insufficient documentation

## 2012-12-20 DIAGNOSIS — I498 Other specified cardiac arrhythmias: Secondary | ICD-10-CM | POA: Insufficient documentation

## 2012-12-20 DIAGNOSIS — I10 Essential (primary) hypertension: Secondary | ICD-10-CM | POA: Insufficient documentation

## 2012-12-20 DIAGNOSIS — Z5189 Encounter for other specified aftercare: Secondary | ICD-10-CM | POA: Insufficient documentation

## 2012-12-20 DIAGNOSIS — E785 Hyperlipidemia, unspecified: Secondary | ICD-10-CM | POA: Insufficient documentation

## 2012-12-20 DIAGNOSIS — Z7982 Long term (current) use of aspirin: Secondary | ICD-10-CM | POA: Insufficient documentation

## 2012-12-20 DIAGNOSIS — F17200 Nicotine dependence, unspecified, uncomplicated: Secondary | ICD-10-CM | POA: Insufficient documentation

## 2012-12-20 DIAGNOSIS — I200 Unstable angina: Secondary | ICD-10-CM | POA: Insufficient documentation

## 2012-12-20 DIAGNOSIS — Z951 Presence of aortocoronary bypass graft: Secondary | ICD-10-CM | POA: Insufficient documentation

## 2012-12-23 ENCOUNTER — Encounter (HOSPITAL_COMMUNITY): Payer: BC Managed Care – PPO

## 2012-12-25 ENCOUNTER — Encounter (HOSPITAL_COMMUNITY): Payer: BC Managed Care – PPO

## 2012-12-27 ENCOUNTER — Encounter (HOSPITAL_COMMUNITY): Payer: BC Managed Care – PPO

## 2012-12-30 ENCOUNTER — Encounter (HOSPITAL_COMMUNITY): Payer: BC Managed Care – PPO

## 2013-01-01 ENCOUNTER — Encounter (HOSPITAL_COMMUNITY): Payer: BC Managed Care – PPO

## 2013-01-03 ENCOUNTER — Encounter (HOSPITAL_COMMUNITY): Payer: BC Managed Care – PPO

## 2013-01-06 ENCOUNTER — Encounter (HOSPITAL_COMMUNITY): Payer: BC Managed Care – PPO

## 2013-01-06 ENCOUNTER — Encounter: Payer: Self-pay | Admitting: Cardiovascular Disease

## 2013-01-08 ENCOUNTER — Encounter (HOSPITAL_COMMUNITY): Payer: BC Managed Care – PPO

## 2013-01-10 ENCOUNTER — Encounter (HOSPITAL_COMMUNITY): Payer: BC Managed Care – PPO

## 2013-01-13 ENCOUNTER — Encounter (HOSPITAL_COMMUNITY): Payer: BC Managed Care – PPO

## 2013-01-15 ENCOUNTER — Encounter (HOSPITAL_COMMUNITY): Payer: BC Managed Care – PPO

## 2013-01-17 ENCOUNTER — Encounter (HOSPITAL_COMMUNITY): Payer: BC Managed Care – PPO

## 2013-01-20 ENCOUNTER — Encounter (HOSPITAL_COMMUNITY): Payer: BC Managed Care – PPO | Attending: Cardiovascular Disease

## 2013-01-20 DIAGNOSIS — Z7982 Long term (current) use of aspirin: Secondary | ICD-10-CM | POA: Insufficient documentation

## 2013-01-20 DIAGNOSIS — Z9861 Coronary angioplasty status: Secondary | ICD-10-CM | POA: Insufficient documentation

## 2013-01-20 DIAGNOSIS — I10 Essential (primary) hypertension: Secondary | ICD-10-CM | POA: Insufficient documentation

## 2013-01-20 DIAGNOSIS — Z5189 Encounter for other specified aftercare: Secondary | ICD-10-CM | POA: Insufficient documentation

## 2013-01-20 DIAGNOSIS — I498 Other specified cardiac arrhythmias: Secondary | ICD-10-CM | POA: Insufficient documentation

## 2013-01-20 DIAGNOSIS — F17200 Nicotine dependence, unspecified, uncomplicated: Secondary | ICD-10-CM | POA: Insufficient documentation

## 2013-01-20 DIAGNOSIS — I200 Unstable angina: Secondary | ICD-10-CM | POA: Insufficient documentation

## 2013-01-20 DIAGNOSIS — Z951 Presence of aortocoronary bypass graft: Secondary | ICD-10-CM | POA: Insufficient documentation

## 2013-01-20 DIAGNOSIS — E785 Hyperlipidemia, unspecified: Secondary | ICD-10-CM | POA: Insufficient documentation

## 2013-01-20 DIAGNOSIS — I252 Old myocardial infarction: Secondary | ICD-10-CM | POA: Insufficient documentation

## 2013-01-20 DIAGNOSIS — I2510 Atherosclerotic heart disease of native coronary artery without angina pectoris: Secondary | ICD-10-CM | POA: Insufficient documentation

## 2013-01-22 ENCOUNTER — Encounter (HOSPITAL_COMMUNITY): Payer: BC Managed Care – PPO

## 2013-01-24 ENCOUNTER — Encounter (HOSPITAL_COMMUNITY): Payer: BC Managed Care – PPO

## 2013-01-27 ENCOUNTER — Encounter (HOSPITAL_COMMUNITY): Payer: BC Managed Care – PPO

## 2013-01-29 ENCOUNTER — Encounter (HOSPITAL_COMMUNITY): Payer: BC Managed Care – PPO

## 2013-01-31 ENCOUNTER — Encounter (HOSPITAL_COMMUNITY): Payer: BC Managed Care – PPO

## 2013-02-03 ENCOUNTER — Encounter (HOSPITAL_COMMUNITY): Payer: BC Managed Care – PPO

## 2013-02-05 ENCOUNTER — Encounter (HOSPITAL_COMMUNITY): Payer: BC Managed Care – PPO

## 2013-02-07 ENCOUNTER — Encounter (HOSPITAL_COMMUNITY): Payer: BC Managed Care – PPO

## 2013-02-10 ENCOUNTER — Encounter (HOSPITAL_COMMUNITY): Payer: BC Managed Care – PPO

## 2013-02-12 ENCOUNTER — Encounter (HOSPITAL_COMMUNITY): Payer: BC Managed Care – PPO

## 2013-02-14 ENCOUNTER — Encounter (HOSPITAL_COMMUNITY): Payer: BC Managed Care – PPO

## 2013-02-17 ENCOUNTER — Encounter (HOSPITAL_COMMUNITY): Payer: BC Managed Care – PPO

## 2013-02-19 ENCOUNTER — Encounter (HOSPITAL_COMMUNITY): Payer: BC Managed Care – PPO

## 2013-02-21 ENCOUNTER — Encounter (HOSPITAL_COMMUNITY): Payer: BC Managed Care – PPO

## 2013-02-24 ENCOUNTER — Encounter (HOSPITAL_COMMUNITY): Payer: BC Managed Care – PPO

## 2013-02-26 ENCOUNTER — Encounter (HOSPITAL_COMMUNITY): Payer: BC Managed Care – PPO

## 2013-02-28 ENCOUNTER — Encounter (HOSPITAL_COMMUNITY): Payer: BC Managed Care – PPO

## 2013-03-03 ENCOUNTER — Encounter (HOSPITAL_COMMUNITY): Payer: BC Managed Care – PPO

## 2013-03-05 ENCOUNTER — Encounter (HOSPITAL_COMMUNITY): Payer: BC Managed Care – PPO

## 2013-03-07 ENCOUNTER — Encounter (HOSPITAL_COMMUNITY): Payer: BC Managed Care – PPO

## 2013-03-10 ENCOUNTER — Encounter (HOSPITAL_COMMUNITY): Payer: BC Managed Care – PPO

## 2013-03-12 ENCOUNTER — Encounter (HOSPITAL_COMMUNITY): Payer: BC Managed Care – PPO

## 2013-03-14 ENCOUNTER — Encounter (HOSPITAL_COMMUNITY): Payer: BC Managed Care – PPO

## 2013-03-17 ENCOUNTER — Encounter (HOSPITAL_COMMUNITY): Payer: BC Managed Care – PPO

## 2013-03-19 ENCOUNTER — Encounter (HOSPITAL_COMMUNITY): Payer: BC Managed Care – PPO

## 2013-03-21 ENCOUNTER — Encounter (HOSPITAL_COMMUNITY): Payer: BC Managed Care – PPO

## 2013-03-24 ENCOUNTER — Encounter (HOSPITAL_COMMUNITY): Payer: BC Managed Care – PPO

## 2013-03-26 ENCOUNTER — Encounter (HOSPITAL_COMMUNITY): Payer: BC Managed Care – PPO

## 2013-03-28 ENCOUNTER — Encounter (HOSPITAL_COMMUNITY): Payer: BC Managed Care – PPO

## 2013-03-31 ENCOUNTER — Encounter (HOSPITAL_COMMUNITY): Payer: BC Managed Care – PPO

## 2013-04-02 ENCOUNTER — Encounter (HOSPITAL_COMMUNITY): Payer: BC Managed Care – PPO

## 2013-04-04 ENCOUNTER — Encounter (HOSPITAL_COMMUNITY): Payer: BC Managed Care – PPO

## 2013-04-07 ENCOUNTER — Encounter (HOSPITAL_COMMUNITY): Payer: BC Managed Care – PPO

## 2013-04-09 ENCOUNTER — Encounter (HOSPITAL_COMMUNITY): Payer: BC Managed Care – PPO

## 2013-04-11 ENCOUNTER — Encounter (HOSPITAL_COMMUNITY): Payer: BC Managed Care – PPO

## 2013-04-14 ENCOUNTER — Encounter (HOSPITAL_COMMUNITY): Payer: BC Managed Care – PPO

## 2013-04-16 ENCOUNTER — Encounter (HOSPITAL_COMMUNITY): Payer: BC Managed Care – PPO

## 2013-04-18 ENCOUNTER — Encounter (HOSPITAL_COMMUNITY): Payer: BC Managed Care – PPO

## 2013-04-21 ENCOUNTER — Encounter (HOSPITAL_COMMUNITY): Payer: BC Managed Care – PPO

## 2013-04-23 ENCOUNTER — Encounter (HOSPITAL_COMMUNITY): Payer: BC Managed Care – PPO

## 2013-04-25 ENCOUNTER — Encounter (HOSPITAL_COMMUNITY): Payer: BC Managed Care – PPO

## 2013-04-28 ENCOUNTER — Encounter (HOSPITAL_COMMUNITY): Payer: BC Managed Care – PPO

## 2013-04-30 ENCOUNTER — Encounter (HOSPITAL_COMMUNITY): Payer: BC Managed Care – PPO

## 2013-05-02 ENCOUNTER — Encounter (HOSPITAL_COMMUNITY): Payer: BC Managed Care – PPO

## 2013-05-05 ENCOUNTER — Encounter (HOSPITAL_COMMUNITY): Payer: BC Managed Care – PPO

## 2013-05-07 ENCOUNTER — Encounter (HOSPITAL_COMMUNITY): Payer: BC Managed Care – PPO

## 2013-05-09 ENCOUNTER — Encounter (HOSPITAL_COMMUNITY): Payer: BC Managed Care – PPO

## 2013-05-14 ENCOUNTER — Encounter (HOSPITAL_COMMUNITY): Payer: BC Managed Care – PPO

## 2013-05-16 ENCOUNTER — Encounter (HOSPITAL_COMMUNITY): Payer: BC Managed Care – PPO

## 2013-05-19 ENCOUNTER — Encounter (HOSPITAL_COMMUNITY): Payer: BC Managed Care – PPO

## 2013-05-21 ENCOUNTER — Encounter (HOSPITAL_COMMUNITY): Payer: BC Managed Care – PPO

## 2013-05-23 ENCOUNTER — Encounter (HOSPITAL_COMMUNITY): Payer: BC Managed Care – PPO

## 2013-05-26 ENCOUNTER — Encounter (HOSPITAL_COMMUNITY): Payer: BC Managed Care – PPO

## 2013-05-28 ENCOUNTER — Encounter (HOSPITAL_COMMUNITY): Payer: BC Managed Care – PPO

## 2013-05-30 ENCOUNTER — Encounter (HOSPITAL_COMMUNITY): Payer: BC Managed Care – PPO

## 2013-06-02 ENCOUNTER — Encounter (HOSPITAL_COMMUNITY): Payer: BC Managed Care – PPO

## 2013-06-04 ENCOUNTER — Encounter (HOSPITAL_COMMUNITY): Payer: BC Managed Care – PPO

## 2013-06-06 ENCOUNTER — Encounter (HOSPITAL_COMMUNITY): Payer: BC Managed Care – PPO

## 2013-06-09 ENCOUNTER — Encounter (HOSPITAL_COMMUNITY): Payer: BC Managed Care – PPO

## 2013-06-11 ENCOUNTER — Encounter (HOSPITAL_COMMUNITY): Payer: BC Managed Care – PPO

## 2013-06-13 ENCOUNTER — Encounter (HOSPITAL_COMMUNITY): Payer: BC Managed Care – PPO

## 2013-06-16 ENCOUNTER — Encounter (HOSPITAL_COMMUNITY): Payer: BC Managed Care – PPO

## 2013-06-18 ENCOUNTER — Encounter (HOSPITAL_COMMUNITY): Payer: BC Managed Care – PPO

## 2013-06-23 ENCOUNTER — Encounter (HOSPITAL_COMMUNITY): Payer: BC Managed Care – PPO

## 2013-06-25 ENCOUNTER — Encounter (HOSPITAL_COMMUNITY): Payer: BC Managed Care – PPO

## 2013-06-27 ENCOUNTER — Encounter (HOSPITAL_COMMUNITY): Payer: BC Managed Care – PPO

## 2013-06-30 ENCOUNTER — Encounter (HOSPITAL_COMMUNITY): Payer: BC Managed Care – PPO

## 2013-07-02 ENCOUNTER — Encounter (HOSPITAL_COMMUNITY): Payer: BC Managed Care – PPO

## 2013-07-04 ENCOUNTER — Encounter (HOSPITAL_COMMUNITY): Payer: BC Managed Care – PPO

## 2013-07-07 ENCOUNTER — Encounter (HOSPITAL_COMMUNITY): Payer: BC Managed Care – PPO

## 2013-07-09 ENCOUNTER — Encounter (HOSPITAL_COMMUNITY): Payer: BC Managed Care – PPO

## 2013-07-11 ENCOUNTER — Encounter (HOSPITAL_COMMUNITY): Payer: BC Managed Care – PPO

## 2013-07-14 ENCOUNTER — Encounter (HOSPITAL_COMMUNITY): Payer: BC Managed Care – PPO

## 2013-07-16 ENCOUNTER — Encounter (HOSPITAL_COMMUNITY): Payer: BC Managed Care – PPO

## 2013-07-18 ENCOUNTER — Encounter (HOSPITAL_COMMUNITY): Payer: BC Managed Care – PPO

## 2013-07-21 ENCOUNTER — Encounter (HOSPITAL_COMMUNITY): Payer: BC Managed Care – PPO

## 2013-07-23 ENCOUNTER — Encounter (HOSPITAL_COMMUNITY): Payer: BC Managed Care – PPO

## 2013-07-25 ENCOUNTER — Encounter (HOSPITAL_COMMUNITY): Payer: BC Managed Care – PPO

## 2013-07-28 ENCOUNTER — Encounter (HOSPITAL_COMMUNITY): Payer: BC Managed Care – PPO

## 2013-07-30 ENCOUNTER — Encounter (HOSPITAL_COMMUNITY): Payer: BC Managed Care – PPO

## 2013-08-01 ENCOUNTER — Encounter (HOSPITAL_COMMUNITY): Payer: BC Managed Care – PPO

## 2013-08-04 ENCOUNTER — Encounter (HOSPITAL_COMMUNITY): Payer: BC Managed Care – PPO

## 2013-08-06 ENCOUNTER — Encounter (HOSPITAL_COMMUNITY): Payer: BC Managed Care – PPO

## 2013-08-08 ENCOUNTER — Encounter (HOSPITAL_COMMUNITY): Payer: BC Managed Care – PPO

## 2013-08-11 ENCOUNTER — Encounter (HOSPITAL_COMMUNITY): Payer: BC Managed Care – PPO

## 2013-08-13 ENCOUNTER — Encounter (HOSPITAL_COMMUNITY): Payer: BC Managed Care – PPO

## 2013-08-15 ENCOUNTER — Encounter (HOSPITAL_COMMUNITY): Payer: BC Managed Care – PPO

## 2013-11-07 ENCOUNTER — Other Ambulatory Visit: Payer: Self-pay | Admitting: Internal Medicine

## 2013-12-24 DIAGNOSIS — K219 Gastro-esophageal reflux disease without esophagitis: Secondary | ICD-10-CM | POA: Insufficient documentation

## 2013-12-24 DIAGNOSIS — E1122 Type 2 diabetes mellitus with diabetic chronic kidney disease: Secondary | ICD-10-CM | POA: Insufficient documentation

## 2013-12-24 DIAGNOSIS — E669 Obesity, unspecified: Secondary | ICD-10-CM | POA: Insufficient documentation

## 2013-12-24 DIAGNOSIS — E119 Type 2 diabetes mellitus without complications: Secondary | ICD-10-CM | POA: Insufficient documentation

## 2013-12-24 DIAGNOSIS — N182 Chronic kidney disease, stage 2 (mild): Secondary | ICD-10-CM | POA: Insufficient documentation

## 2013-12-24 DIAGNOSIS — E559 Vitamin D deficiency, unspecified: Secondary | ICD-10-CM | POA: Insufficient documentation

## 2013-12-25 ENCOUNTER — Encounter: Payer: Self-pay | Admitting: Internal Medicine

## 2013-12-25 ENCOUNTER — Ambulatory Visit (INDEPENDENT_AMBULATORY_CARE_PROVIDER_SITE_OTHER): Payer: No Typology Code available for payment source | Admitting: Internal Medicine

## 2013-12-25 VITALS — BP 138/80 | HR 64 | Temp 97.5°F | Resp 18 | Wt 270.0 lb

## 2013-12-25 DIAGNOSIS — E782 Mixed hyperlipidemia: Secondary | ICD-10-CM

## 2013-12-25 DIAGNOSIS — E559 Vitamin D deficiency, unspecified: Secondary | ICD-10-CM

## 2013-12-25 DIAGNOSIS — R7309 Other abnormal glucose: Secondary | ICD-10-CM

## 2013-12-25 DIAGNOSIS — I2510 Atherosclerotic heart disease of native coronary artery without angina pectoris: Secondary | ICD-10-CM

## 2013-12-25 DIAGNOSIS — Z79899 Other long term (current) drug therapy: Secondary | ICD-10-CM

## 2013-12-25 DIAGNOSIS — I10 Essential (primary) hypertension: Secondary | ICD-10-CM

## 2013-12-25 LAB — CBC WITH DIFFERENTIAL/PLATELET
Basophils Absolute: 0 10*3/uL (ref 0.0–0.1)
Basophils Relative: 0 % (ref 0–1)
Eosinophils Absolute: 0.2 10*3/uL (ref 0.0–0.7)
Eosinophils Relative: 3 % (ref 0–5)
HCT: 47.5 % (ref 39.0–52.0)
Hemoglobin: 16.7 g/dL (ref 13.0–17.0)
Lymphocytes Relative: 23 % (ref 12–46)
Lymphs Abs: 1.7 10*3/uL (ref 0.7–4.0)
MCH: 29.9 pg (ref 26.0–34.0)
MCHC: 35.2 g/dL (ref 30.0–36.0)
MCV: 85.1 fL (ref 78.0–100.0)
Monocytes Absolute: 0.5 10*3/uL (ref 0.1–1.0)
Monocytes Relative: 8 % (ref 3–12)
Neutro Abs: 4.7 10*3/uL (ref 1.7–7.7)
Neutrophils Relative %: 66 % (ref 43–77)
Platelets: 244 10*3/uL (ref 150–400)
RBC: 5.58 MIL/uL (ref 4.22–5.81)
RDW: 14.9 % (ref 11.5–15.5)
WBC: 7.2 10*3/uL (ref 4.0–10.5)

## 2013-12-25 NOTE — Patient Instructions (Signed)

## 2013-12-25 NOTE — Progress Notes (Signed)
Patient ID: Curtis Perez, male   DOB: March 20, 1951, 63 y.o.   MRN: [id number]   This very nice 63 y.o. SWM presents for 3 month follow up with Hypertension, ASHD/MI/CABG, Hyperlipidemia, Pre-Diabetes, GERD  and Vitamin D Deficiency.    HTN predates since 2005. BP has been controlled at home. Today's BP: 138/80 mmHg . Patient denies any cardiac type chest pain, palpitations, dyspnea/orthopnea/PND, dizziness, claudication, or dependent edema.   Hyperlipidemia is controlled with diet & meds. Last Cholesterol was 203, Triglycerides were 201, HDL 41 and LDL 122 and patient was recommended Rx Zetia which he apparently never got. Patient denies myalgias or other med SE's.    Also, the patient has history of PreDiabetes with A1c 6.2% in Mar 2011 and with last A1c of  6.1% in Jan 2014. Patient denies any symptoms of reactive hypoglycemia, diabetic polys, paresthesias or visual blurring.   Further, Patient has history of Vitamin D Deficiency with Vit D = 6 in 2008 and now with last vitamin D of 105 in June 2014 and dose of D1.25 mg was taper from 4 down to 3 x /week. Patient supplements vitamin D without any suspected side-effects. Patint also has GERD with Sx's controlled on Omeprazole 20 mg bid.  Current Outpatient Prescriptions on File Prior to Visit  Medication Sig Dispense Refill  . aspirin 325 MG tablet Take 325 mg by mouth daily.      Marland Kitchen atenolol (TENORMIN) 50 MG tablet Take 50 mg by mouth daily.      . Cyanocobalamin (VITAMIN B-12 PO) Take 1,000 mcg by mouth daily.       . hyoscyamine (LEVSIN SL) 0.125 MG SL tablet Place 1 tablet (0.125 mg total) under the tongue every 4 (four) hours as needed for cramping.  30 tablet  0  . lisinopril (PRINIVIL,ZESTRIL) 10 MG tablet TAKE 1 TABLET BY MOUTH EVERY DAY  90 tablet  1  . Magnesium 500 MG CAPS Take 500 mg by mouth daily.      . Omega-3 Fatty Acids (FISH OIL PO) Take 2,000 mg by mouth daily.      Marland Kitchen omeprazole (PRILOSEC) 20 MG capsule Take 20 mg by mouth  daily. Takes BID      . pravastatin (PRAVACHOL) 40 MG tablet Take 40 mg by mouth daily.      . Vitamin D, Ergocalciferol, (DRISDOL) 50000 UNITS CAPS Take 50,000 Units by mouth See admin instructions. Taking  three Times per Week. mon wed fri       No current facility-administered medications on file prior to visit.     No Known Allergies  PMHx:   Past Medical History  Diagnosis Date  . Hypertension   . Hyperlipidemia   . Bradycardia   . Myocardial infarction     2004  . Tobacco abuse 03/28/2012  . 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 CAD (coronary artery disease)     remote MI in 2004 with PCI; s/p CABG x 3 03/2012  . Adenomatous colon polyp 02/15/2009  . Family history of malignant neoplasm of gastrointestinal tract   . Prediabetes   . GERD (gastroesophageal reflux disease)   . Vitamin D deficiency   . Obesity     FHx:    Reviewed / unchanged  SHx:    Reviewed / unchanged  Systems Review: Constitutional: Denies fever, chills, wt changes, headaches, insomnia, fatigue, night sweats, change in  appetite. Eyes: Denies redness, blurred vision, diplopia, discharge, itchy, watery eyes.  ENT: Denies discharge, congestion, post nasal drip, epistaxis, sore throat, earache, hearing loss, dental pain, tinnitus, vertigo, sinus pain, snoring.  CV: Denies chest pain, palpitations, irregular heartbeat, syncope, dyspnea, diaphoresis, orthopnea, PND, claudication, edema. Respiratory: denies cough, dyspnea, DOE, pleurisy, hoarseness, laryngitis, wheezing.  Gastrointestinal: Denies dysphagia, odynophagia, heartburn, reflux, water brash, abdominal pain or cramps, nausea, vomiting, bloating, diarrhea, constipation, hematemesis, melena, hematochezia,  or hemorrhoids. Genitourinary: Denies dysuria, frequency, urgency, nocturia, hesitancy, discharge, hematuria, flank pain. Musculoskeletal: Denies arthralgias, myalgias, stiffness, jt. swelling,  pain, limp, strain/sprain.  Skin: Denies pruritus, rash, hives, warts, acne, eczema, change in skin lesion(s). Neuro: No weakness, tremor, incoordination, spasms, paresthesia, or pain. Psychiatric: Denies confusion, memory loss, or sensory loss. Endo: Denies change in weight, skin, hair change.  Heme/Lymph: No excessive bleeding, bruising, orenlarged lymph nodes.  Filed Vitals:   12/25/13 1613  BP: 138/80  Pulse: 64  Temp: 97.5 F (36.4 C)  Resp: 18    Estimated body mass index is 34.65 kg/(m^2) as calculated from the following:   Height as of 08/20/12: 6\' 2"  (1.88 m).   Weight as of this encounter: 270 lb (122.471 kg).  On Exam: Appears well nourished - in no distress. Eyes: PERRLA, EOMs, conjunctiva no swelling or erythema. Sinuses: No frontal/maxillary tenderness ENT/Mouth: EAC's clear, TM's nl w/o erythema, bulging. Nares clear w/o erythema, swelling, exudates. Oropharynx clear without erythema or exudates. Oral hygiene is good. Tongue normal, non obstructing. Hearing intact.  Neck: Supple. Thyroid nl. Car 2+/2+ without bruits, nodes or JVD. Chest: Respirations nl with BS clear & equal w/o rales, rhonchi, wheezing or stridor.  Cor: Heart sounds normal w/ regular rate and rhythm without sig. murmurs, gallops, clicks, or rubs. Peripheral pulses normal and equal  without edema.  Abdomen: Soft & bowel sounds normal. Non-tender w/o guarding, rebound, hernias, masses, or organomegaly.  Lymphatics: Unremarkable.  Musculoskeletal: Full ROM all peripheral extremities, joint stability, 5/5 strength, and normal gait.  Skin: Warm, dry without exposed rashes, lesions, ecchymosis apparent.  Neuro: Cranial nerves intact, reflexes equal bilaterally. Sensory-motor testing grossly intact. Tendon reflexes grossly intact.  Pysch: Alert & oriented x 3. Insight and judgement nl & appropriate. No ideations.  Assessment and Plan:  1. Hypertension?ASHD/MI/CABG- Continue monitor blood pressure at home.  Continue diet/meds same.  2. Hyperlipidemia - Continue diet/meds, exercise,& lifestyle modifications. Continue monitor periodic cholesterol/liver & renal functions   3. Pre-diabetes/Insulin Resistance - Continue diet, exercise, lifestyle modifications. Monitor appropriate labs.  4. Vitamin D Deficiency - Continue supplementation.  5. GERD  Recommended regular exercise, BP monitoring, weight control, and discussed med and SE's. Recommended labs to assess and monitor clinical status. Further disposition pending results of labs.

## 2013-12-26 LAB — HEPATIC FUNCTION PANEL
ALT: 13 U/L (ref 0–53)
AST: 14 U/L (ref 0–37)
Albumin: 4.2 g/dL (ref 3.5–5.2)
Alkaline Phosphatase: 111 U/L (ref 39–117)
Bilirubin, Direct: 0.1 mg/dL (ref 0.0–0.3)
Indirect Bilirubin: 0.8 mg/dL (ref 0.0–0.9)
Total Bilirubin: 0.9 mg/dL (ref 0.3–1.2)
Total Protein: 6.8 g/dL (ref 6.0–8.3)

## 2013-12-26 LAB — BASIC METABOLIC PANEL WITH GFR
BUN: 9 mg/dL (ref 6–23)
CO2: 31 mEq/L (ref 19–32)
Calcium: 9.8 mg/dL (ref 8.4–10.5)
Chloride: 95 mEq/L — ABNORMAL LOW (ref 96–112)
Creat: 1 mg/dL (ref 0.50–1.35)
GFR, Est African American: >89 mL/min
GFR, Est Non African American: 81 mL/min
Glucose, Bld: 119 mg/dL — ABNORMAL HIGH (ref 70–99)
Potassium: 5 mEq/L (ref 3.5–5.3)
Sodium: 131 mEq/L — ABNORMAL LOW (ref 135–145)

## 2013-12-26 LAB — LIPID PANEL
Cholesterol: 212 mg/dL — ABNORMAL HIGH (ref 0–200)
HDL: 49 mg/dL (ref >39)
LDL Cholesterol: 120 mg/dL — ABNORMAL HIGH (ref 0–99)
Total CHOL/HDL Ratio: 4.3 Ratio
Triglycerides: 214 mg/dL — ABNORMAL HIGH (ref <150)
VLDL: 43 mg/dL — ABNORMAL HIGH (ref 0–40)

## 2013-12-26 LAB — INSULIN, FASTING: Insulin fasting, serum: 14 u[IU]/mL (ref 3–28)

## 2013-12-26 LAB — MAGNESIUM: Magnesium: 2 mg/dL (ref 1.5–2.5)

## 2013-12-26 LAB — HEMOGLOBIN A1C
Hgb A1c MFr Bld: 6.8 % — ABNORMAL HIGH (ref <5.7)
Mean Plasma Glucose: 148 mg/dL — ABNORMAL HIGH (ref <117)

## 2013-12-26 LAB — VITAMIN D 25 HYDROXY (VIT D DEFICIENCY, FRACTURES): Vit D, 25-Hydroxy: 76 ng/mL (ref 30–89)

## 2013-12-26 LAB — TSH: TSH: 3.114 u[IU]/mL (ref 0.350–4.500)

## 2013-12-28 ENCOUNTER — Other Ambulatory Visit: Payer: Self-pay | Admitting: Internal Medicine

## 2013-12-28 DIAGNOSIS — E782 Mixed hyperlipidemia: Secondary | ICD-10-CM

## 2013-12-28 MED ORDER — ATORVASTATIN CALCIUM 80 MG PO TABS: 80.0000 mg | ORAL_TABLET | Freq: Every day | ORAL | Status: DC

## 2014-02-09 ENCOUNTER — Other Ambulatory Visit: Payer: Self-pay | Admitting: Internal Medicine

## 2014-03-12 ENCOUNTER — Other Ambulatory Visit: Payer: Self-pay | Admitting: Emergency Medicine

## 2014-03-30 ENCOUNTER — Encounter: Payer: Self-pay | Admitting: Internal Medicine

## 2014-03-30 DIAGNOSIS — Z79899 Other long term (current) drug therapy: Secondary | ICD-10-CM | POA: Insufficient documentation

## 2014-03-30 NOTE — Progress Notes (Signed)
Patient ID: Curtis Perez, male   DOB: 05/16/1951, 63 y.o.   MRN: [id number]  Error  

## 2014-03-30 NOTE — Patient Instructions (Addendum)
Error

## 2014-04-12 ENCOUNTER — Other Ambulatory Visit: Payer: Self-pay | Admitting: Physician Assistant

## 2014-06-15 ENCOUNTER — Encounter: Payer: Self-pay | Admitting: Internal Medicine

## 2014-06-15 ENCOUNTER — Ambulatory Visit (INDEPENDENT_AMBULATORY_CARE_PROVIDER_SITE_OTHER): Payer: No Typology Code available for payment source | Admitting: Internal Medicine

## 2014-06-15 VITALS — BP 126/82 | HR 52 | Temp 97.5°F | Resp 16 | Ht 74.5 in | Wt 278.0 lb

## 2014-06-15 DIAGNOSIS — Z0000 Encounter for general adult medical examination without abnormal findings: Secondary | ICD-10-CM

## 2014-06-15 DIAGNOSIS — Z113 Encounter for screening for infections with a predominantly sexual mode of transmission: Secondary | ICD-10-CM

## 2014-06-15 DIAGNOSIS — R7401 Elevation of levels of liver transaminase levels: Secondary | ICD-10-CM

## 2014-06-15 DIAGNOSIS — Z1212 Encounter for screening for malignant neoplasm of rectum: Secondary | ICD-10-CM

## 2014-06-15 DIAGNOSIS — Z111 Encounter for screening for respiratory tuberculosis: Secondary | ICD-10-CM

## 2014-06-15 DIAGNOSIS — F17219 Nicotine dependence, cigarettes, with unspecified nicotine-induced disorders: Secondary | ICD-10-CM

## 2014-06-15 DIAGNOSIS — R740 Nonspecific elevation of levels of transaminase and lactic acid dehydrogenase [LDH]: Secondary | ICD-10-CM

## 2014-06-15 DIAGNOSIS — Z125 Encounter for screening for malignant neoplasm of prostate: Secondary | ICD-10-CM

## 2014-06-15 DIAGNOSIS — I10 Essential (primary) hypertension: Secondary | ICD-10-CM

## 2014-06-15 DIAGNOSIS — R7402 Elevation of levels of lactic acid dehydrogenase (LDH): Secondary | ICD-10-CM

## 2014-06-15 DIAGNOSIS — E559 Vitamin D deficiency, unspecified: Secondary | ICD-10-CM

## 2014-06-15 LAB — CBC WITH DIFFERENTIAL/PLATELET
Basophils Absolute: 0 10*3/uL (ref 0.0–0.1)
Basophils Relative: 0 % (ref 0–1)
Eosinophils Absolute: 0.2 10*3/uL (ref 0.0–0.7)
Eosinophils Relative: 3 % (ref 0–5)
HCT: 42.9 % (ref 39.0–52.0)
Hemoglobin: 15.3 g/dL (ref 13.0–17.0)
Lymphocytes Relative: 22 % (ref 12–46)
Lymphs Abs: 1.3 10*3/uL (ref 0.7–4.0)
MCH: 29.6 pg (ref 26.0–34.0)
MCHC: 35.7 g/dL (ref 30.0–36.0)
MCV: 83 fL (ref 78.0–100.0)
Monocytes Absolute: 0.4 10*3/uL (ref 0.1–1.0)
Monocytes Relative: 7 % (ref 3–12)
Neutro Abs: 4.1 10*3/uL (ref 1.7–7.7)
Neutrophils Relative %: 68 % (ref 43–77)
Platelets: 203 10*3/uL (ref 150–400)
RBC: 5.17 MIL/uL (ref 4.22–5.81)
RDW: 13.8 % (ref 11.5–15.5)
WBC: 6 10*3/uL (ref 4.0–10.5)

## 2014-06-15 LAB — HEMOGLOBIN A1C
Hgb A1c MFr Bld: 7.1 % — ABNORMAL HIGH (ref <5.7)
Mean Plasma Glucose: 157 mg/dL — ABNORMAL HIGH (ref <117)

## 2014-06-15 NOTE — Progress Notes (Signed)
Patient ID: Curtis Perez, male   DOB: 1951-04-12, 63 y.o.   MRN: [id number]   Annual Screening Comprehensive Examination  This very nice 63 y.o.SWM presents for complete physical.  Patient has been followed for HTN, ASCAD s/p CABG, Prediabetes, Hyperlipidemia, and Vitamin D Deficiency.   HTN predates since 2005 when he had PTCA/Stents for ACS. He then did well until Feb 2013 when he underwent a CABG by Dr Cyndia Bent. Patient's BP has been controlled and today's BP: 126/82 mmHg.  Patient does have Stage 2 CKD with GFR 72 ml/min. Patient denies any cardiac symptoms as chest pain, palpitations, shortness of breath, dizziness or ankle swelling.   Patient's hyperlipidemia is controlled with diet and medications. Patient denies myalgias or other medication SE's. Last cholesterol last visit was near goal as below. Lab Results  Component Value Date   CHOL 212* 12/25/2013   HDL 49 12/25/2013   LDLCALC 120* 12/25/2013   TRIG 214* 12/25/2013   CHOLHDL 4.3 12/25/2013     Patient has  Does have Morbid Obesity with consequent prediabetes since  Mar 2011 with A1c 6.2%  and last A1c was  As below. Patient denies reactive hypoglycemic Sx's, visual blurring, diabetic polys or paresthesias.  Lab Results  Component Value Date   HGBA1C 6.8* 12/25/2013      Finally, patient has history of Vitamin D Deficiency of  6 in 2006 and last vitamin D was 76 in Jan 2015.   Medication Sig  . aspirin 325 MG tablet Take 325 mg by mouth daily.  Marland Kitchen atenolol  50 MG tablet Take 50 mg by mouth daily.  Marland Kitchen atorvastatin  80 MG tablet Take 1/2  tablet  by mouth daily  . VITAMIN B-12  Take 1,000 mcg by mouth daily.   Marland Kitchen LEVSIN SL 0.125 MG SL  Place 1 tablet  SL every 4  hours as needed   . lisinopril  10 MG tablet TAKE 1 TABLET BY MOUTH EVERY DAY  . Omega-3 Fatty Acids (FISH OIL PO) Take 2,000 mg by mouth daily.  . Vitamin D 50000 UNITS CAPS  TAKE ONE CAPSULE BY MOUTH EVERY DAY   No Known Allergies  Past Medical History  Diagnosis Date  .  Hypertension   . Hyperlipidemia   . Bradycardia   . Myocardial infarction     2004  . Tobacco abuse 03/28/2012  . 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 CAD (coronary artery disease)     remote MI in 2004 with PCI; s/p CABG x 3 03/2012  . Adenomatous colon polyp 02/15/2009  . Family history of malignant neoplasm of gastrointestinal tract   . Prediabetes   . GERD (gastroesophageal reflux disease)   . Vitamin D deficiency   . Obesity    Past Surgical History  Procedure Laterality Date  . Tonsillectomy    . Knee surgery    . Back surgery    . Coronary angioplasty with stent placement      about 8 years ago at [facility]  . Colonoscopy  02/15/2009  . Coronary artery bypass graft  04/02/2012    Procedure: CORONARY ARTERY BYPASS GRAFTING (CABG);  Surgeon: Rexene Alberts, MD;  Location: [place];  Service: Open Heart Surgery;  Laterality: N/A;  On pump, times three graphs using endoscopically harvested right greater saphenous vein and left internal mammary artery.    Family History  Problem  Relation Age of Onset  . Colon cancer Father   . Hypertension Mother   . Alzheimer's disease Mother   . Colon cancer Paternal Grandmother    History   Social History  . Marital Status: Divorced    Spouse Name: N/A    Number of Children: 0  . Years of Education: N/A   Occupational History  . Disability    Social History Main Topics  . Smoking status: Current Every Day Smoker -- 1.50 packs/day for 40 years    Types: Cigarettes  . Smokeless tobacco: Never Used     Comment: down from 2ppd  . Alcohol Use: Yes     Comment: Rarely  . Drug Use: No  . Sexual Activity: Not Currently    ROS Constitutional: Denies fever, chills, weight loss/gain, headaches, insomnia, fatigue, night sweats or change in appetite. Eyes: Denies redness, blurred vision, diplopia, discharge, itchy or watery eyes.  ENT: Denies discharge, congestion, post nasal  drip, epistaxis, sore throat, earache, hearing loss, dental pain, Tinnitus, Vertigo, Sinus pain or snoring.  Cardio: Denies chest pain, palpitations, irregular heartbeat, syncope, dyspnea, diaphoresis, orthopnea, PND, claudication or edema Respiratory: denies cough, dyspnea, DOE, pleurisy, hoarseness, laryngitis or wheezing.  Gastrointestinal: Denies dysphagia, heartburn, reflux, water brash, pain, cramps, nausea, vomiting, bloating, diarrhea, constipation, hematemesis, melena, hematochezia, jaundice or hemorrhoids Genitourinary: Denies dysuria, frequency, urgency, nocturia, hesitancy, discharge, hematuria or flank pain Musculoskeletal: Denies arthralgia, myalgia, stiffness, Jt. Swelling, pain, limp or strain/sprain. Skin: Denies puritis, rash, hives, warts, acne, eczema or change in skin lesion Neuro: No weakness, tremor, incoordination, spasms, paresthesia or pain Psychiatric: Denies confusion, memory loss or sensory loss Endocrine: Denies change in weight, skin, hair change, nocturia, and paresthesia, diabetic polys, visual blurring or hyper / hypo glycemic episodes.  Heme/Lymph: No excessive bleeding, bruising or enlarged lymph nodes.  Physical Exam  BP 126/82  P 52  T 97.5 F   R 16  Ht 6' 2.5"   Wt 278 lb BMI 35.23 kg/m2  General Appearance: Over nourished and  in no apparent distress. Eyes: PERRLA, EOMs, conjunctiva no swelling or erythema, normal fundi and vessels. Sinuses: No frontal/maxillary tenderness ENT/Mouth: EACs patent / TMs  nl. Nares clear without erythema, swelling, mucoid exudates. Oral hygiene is good. No erythema, swelling, or exudate. Tongue normal, non-obstructing. Tonsils not swollen or erythematous. Hearing normal.  Neck: Supple, thyroid normal. No bruits, nodes or JVD. Respiratory: Respiratory effort normal.  BS equal and clear bilateral without rales, rhonci, wheezing or stridor. Cardio: Heart sounds are normal with regular rate and rhythm and no murmurs, rubs  or gallops. Peripheral pulses are normal and equal bilaterally without edema. No aortic or femoral bruits. Chest: Median Sternotomy scar. Symmetric with normal excursions and percussion.  Abdomen: Flat, soft, with bowl sounds. Nontender, no guarding, rebound, hernias, masses, or organomegaly.  Lymphatics: Non tender without lymphadenopathy.  Genitourinary: No hernias.Testes nl. DRE - prostate nl for age - smooth & firm w/o nodules. Musculoskeletal: Full ROM all peripheral extremities, joint stability, 5/5 strength, and normal gait. Skin: Warm and dry without rashes, lesions, cyanosis, clubbing or  ecchymosis.  Neuro: Cranial nerves intact, reflexes equal bilaterally. Normal muscle tone, no cerebellar symptoms. Sensation intact.  Pysch: Awake and oriented X 3with normal affect, insight and judgment appropriate.   Assessment and Plan  1. Annual Screening Examination 2. Hypertension  3. ASCAD s/p CABG 4. Hyperlipidemia 5. Pre Diabetes 6. Vitamin D Deficiency 7. Morbid Obesity  Continue prudent diet as discussed, weight control, BP monitoring, regular  exercise, and medications as discussed.  Discussed med effects and SE's. Routine screening labs and tests as requested with regular follow-up as recommended. Discouraged smoking.

## 2014-06-15 NOTE — Patient Instructions (Signed)

## 2014-06-16 LAB — MICROALBUMIN / CREATININE URINE RATIO
Creatinine, Urine: 102.3 mg/dL
Microalb Creat Ratio: 4.9 mg/g (ref 0.0–30.0)
Microalb, Ur: 0.5 mg/dL (ref 0.00–1.89)

## 2014-06-16 LAB — LIPID PANEL
Cholesterol: 108 mg/dL (ref 0–200)
HDL: 48 mg/dL (ref >39)
LDL Cholesterol: 49 mg/dL (ref 0–99)
Total CHOL/HDL Ratio: 2.3 Ratio
Triglycerides: 56 mg/dL (ref <150)
VLDL: 11 mg/dL (ref 0–40)

## 2014-06-16 LAB — TESTOSTERONE: Testosterone: 590 ng/dL (ref 300–890)

## 2014-06-16 LAB — TSH: TSH: 2.732 u[IU]/mL (ref 0.350–4.500)

## 2014-06-16 LAB — PSA: PSA: 0.84 ng/mL (ref <=4.00)

## 2014-06-16 LAB — BASIC METABOLIC PANEL WITH GFR
BUN: 14 mg/dL (ref 6–23)
CO2: 27 mEq/L (ref 19–32)
Calcium: 9 mg/dL (ref 8.4–10.5)
Chloride: 99 mEq/L (ref 96–112)
Creat: 0.95 mg/dL (ref 0.50–1.35)
GFR, Est African American: >89 mL/min
GFR, Est Non African American: 87 mL/min
Glucose, Bld: 116 mg/dL — ABNORMAL HIGH (ref 70–99)
Potassium: 4.5 mEq/L (ref 3.5–5.3)
Sodium: 135 mEq/L (ref 135–145)

## 2014-06-16 LAB — HEPATIC FUNCTION PANEL
ALT: 14 U/L (ref 0–53)
AST: 14 U/L (ref 0–37)
Albumin: 3.8 g/dL (ref 3.5–5.2)
Alkaline Phosphatase: 99 U/L (ref 39–117)
Bilirubin, Direct: 0.2 mg/dL (ref 0.0–0.3)
Indirect Bilirubin: 0.6 mg/dL (ref 0.2–1.2)
Total Bilirubin: 0.8 mg/dL (ref 0.2–1.2)
Total Protein: 6.2 g/dL (ref 6.0–8.3)

## 2014-06-16 LAB — INSULIN, FASTING: Insulin fasting, serum: 22 u[IU]/mL (ref 3–28)

## 2014-06-16 LAB — URINALYSIS, MICROSCOPIC ONLY
Bacteria, UA: NONE SEEN
Casts: NONE SEEN
Crystals: NONE SEEN
Squamous Epithelial / LPF: NONE SEEN

## 2014-06-16 LAB — HEPATITIS A ANTIBODY, TOTAL: Hep A Total Ab: NONREACTIVE

## 2014-06-16 LAB — HEPATITIS C ANTIBODY: HCV Ab: NEGATIVE

## 2014-06-16 LAB — MAGNESIUM: Magnesium: 1.8 mg/dL (ref 1.5–2.5)

## 2014-06-16 LAB — HEPATITIS B SURFACE ANTIBODY,QUALITATIVE: Hep B S Ab: NEGATIVE

## 2014-06-16 LAB — VITAMIN D 25 HYDROXY (VIT D DEFICIENCY, FRACTURES): Vit D, 25-Hydroxy: 77 ng/mL (ref 30–89)

## 2014-06-16 LAB — HIV ANTIBODY (ROUTINE TESTING W REFLEX): HIV 1&2 Ab, 4th Generation: NONREACTIVE

## 2014-06-16 LAB — RPR

## 2014-06-16 LAB — URIC ACID: Uric Acid, Serum: 5.1 mg/dL (ref 4.0–7.8)

## 2014-06-16 LAB — HEPATITIS B CORE ANTIBODY, TOTAL: Hep B Core Total Ab: NONREACTIVE

## 2014-06-16 LAB — VITAMIN B12: Vitamin B-12: 969 pg/mL — ABNORMAL HIGH (ref 211–911)

## 2014-06-17 LAB — HEPATITIS B E ANTIBODY: Hepatitis Be Antibody: NONREACTIVE

## 2014-06-18 LAB — TB SKIN TEST
Induration: 0 mm
TB Skin Test: NEGATIVE

## 2014-06-20 ENCOUNTER — Other Ambulatory Visit: Payer: Self-pay | Admitting: Internal Medicine

## 2014-06-20 MED ORDER — METFORMIN HCL ER 500 MG PO TB24: ORAL_TABLET | ORAL | Status: DC

## 2014-07-16 ENCOUNTER — Other Ambulatory Visit: Payer: Self-pay | Admitting: Internal Medicine

## 2014-07-18 ENCOUNTER — Other Ambulatory Visit: Payer: Self-pay | Admitting: Physician Assistant

## 2014-07-28 ENCOUNTER — Other Ambulatory Visit: Payer: Self-pay | Admitting: Internal Medicine

## 2014-08-08 ENCOUNTER — Emergency Department (HOSPITAL_COMMUNITY): Payer: No Typology Code available for payment source

## 2014-08-08 ENCOUNTER — Encounter (HOSPITAL_COMMUNITY): Payer: Self-pay | Admitting: Emergency Medicine

## 2014-08-08 ENCOUNTER — Inpatient Hospital Stay (HOSPITAL_COMMUNITY)
Admission: EM | Admit: 2014-08-08 | Discharge: 2014-08-10 | DRG: 194 | Disposition: A | Discharge status: Home / Self Care | Payer: No Typology Code available for payment source | Attending: Internal Medicine | Admitting: Internal Medicine

## 2014-08-08 DIAGNOSIS — Z6834 Body mass index (BMI) 34.0-34.9, adult: Secondary | ICD-10-CM | POA: Diagnosis not present

## 2014-08-08 DIAGNOSIS — I2510 Atherosclerotic heart disease of native coronary artery without angina pectoris: Secondary | ICD-10-CM | POA: Diagnosis present

## 2014-08-08 DIAGNOSIS — E669 Obesity, unspecified: Secondary | ICD-10-CM | POA: Diagnosis present

## 2014-08-08 DIAGNOSIS — Z720 Tobacco use: Secondary | ICD-10-CM

## 2014-08-08 DIAGNOSIS — I252 Old myocardial infarction: Secondary | ICD-10-CM

## 2014-08-08 DIAGNOSIS — J189 Pneumonia, unspecified organism: Principal | ICD-10-CM | POA: Diagnosis present

## 2014-08-08 DIAGNOSIS — E785 Hyperlipidemia, unspecified: Secondary | ICD-10-CM | POA: Diagnosis present

## 2014-08-08 DIAGNOSIS — Z79899 Other long term (current) drug therapy: Secondary | ICD-10-CM

## 2014-08-08 DIAGNOSIS — D62 Acute posthemorrhagic anemia: Secondary | ICD-10-CM | POA: Diagnosis not present

## 2014-08-08 DIAGNOSIS — E1165 Type 2 diabetes mellitus with hyperglycemia: Secondary | ICD-10-CM

## 2014-08-08 DIAGNOSIS — Z23 Encounter for immunization: Secondary | ICD-10-CM | POA: Diagnosis not present

## 2014-08-08 DIAGNOSIS — E871 Hypo-osmolality and hyponatremia: Secondary | ICD-10-CM

## 2014-08-08 DIAGNOSIS — F17200 Nicotine dependence, unspecified, uncomplicated: Secondary | ICD-10-CM | POA: Diagnosis present

## 2014-08-08 DIAGNOSIS — E559 Vitamin D deficiency, unspecified: Secondary | ICD-10-CM | POA: Diagnosis present

## 2014-08-08 DIAGNOSIS — Z7982 Long term (current) use of aspirin: Secondary | ICD-10-CM | POA: Diagnosis not present

## 2014-08-08 DIAGNOSIS — I10 Essential (primary) hypertension: Secondary | ICD-10-CM | POA: Diagnosis present

## 2014-08-08 DIAGNOSIS — Z951 Presence of aortocoronary bypass graft: Secondary | ICD-10-CM

## 2014-08-08 DIAGNOSIS — K219 Gastro-esophageal reflux disease without esophagitis: Secondary | ICD-10-CM | POA: Diagnosis present

## 2014-08-08 DIAGNOSIS — S301XXD Contusion of abdominal wall, subsequent encounter: Secondary | ICD-10-CM

## 2014-08-08 DIAGNOSIS — S301XXA Contusion of abdominal wall, initial encounter: Secondary | ICD-10-CM | POA: Diagnosis present

## 2014-08-08 DIAGNOSIS — M7981 Nontraumatic hematoma of soft tissue: Secondary | ICD-10-CM

## 2014-08-08 DIAGNOSIS — R1011 Right upper quadrant pain: Secondary | ICD-10-CM | POA: Diagnosis not present

## 2014-08-08 LAB — COMPREHENSIVE METABOLIC PANEL
ALT: 11 U/L (ref 0–53)
AST: 13 U/L (ref 0–37)
Albumin: 3.2 g/dL — ABNORMAL LOW (ref 3.5–5.2)
Alkaline Phosphatase: 101 U/L (ref 39–117)
Anion gap: 15 (ref 5–15)
BUN: 8 mg/dL (ref 6–23)
CO2: 22 mEq/L (ref 19–32)
Calcium: 8.9 mg/dL (ref 8.4–10.5)
Chloride: 93 mEq/L — ABNORMAL LOW (ref 96–112)
Creatinine, Ser: 0.93 mg/dL (ref 0.50–1.35)
GFR calc Af Amer: >90 mL/min (ref >90)
GFR calc non Af Amer: 89 mL/min — ABNORMAL LOW (ref >90)
Glucose, Bld: 184 mg/dL — ABNORMAL HIGH (ref 70–99)
Potassium: 4.3 mEq/L (ref 3.7–5.3)
Sodium: 130 mEq/L — ABNORMAL LOW (ref 137–147)
Total Bilirubin: 0.6 mg/dL (ref 0.3–1.2)
Total Protein: 6 g/dL (ref 6.0–8.3)

## 2014-08-08 LAB — CBC WITH DIFFERENTIAL/PLATELET
Basophils Absolute: 0 10*3/uL (ref 0.0–0.1)
Basophils Relative: 0 % (ref 0–1)
Eosinophils Absolute: 0.1 10*3/uL (ref 0.0–0.7)
Eosinophils Relative: 1 % (ref 0–5)
HCT: 37.1 % — ABNORMAL LOW (ref 39.0–52.0)
Hemoglobin: 13.2 g/dL (ref 13.0–17.0)
Lymphocytes Relative: 7 % — ABNORMAL LOW (ref 12–46)
Lymphs Abs: 0.7 10*3/uL (ref 0.7–4.0)
MCH: 30 pg (ref 26.0–34.0)
MCHC: 35.6 g/dL (ref 30.0–36.0)
MCV: 84.3 fL (ref 78.0–100.0)
Monocytes Absolute: 0.7 10*3/uL (ref 0.1–1.0)
Monocytes Relative: 6 % (ref 3–12)
Neutro Abs: 8.7 10*3/uL — ABNORMAL HIGH (ref 1.7–7.7)
Neutrophils Relative %: 86 % — ABNORMAL HIGH (ref 43–77)
Platelets: 208 10*3/uL (ref 150–400)
RBC: 4.4 MIL/uL (ref 4.22–5.81)
RDW: 12.7 % (ref 11.5–15.5)
WBC: 10.1 10*3/uL (ref 4.0–10.5)

## 2014-08-08 LAB — HIV ANTIBODY (ROUTINE TESTING W REFLEX): HIV 1&2 Ab, 4th Generation: NONREACTIVE

## 2014-08-08 LAB — CBC
HCT: 31.7 % — ABNORMAL LOW (ref 39.0–52.0)
Hemoglobin: 11.3 g/dL — ABNORMAL LOW (ref 13.0–17.0)
MCH: 29.5 pg (ref 26.0–34.0)
MCHC: 35.6 g/dL (ref 30.0–36.0)
MCV: 82.8 fL (ref 78.0–100.0)
Platelets: 219 10*3/uL (ref 150–400)
RBC: 3.83 MIL/uL — ABNORMAL LOW (ref 4.22–5.81)
RDW: 12.9 % (ref 11.5–15.5)
WBC: 10.2 10*3/uL (ref 4.0–10.5)

## 2014-08-08 LAB — STREP PNEUMONIAE URINARY ANTIGEN: Strep Pneumo Urinary Antigen: NEGATIVE

## 2014-08-08 LAB — I-STAT TROPONIN, ED: Troponin i, poc: 0.01 ng/mL (ref 0.00–0.08)

## 2014-08-08 LAB — I-STAT CHEM 8, ED
BUN: 7 mg/dL (ref 6–23)
Calcium, Ion: 1.14 mmol/L (ref 1.13–1.30)
Chloride: 94 mEq/L — ABNORMAL LOW (ref 96–112)
Creatinine, Ser: 1 mg/dL (ref 0.50–1.35)
Glucose, Bld: 192 mg/dL — ABNORMAL HIGH (ref 70–99)
HCT: 39 % (ref 39.0–52.0)
Hemoglobin: 13.3 g/dL (ref 13.0–17.0)
Potassium: 4.1 mEq/L (ref 3.7–5.3)
Sodium: 128 mEq/L — ABNORMAL LOW (ref 137–147)
TCO2: 24 mmol/L (ref 0–100)

## 2014-08-08 LAB — BASIC METABOLIC PANEL
Anion gap: 13 (ref 5–15)
BUN: 7 mg/dL (ref 6–23)
CO2: 22 mEq/L (ref 19–32)
Calcium: 8.8 mg/dL (ref 8.4–10.5)
Chloride: 88 mEq/L — ABNORMAL LOW (ref 96–112)
Creatinine, Ser: 0.78 mg/dL (ref 0.50–1.35)
GFR calc Af Amer: >90 mL/min (ref >90)
GFR calc non Af Amer: >90 mL/min (ref >90)
Glucose, Bld: 130 mg/dL — ABNORMAL HIGH (ref 70–99)
Potassium: 4.8 mEq/L (ref 3.7–5.3)
Sodium: 123 mEq/L — ABNORMAL LOW (ref 137–147)

## 2014-08-08 LAB — GLUCOSE, CAPILLARY
Glucose-Capillary: 206 mg/dL — ABNORMAL HIGH (ref 70–99)
Glucose-Capillary: 113 mg/dL — ABNORMAL HIGH (ref 70–99)
Glucose-Capillary: 134 mg/dL — ABNORMAL HIGH (ref 70–99)

## 2014-08-08 LAB — OSMOLALITY: Osmolality: 260 mOsm/kg — ABNORMAL LOW (ref 275–300)

## 2014-08-08 LAB — HEMOGLOBIN AND HEMATOCRIT, BLOOD
HCT: 30.4 % — ABNORMAL LOW (ref 39.0–52.0)
Hemoglobin: 10.9 g/dL — ABNORMAL LOW (ref 13.0–17.0)

## 2014-08-08 LAB — TYPE AND SCREEN
ABO/RH(D): A POS
Antibody Screen: NEGATIVE

## 2014-08-08 LAB — I-STAT CG4 LACTIC ACID, ED: Lactic Acid, Venous: 2.12 mmol/L (ref 0.5–2.2)

## 2014-08-08 MED ORDER — INSULIN ASPART 100 UNIT/ML ~~LOC~~ SOLN
0.0000 [IU] | Freq: Three times a day (TID) | SUBCUTANEOUS | Status: DC
  Administered 2014-08-08: 3 [IU] via SUBCUTANEOUS
  Administered 2014-08-08 – 2014-08-10 (×2): 1 [IU] via SUBCUTANEOUS

## 2014-08-08 MED ORDER — AZITHROMYCIN 500 MG IV SOLR
500.0000 mg | Freq: Once | INTRAVENOUS | Status: AC
  Administered 2014-08-08: 500 mg via INTRAVENOUS
  Filled 2014-08-08: qty 500

## 2014-08-08 MED ORDER — ATORVASTATIN CALCIUM 40 MG PO TABS
40.0000 mg | ORAL_TABLET | Freq: Every day | ORAL | Status: DC
  Filled 2014-08-08 (×2): qty 1

## 2014-08-08 MED ORDER — IOHEXOL 350 MG/ML SOLN
100.0000 mL | Freq: Once | INTRAVENOUS | Status: AC | PRN
  Administered 2014-08-08: 100 mL via INTRAVENOUS

## 2014-08-08 MED ORDER — DEXTROSE 5 % IV SOLN
1.0000 g | Freq: Once | INTRAVENOUS | Status: AC
  Administered 2014-08-08: 1 g via INTRAVENOUS
  Filled 2014-08-08: qty 10

## 2014-08-08 MED ORDER — DEXTROSE 5 % IV SOLN
500.0000 mg | INTRAVENOUS | Status: DC
  Administered 2014-08-09 – 2014-08-10 (×2): 500 mg via INTRAVENOUS
  Filled 2014-08-08 (×2): qty 500

## 2014-08-08 MED ORDER — ZOLPIDEM TARTRATE 5 MG PO TABS
5.0000 mg | ORAL_TABLET | Freq: Every evening | ORAL | Status: DC | PRN
  Administered 2014-08-08 – 2014-08-09 (×2): 5 mg via ORAL
  Filled 2014-08-08 (×2): qty 1

## 2014-08-08 MED ORDER — PANTOPRAZOLE SODIUM 40 MG PO TBEC
80.0000 mg | DELAYED_RELEASE_TABLET | Freq: Every day | ORAL | Status: DC
  Administered 2014-08-08 – 2014-08-10 (×3): 80 mg via ORAL
  Filled 2014-08-08 (×3): qty 2

## 2014-08-08 MED ORDER — ATENOLOL 50 MG PO TABS
50.0000 mg | ORAL_TABLET | Freq: Every day | ORAL | Status: DC
  Administered 2014-08-08 – 2014-08-10 (×3): 50 mg via ORAL
  Filled 2014-08-08 (×3): qty 1

## 2014-08-08 MED ORDER — OXYCODONE HCL 5 MG PO TABS
5.0000 mg | ORAL_TABLET | ORAL | Status: DC | PRN
  Administered 2014-08-08 (×3): 5 mg via ORAL
  Filled 2014-08-08 (×3): qty 1

## 2014-08-08 MED ORDER — BENZONATATE 100 MG PO CAPS
200.0000 mg | ORAL_CAPSULE | Freq: Three times a day (TID) | ORAL | Status: DC | PRN
  Filled 2014-08-08: qty 2

## 2014-08-08 MED ORDER — ACETAMINOPHEN 325 MG PO TABS
650.0000 mg | ORAL_TABLET | Freq: Four times a day (QID) | ORAL | Status: DC | PRN
  Administered 2014-08-10: 650 mg via ORAL
  Filled 2014-08-08: qty 2

## 2014-08-08 MED ORDER — INSULIN ASPART 100 UNIT/ML ~~LOC~~ SOLN: 0.0000 [IU] | Freq: Every day | SUBCUTANEOUS | Status: DC

## 2014-08-08 MED ORDER — METFORMIN HCL ER 500 MG PO TB24: 500.0000 mg | ORAL_TABLET | Freq: Three times a day (TID) | ORAL | Status: DC

## 2014-08-08 MED ORDER — SODIUM CHLORIDE 0.9 % IV SOLN
INTRAVENOUS | Status: DC
  Administered 2014-08-08: 20:00:00 via INTRAVENOUS
  Administered 2014-08-09: 75 mL/h via INTRAVENOUS
  Administered 2014-08-09: via INTRAVENOUS

## 2014-08-08 MED ORDER — FENTANYL CITRATE 0.05 MG/ML IJ SOLN
50.0000 ug | Freq: Once | INTRAMUSCULAR | Status: AC
  Administered 2014-08-08: 50 ug via INTRAVENOUS
  Filled 2014-08-08: qty 2

## 2014-08-08 MED ORDER — MORPHINE SULFATE 2 MG/ML IJ SOLN
2.0000 mg | INTRAMUSCULAR | Status: DC | PRN
  Administered 2014-08-08: 4 mg via INTRAVENOUS
  Filled 2014-08-08 (×2): qty 2

## 2014-08-08 MED ORDER — DEXTROSE 5 % IV SOLN
1.0000 g | INTRAVENOUS | Status: DC
  Administered 2014-08-09 – 2014-08-10 (×2): 1 g via INTRAVENOUS
  Filled 2014-08-08 (×2): qty 10

## 2014-08-08 MED ORDER — MORPHINE SULFATE 4 MG/ML IJ SOLN
4.0000 mg | Freq: Once | INTRAMUSCULAR | Status: AC
  Administered 2014-08-08: 4 mg via INTRAVENOUS
  Filled 2014-08-08: qty 1

## 2014-08-08 MED ORDER — EZETIMIBE 10 MG PO TABS
10.0000 mg | ORAL_TABLET | Freq: Every day | ORAL | Status: DC
  Administered 2014-08-08 – 2014-08-10 (×3): 10 mg via ORAL
  Filled 2014-08-08 (×3): qty 1

## 2014-08-08 MED ORDER — LISINOPRIL 10 MG PO TABS
10.0000 mg | ORAL_TABLET | Freq: Every day | ORAL | Status: DC
  Administered 2014-08-08 – 2014-08-10 (×3): 10 mg via ORAL
  Filled 2014-08-08 (×3): qty 1

## 2014-08-08 NOTE — Progress Notes (Signed)
CrCl >100 ml/min.  Antibiotic dosing OK.  Pharmacy will sign off.  Please advise if we can be of further assistance.  Thank you,Cyndia Degraff

## 2014-08-08 NOTE — ED Notes (Addendum)
Presents with 2 weeks of cough- tonight began having swelling and bruising to right upper abdomen. Pain is worse with cough-reports nausea. Denies injury. Bilateral breath sounds clear denies SOB.  Pt is pale and diaphoretic

## 2014-08-08 NOTE — Progress Notes (Signed)
PROGRESS NOTE    Curtis Perez:[id number] DOB: [birth year] DOA: 08/08/2014 PCP: Alesia Richards, MD  HPI/Brief narrative 63 year old male with history of hypertension, hyperlipidemia, CAD, CABG, GERD, ongoing nonproductive cough without fever or chills for the last 2-4 weeks, presented to the ED with severe RUQ abdominal pain and swelling, following a bout of coughing. Denied fever or chills. In the ED, found to have a rectus sheath hematoma.   Assessment/Plan:  1. Right Rectus sheath hematoma: Secondary to coughing spells. Only on aspirin at home. General surgery evaluated and recommend supportive treatment. Monitor CBCs closely. Improved. 2. RLL Community acquired pneumonia: Continue IV Rocephin and azithromycin. Consider followup CT in 4-6 weeks to follow PNA and Lung nodule. 3. Acute post hemorrhagic anemia: Hemoglobin dropped from 13.2 > 10.9. Secondary to problem #1. Follow CBCs closely and transfuse if hemoglobin less than 8 g per DL. 4. Hypertension: Controlled. Continue lisinopril and atenolol. 5. History of hyperlipidemia: Continue statins. 6. History of prediabetes: Hold metformin since he received IV contrast. Place on sliding scale insulin. Check hemoglobin A1c. 7. Hyponatremia: Chemically euvolemic.? SIADH. Follow BMP. 8. 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 Reviewed case and imaging with vascular surgeon on call- recommended no further w/u or fu. 9. Right hilar LN: seen on CT. Consider OP follow up imaging   Code Status: Full  Family Communication: None at bedside Disposition Plan: Home when medically stable.   Consultants:  General surgery  Procedures:  None  Antibiotics:  IV Rocephin 8/22 >  IV azithromycin 8/22 >   Subjective: RUQ abdominal pain and swelling better than on admission. Mild intermittent dry cough. No dyspnea.  Objective: Filed Vitals:   08/08/14 0530 08/08/14 0600 08/08/14 0644 08/08/14 0933  BP: 146/68 146/66 125/75 136/68  Pulse: 60 61 67 61  Temp:  97.8 F (36.6 C) 98.1 F (36.7 C) 98 F (36.7 C)  TempSrc:  Oral Oral Oral  Resp: 24 22 20 20   Height:   6\' 3"  (1.905 m)   Weight:   123.923 kg (273 lb 3.2 oz)   SpO2: 95% 96% 97% 97%    Intake/Output Summary (Last 24 hours) at 08/08/14 1440 Last data filed at 08/08/14 1400  Gross per 24 hour  Intake    840 ml  Output      0 ml  Net    840 ml   Filed Weights   08/08/14 0644  Weight: 123.923 kg (273 lb 3.2 oz)     Exam:  General exam: Pleasant middle-aged male lying comfortably in bed. Respiratory system: Occasional basal crackles but otherwise clear to auscultation. No increased work of breathing. Cardiovascular system: S1 & S2 heard, RRR. No JVD, murmurs, gallops, clicks or pedal edema. Telemetry: Sinus rhythm with occasional PVCs. Gastrointestinal system: Firm tender area over RUQ without fluctuance or bruit. No peritoneal signs. Normal bowel sounds heard. Central nervous system: Alert and oriented. No focal neurological deficits. Extremities: Symmetric 5 x 5 power.   Data Reviewed: Basic Metabolic Panel:  Recent Labs Lab 08/08/14 0304 08/08/14 0308  NA 130* 128*  K 4.3 4.1  CL 93* 94*  CO2 22  --   GLUCOSE 184* 192*  BUN 8 7  CREATININE 0.93 1.00  CALCIUM 8.9  --    Liver Function Tests:  Recent Labs Lab 08/08/14 0304  AST 13  ALT 11  ALKPHOS 101  BILITOT 0.6  PROT 6.0  ALBUMIN 3.2*  No results found for this basename: LIPASE, AMYLASE,  in the last 168 hours No results found for this basename: AMMONIA,  in the last 168 hours CBC:  Recent Labs Lab 08/08/14 0304 08/08/14 0308 08/08/14 1225  WBC 10.1  --   --   NEUTROABS 8.7*  --   --   HGB 13.2 13.3 10.9*  HCT 37.1* 39.0 30.4*  MCV 84.3  --   --   PLT 208  --   --    Cardiac Enzymes: No results found for this basename: CKTOTAL, CKMB, CKMBINDEX, TROPONINI,  in the last  168 hours BNP (last 3 results) No results found for this basename: PROBNP,  in the last 8760 hours CBG:  Recent Labs Lab 08/08/14 0759 08/08/14 1155  GLUCAP 206* 113*    No results found for this or any previous visit (from the past 240 hour(s)).        Studies: Ct Angio Chest W/cm &/or Wo Cm  08/08/2014   CLINICAL DATA:  BLEEDING/BRUISING ABDOMINAL PAIN  EXAM: CT ANGIOGRAPHY CHEST WITH CONTRAST  TECHNIQUE: Multidetector CT imaging of the chest was performed using the standard protocol during bolus administration of intravenous contrast. Multiplanar CT image reconstructions and MIPs were obtained to evaluate the vascular anatomy.  CONTRAST:  157mL OMNIPAQUE IOHEXOL 350 MG/ML SOLN  COMPARISON:  Prior radiograph earlier the same day  FINDINGS: The thyroid gland is within normal limits.  No pathologically enlarged mediastinal hilar adenopathy. There is a 1.4 cm right hilar lymph node, indeterminate. No axillary adenopathy.  Intrathoracic aorta is of normal caliber without evidence of aneurysm. No dissection or other acute abnormality seen. In the abdomen, a small linear filling defect is seen within the left lateral aspect of the intra-abdominal aorta at the level of the SMA, suggestive of a small dissection flap (series 5, image 117). This is best seen on sagittal sequence (series 9, image 104). This is likely chronic in nature. Both lumens are well opacified, and there are no acute inflammatory changes in this region. The celiac axis and SMA are well opacified. There are 2 left renal arteries with single right renal artery, all of which are opacified.  Sequelae of prior median sternotomy noted. Heart size is within normal limits. No pericardial effusion.  Main pulmonary artery within normal limits for diameter. No definite abnormality seen within the pulmonary arteries. Please note that this study is not optimized for evaluation for pulmonary embolism.  Scattered nodular and patchy opacity or  present within the posterior right lower lobe, which may reflect infiltrate or possibly sequelae of aspiration. Mild bronchiectatic changes seen within this region. Lungs are otherwise clear without pulmonary edema or pleural effusion. No other focal infiltrates.  Visualized liver within normal limits. Gallbladder is contracted but otherwise unremarkable. The spleen, adrenal glands, and pancreas are within normal limits.  Stomach is unremarkable.  Visualized kidneys are within normal limits.  A right-sided rectus sheath hematoma measuring 17.8 x 6.3 cm partially visualized within the anterior right abdomen (series 5, image 114). There is are a few small blushes of active contrast extravasation in the body of the hematoma, possibly from the right superior epigastric artery (series 5, image 110, 111).  IMPRESSION: 1. 17.8 x 6.3 cm hematoma within the right rectus abdominus sheath, incompletely visualized. There is a small blush of active contrast extravasation within this hematoma, likely arising from the right superior epigastric artery (or its branches). Follow-up examination with dedicated imaging of the remaining abdomen and pelvis to completely  characterize this hematoma could be considered. 2. 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 This is favored to be chronic in nature, and may be related to a chronic penetrating atherosclerotic ulcer. No contrast extravasation or associated inflammation. No other acute abnormality identified within the visualized thoracic and abdominal aorta. 3. Focal patchy and nodular opacities within the posterior right lower lobe, which may reflect focal pneumonia or possibly sequelae of aspiration. 4. 1.4 cm right hilar lymph node, indeterminate, but may be reactive in nature. Critical Value/emergent results were called by telephone at the time of interpretation on 08/08/2014 at 4:55 am to Dr. Elyn Peers , who  verbally acknowledged these results.   Electronically Signed   By: Jeannine Boga M.D.   On: 08/08/2014 04:54   Dg Chest Portable 1 View  08/08/2014   CLINICAL DATA:  Bleeding and bruising.  Abdominal pain  EXAM: PORTABLE CHEST - 1 VIEW  COMPARISON:  04/29/2012  FINDINGS: No cardiomegaly. Negative upper mediastinal contours. Patient is status post CABG.  There is no edema, consolidation, effusion, or pneumothorax.  IMPRESSION: No active disease.   Electronically Signed   By: Jorje Guild M.D.   On: 08/08/2014 03:22   Dg Abd Portable 1v  08/08/2014   CLINICAL DATA:  Abdominal pain  EXAM: PORTABLE ABDOMEN - 1 VIEW  COMPARISON:  12/14/2003  FINDINGS: The upper abdomen is excluded from view. The majority of the bowel gas is visualized however (excluding the stomach) and is nonobstructive. There is no concerning intra-abdominal mass effect or calcification. Lower lumbar facet osteoarthritis.  IMPRESSION: Nonobstructive bowel gas pattern.   Electronically Signed   By: Jorje Guild M.D.   On: 08/08/2014 03:15        Scheduled Meds: . atenolol  50 mg Oral Daily  . atorvastatin  40 mg Oral Daily  . [START ON 08/09/2014] azithromycin  500 mg Intravenous Q24H  . [START ON 08/09/2014] cefTRIAXone (ROCEPHIN)  IV  1 g Intravenous Q24H  . ezetimibe  10 mg Oral Daily  . insulin aspart  0-5 Units Subcutaneous QHS  . insulin aspart  0-9 Units Subcutaneous TID WC  . lisinopril  10 mg Oral Daily  . pantoprazole  80 mg Oral Daily   Continuous Infusions:   Principal Problem:   Rectus sheath hematoma Active Problems:   Hypertension   Hyperlipidemia   CAP (community acquired pneumonia)    Time spent: 40 minutes.    Vernell Leep, MD, FACP, FHM. [facility] Pager [phone]  If 7PM-7AM, please contact night-coverage [email or web address] Password [email or web address] 08/08/2014, 2:40 PM    LOS: 0 days

## 2014-08-08 NOTE — Consult Note (Signed)
Reason for Consult:abdominal pain Referring Physician: Dr. Consepcion Curtis Perez is an 63 y.o. male.  HPI: the patient is a 63 year old white male who recently had an upper respiratory infection. He has been doing a lot of coughing over the last couple weeks. During this same time he has had soreness of his abdominal wall. Tonight he acutely developed worsening pain in his right upper quadrant with a firm area. He denies any nausea or vomiting. He denies any fevers or chills. He underwent a CT scan which shows a rectus hematoma  Past Medical History  Diagnosis Date  . Hypertension   . Hyperlipidemia   . Bradycardia   . Myocardial infarction     2004  . Tobacco abuse 03/28/2012  . 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 CAD (coronary artery disease)     remote MI in 2004 with PCI; s/p CABG x 3 03/2012  . Adenomatous colon polyp 02/15/2009  . Family history of malignant neoplasm of gastrointestinal tract   . Prediabetes   . GERD (gastroesophageal reflux disease)   . Vitamin D deficiency   . Obesity     Past Surgical History  Procedure Laterality Date  . Tonsillectomy    . Knee surgery    . Back surgery    . Coronary angioplasty with stent placement      about 8 years ago at [facility]  . Colonoscopy  02/15/2009  . Coronary artery bypass graft  04/02/2012    Procedure: CORONARY ARTERY BYPASS GRAFTING (CABG);  Surgeon: Rexene Alberts, MD;  Location: [facility];  Service: Open Heart Surgery;  Laterality: N/A;  On pump, times three graphs using endoscopically harvested right greater saphenous vein and left internal mammary artery.     Family History  Problem Relation Age of Onset  . Colon cancer Father   . Hypertension Mother   . Alzheimer's disease Mother   . Colon cancer Paternal Grandmother     Social History:  reports that he has been smoking Cigarettes.  He has a 60 pack-year smoking history. He has never used smokeless  tobacco. He reports that he drinks alcohol. He reports that he does not use illicit drugs.  Allergies: No Known Allergies  Medications: I have reviewed the patient's current medications.  Results for orders placed during the hospital encounter of 08/08/14 (from the past 48 hour(s))  TYPE AND SCREEN     Status: None   Collection Time    08/08/14  2:55 AM      Result Value Ref Range   ABO/RH(D) A POS     Antibody Screen NEG     Sample Expiration 08/11/2014    COMPREHENSIVE METABOLIC PANEL     Status: Abnormal   Collection Time    08/08/14  3:04 AM      Result Value Ref Range   Sodium 130 (*) 137 - 147 mEq/L   Potassium 4.3  3.7 - 5.3 mEq/L   Chloride 93 (*) 96 - 112 mEq/L   CO2 22  19 - 32 mEq/L   Glucose, Bld 184 (*) 70 - 99 mg/dL   BUN 8  6 - 23 mg/dL   Creatinine, Ser 0.93  0.50 - 1.35 mg/dL   Calcium 8.9  8.4 - 10.5 mg/dL   Total Protein 6.0  6.0 - 8.3 g/dL   Albumin 3.2 (*) 3.5 - 5.2 g/dL  AST 13  0 - 37 U/L   ALT 11  0 - 53 U/L   Alkaline Phosphatase 101  39 - 117 U/L   Total Bilirubin 0.6  0.3 - 1.2 mg/dL   GFR calc non Af Amer 89 (*) >90 mL/min   GFR calc Af Amer >90  >90 mL/min   Comment: (NOTE)     The eGFR has been calculated using the CKD EPI equation.     This calculation has not been validated in all clinical situations.     eGFR's persistently <90 mL/min signify possible Chronic Kidney     Disease.   Anion gap 15  5 - 15  CBC WITH DIFFERENTIAL     Status: Abnormal   Collection Time    08/08/14  3:04 AM      Result Value Ref Range   WBC 10.1  4.0 - 10.5 K/uL   RBC 4.40  4.22 - 5.81 MIL/uL   Hemoglobin 13.2  13.0 - 17.0 g/dL   HCT 37.1 (*) 39.0 - 52.0 %   MCV 84.3  78.0 - 100.0 fL   MCH 30.0  26.0 - 34.0 pg   MCHC 35.6  30.0 - 36.0 g/dL   RDW 12.7  11.5 - 15.5 %   Platelets 208  150 - 400 K/uL   Neutrophils Relative % 86 (*) 43 - 77 %   Neutro Abs 8.7 (*) 1.7 - 7.7 K/uL   Lymphocytes Relative 7 (*) 12 - 46 %   Lymphs Abs 0.7  0.7 - 4.0 K/uL    Monocytes Relative 6  3 - 12 %   Monocytes Absolute 0.7  0.1 - 1.0 K/uL   Eosinophils Relative 1  0 - 5 %   Eosinophils Absolute 0.1  0.0 - 0.7 K/uL   Basophils Relative 0  0 - 1 %   Basophils Absolute 0.0  0.0 - 0.1 K/uL  I-STAT TROPOININ, ED     Status: None   Collection Time    08/08/14  3:07 AM      Result Value Ref Range   Troponin i, poc 0.01  0.00 - 0.08 ng/mL   Comment 3            Comment: Due to the release kinetics of cTnI,     a negative result within the first hours     of the onset of symptoms does not rule out     myocardial infarction with certainty.     If myocardial infarction is still suspected,     repeat the test at appropriate intervals.  I-STAT CG4 LACTIC ACID, ED     Status: None   Collection Time    08/08/14  3:08 AM      Result Value Ref Range   Lactic Acid, Venous 2.12  0.5 - 2.2 mmol/L  I-STAT CHEM 8, ED     Status: Abnormal   Collection Time    08/08/14  3:08 AM      Result Value Ref Range   Sodium 128 (*) 137 - 147 mEq/L   Potassium 4.1  3.7 - 5.3 mEq/L   Chloride 94 (*) 96 - 112 mEq/L   BUN 7  6 - 23 mg/dL   Creatinine, Ser 1.00  0.50 - 1.35 mg/dL   Glucose, Bld 192 (*) 70 - 99 mg/dL   Calcium, Ion 1.14  1.13 - 1.30 mmol/L   TCO2 24  0 - 100 mmol/L   Hemoglobin 13.3  13.0 -  17.0 g/dL   HCT 39.0  39.0 - 52.0 %    Ct Angio Chest W/cm &/or Wo Cm  08/08/2014   CLINICAL DATA:  BLEEDING/BRUISING ABDOMINAL PAIN  EXAM: CT ANGIOGRAPHY CHEST WITH CONTRAST  TECHNIQUE: Multidetector CT imaging of the chest was performed using the standard protocol during bolus administration of intravenous contrast. Multiplanar CT image reconstructions and MIPs were obtained to evaluate the vascular anatomy.  CONTRAST:  148m OMNIPAQUE IOHEXOL 350 MG/ML SOLN  COMPARISON:  Prior radiograph earlier the same day  FINDINGS: The thyroid gland is within normal limits.  No pathologically enlarged mediastinal hilar adenopathy. There is a 1.4 cm right hilar lymph node, indeterminate.  No axillary adenopathy.  Intrathoracic aorta is of normal caliber without evidence of aneurysm. No dissection or other acute abnormality seen. In the abdomen, a small linear filling defect is seen within the left lateral aspect of the intra-abdominal aorta at the level of the SMA, suggestive of a small dissection flap (series 5, image 117). This is best seen on sagittal sequence (series 9, image 104). This is likely chronic in nature. Both lumens are well opacified, and there are no acute inflammatory changes in this region. The celiac axis and SMA are well opacified. There are 2 left renal arteries with single right renal artery, all of which are opacified.  Sequelae of prior median sternotomy noted. Heart size is within normal limits. No pericardial effusion.  Main pulmonary artery within normal limits for diameter. No definite abnormality seen within the pulmonary arteries. Please note that this study is not optimized for evaluation for pulmonary embolism.  Scattered nodular and patchy opacity or present within the posterior right lower lobe, which may reflect infiltrate or possibly sequelae of aspiration. Mild bronchiectatic changes seen within this region. Lungs are otherwise clear without pulmonary edema or pleural effusion. No other focal infiltrates.  Visualized liver within normal limits. Gallbladder is contracted but otherwise unremarkable. The spleen, adrenal glands, and pancreas are within normal limits.  Stomach is unremarkable.  Visualized kidneys are within normal limits.  A right-sided rectus sheath hematoma measuring 17.8 x 6.3 cm partially visualized within the anterior right abdomen (series 5, image 114). There is are a few small blushes of active contrast extravasation in the body of the hematoma, possibly from the right superior epigastric artery (series 5, image 110, 111).  IMPRESSION: 1. 17.8 x 6.3 cm hematoma within the right rectus abdominus sheath, incompletely visualized. There is a small  blush of active contrast extravasation within this hematoma, likely arising from the right superior epigastric artery (or its branches). Follow-up examination with dedicated imaging of the remaining abdomen and pelvis to completely characterize this hematoma could be considered. 2. 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 This is favored to be chronic in nature, and may be related to a chronic penetrating atherosclerotic ulcer. No contrast extravasation or associated inflammation. No other acute abnormality identified within the visualized thoracic and abdominal aorta. 3. Focal patchy and nodular opacities within the posterior right lower lobe, which may reflect focal pneumonia or possibly sequelae of aspiration. 4. 1.4 cm right hilar lymph node, indeterminate, but may be reactive in nature. Critical Value/emergent results were called by telephone at the time of interpretation on 08/08/2014 at 4:55 am to Dr. JElyn Peers, who verbally acknowledged these results.   Electronically Signed   By: BJeannine BogaM.D.   On: 08/08/2014 04:54   Dg Chest Portable 1  View  08/08/2014   CLINICAL DATA:  Bleeding and bruising.  Abdominal pain  EXAM: PORTABLE CHEST - 1 VIEW  COMPARISON:  04/29/2012  FINDINGS: No cardiomegaly. Negative upper mediastinal contours. Patient is status post CABG.  There is no edema, consolidation, effusion, or pneumothorax.  IMPRESSION: No active disease.   Electronically Signed   By: Jorje Guild M.D.   On: 08/08/2014 03:22   Dg Abd Portable 1v  08/08/2014   CLINICAL DATA:  Abdominal pain  EXAM: PORTABLE ABDOMEN - 1 VIEW  COMPARISON:  12/14/2003  FINDINGS: The upper abdomen is excluded from view. The majority of the bowel gas is visualized however (excluding the stomach) and is nonobstructive. There is no concerning intra-abdominal mass effect or calcification. Lower lumbar facet osteoarthritis.  IMPRESSION:  Nonobstructive bowel gas pattern.   Electronically Signed   By: Jorje Guild M.D.   On: 08/08/2014 03:15    Review of Systems  Constitutional: Negative.   HENT: Negative.   Eyes: Negative.   Respiratory: Positive for cough.   Cardiovascular: Negative.   Gastrointestinal: Positive for abdominal pain. Negative for nausea and vomiting.  Genitourinary: Negative.   Musculoskeletal: Negative.   Skin: Negative.   Neurological: Negative.   Endo/Heme/Allergies: Negative.   Psychiatric/Behavioral: Negative.    Blood pressure 158/70, pulse 62, temperature 98 F (36.7 C), temperature source Oral, resp. rate 22, SpO2 96.00%. Physical Exam  Constitutional: He is oriented to person, place, and time. He appears well-developed and well-nourished.  HENT:  Head: Normocephalic and atraumatic.  Eyes: Conjunctivae and EOM are normal. Pupils are equal, round, and reactive to light.  Neck: Normal range of motion. Neck supple.  Cardiovascular: Normal rate, regular rhythm and normal heart sounds.   Respiratory: Effort normal and breath sounds normal.  GI: Soft. Bowel sounds are normal.  There is a firm tender area in the RUQ  Musculoskeletal: Normal range of motion.  Neurological: He is alert and oriented to person, place, and time.  Skin: Skin is warm and dry.  Psychiatric: He has a normal mood and affect. His behavior is normal.    Assessment/Plan: The patient appears to have a rectus hematoma. The patient is very stable. At this point there is no indication for operative intervention of the hematoma. It will most likely tamponade and  stop. His hemoglobin is stable. I would correct his coags and hold any blood thinners. I would treat his pain symptomatically. Please call if there is anything else we can do  TOTH III,Aritza Brunet S 08/08/2014, 5:41 AM

## 2014-08-08 NOTE — ED Provider Notes (Signed)
CSN: [id number]     Arrival date & time 08/08/14  0212 History   First MD Initiated Contact with Patient 08/08/14 [phone]     Chief Complaint  Patient presents with  . Bleeding/Bruising  . Abdominal Pain     (Consider location/radiation/quality/duration/timing/severity/associated sxs/prior Treatment) HPI  Patient is a 63 yo man who comes in with severe RUQ abdominal pain following a bout of coughing which occurred a couple of hours PTA. Pain 10/10, aching and sharp. Patient notes similar but, much less severe pain with coughing over the past few days. He now has persistent pain and a feeling of swelling or mass effect int he RUQ.   No fever. No SOB. Cough is productive. Patient is not anticoagulated but, takes ASA.   Past Medical History  Diagnosis Date  . Hypertension   . Hyperlipidemia   . Bradycardia   . Myocardial infarction     2004  . Tobacco abuse 03/28/2012  . 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 CAD (coronary artery disease)     remote MI in 2004 with PCI; s/p CABG x 3 03/2012  . Adenomatous colon polyp 02/15/2009  . Family history of malignant neoplasm of gastrointestinal tract   . Prediabetes   . GERD (gastroesophageal reflux disease)   . Vitamin D deficiency   . Obesity    Past Surgical History  Procedure Laterality Date  . Tonsillectomy    . Knee surgery    . Back surgery    . Coronary angioplasty with stent placement      about 8 years ago at [facility]  . Colonoscopy  02/15/2009  . Coronary artery bypass graft  04/02/2012    Procedure: CORONARY ARTERY BYPASS GRAFTING (CABG);  Surgeon: Rexene Alberts, MD;  Location: [facility];  Service: Open Heart Surgery;  Laterality: N/A;  On pump, times three graphs using endoscopically harvested right greater saphenous vein and left internal mammary artery.    Family History  Problem Relation Age of Onset  . Colon cancer Father   . Hypertension Mother   .  Alzheimer's disease Mother   . Colon cancer Paternal Grandmother    History  Substance Use Topics  . Smoking status: Current Every Day Smoker -- 1.50 packs/day for 40 years    Types: Cigarettes  . Smokeless tobacco: Never Used     Comment: down from 2ppd  . Alcohol Use: Yes     Comment: Rarely    Review of Systems  10 point review of symptoms obtained and is negative with the exceptions of symptoms noted abov.e   Allergies  Review of patient's allergies indicates no known allergies.  Home Medications   Prior to Admission medications   Medication Sig Start Date End Date Taking? Authorizing Provider  aspirin 325 MG tablet Take 325 mg by mouth daily.   Yes Historical Provider, MD  atenolol (TENORMIN) 50 MG tablet Take 50 mg by mouth daily.   Yes Historical Provider, MD  atorvastatin (LIPITOR) 80 MG tablet Take 40 mg by mouth daily.   Yes Historical Provider, MD  Cyanocobalamin (VITAMIN B-12 PO) Take 1,000 mcg by mouth daily.    Yes Historical Provider, MD  ezetimibe (ZETIA) 10 MG tablet Take 10 mg by mouth daily.   Yes Historical Provider, MD  lisinopril (PRINIVIL,ZESTRIL) 10 MG tablet Take 10 mg by mouth daily.   Yes Historical Provider, MD  Magnesium 400 MG CAPS Take 400 mg by mouth. Takes 4 per day   Yes Historical Provider, MD  metFORMIN (GLUCOPHAGE-XR) 500 MG 24 hr tablet Take 500-1,000 mg by mouth 3 (three) times daily. Take 1 tablet (500mg ) with breakfast and lunch and 2 tablets (1000mg ) at supper   Yes Historical Provider, MD  Omega-3 Fatty Acids (FISH OIL PO) Take 2,000 mg by mouth daily.   Yes Historical Provider, MD  omeprazole (PRILOSEC) 40 MG capsule Take 40 mg by mouth 2 (two) times daily.   Yes Historical Provider, MD  Vitamin D, Ergocalciferol, (DRISDOL) 50000 UNITS CAPS capsule Take 50,000 Units by mouth 3 (three) times a week.   Yes Historical Provider, MD   BP 126/57  Pulse 58  Temp(Src) 98 F (36.7 C) (Oral)  Resp 22  SpO2 94% Physical Exam  Gen: well  nourished and well developed appearing Head: NCAT Ears: normal to inspection Nose: normal to inspection, no epistaxis or drainage Mouth: oral mucsoa is well hydrated appearing, normal posterior oropharynx Neck: supple, no stridor CV: RRR, no murmur, palpable peripheral pulses Resp: lung sounds are clear to auscultation bilaterally, no wheeing or rhonchi or rales, normal respiratory effort.  Abd: obese, there is a firm, warm mass over the patient's RUQ which is tender. Also noticed is some mild bruising of the skin in this region. Remainder of abdomen is nontender and soft.  Extremities: normal to inspection.  Skin: warm and dry Neuro: CN ii - XII, no focal deficitis Psyche; normal affect, cooperative.   ED Course  Procedures (including critical care time) Labs Review Labs Reviewed  COMPREHENSIVE METABOLIC PANEL - Abnormal; Notable for the following:    Sodium 130 (*)    Chloride 93 (*)    Glucose, Bld 184 (*)    Albumin 3.2 (*)    GFR calc non Af Amer 89 (*)    All other components within normal limits  CBC WITH DIFFERENTIAL - Abnormal; Notable for the following:    HCT 37.1 (*)    Neutrophils Relative % 86 (*)    Neutro Abs 8.7 (*)    Lymphocytes Relative 7 (*)    All other components within normal limits  I-STAT CHEM 8, ED - Abnormal; Notable for the following:    Sodium 128 (*)    Chloride 94 (*)    Glucose, Bld 192 (*)    All other components within normal limits  I-STAT TROPOININ, ED  I-STAT CG4 LACTIC ACID, ED  TYPE AND SCREEN    Imaging Review Dg Chest Portable 1 View  08/08/2014   CLINICAL DATA:  Bleeding and bruising.  Abdominal pain  EXAM: PORTABLE CHEST - 1 VIEW  COMPARISON:  04/29/2012  FINDINGS: No cardiomegaly. Negative upper mediastinal contours. Patient is status post CABG.  There is no edema, consolidation, effusion, or pneumothorax.  IMPRESSION: No active disease.   Electronically Signed   By: Jorje Guild M.D.   On: 08/08/2014 03:22   Dg Abd Portable  1v  08/08/2014   CLINICAL DATA:  Abdominal pain  EXAM: PORTABLE ABDOMEN - 1 VIEW  COMPARISON:  12/14/2003  FINDINGS: The upper abdomen is excluded from view. The majority of the bowel gas is visualized however (excluding the stomach) and is nonobstructive. There is no concerning intra-abdominal mass effect or calcification. Lower lumbar facet osteoarthritis.  IMPRESSION: Nonobstructive bowel gas pattern.   Electronically Signed   By: Jorje Guild M.D.   On: 08/08/2014 03:15   IMPRESSION: 1. 17.8 x 6.3 cm  hematoma within the right rectus abdominus sheath, incompletely visualized. There is a small blush of active contrast extravasation within this hematoma, likely arising from the right superior epigastric artery (or its branches). Follow-up examination with dedicated imaging of the remaining abdomen and pelvis to completely characterize this hematoma could be considered. 2. 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 This is favored to be chronic in nature, and may be related to a chronic penetrating atherosclerotic ulcer. No contrast extravasation or associated inflammation. No other acute abnormality identified within the visualized thoracic and abdominal aorta. 3. Focal patchy and nodular opacities within the posterior right lower lobe, which may reflect focal pneumonia or possibly sequelae of aspiration. 4. 1.4 cm right hilar lymph node, indeterminate, but may be reactive in nature. Critical Value/emergent results were called by telephone at the time of interpretation on 08/08/2014 at 4:55 am to Dr. Elyn Peers , who verbally acknowledged these results.   Electronically Signed By: Jeannine Boga M.D. On: 08/08/2014 04:54   EKG: nsr, no acute ischemic changes, normal intervals, normal axis, normal qrs complex  MDM   The patient has a spontaneous rectus sheath hematoma. Case discussed briefly with Dr. Marlou Starks  who states that this is not a surgical case. I've asked the hospitalist to admit the patient for observation.    Elyn Peers, MD 08/09/14 0830

## 2014-08-08 NOTE — H&P (Addendum)
[facility] History and Physical  Curtis Perez NIO:[id number] DOB: 14-Jun-1951 DOA: 08/08/2014  Referring physician: EDP PCP: Alesia Richards, MD   Chief Complaint: Abdominal pain   HPI: Curtis Perez is a 63 y.o. male who presents to the ED with severe RUQ abdominal pain following a bout of coughing which occurred several hours PTA.  Pain is 10/10 in intensity, aching and sharp.  There is associated feeling of mass effect on his upper abdomen.  He has had similar but much less severe pain with coughing over the past few days.  Pain has been persistent since onset.  No fever, no SOB, cough is productive.  Patient is not anticoagulated except he does take ASA 325 daily for his h/o CAD.  Review of Systems: Systems reviewed.  As above, otherwise negative  Past Medical History  Diagnosis Date  . Hypertension   . Hyperlipidemia   . Bradycardia   . Myocardial infarction     2004  . Tobacco abuse 03/28/2012  . 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 CAD (coronary artery disease)     remote MI in 2004 with PCI; s/p CABG x 3 03/2012  . Adenomatous colon polyp 02/15/2009  . Family history of malignant neoplasm of gastrointestinal tract   . Prediabetes   . GERD (gastroesophageal reflux disease)   . Vitamin D deficiency   . Obesity    Past Surgical History  Procedure Laterality Date  . Tonsillectomy    . Knee surgery    . Back surgery    . Coronary angioplasty with stent placement      about 8 years ago at [facility]  . Colonoscopy  02/15/2009  . Coronary artery bypass graft  04/02/2012    Procedure: CORONARY ARTERY BYPASS GRAFTING (CABG);  Surgeon: Rexene Alberts, MD;  Location: [facility];  Service: Open Heart Surgery;  Laterality: N/A;  On pump, times three graphs using endoscopically harvested right greater saphenous vein and left internal mammary artery.    Social History:  reports that he has been smoking Cigarettes.   He has a 60 pack-year smoking history. He has never used smokeless tobacco. He reports that he drinks alcohol. He reports that he does not use illicit drugs.  No Known Allergies  Family History  Problem Relation Age of Onset  . Colon cancer Father   . Hypertension Mother   . Alzheimer's disease Mother   . Colon cancer Paternal Grandmother      Prior to Admission medications   Medication Sig Start Date End Date Taking? Authorizing Provider  aspirin 325 MG tablet Take 325 mg by mouth daily.   Yes Historical Provider, MD  atenolol (TENORMIN) 50 MG tablet Take 50 mg by mouth daily.   Yes Historical Provider, MD  atorvastatin (LIPITOR) 80 MG tablet Take 40 mg by mouth daily.   Yes Historical Provider, MD  Cyanocobalamin (VITAMIN B-12 PO) Take 1,000 mcg by mouth daily.    Yes Historical Provider, MD  ezetimibe (ZETIA) 10 MG tablet Take 10 mg by mouth daily.   Yes Historical Provider, MD  lisinopril (PRINIVIL,ZESTRIL) 10 MG tablet Take 10 mg by mouth daily.   Yes Historical Provider, MD  Magnesium 400 MG CAPS Take 400 mg by mouth. Takes 4 per day   Yes Historical Provider, MD  metFORMIN (GLUCOPHAGE-XR) 500 MG 24 hr tablet Take 500-1,000 mg by mouth 3 (three)  times daily. Take 1 tablet (500mg ) with breakfast and lunch and 2 tablets (1000mg ) at supper   Yes Historical Provider, MD  Omega-3 Fatty Acids (FISH OIL PO) Take 2,000 mg by mouth daily.   Yes Historical Provider, MD  omeprazole (PRILOSEC) 40 MG capsule Take 40 mg by mouth 2 (two) times daily.   Yes Historical Provider, MD  Vitamin D, Ergocalciferol, (DRISDOL) 50000 UNITS CAPS capsule Take 50,000 Units by mouth 3 (three) times a week.   Yes Historical Provider, MD   Physical Exam: Filed Vitals:   08/08/14 0500  BP: 158/70  Pulse: 62  Temp:   Resp: 22    BP 158/70  Pulse 62  Temp(Src) 98 F (36.7 C) (Oral)  Resp 22  SpO2 96%  General Appearance:    Alert, oriented, no distress, appears stated age  Head:    Normocephalic,  atraumatic  Eyes:    PERRL, EOMI, sclera non-icteric        Nose:   Nares without drainage or epistaxis. Mucosa, turbinates normal  Throat:   Moist mucous membranes. Oropharynx without erythema or exudate.  Neck:   Supple. No carotid bruits.  No thyromegaly.  No lymphadenopathy.   Back:     No CVA tenderness, no spinal tenderness  Lungs:     Clear to auscultation bilaterally, without wheezes, rhonchi or rales  Chest wall:    No tenderness to palpitation  Heart:    Regular rate and rhythm without murmurs, gallops, rubs  Abdomen:     Tender in RUQ with mass and bruising  Genitalia:    deferred  Rectal:    deferred  Extremities:   No clubbing, cyanosis or edema.  Pulses:   2+ and symmetric all extremities  Skin:   Skin color, texture, turgor normal, no rashes or lesions  Lymph nodes:   Cervical, supraclavicular, and axillary nodes normal  Neurologic:   CNII-XII intact. Normal strength, sensation and reflexes      throughout    Labs on Admission:  Basic Metabolic Panel:  Recent Labs Lab 08/08/14 0304 08/08/14 0308  NA 130* 128*  K 4.3 4.1  CL 93* 94*  CO2 22  --   GLUCOSE 184* 192*  BUN 8 7  CREATININE 0.93 1.00  CALCIUM 8.9  --    Liver Function Tests:  Recent Labs Lab 08/08/14 0304  AST 13  ALT 11  ALKPHOS 101  BILITOT 0.6  PROT 6.0  ALBUMIN 3.2*   No results found for this basename: LIPASE, AMYLASE,  in the last 168 hours No results found for this basename: AMMONIA,  in the last 168 hours CBC:  Recent Labs Lab 08/08/14 0304 08/08/14 0308  WBC 10.1  --   NEUTROABS 8.7*  --   HGB 13.2 13.3  HCT 37.1* 39.0  MCV 84.3  --   PLT 208  --    Cardiac Enzymes: No results found for this basename: CKTOTAL, CKMB, CKMBINDEX, TROPONINI,  in the last 168 hours  BNP (last 3 results) No results found for this basename: PROBNP,  in the last 8760 hours CBG: No results found for this basename: GLUCAP,  in the last 168 hours  Radiological Exams on Admission: Ct Angio  Chest W/cm &/or Wo Cm  08/08/2014   CLINICAL DATA:  BLEEDING/BRUISING ABDOMINAL PAIN  EXAM: CT ANGIOGRAPHY CHEST WITH CONTRAST  TECHNIQUE: Multidetector CT imaging of the chest was performed using the standard protocol during bolus administration of intravenous contrast. Multiplanar CT image reconstructions and MIPs  were obtained to evaluate the vascular anatomy.  CONTRAST:  134mL OMNIPAQUE IOHEXOL 350 MG/ML SOLN  COMPARISON:  Prior radiograph earlier the same day  FINDINGS: The thyroid gland is within normal limits.  No pathologically enlarged mediastinal hilar adenopathy. There is a 1.4 cm right hilar lymph node, indeterminate. No axillary adenopathy.  Intrathoracic aorta is of normal caliber without evidence of aneurysm. No dissection or other acute abnormality seen. In the abdomen, a small linear filling defect is seen within the left lateral aspect of the intra-abdominal aorta at the level of the SMA, suggestive of a small dissection flap (series 5, image 117). This is best seen on sagittal sequence (series 9, image 104). This is likely chronic in nature. Both lumens are well opacified, and there are no acute inflammatory changes in this region. The celiac axis and SMA are well opacified. There are 2 left renal arteries with single right renal artery, all of which are opacified.  Sequelae of prior median sternotomy noted. Heart size is within normal limits. No pericardial effusion.  Main pulmonary artery within normal limits for diameter. No definite abnormality seen within the pulmonary arteries. Please note that this study is not optimized for evaluation for pulmonary embolism.  Scattered nodular and patchy opacity or present within the posterior right lower lobe, which may reflect infiltrate or possibly sequelae of aspiration. Mild bronchiectatic changes seen within this region. Lungs are otherwise clear without pulmonary edema or pleural effusion. No other focal infiltrates.  Visualized liver within normal  limits. Gallbladder is contracted but otherwise unremarkable. The spleen, adrenal glands, and pancreas are within normal limits.  Stomach is unremarkable.  Visualized kidneys are within normal limits.  A right-sided rectus sheath hematoma measuring 17.8 x 6.3 cm partially visualized within the anterior right abdomen (series 5, image 114). There is are a few small blushes of active contrast extravasation in the body of the hematoma, possibly from the right superior epigastric artery (series 5, image 110, 111).  IMPRESSION: 1. 17.8 x 6.3 cm hematoma within the right rectus abdominus sheath, incompletely visualized. There is a small blush of active contrast extravasation within this hematoma, likely arising from the right superior epigastric artery (or its branches). Follow-up examination with dedicated imaging of the remaining abdomen and pelvis to completely characterize this hematoma could be considered. 2. 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 This is favored to be chronic in nature, and may be related to a chronic penetrating atherosclerotic ulcer. No contrast extravasation or associated inflammation. No other acute abnormality identified within the visualized thoracic and abdominal aorta. 3. Focal patchy and nodular opacities within the posterior right lower lobe, which may reflect focal pneumonia or possibly sequelae of aspiration. 4. 1.4 cm right hilar lymph node, indeterminate, but may be reactive in nature. Critical Value/emergent results were called by telephone at the time of interpretation on 08/08/2014 at 4:55 am to Dr. Elyn Peers , who verbally acknowledged these results.   Electronically Signed   By: Jeannine Boga M.D.   On: 08/08/2014 04:54   Dg Chest Portable 1 View  08/08/2014   CLINICAL DATA:  Bleeding and bruising.  Abdominal pain  EXAM: PORTABLE CHEST - 1 VIEW  COMPARISON:  04/29/2012  FINDINGS: No cardiomegaly.  Negative upper mediastinal contours. Patient is status post CABG.  There is no edema, consolidation, effusion, or pneumothorax.  IMPRESSION: No active disease.   Electronically Signed   By: Gilford Silvius.D.  On: 08/08/2014 03:22   Dg Abd Portable 1v  08/08/2014   CLINICAL DATA:  Abdominal pain  EXAM: PORTABLE ABDOMEN - 1 VIEW  COMPARISON:  12/14/2003  FINDINGS: The upper abdomen is excluded from view. The majority of the bowel gas is visualized however (excluding the stomach) and is nonobstructive. There is no concerning intra-abdominal mass effect or calcification. Lower lumbar facet osteoarthritis.  IMPRESSION: Nonobstructive bowel gas pattern.   Electronically Signed   By: Jorje Guild M.D.   On: 08/08/2014 03:15    EKG: Independently reviewed.  Assessment/Plan Principal Problem:   Rectus sheath hematoma Active Problems:   Hypertension   Hyperlipidemia   CAP (community acquired pneumonia)   1. Rectus sheath hematoma - 1. HGB is okay thus far at 13, repeat HGB ordered for noon today 2. Treating pain with morphine and suppressing his cough with tessalon 3. General surgery has seen patient at bedside, hematoma is non-operative and expected to tamponade itself off 4. Normal saline for hydration. 2. CAP - 1. On PNA pathway 2. Rocephin and azithromycin 3. CAP itself isnt very severe, except that the coughing has caused issue #1 above 3. HTN - continue home meds 4. HLD - continue statin 5. Prediabetes - holding metformin as he just received IV contrast, monitor CBG AC/HS    Code Status: Full Code  Family Communication: No family in room Disposition Plan: Admit to inpatient   Time spent: 70 min  Jarian Longoria M. [facility] Pager [phone]  If 7AM-7PM, please contact the day team taking care of the patient [email or web address] Password [email or web address] 08/08/2014, 5:30 AM

## 2014-08-09 ENCOUNTER — Encounter (HOSPITAL_COMMUNITY): Payer: Self-pay | Admitting: Emergency Medicine

## 2014-08-09 DIAGNOSIS — E871 Hypo-osmolality and hyponatremia: Secondary | ICD-10-CM

## 2014-08-09 DIAGNOSIS — D62 Acute posthemorrhagic anemia: Secondary | ICD-10-CM

## 2014-08-09 LAB — OSMOLALITY, URINE: Osmolality, Ur: 155 mOsm/kg — ABNORMAL LOW (ref 390–1090)

## 2014-08-09 LAB — BASIC METABOLIC PANEL
Anion gap: 12 (ref 5–15)
Anion gap: 9 (ref 5–15)
BUN: 9 mg/dL (ref 6–23)
BUN: 9 mg/dL (ref 6–23)
CO2: 22 mEq/L (ref 19–32)
CO2: 27 mEq/L (ref 19–32)
Calcium: 8.7 mg/dL (ref 8.4–10.5)
Calcium: 8.5 mg/dL (ref 8.4–10.5)
Chloride: 93 mEq/L — ABNORMAL LOW (ref 96–112)
Chloride: 91 mEq/L — ABNORMAL LOW (ref 96–112)
Creatinine, Ser: 0.82 mg/dL (ref 0.50–1.35)
Creatinine, Ser: 0.94 mg/dL (ref 0.50–1.35)
GFR calc Af Amer: >90 mL/min (ref >90)
GFR calc Af Amer: >90 mL/min (ref >90)
GFR calc non Af Amer: >90 mL/min (ref >90)
GFR calc non Af Amer: 89 mL/min — ABNORMAL LOW (ref >90)
Glucose, Bld: 149 mg/dL — ABNORMAL HIGH (ref 70–99)
Glucose, Bld: 142 mg/dL — ABNORMAL HIGH (ref 70–99)
Potassium: 4.3 mEq/L (ref 3.7–5.3)
Potassium: 5 mEq/L (ref 3.7–5.3)
Sodium: 127 mEq/L — ABNORMAL LOW (ref 137–147)
Sodium: 127 mEq/L — ABNORMAL LOW (ref 137–147)

## 2014-08-09 LAB — CBC
HCT: 28.6 % — ABNORMAL LOW (ref 39.0–52.0)
HCT: 30.8 % — ABNORMAL LOW (ref 39.0–52.0)
Hemoglobin: 10 g/dL — ABNORMAL LOW (ref 13.0–17.0)
Hemoglobin: 11 g/dL — ABNORMAL LOW (ref 13.0–17.0)
MCH: 29 pg (ref 26.0–34.0)
MCH: 29.9 pg (ref 26.0–34.0)
MCHC: 35 g/dL (ref 30.0–36.0)
MCHC: 35.7 g/dL (ref 30.0–36.0)
MCV: 82.9 fL (ref 78.0–100.0)
MCV: 83.7 fL (ref 78.0–100.0)
Platelets: 178 10*3/uL (ref 150–400)
Platelets: 206 10*3/uL (ref 150–400)
RBC: 3.45 MIL/uL — ABNORMAL LOW (ref 4.22–5.81)
RBC: 3.68 MIL/uL — ABNORMAL LOW (ref 4.22–5.81)
RDW: 12.9 % (ref 11.5–15.5)
RDW: 13 % (ref 11.5–15.5)
WBC: 7.1 10*3/uL (ref 4.0–10.5)
WBC: 7.3 10*3/uL (ref 4.0–10.5)

## 2014-08-09 LAB — GLUCOSE, CAPILLARY
Glucose-Capillary: 124 mg/dL — ABNORMAL HIGH (ref 70–99)
Glucose-Capillary: 114 mg/dL — ABNORMAL HIGH (ref 70–99)
Glucose-Capillary: 107 mg/dL — ABNORMAL HIGH (ref 70–99)
Glucose-Capillary: 101 mg/dL — ABNORMAL HIGH (ref 70–99)
Glucose-Capillary: 106 mg/dL — ABNORMAL HIGH (ref 70–99)

## 2014-08-09 LAB — HEMOGLOBIN A1C
Hgb A1c MFr Bld: 6.7 % — ABNORMAL HIGH (ref <5.7)
Mean Plasma Glucose: 146 mg/dL — ABNORMAL HIGH (ref <117)

## 2014-08-09 LAB — LEGIONELLA ANTIGEN, URINE: Legionella Antigen, Urine: NEGATIVE

## 2014-08-09 MED ORDER — PNEUMOCOCCAL VAC POLYVALENT 25 MCG/0.5ML IJ INJ
0.5000 mL | INJECTION | INTRAMUSCULAR | Status: AC
  Administered 2014-08-10: 0.5 mL via INTRAMUSCULAR
  Filled 2014-08-09: qty 0.5

## 2014-08-09 NOTE — Progress Notes (Addendum)
PROGRESS NOTE    Curtis Perez HDQ:[id number] DOB: [birth year] DOA: 08/08/2014 PCP: Alesia Richards, MD  HPI/Brief narrative 63 year old male with history of hypertension, hyperlipidemia, CAD, CABG, GERD, ongoing nonproductive cough without fever or chills for the last 2-4 weeks, presented to the ED with severe RUQ abdominal pain and swelling, following a bout of coughing. Denied fever or chills. In the ED, found to have a rectus sheath hematoma.   Assessment/Plan:  1. Right Rectus sheath hematoma: Secondary to coughing spells. Only on aspirin at home. General surgery evaluated and recommend supportive treatment. Symptomatically better. Some drop in Hb initially but stabilizing. 2. RLL Community acquired pneumonia: Continue IV Rocephin and azithromycin. Consider followup CT in 4-6 weeks to follow PNA and Lung nodule. 3. Acute post hemorrhagic anemia: Hemoglobin dropped from 13.2 > 10.9>11.3>10. Secondary to problem #1. Follow CBCs closely and transfuse if hemoglobin less than 8 g per DL. 4. Hypertension: Controlled. Continue lisinopril and atenolol. 5. History of hyperlipidemia: apparently muscle cramps from statins and has not taken for 3 months- will DC. 6. History of prediabetes: Hold metformin since he received IV contrast. Place on sliding scale insulin. A1c: 6.7. 7. Hyponatremia: Urine and serum osmolarity not consistent with SIADH.? Intravascular volume depletion. Continue IV normal saline. Improving. Follow BMP closely. 8. 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 Reviewed case and imaging with vascular surgeon on call- recommended no further w/u or fu. 9. Right hilar LN: seen on CT. Consider OP follow up imaging 10. Tobacco abuse: States that he's been smoking 2 packs of cigarettes per day. Declines a nicotine patch. Tobacco cessation counseled.   Code Status: Full  Family Communication:  None at bedside Disposition Plan: Home when medically stable.   Consultants:  General surgery  Procedures:  None  Antibiotics:  IV Rocephin 8/22 >  IV azithromycin 8/22 >   Subjective: RUQ abdominal pain and swelling continue to improve. No new complaints.  Objective: Filed Vitals:   08/08/14 1719 08/08/14 2155 08/09/14 0439 08/09/14 0915  BP: 124/60 146/57 115/49 129/56  Pulse: 52 58 68 56  Temp: 98.2 F (36.8 C) 97.9 F (36.6 C) 98 F (36.7 C) 98.4 F (36.9 C)  TempSrc: Oral Oral Oral Oral  Resp: 18 17 18 18   Height:      Weight:  123.696 kg (272 lb 11.2 oz)    SpO2: 97% 97% 96% 97%    Intake/Output Summary (Last 24 hours) at 08/09/14 1608 Last data filed at 08/09/14 1412  Gross per 24 hour  Intake   3485 ml  Output      4 ml  Net   3481 ml   Filed Weights   08/08/14 0644 08/08/14 2155  Weight: 123.923 kg (273 lb 3.2 oz) 123.696 kg (272 lb 11.2 oz)     Exam:  General exam: Pleasant middle-aged male lying comfortably in bed. Respiratory system: Occasional basal crackles but otherwise clear to auscultation. No increased work of breathing. Cardiovascular system: S1 & S2 heard, RRR. No JVD, murmurs, gallops, clicks or pedal edema.  Gastrointestinal system: Firm tender area over RUQ without fluctuance or bruit. No peritoneal signs. Normal bowel sounds heard. Central nervous system: Alert and oriented. No focal neurological deficits. Extremities: Symmetric 5 x 5 power.   Data Reviewed: Basic Metabolic Panel:  Recent Labs Lab 08/08/14 0304 08/08/14 0308 08/08/14 1640 08/09/14 0500  NA 130* 128* 123* 127*  K 4.3 4.1 4.8 4.3  CL 93* 94* 88* 93*  CO2 22  --  22 22  GLUCOSE 184* 192* 130* 149*  BUN 8 7 7 9   CREATININE 0.93 1.00 0.78 0.82  CALCIUM 8.9  --  8.8 8.7   Liver Function Tests:  Recent Labs Lab 08/08/14 0304  AST 13  ALT 11  ALKPHOS 101  BILITOT 0.6  PROT 6.0  ALBUMIN 3.2*   No results found for this basename: LIPASE, AMYLASE,   in the last 168 hours No results found for this basename: AMMONIA,  in the last 168 hours CBC:  Recent Labs Lab 08/08/14 0304 08/08/14 0308 08/08/14 1225 08/08/14 1640 08/09/14 0500  WBC 10.1  --   --  10.2 7.1  NEUTROABS 8.7*  --   --   --   --   HGB 13.2 13.3 10.9* 11.3* 10.0*  HCT 37.1* 39.0 30.4* 31.7* 28.6*  MCV 84.3  --   --  82.8 82.9  PLT 208  --   --  219 178   Cardiac Enzymes: No results found for this basename: CKTOTAL, CKMB, CKMBINDEX, TROPONINI,  in the last 168 hours BNP (last 3 results) No results found for this basename: PROBNP,  in the last 8760 hours CBG:  Recent Labs Lab 08/08/14 1155 08/08/14 1651 08/08/14 2149 08/09/14 0750 08/09/14 1150  GLUCAP 113* 134* 124* 114* 107*    Recent Results (from the past 240 hour(s))  CULTURE, BLOOD (ROUTINE X 2)     Status: None   Collection Time    08/08/14  7:14 AM      Result Value Ref Range Status   Specimen Description BLOOD RIGHT HAND   Final   Special Requests BOTTLES DRAWN AEROBIC AND ANAEROBIC 10CC   Final   Culture  Setup Time     Final   Value: 08/08/2014 20:00     Performed at [facility]   Culture     Final   Value:        BLOOD CULTURE RECEIVED NO GROWTH TO DATE CULTURE WILL BE HELD FOR 5 DAYS BEFORE ISSUING A FINAL NEGATIVE REPORT     Performed at [facility]   Report Status PENDING   Incomplete  CULTURE, BLOOD (ROUTINE X 2)     Status: None   Collection Time    08/08/14  7:22 AM      Result Value Ref Range Status   Specimen Description BLOOD LEFT HAND   Final   Special Requests BOTTLES DRAWN AEROBIC ONLY 5CC   Final   Culture  Setup Time     Final   Value: 08/08/2014 20:00     Performed at [facility]   Culture     Final   Value:        BLOOD CULTURE RECEIVED NO GROWTH TO DATE CULTURE WILL BE HELD FOR 5 DAYS BEFORE ISSUING A FINAL NEGATIVE REPORT     Performed at [facility]   Report Status PENDING   Incomplete          Studies: Ct Angio Chest  W/cm &/or Wo Cm  08/08/2014   CLINICAL DATA:  BLEEDING/BRUISING ABDOMINAL PAIN  EXAM: CT ANGIOGRAPHY CHEST WITH CONTRAST  TECHNIQUE: Multidetector CT imaging of the chest was performed using the standard protocol during bolus administration of intravenous contrast. Multiplanar CT image reconstructions and MIPs were obtained to evaluate the vascular anatomy.  CONTRAST:  160mL OMNIPAQUE IOHEXOL 350 MG/ML SOLN  COMPARISON:  Prior radiograph earlier the same  day  FINDINGS: The thyroid gland is within normal limits.  No pathologically enlarged mediastinal hilar adenopathy. There is a 1.4 cm right hilar lymph node, indeterminate. No axillary adenopathy.  Intrathoracic aorta is of normal caliber without evidence of aneurysm. No dissection or other acute abnormality seen. In the abdomen, a small linear filling defect is seen within the left lateral aspect of the intra-abdominal aorta at the level of the SMA, suggestive of a small dissection flap (series 5, image 117). This is best seen on sagittal sequence (series 9, image 104). This is likely chronic in nature. Both lumens are well opacified, and there are no acute inflammatory changes in this region. The celiac axis and SMA are well opacified. There are 2 left renal arteries with single right renal artery, all of which are opacified.  Sequelae of prior median sternotomy noted. Heart size is within normal limits. No pericardial effusion.  Main pulmonary artery within normal limits for diameter. No definite abnormality seen within the pulmonary arteries. Please note that this study is not optimized for evaluation for pulmonary embolism.  Scattered nodular and patchy opacity or present within the posterior right lower lobe, which may reflect infiltrate or possibly sequelae of aspiration. Mild bronchiectatic changes seen within this region. Lungs are otherwise clear without pulmonary edema or pleural effusion. No other focal infiltrates.  Visualized liver within normal  limits. Gallbladder is contracted but otherwise unremarkable. The spleen, adrenal glands, and pancreas are within normal limits.  Stomach is unremarkable.  Visualized kidneys are within normal limits.  A right-sided rectus sheath hematoma measuring 17.8 x 6.3 cm partially visualized within the anterior right abdomen (series 5, image 114). There is are a few small blushes of active contrast extravasation in the body of the hematoma, possibly from the right superior epigastric artery (series 5, image 110, 111).  IMPRESSION: 1. 17.8 x 6.3 cm hematoma within the right rectus abdominus sheath, incompletely visualized. There is a small blush of active contrast extravasation within this hematoma, likely arising from the right superior epigastric artery (or its branches). Follow-up examination with dedicated imaging of the remaining abdomen and pelvis to completely characterize this hematoma could be considered. 2. 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 This is favored to be chronic in nature, and may be related to a chronic penetrating atherosclerotic ulcer. No contrast extravasation or associated inflammation. No other acute abnormality identified within the visualized thoracic and abdominal aorta. 3. Focal patchy and nodular opacities within the posterior right lower lobe, which may reflect focal pneumonia or possibly sequelae of aspiration. 4. 1.4 cm right hilar lymph node, indeterminate, but may be reactive in nature. Critical Value/emergent results were called by telephone at the time of interpretation on 08/08/2014 at 4:55 am to Dr. Elyn Peers , who verbally acknowledged these results.   Electronically Signed   By: Jeannine Boga M.D.   On: 08/08/2014 04:54   Dg Chest Portable 1 View  08/08/2014   CLINICAL DATA:  Bleeding and bruising.  Abdominal pain  EXAM: PORTABLE CHEST - 1 VIEW  COMPARISON:  04/29/2012  FINDINGS: No cardiomegaly.  Negative upper mediastinal contours. Patient is status post CABG.  There is no edema, consolidation, effusion, or pneumothorax.  IMPRESSION: No active disease.   Electronically Signed   By: Jorje Guild M.D.   On: 08/08/2014 03:22   Dg Abd Portable 1v  08/08/2014   CLINICAL DATA:  Abdominal pain  EXAM: PORTABLE ABDOMEN -  1 VIEW  COMPARISON:  12/14/2003  FINDINGS: The upper abdomen is excluded from view. The majority of the bowel gas is visualized however (excluding the stomach) and is nonobstructive. There is no concerning intra-abdominal mass effect or calcification. Lower lumbar facet osteoarthritis.  IMPRESSION: Nonobstructive bowel gas pattern.   Electronically Signed   By: Jorje Guild M.D.   On: 08/08/2014 03:15        Scheduled Meds: . atenolol  50 mg Oral Daily  . atorvastatin  40 mg Oral Daily  . azithromycin  500 mg Intravenous Q24H  . cefTRIAXone (ROCEPHIN)  IV  1 g Intravenous Q24H  . ezetimibe  10 mg Oral Daily  . insulin aspart  0-5 Units Subcutaneous QHS  . insulin aspart  0-9 Units Subcutaneous TID WC  . lisinopril  10 mg Oral Daily  . pantoprazole  80 mg Oral Daily  . [START ON 08/10/2014] pneumococcal 23 valent vaccine  0.5 mL Intramuscular Tomorrow-1000   Continuous Infusions: . sodium chloride 75 mL/hr (08/09/14 1015)    Principal Problem:   Rectus sheath hematoma Active Problems:   Hypertension   Hyperlipidemia   CAP (community acquired pneumonia)    Time spent: 30 minutes.    Vernell Leep, MD, FACP, FHM. [facility] Pager [phone]  If 7PM-7AM, please contact night-coverage [email or web address] Password [email or web address] 08/09/2014, 4:08 PM    LOS: 1 day

## 2014-08-09 NOTE — Plan of Care (Signed)
Problem: Phase I Progression Outcomes Goal: OOB as tolerated unless otherwise ordered Outcome: Completed/Met Date Met:  08/09/14 Pt ambulatory in hall to RN station and back, approx 200 ft, ambulatory in room to bathroom.  Goal: Code status addressed with pt/family Outcome: Completed/Met Date Met:  08/09/14 Pt states is outpatient DNR, not sure if it remains active. Placed SW consult for advance directives assistance.

## 2014-08-10 DIAGNOSIS — F17200 Nicotine dependence, unspecified, uncomplicated: Secondary | ICD-10-CM

## 2014-08-10 DIAGNOSIS — E119 Type 2 diabetes mellitus without complications: Secondary | ICD-10-CM

## 2014-08-10 DIAGNOSIS — Z5189 Encounter for other specified aftercare: Secondary | ICD-10-CM

## 2014-08-10 LAB — BASIC METABOLIC PANEL
Anion gap: 9 (ref 5–15)
BUN: 8 mg/dL (ref 6–23)
CO2: 28 mEq/L (ref 19–32)
Calcium: 8.7 mg/dL (ref 8.4–10.5)
Chloride: 96 mEq/L (ref 96–112)
Creatinine, Ser: 0.92 mg/dL (ref 0.50–1.35)
GFR calc Af Amer: >90 mL/min (ref >90)
GFR calc non Af Amer: 90 mL/min — ABNORMAL LOW (ref >90)
Glucose, Bld: 130 mg/dL — ABNORMAL HIGH (ref 70–99)
Potassium: 4.6 mEq/L (ref 3.7–5.3)
Sodium: 133 mEq/L — ABNORMAL LOW (ref 137–147)

## 2014-08-10 LAB — CBC
HCT: 32 % — ABNORMAL LOW (ref 39.0–52.0)
Hemoglobin: 11.2 g/dL — ABNORMAL LOW (ref 13.0–17.0)
MCH: 29.9 pg (ref 26.0–34.0)
MCHC: 35 g/dL (ref 30.0–36.0)
MCV: 85.6 fL (ref 78.0–100.0)
Platelets: 216 10*3/uL (ref 150–400)
RBC: 3.74 MIL/uL — ABNORMAL LOW (ref 4.22–5.81)
RDW: 13.2 % (ref 11.5–15.5)
WBC: 5.5 10*3/uL (ref 4.0–10.5)

## 2014-08-10 MED ORDER — ACETAMINOPHEN 325 MG PO TABS: 650.0000 mg | ORAL_TABLET | Freq: Four times a day (QID) | ORAL | Status: DC | PRN

## 2014-08-10 MED ORDER — LEVOFLOXACIN 750 MG PO TABS: 750.0000 mg | ORAL_TABLET | Freq: Every day | ORAL | Status: DC

## 2014-08-10 MED ORDER — BENZONATATE 200 MG PO CAPS: 200.0000 mg | ORAL_CAPSULE | Freq: Three times a day (TID) | ORAL | Status: DC | PRN

## 2014-08-10 MED ORDER — LEVOFLOXACIN 750 MG PO TABS
750.0000 mg | ORAL_TABLET | Freq: Every day | ORAL | Status: DC
  Administered 2014-08-10: 750 mg via ORAL
  Filled 2014-08-10: qty 1

## 2014-08-10 NOTE — Progress Notes (Signed)
Patient Discharge:  Patient discharged to home.  No c/o pain or any discomfort.  Hemodynamically stable. Education: Patient educated on follow-up appointments, prescriptions, medications, and also given hand outs on pneumonia, tobacco smoking.  Given hand out on Pneumonia vaccination. IV: Peripheral IV removed before discharge. Telemetry: Not applicable. Transportation: Patient was accompanied by friend.  Belongings: Patient took all his belongings with him.

## 2014-08-10 NOTE — Progress Notes (Signed)
Nutrition Brief Note  Patient identified on the Malnutrition Screening Tool (MST) Report  Wt Readings from Last 15 Encounters:  08/09/14 272 lb 11.2 oz (123.696 kg)  06/15/14 278 lb (126.1 kg)  12/25/13 270 lb (122.471 kg)  08/20/12 254 lb 6.4 oz (115.395 kg)  06/28/12 251 lb (113.853 kg)  06/18/12 251 lb (113.853 kg)  05/24/12 245 lb (111.131 kg)  05/02/12 248 lb 0.3 oz (112.5 kg)  04/29/12 240 lb (108.863 kg)  04/26/12 247 lb (112.038 kg)  04/09/12 247 lb 4.8 oz (112.175 kg)  04/09/12 247 lb 4.8 oz (112.175 kg)  03/29/12 254 lb 6.6 oz (115.4 kg)  03/28/12 250 lb (113.399 kg)  03/22/12 251 lb (113.853 kg)    Body mass index is 34.09 kg/(m^2). Patient meets criteria for class I obesity based on current BMI.   Current diet order is Carbohydrate modified, patient is consuming approximately 100% of meals at this time. Labs and medications reviewed.   No nutrition interventions warranted at this time. If nutrition issues arise, please consult RD.   Kallie Locks, MS, Provisional LDN Pager # [phone] After hours/ weekend pager # [phone]

## 2014-08-10 NOTE — Discharge Instructions (Signed)

## 2014-08-10 NOTE — Progress Notes (Signed)
Anna Livers Barnett RD, LDN Inpatient Clinical Dietitian Pager: [phone] After Hours Pager: [phone]  

## 2014-08-10 NOTE — Discharge Summary (Signed)
Physician Discharge Summary  Curtis Perez:[id number] DOB: 1951/11/19 DOA: 08/08/2014  PCP: Alesia Richards, MD  Admit date: 08/08/2014 Discharge date: 08/10/2014  Time spent: Less than 30 minutes  Recommendations for Outpatient Follow-up:  1. Dr. Jacalyn Lefevre, PCP in 4 days with repeat labs (CBC & BMP). Please follow final blood culture results that were drawn in the hospital. 2. Consider repeating CT chest with contrast in 4-6 weeks to followup on pneumonia (CXR was negative) and pulmonary nodule.  Discharge Diagnoses:  Principal Problem:   Rectus sheath hematoma Active Problems:   Hypertension   Hyperlipidemia   CAP (community acquired pneumonia)   Discharge Condition: Improved & Stable  Diet recommendation: Heart healthy and diabetic diet.  Filed Weights   08/08/14 0644 08/08/14 2155 08/09/14 2031  Weight: 123.923 kg (273 lb 3.2 oz) 123.696 kg (272 lb 11.2 oz) 123.696 kg (272 lb 11.2 oz)    History of present illness:  63 year old male with history of hypertension, hyperlipidemia, CAD, CABG, GERD, ongoing nonproductive cough without fever or chills for the last 2-4 weeks, presented to the ED with severe RUQ abdominal pain and swelling, following a bout of coughing. Denied fever or chills. In the ED, found to have a rectus sheath hematoma.  Hospital Course:   1. Right Rectus sheath hematoma: Secondary to coughing spells. Only on aspirin at home- currently held- resume OP when appropriate. General surgery evaluated and recommend supportive treatment. Symptomatically better. Some drop in Hb initially but stabilized. Pain controlled with Tylenol. 2. RLL Community acquired pneumonia: Treated initially with IV Rocephin and azithromycin. Switched to by mouth levofloxacin at discharge and to complete total 7 days treatment. Consider followup CT in 4-6 weeks to follow PNA and Lung nodule. 3. Acute post hemorrhagic anemia: Hemoglobin dropped from 13.2 > 10.9>11.3>10.  Secondary to problem #1. Hemoglobin has stabilized in the 11 g per DL range.  4. Hypertension: Controlled. Continue lisinopril and atenolol. 5. History of hyperlipidemia: apparently muscle cramps from statins and has not taken for 3 months- DC. 6. History of prediabetes: Held metformin since he received IV contrast early am on 8/22. A1c: 6.7. Resume metformin at discharge. 7. Hyponatremia: Urine and serum osmolarity not consistent with SIADH.? Intravascular volume depletion. Treated with IV normal saline and improved to 133. Followup BMP as outpatient in a couple of days. 8. 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 Reviewed case and imaging with vascular surgeon on call on 8/22- recommended no further w/u or fu. 9. Right hilar LN: seen on CT. Consider OP follow up imaging given history of tobacco abuse. 10. Tobacco abuse: States that he's been smoking 2 packs of cigarettes per day. Declines a nicotine patch. Tobacco cessation counseled.   Consultations:  None  Procedures:  None    Discharge Exam:  Complaints:  Right upper quadrant pain and swelling improved. No new complaints. Cough minimal and dry. Denies dyspnea.  Filed Vitals:   08/09/14 2031 08/10/14 0546 08/10/14 0924 08/10/14 1014  BP: 121/59 112/60 100/46 110/68  Pulse: 52 57 59   Temp: 98.8 F (37.1 C) 98 F (36.7 C) 98.3 F (36.8 C)   TempSrc: Oral Oral Oral   Resp: 18 18 19    Height: 6\' 3"  (1.905 m)     Weight: 123.696 kg (272 lb 11.2 oz)     SpO2: 98% 98% 97%    General exam: Pleasant middle-aged male lying comfortably in bed.  Respiratory system:  Occasional basal crackles but otherwise clear to auscultation. No increased work of breathing.  Cardiovascular system: S1 & S2 heard, RRR. No JVD, murmurs, gallops, clicks or pedal edema.  Gastrointestinal system: Firm tender area over RUQ without fluctuance or bruit. No peritoneal  signs. Normal bowel sounds heard.  Central nervous system: Alert and oriented. No focal neurological deficits.  Extremities: Symmetric 5 x 5 power.   Discharge Instructions      Discharge Instructions   Call MD for:  severe uncontrolled pain    Complete by:  As directed      Call MD for:  temperature >100.4    Complete by:  As directed      Diet - low sodium heart healthy    Complete by:  As directed      Diet Carb Modified    Complete by:  As directed      Increase activity slowly    Complete by:  As directed             Medication List    STOP taking these medications       aspirin 325 MG tablet      TAKE these medications       acetaminophen 325 MG tablet  Commonly known as:  TYLENOL  Take 2 tablets (650 mg total) by mouth every 6 (six) hours as needed for mild pain, moderate pain, fever or headache.     atenolol 50 MG tablet  Commonly known as:  TENORMIN  Take 50 mg by mouth daily.     benzonatate 200 MG capsule  Commonly known as:  TESSALON  Take 1 capsule (200 mg total) by mouth 3 (three) times daily as needed for cough.     ezetimibe 10 MG tablet  Commonly known as:  ZETIA  Take 10 mg by mouth daily.     FISH OIL PO  Take 2,000 mg by mouth daily.     levofloxacin 750 MG tablet  Commonly known as:  LEVAQUIN  Take 1 tablet (750 mg total) by mouth daily.  Start taking on:  08/11/2014     lisinopril 10 MG tablet  Commonly known as:  PRINIVIL,ZESTRIL  Take 10 mg by mouth daily.     Magnesium 400 MG Caps  Take 400 mg by mouth. Takes 4 per day     metFORMIN 500 MG 24 hr tablet  Commonly known as:  GLUCOPHAGE-XR  Take 500-1,000 mg by mouth 3 (three) times daily. Take 1 tablet (500mg ) with breakfast and lunch and 2 tablets (1000mg ) at supper     omeprazole 40 MG capsule  Commonly known as:  PRILOSEC  Take 40 mg by mouth 2 (two) times daily.     VITAMIN B-12 PO  Take 1,000 mcg by mouth daily.     Vitamin D (Ergocalciferol) 50000 UNITS Caps  capsule  Commonly known as:  DRISDOL  Take 50,000 Units by mouth 3 (three) times a week.       Follow-up Information   Follow up with MCKEOWN,WILLIAM DAVID, MD. Schedule an appointment as soon as possible for a visit in 4 days. (To be seen with repeat labs (CBC & BMP).)    Specialty:  Internal Medicine   Contact information:   [address] [place] [address] [phone]        The results of significant diagnostics from this hospitalization (including imaging, microbiology, ancillary and laboratory) are listed below for reference.    Significant Diagnostic Studies: Ct Angio  Chest W/cm &/or Wo Cm  08/08/2014   CLINICAL DATA:  BLEEDING/BRUISING ABDOMINAL PAIN  EXAM: CT ANGIOGRAPHY CHEST WITH CONTRAST  TECHNIQUE: Multidetector CT imaging of the chest was performed using the standard protocol during bolus administration of intravenous contrast. Multiplanar CT image reconstructions and MIPs were obtained to evaluate the vascular anatomy.  CONTRAST:  150mL OMNIPAQUE IOHEXOL 350 MG/ML SOLN  COMPARISON:  Prior radiograph earlier the same day  FINDINGS: The thyroid gland is within normal limits.  No pathologically enlarged mediastinal hilar adenopathy. There is a 1.4 cm right hilar lymph node, indeterminate. No axillary adenopathy.  Intrathoracic aorta is of normal caliber without evidence of aneurysm. No dissection or other acute abnormality seen. In the abdomen, a small linear filling defect is seen within the left lateral aspect of the intra-abdominal aorta at the level of the SMA, suggestive of a small dissection flap (series 5, image 117). This is best seen on sagittal sequence (series 9, image 104). This is likely chronic in nature. Both lumens are well opacified, and there are no acute inflammatory changes in this region. The celiac axis and SMA are well opacified. There are 2 left renal arteries with single right renal artery, all of which are opacified.  Sequelae of prior  median sternotomy noted. Heart size is within normal limits. No pericardial effusion.  Main pulmonary artery within normal limits for diameter. No definite abnormality seen within the pulmonary arteries. Please note that this study is not optimized for evaluation for pulmonary embolism.  Scattered nodular and patchy opacity or present within the posterior right lower lobe, which may reflect infiltrate or possibly sequelae of aspiration. Mild bronchiectatic changes seen within this region. Lungs are otherwise clear without pulmonary edema or pleural effusion. No other focal infiltrates.  Visualized liver within normal limits. Gallbladder is contracted but otherwise unremarkable. The spleen, adrenal glands, and pancreas are within normal limits.  Stomach is unremarkable.  Visualized kidneys are within normal limits.  A right-sided rectus sheath hematoma measuring 17.8 x 6.3 cm partially visualized within the anterior right abdomen (series 5, image 114). There is are a few small blushes of active contrast extravasation in the body of the hematoma, possibly from the right superior epigastric artery (series 5, image 110, 111).  IMPRESSION: 1. 17.8 x 6.3 cm hematoma within the right rectus abdominus sheath, incompletely visualized. There is a small blush of active contrast extravasation within this hematoma, likely arising from the right superior epigastric artery (or its branches). Follow-up examination with dedicated imaging of the remaining abdomen and pelvis to completely characterize this hematoma could be considered. 2. 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 This is favored to be chronic in nature, and may be related to a chronic penetrating atherosclerotic ulcer. No contrast extravasation or associated inflammation. No other acute abnormality identified within the visualized thoracic and abdominal aorta. 3. Focal patchy and nodular opacities  within the posterior right lower lobe, which may reflect focal pneumonia or possibly sequelae of aspiration. 4. 1.4 cm right hilar lymph node, indeterminate, but may be reactive in nature. Critical Value/emergent results were called by telephone at the time of interpretation on 08/08/2014 at 4:55 am to Dr. Elyn Peers , who verbally acknowledged these results.   Electronically Signed   By: Jeannine Boga M.D.   On: 08/08/2014 04:54   Dg Chest Portable 1 View  08/08/2014   CLINICAL DATA:  Bleeding and bruising.  Abdominal pain  EXAM:  PORTABLE CHEST - 1 VIEW  COMPARISON:  04/29/2012  FINDINGS: No cardiomegaly. Negative upper mediastinal contours. Patient is status post CABG.  There is no edema, consolidation, effusion, or pneumothorax.  IMPRESSION: No active disease.   Electronically Signed   By: Jorje Guild M.D.   On: 08/08/2014 03:22   Dg Abd Portable 1v  08/08/2014   CLINICAL DATA:  Abdominal pain  EXAM: PORTABLE ABDOMEN - 1 VIEW  COMPARISON:  12/14/2003  FINDINGS: The upper abdomen is excluded from view. The majority of the bowel gas is visualized however (excluding the stomach) and is nonobstructive. There is no concerning intra-abdominal mass effect or calcification. Lower lumbar facet osteoarthritis.  IMPRESSION: Nonobstructive bowel gas pattern.   Electronically Signed   By: Jorje Guild M.D.   On: 08/08/2014 03:15    Microbiology: Recent Results (from the past 240 hour(s))  CULTURE, BLOOD (ROUTINE X 2)     Status: None   Collection Time    08/08/14  7:14 AM      Result Value Ref Range Status   Specimen Description BLOOD RIGHT HAND   Final   Special Requests BOTTLES DRAWN AEROBIC AND ANAEROBIC 10CC   Final   Culture  Setup Time     Final   Value: 08/08/2014 20:00     Performed at [facility]   Culture     Final   Value:        BLOOD CULTURE RECEIVED NO GROWTH TO DATE CULTURE WILL BE HELD FOR 5 DAYS BEFORE ISSUING A FINAL NEGATIVE REPORT     Performed at [facility]   Report Status PENDING   Incomplete  CULTURE, BLOOD (ROUTINE X 2)     Status: None   Collection Time    08/08/14  7:22 AM      Result Value Ref Range Status   Specimen Description BLOOD LEFT HAND   Final   Special Requests BOTTLES DRAWN AEROBIC ONLY 5CC   Final   Culture  Setup Time     Final   Value: 08/08/2014 20:00     Performed at [facility]   Culture     Final   Value:        BLOOD CULTURE RECEIVED NO GROWTH TO DATE CULTURE WILL BE HELD FOR 5 DAYS BEFORE ISSUING A FINAL NEGATIVE REPORT     Performed at [facility]   Report Status PENDING   Incomplete     Labs: Basic Metabolic Panel:  Recent Labs Lab 08/08/14 0304 08/08/14 0308 08/08/14 1640 08/09/14 0500 08/09/14 1908 08/10/14 0517  NA 130* 128* 123* 127* 127* 133*  K 4.3 4.1 4.8 4.3 5.0 4.6  CL 93* 94* 88* 93* 91* 96  CO2 22  --  22 22 27 28   GLUCOSE 184* 192* 130* 149* 142* 130*  BUN 8 7 7 9 9 8   CREATININE 0.93 1.00 0.78 0.82 0.94 0.92  CALCIUM 8.9  --  8.8 8.7 8.5 8.7   Liver Function Tests:  Recent Labs Lab 08/08/14 0304  AST 13  ALT 11  ALKPHOS 101  BILITOT 0.6  PROT 6.0  ALBUMIN 3.2*   No results found for this basename: LIPASE, AMYLASE,  in the last 168 hours No results found for this basename: AMMONIA,  in the last 168 hours CBC:  Recent Labs Lab 08/08/14 0304  08/08/14 1225 08/08/14 1640 08/09/14 0500 08/09/14 1908 08/10/14 0517  WBC 10.1  --   --  10.2 7.1 7.3  5.5  NEUTROABS 8.7*  --   --   --   --   --   --   HGB 13.2  < > 10.9* 11.3* 10.0* 11.0* 11.2*  HCT 37.1*  < > 30.4* 31.7* 28.6* 30.8* 32.0*  MCV 84.3  --   --  82.8 82.9 83.7 85.6  PLT 208  --   --  219 178 206 216  < > = values in this interval not displayed. Cardiac Enzymes: No results found for this basename: CKTOTAL, CKMB, CKMBINDEX, TROPONINI,  in the last 168 hours BNP: BNP (last 3 results) No results found for this basename: PROBNP,  in the last 8760 hours CBG:  Recent Labs Lab  08/08/14 2149 08/09/14 0750 08/09/14 1150 08/09/14 1632 08/09/14 2035  GLUCAP 124* 114* 107* 101* 106*    Signed:  Vernell Leep, MD, FACP, FHM. [facility] Pager [phone]  If 7PM-7AM, please contact night-coverage [email or web address] Password [email or web address] 08/10/2014, 1:07 PM

## 2014-08-13 LAB — GLUCOSE, CAPILLARY
Glucose-Capillary: 122 mg/dL — ABNORMAL HIGH (ref 70–99)
Glucose-Capillary: 105 mg/dL — ABNORMAL HIGH (ref 70–99)

## 2014-08-14 ENCOUNTER — Ambulatory Visit (INDEPENDENT_AMBULATORY_CARE_PROVIDER_SITE_OTHER): Payer: No Typology Code available for payment source | Admitting: Internal Medicine

## 2014-08-14 ENCOUNTER — Encounter: Payer: Self-pay | Admitting: Internal Medicine

## 2014-08-14 VITALS — BP 124/72 | HR 76 | Temp 97.0°F | Resp 18 | Ht 74.25 in | Wt 274.6 lb

## 2014-08-14 DIAGNOSIS — J189 Pneumonia, unspecified organism: Secondary | ICD-10-CM

## 2014-08-14 DIAGNOSIS — Z79899 Other long term (current) drug therapy: Secondary | ICD-10-CM

## 2014-08-14 DIAGNOSIS — E871 Hypo-osmolality and hyponatremia: Secondary | ICD-10-CM

## 2014-08-14 LAB — CBC WITH DIFFERENTIAL/PLATELET
Basophils Absolute: 0 10*3/uL (ref 0.0–0.1)
Basophils Relative: 0 % (ref 0–1)
Eosinophils Absolute: 0.2 10*3/uL (ref 0.0–0.7)
Eosinophils Relative: 2 % (ref 0–5)
HCT: 36.3 % — ABNORMAL LOW (ref 39.0–52.0)
Hemoglobin: 12.6 g/dL — ABNORMAL LOW (ref 13.0–17.0)
Lymphocytes Relative: 13 % (ref 12–46)
Lymphs Abs: 1.3 10*3/uL (ref 0.7–4.0)
MCH: 29.3 pg (ref 26.0–34.0)
MCHC: 34.7 g/dL (ref 30.0–36.0)
MCV: 84.4 fL (ref 78.0–100.0)
Monocytes Absolute: 0.5 10*3/uL (ref 0.1–1.0)
Monocytes Relative: 5 % (ref 3–12)
Neutro Abs: 8 10*3/uL — ABNORMAL HIGH (ref 1.7–7.7)
Neutrophils Relative %: 80 % — ABNORMAL HIGH (ref 43–77)
Platelets: 375 10*3/uL (ref 150–400)
RBC: 4.3 MIL/uL (ref 4.22–5.81)
RDW: 14.3 % (ref 11.5–15.5)
WBC: 10 10*3/uL (ref 4.0–10.5)

## 2014-08-14 LAB — CULTURE, BLOOD (ROUTINE X 2)
Culture: NO GROWTH
Culture: NO GROWTH

## 2014-08-14 MED ORDER — LEVOFLOXACIN 500 MG PO TABS: 500.0000 mg | ORAL_TABLET | Freq: Every day | ORAL | Status: AC

## 2014-08-15 LAB — BASIC METABOLIC PANEL WITH GFR
BUN: 11 mg/dL (ref 6–23)
CO2: 26 mEq/L (ref 19–32)
Calcium: 9.3 mg/dL (ref 8.4–10.5)
Chloride: 98 mEq/L (ref 96–112)
Creat: 1.03 mg/dL (ref 0.50–1.35)
GFR, Est African American: >89 mL/min
GFR, Est Non African American: 79 mL/min
Glucose, Bld: 138 mg/dL — ABNORMAL HIGH (ref 70–99)
Potassium: 4.5 mEq/L (ref 3.5–5.3)
Sodium: 132 mEq/L — ABNORMAL LOW (ref 135–145)

## 2014-08-16 ENCOUNTER — Encounter: Payer: Self-pay | Admitting: Internal Medicine

## 2014-08-16 NOTE — Progress Notes (Signed)
Subjective:    Patient ID: Curtis Perez, male    DOB: September 07, 1951, 63 y.o.   MRN: [id number]  HPI Patient was just hospitalized Aug22-24 with a CAP and had an extnsive rectus sheath hematoma from forceful coughing. Patient was d/c'd on 3 more days of Levaquin  750 mg to complete a total of 5 days, but he persist with chills, ? fever and a minimally productive cough.    Medication List   acetaminophen 325 MG tablet  Commonly known as:  TYLENOL  Take 2 tablets (650 mg total) by mouth every 6 (six) hours as needed for mild pain, moderate pain, fever or headache.     atenolol 50 MG tablet  Commonly known as:  TENORMIN  Take 50 mg by mouth daily.     benzonatate 200 MG capsule  Commonly known as:  TESSALON  Take 1 capsule (200 mg total) by mouth 3 (three) times daily as needed for cough.     ezetimibe 10 MG tablet  Commonly known as:  ZETIA  Take 10 mg by mouth daily.     FISH OIL PO  Take 2,000 mg by mouth daily.     levofloxacin 500 MG tablet  Commonly known as:  LEVAQUIN  Take 1 tablet (500 mg total) by mouth daily.     lisinopril 10 MG tablet  Commonly known as:  PRINIVIL,ZESTRIL  Take 10 mg by mouth daily.     Magnesium 400 MG Caps  Take 400 mg by mouth. Takes 4 per day     metFORMIN 500 MG 24 hr tablet  Commonly known as:  GLUCOPHAGE-XR  Take 500-1,000 mg by mouth 3 (three) times daily. Take 1 tablet (500mg ) with breakfast and lunch and 2 tablets (1000mg ) at supper     omeprazole 40 MG capsule  Commonly known as:  PRILOSEC  Take 40 mg by mouth 2 (two) times daily.     VITAMIN B-12 PO  Take 1,000 mcg by mouth daily.     Vitamin D (Ergocalciferol) 50000 UNITS Caps capsule  Commonly known as:  DRISDOL  Take 50,000 Units by mouth 3 (three) times a week.     No Known Allergies  Past Medical History  Diagnosis Date  . Hypertension   . Hyperlipidemia   . Bradycardia   . Myocardial infarction     2004  . Tobacco abuse 03/28/2012  . 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 CAD (coronary artery disease)     remote MI in 2004 with PCI; s/p CABG x 3 03/2012  . Adenomatous colon polyp 02/15/2009  . Family history of malignant neoplasm of gastrointestinal tract   . Prediabetes   . GERD (gastroesophageal reflux disease)   . Vitamin D deficiency   . Obesity    Past Surgical History  Procedure Laterality Date  . Tonsillectomy    . Knee surgery    . Back surgery    . Coronary angioplasty with stent placement      about 8 years ago at [facility]  . Colonoscopy  02/15/2009  . Coronary artery bypass graft  04/02/2012    Procedure: CORONARY ARTERY BYPASS GRAFTING (CABG);  Surgeon: Rexene Alberts, MD;  Location: [facility];  Service: Open Heart Surgery;  Laterality: N/A;  On pump, times three graphs using endoscopically harvested right greater saphenous vein and left internal mammary artery.    Review of  Systems In addition to the HPI above,   No Headache, No changes with Vision or hearing,  No problems swallowing food or Liquids,  Sl productive Cough and no Shortness of Breath,  No Abdominal pain, No Nausea or Vomitting, Bowel movements are regular,  No Blood in stool or Urine,  No dysuria,  No new skin rashes or bruises,  No new joints pains-aches,  No new weakness, tingling, numbness in any extremity,  No recent weight loss,  No polyuria, polydypsia or polyphagia,   Objective:   Physical Exam   BP 124/72  P 76  T 97 F   Resp 18  Ht 6' 2.25"   Wt 274 lb 9.6 oz   BMI 35.02 HEENT - Eac's patent. TM's Nl. EOM's full. PERRLA. NasoOroPharynx clear. Neck - supple. Nl Thyroid. Carotids 2+ & No bruits, nodes, JVD Chest - CFew scattered Rales and no  rhonchi, wheezes. Cor - Nl HS. RRR w/o sig MGR. PP 1(+). No edema. Abd - No palpable organomegaly, masses or tenderness. BS nl. MS- FROM w/o deformities. Muscle power, tone and bulk Nl. Gait Nl. Neuro - No obvious Cr N abnormalities. Sensory,  motor and Cerebellar functions appear Nl w/o focal abnormalities. Psyche - Mental status normal & appropriate.  No delusions, ideations or obvious mood abnormalities.  Assessment & Plan:   1. CAP (community acquired pneumonia) - Rx Levaquin 500 mg #10 1 daily  2. Hyponatremia (in hospital) - BASIC METABOLIC PANEL WITH GFR  3. Encounter for long-term (current) use of other medications - CBC with Differential

## 2014-08-21 ENCOUNTER — Other Ambulatory Visit: Payer: Self-pay | Admitting: Internal Medicine

## 2014-08-27 ENCOUNTER — Telehealth: Payer: Self-pay | Admitting: Internal Medicine

## 2014-08-27 MED ORDER — ATENOLOL 50 MG PO TABS: 50.0000 mg | ORAL_TABLET | Freq: Every day | ORAL | Status: DC

## 2014-08-27 NOTE — Telephone Encounter (Signed)
PT CALLED REQUESTING RENEWAL ON atenolol (TENORMIN) 50 MG tablet [[id number]]  Thank you, Katrina Judeth Horn [facility] [phone] Fax [phone]

## 2014-09-14 ENCOUNTER — Ambulatory Visit (INDEPENDENT_AMBULATORY_CARE_PROVIDER_SITE_OTHER): Payer: No Typology Code available for payment source | Admitting: Physician Assistant

## 2014-09-14 ENCOUNTER — Encounter: Payer: Self-pay | Admitting: Physician Assistant

## 2014-09-14 VITALS — BP 122/70 | HR 60 | Temp 97.7°F | Resp 16 | Ht 74.25 in | Wt 269.0 lb

## 2014-09-14 DIAGNOSIS — Z79899 Other long term (current) drug therapy: Secondary | ICD-10-CM

## 2014-09-14 DIAGNOSIS — F1999 Other psychoactive substance use, unspecified with unspecified psychoactive substance-induced disorder: Secondary | ICD-10-CM

## 2014-09-14 DIAGNOSIS — E871 Hypo-osmolality and hyponatremia: Secondary | ICD-10-CM

## 2014-09-14 DIAGNOSIS — E782 Mixed hyperlipidemia: Secondary | ICD-10-CM

## 2014-09-14 DIAGNOSIS — R911 Solitary pulmonary nodule: Secondary | ICD-10-CM

## 2014-09-14 DIAGNOSIS — I10 Essential (primary) hypertension: Secondary | ICD-10-CM

## 2014-09-14 DIAGNOSIS — I2510 Atherosclerotic heart disease of native coronary artery without angina pectoris: Secondary | ICD-10-CM

## 2014-09-14 DIAGNOSIS — E559 Vitamin D deficiency, unspecified: Secondary | ICD-10-CM

## 2014-09-14 DIAGNOSIS — F17219 Nicotine dependence, cigarettes, with unspecified nicotine-induced disorders: Secondary | ICD-10-CM

## 2014-09-14 NOTE — Progress Notes (Signed)
Assessment and Plan:  Hypertension: Continue medication, monitor blood pressure at home. Continue DASH diet. Cholesterol: Continue diet and exercise. Check cholesterol.  Diabetes-Continue diet and exercise. Check A1C Vitamin D Def- check level and continue medications.  Repeat CT chest with contrast for lung nodules.  Hyponatremia- recheck BMP Smoking cessation- discussed with patient, understands risk of MI, stroke, cancer, and death.    Continue diet and meds as discussed. Further disposition pending results of labs. Discussed med's effects and SE's.    HPI 63 y.o. male  presents for 3 month follow up with hypertension, hyperlipidemia, diabetes and vitamin D. His blood pressure has been controlled at home, today their BP is BP: 122/70 mmHg He does not workout. He denies chest pain, shortness of breath, dizziness.  He is on cholesterol medication, zetia and denies myalgias since off statin. His cholesterol is at goal. The cholesterol last visit was:   Lab Results  Component Value Date   CHOL 108 06/15/2014   HDL 48 06/15/2014   LDLCALC 49 06/15/2014   TRIG 56 06/15/2014   CHOLHDL 2.3 06/15/2014   He has been working on diet and exercise for Diabetes, CKD stage II, he is on metformin which was just started 3 months ago and on ACE, and denies paresthesia of the feet, polydipsia and polyuria. Last A1C in the office was:  Lab Results  Component Value Date   HGBA1C 6.7* 08/08/2014   Patient is on Vitamin D supplement. Lab Results  Component Value Date   VD25OH 77 06/15/2014     He was recently admitted for CAP and rectus sheath hematoma from coughing, he was treated with ABX and states he still has cough but breathing is better. His kidney function was up some and his sodium was low. He is suppose to have a repeat CT scan in 4-6 weeks but he would like to wait a month  Lab Results  Component Value Date   CREATININE 1.03 08/14/2014   BUN 11 08/14/2014   NA 132* 08/14/2014   K 4.5 08/14/2014    CL 98 08/14/2014   CO2 26 08/14/2014   Lab Results  Component Value Date   WBC 10.0 08/14/2014   HGB 12.6* 08/14/2014   HCT 36.3* 08/14/2014   MCV 84.4 08/14/2014   PLT 375 08/14/2014     Current Medications:  Current Outpatient Prescriptions on File Prior to Visit  Medication Sig Dispense Refill  . atenolol (TENORMIN) 50 MG tablet Take 1 tablet (50 mg total) by mouth daily.  30 tablet  3  . Cyanocobalamin (VITAMIN B-12 PO) Take 1,000 mcg by mouth daily.       Marland Kitchen lisinopril (PRINIVIL,ZESTRIL) 10 MG tablet Take 10 mg by mouth daily.      . Magnesium 400 MG CAPS Take 400 mg by mouth. Takes 4 per day      . metFORMIN (GLUCOPHAGE-XR) 500 MG 24 hr tablet Take 500-1,000 mg by mouth 3 (three) times daily. Take 1 tablet (500mg ) with breakfast and lunch and 2 tablets (1000mg ) at supper      . Omega-3 Fatty Acids (FISH OIL PO) Take 2,000 mg by mouth daily.      Marland Kitchen omeprazole (PRILOSEC) 40 MG capsule Take 40 mg by mouth 2 (two) times daily.      . Vitamin D, Ergocalciferol, (DRISDOL) 50000 UNITS CAPS capsule Take 50,000 Units by mouth 3 (three) times a week.      Marland Kitchen ZETIA 10 MG tablet TAKE 1 TABLET BY MOUTH EVERY DAY  FOR CHOLESTEROL  30 tablet  10   No current facility-administered medications on file prior to visit.   Medical History:  Past Medical History  Diagnosis Date  . Hypertension   . Hyperlipidemia   . Bradycardia   . Myocardial infarction     2004  . Tobacco abuse 03/28/2012  . 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 CAD (coronary artery disease)     remote MI in 2004 with PCI; s/p CABG x 3 03/2012  . Adenomatous colon polyp 02/15/2009  . Family history of malignant neoplasm of gastrointestinal tract   . Prediabetes   . GERD (gastroesophageal reflux disease)   . Vitamin D deficiency   . Obesity    Allergies: No Known Allergies   Review of Systems: [X]  = complains of  [ ]  = denies  General: Fatigue [ ]  Fever [ ]  Chills [ ]   Weakness [ ]   Insomnia [ ]  Eyes: Redness [ ]  Blurred vision [ ]  Diplopia [ ]   ENT: Congestion [ ]  Sinus Pain [ ]  Post Nasal Drip [ ]  Sore Throat [ ]  Earache [ ]   Cardiac: Chest pain/pressure [ ]  SOB [ ]  Orthopnea [ ]   Palpitations [ ]   Paroxysmal nocturnal dyspnea[ ]  Claudication [ ]  Edema [ ]   Pulmonary: Cough [ ]  Wheezing[ ]   SOB [ ]   Snoring [ ]   GI: Nausea [ ]  Vomiting[ ]  Dysphagia[ ]  Heartburn[ ]  Abdominal pain [ ]  Constipation [ ] ; Diarrhea [ ] ; BRBPR [ ]  Melena[ ]  GU: Hematuria[ ]  Dysuria [ ]  Nocturia[ ]  Urgency [ ]   Hesitancy [ ]  Discharge [ ]  Neuro: Headaches[ ]  Vertigo[ ]  Paresthesias[ ]  Spasm [ ]  Speech changes [ ]  Incoordination [ ]   Ortho: Arthritis [ ]  Joint pain [ ]  Muscle pain [ ]  Joint swelling [ ]  Back Pain [ ]  Skin:  Rash [ ]   Pruritis [ ]  Change in skin lesion [ ]   Psych: Depression[ ]  Anxiety[ ]  Confusion [ ]  Memory loss [ ]   Heme/Lypmh: Bleeding [ ]  Bruising [ ]  Enlarged lymph nodes [ ]   Endocrine: Visual blurring [ ]  Paresthesia [ ]  Polyuria [ ]  Polydypsea [ ]    Heat/cold intolerance [ ]  Hypoglycemia [ ]   Family history- Review and unchanged Social history- Review and unchanged Physical Exam: BP 122/70  Pulse 60  Temp(Src) 97.7 F (36.5 C)  Resp 16  Ht 6' 2.25" (1.886 m)  Wt 269 lb (122.018 kg)  BMI 34.30 kg/m2 Wt Readings from Last 3 Encounters:  09/14/14 269 lb (122.018 kg)  08/14/14 274 lb 9.6 oz (124.558 kg)  08/09/14 272 lb 11.2 oz (123.696 kg)   General Appearance: Well nourished, in no apparent distress. Eyes: PERRLA, EOMs, conjunctiva no swelling or erythema Sinuses: No Frontal/maxillary tenderness ENT/Mouth: Ext aud canals clear, TMs without erythema, bulging. No erythema, swelling, or exudate on post pharynx.  Tonsils not swollen or erythematous. Hearing normal.  Neck: Supple, thyroid normal.  Respiratory: Respiratory effort normal, BS equal bilaterally without rales, rhonchi, wheezing or stridor.  Cardio: RRR with no MRGs. Brisk peripheral pulses  without edema.  Abdomen: Soft, + BS.  Non tender, no guarding, rebound, hernias, masses. Lymphatics: Non tender without lymphadenopathy.  Musculoskeletal: Full ROM, 5/5 strength, normal gait.  Skin: Warm, dry without rashes, lesions, ecchymosis.  Neuro: Cranial nerves intact. No cerebellar symptoms. Sensation intact.  Psych: Awake and oriented X 3, normal  affect, Insight and Judgment appropriate.    Vicie Mutters 4:54 PM

## 2014-09-15 ENCOUNTER — Ambulatory Visit: Payer: Self-pay | Admitting: Physician Assistant

## 2014-09-15 LAB — HEPATIC FUNCTION PANEL
ALT: 18 U/L (ref 0–53)
AST: 20 U/L (ref 0–37)
Albumin: 4.3 g/dL (ref 3.5–5.2)
Alkaline Phosphatase: 95 U/L (ref 39–117)
Bilirubin, Direct: 0.2 mg/dL (ref 0.0–0.3)
Indirect Bilirubin: 0.7 mg/dL (ref 0.2–1.2)
Total Bilirubin: 0.9 mg/dL (ref 0.2–1.2)
Total Protein: 6.6 g/dL (ref 6.0–8.3)

## 2014-09-15 LAB — LIPID PANEL
Cholesterol: 103 mg/dL (ref 0–200)
HDL: 42 mg/dL (ref >39)
LDL Cholesterol: 43 mg/dL (ref 0–99)
Total CHOL/HDL Ratio: 2.5 Ratio
Triglycerides: 89 mg/dL (ref <150)
VLDL: 18 mg/dL (ref 0–40)

## 2014-09-15 LAB — CBC WITH DIFFERENTIAL/PLATELET
Basophils Absolute: 0.1 10*3/uL (ref 0.0–0.1)
Basophils Relative: 1 % (ref 0–1)
Eosinophils Absolute: 0.2 10*3/uL (ref 0.0–0.7)
Eosinophils Relative: 3 % (ref 0–5)
HCT: 42.3 % (ref 39.0–52.0)
Hemoglobin: 14.8 g/dL (ref 13.0–17.0)
Lymphocytes Relative: 24 % (ref 12–46)
Lymphs Abs: 1.5 10*3/uL (ref 0.7–4.0)
MCH: 29.6 pg (ref 26.0–34.0)
MCHC: 35 g/dL (ref 30.0–36.0)
MCV: 84.6 fL (ref 78.0–100.0)
Monocytes Absolute: 0.5 10*3/uL (ref 0.1–1.0)
Monocytes Relative: 8 % (ref 3–12)
Neutro Abs: 3.9 10*3/uL (ref 1.7–7.7)
Neutrophils Relative %: 64 % (ref 43–77)
Platelets: 258 10*3/uL (ref 150–400)
RBC: 5 MIL/uL (ref 4.22–5.81)
RDW: 14.7 % (ref 11.5–15.5)
WBC: 6.1 10*3/uL (ref 4.0–10.5)

## 2014-09-15 LAB — BASIC METABOLIC PANEL WITH GFR
BUN: 8 mg/dL (ref 6–23)
CO2: 27 mEq/L (ref 19–32)
Calcium: 9.3 mg/dL (ref 8.4–10.5)
Chloride: 99 mEq/L (ref 96–112)
Creat: 1.04 mg/dL (ref 0.50–1.35)
GFR, Est African American: >89 mL/min
GFR, Est Non African American: 78 mL/min
Glucose, Bld: 95 mg/dL (ref 70–99)
Potassium: 5.1 mEq/L (ref 3.5–5.3)
Sodium: 136 mEq/L (ref 135–145)

## 2014-09-15 LAB — MAGNESIUM: Magnesium: 2.1 mg/dL (ref 1.5–2.5)

## 2014-09-15 LAB — TSH: TSH: 1.952 u[IU]/mL (ref 0.350–4.500)

## 2014-09-29 ENCOUNTER — Other Ambulatory Visit: Payer: Self-pay | Admitting: Emergency Medicine

## 2014-12-14 ENCOUNTER — Other Ambulatory Visit: Payer: Self-pay | Admitting: Internal Medicine

## 2014-12-21 ENCOUNTER — Encounter: Payer: Self-pay | Admitting: Internal Medicine

## 2014-12-21 ENCOUNTER — Ambulatory Visit (INDEPENDENT_AMBULATORY_CARE_PROVIDER_SITE_OTHER): Payer: No Typology Code available for payment source | Admitting: Physician Assistant

## 2014-12-21 VITALS — BP 124/70 | HR 56 | Temp 97.7°F | Resp 16 | Ht 74.25 in | Wt 271.4 lb

## 2014-12-21 DIAGNOSIS — E785 Hyperlipidemia, unspecified: Secondary | ICD-10-CM

## 2014-12-21 DIAGNOSIS — I10 Essential (primary) hypertension: Secondary | ICD-10-CM

## 2014-12-21 DIAGNOSIS — R7303 Prediabetes: Secondary | ICD-10-CM

## 2014-12-21 DIAGNOSIS — E559 Vitamin D deficiency, unspecified: Secondary | ICD-10-CM

## 2014-12-21 DIAGNOSIS — R7309 Other abnormal glucose: Secondary | ICD-10-CM

## 2014-12-21 DIAGNOSIS — Z79899 Other long term (current) drug therapy: Secondary | ICD-10-CM

## 2014-12-21 LAB — CBC WITH DIFFERENTIAL/PLATELET
Basophils Absolute: 0.1 10*3/uL (ref 0.0–0.1)
Basophils Relative: 1 % (ref 0–1)
Eosinophils Absolute: 0.2 10*3/uL (ref 0.0–0.7)
Eosinophils Relative: 3 % (ref 0–5)
HCT: 42.9 % (ref 39.0–52.0)
Hemoglobin: 15 g/dL (ref 13.0–17.0)
Lymphocytes Relative: 22 % (ref 12–46)
Lymphs Abs: 1.1 10*3/uL (ref 0.7–4.0)
MCH: 29.1 pg (ref 26.0–34.0)
MCHC: 35 g/dL (ref 30.0–36.0)
MCV: 83.1 fL (ref 78.0–100.0)
MPV: 11.4 fL (ref 8.6–12.4)
Monocytes Absolute: 0.5 10*3/uL (ref 0.1–1.0)
Monocytes Relative: 9 % (ref 3–12)
Neutro Abs: 3.3 10*3/uL (ref 1.7–7.7)
Neutrophils Relative %: 65 % (ref 43–77)
Platelets: 192 10*3/uL (ref 150–400)
RBC: 5.16 MIL/uL (ref 4.22–5.81)
RDW: 14.4 % (ref 11.5–15.5)
WBC: 5 10*3/uL (ref 4.0–10.5)

## 2014-12-21 MED ORDER — LISINOPRIL 10 MG PO TABS: 10.0000 mg | ORAL_TABLET | Freq: Every day | ORAL | Status: DC

## 2014-12-21 NOTE — Progress Notes (Signed)
Assessment and Plan:  Hypertension: Continue medication, monitor blood pressure at home. Continue DASH diet.  Reminder to go to the ER if any CP, SOB, nausea, dizziness, severe HA, changes vision/speech, left arm numbness and tingling, and jaw pain. Cholesterol: Continue diet and exercise. Check cholesterol.  Diabetes with CKD/CAD-Continue diet and exercise. Check A1C Vitamin D Def- check level and continue medications.  Smoking cessation-  instruction/counseling given, counseled patient on the dangers of tobacco use, advised patient to stop smoking, and reviewed strategies to maximize success, patient not ready to quit at this time, states "will smoke until I die" CT chest- will schedule, declines switching ACE to ARB to see if helps cough Obesity with co morbidities- long discussion about weight loss, diet, and exercise   Continue diet and meds as discussed. Further disposition pending results of labs.  HPI 64 y.o. male  presents for 3 month follow up with hypertension, hyperlipidemia, prediabetes and vitamin D. His blood pressure has been controlled at home, today their BP is BP: 124/70 mmHg He does not workout. He denies chest pain, shortness of breath, dizziness. He has history of CAD, s/p CABG in 2013.  He is on cholesterol medication, zetia and denies myalgias. His cholesterol is at goal less than 70. The cholesterol last visit was:   Lab Results  Component Value Date   CHOL 103 09/14/2014   HDL 42 09/14/2014   LDLCALC 43 09/14/2014   TRIG 89 09/14/2014   CHOLHDL 2.5 09/14/2014   He has been working on diet and exercise for Diabetes with diabetic chronic kidney disease, CAD s/p CABG, he is on bASA, he is on ACE/ARB, and denies paresthesia of the feet, polydipsia, polyuria and visual disturbances. Last A1C was:  Lab Results  Component Value Date   HGBA1C 6.7* 08/08/2014   Patient is on Vitamin D supplement.   Lab Results  Component Value Date   VD25OH 72 06/15/2014     He is  chronic smoker and had an abnormal CT in Sept. He is was suppose to have a follow up CT that he has not had.  BMI is Body mass index is 34.61 kg/(m^2)., he is working on diet and exercise. Wt Readings from Last 3 Encounters:  12/21/14 271 lb 6.4 oz (123.106 kg)  09/14/14 269 lb (122.018 kg)  08/14/14 274 lb 9.6 oz (124.558 kg)     Current Medications:  Current Outpatient Prescriptions on File Prior to Visit  Medication Sig Dispense Refill  . atenolol (TENORMIN) 50 MG tablet TAKE 1 TABLET BY MOUTH DAILY 30 tablet 3  . Cyanocobalamin (VITAMIN B-12 PO) Take 1,000 mcg by mouth daily.     . Magnesium 400 MG CAPS Take 400 mg by mouth. Takes 4 per day    . metFORMIN (GLUCOPHAGE-XR) 500 MG 24 hr tablet Take 500-1,000 mg by mouth 3 (three) times daily. Take 2 tablet (1000mg ) with breakfast  and 2 tablets (1000mg ) at supper    . Omega-3 Fatty Acids (FISH OIL PO) Take 2,000 mg by mouth daily.    Marland Kitchen omeprazole (PRILOSEC) 40 MG capsule Take 40 mg by mouth 2 (two) times daily.    . Vitamin D, Ergocalciferol, (DRISDOL) 50000 UNITS CAPS capsule Take 50,000 Units by mouth 3 (three) times a week.    Marland Kitchen ZETIA 10 MG tablet TAKE 1 TABLET BY MOUTH EVERY DAY FOR CHOLESTEROL 30 tablet 10   No current facility-administered medications on file prior to visit.   Medical History:  Past Medical History  Diagnosis  Date  . Hypertension   . Hyperlipidemia   . Bradycardia   . Myocardial infarction     2004  . Tobacco abuse 03/28/2012  . 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 CAD (coronary artery disease)     remote MI in 2004 with PCI; s/p CABG x 3 03/2012  . Adenomatous colon polyp 02/15/2009  . Family history of malignant neoplasm of gastrointestinal tract   . Prediabetes   . GERD (gastroesophageal reflux disease)   . Vitamin D deficiency   . Obesity    Allergies: No Known Allergies   Review of Systems:  Review of Systems  Constitutional: Negative.    HENT: Negative.   Eyes: Negative.   Respiratory: Positive for cough. Negative for hemoptysis, sputum production, shortness of breath and wheezing.   Cardiovascular: Negative.   Gastrointestinal: Negative.   Genitourinary: Negative.   Musculoskeletal: Negative.   Skin: Negative.   Neurological: Negative.   Psychiatric/Behavioral: Negative.      Family history- Review and unchanged Social history- Review and unchanged Physical Exam: BP 124/70 mmHg  Pulse 56  Temp(Src) 97.7 F (36.5 C)  Resp 16  Ht 6' 2.25" (1.886 m)  Wt 271 lb 6.4 oz (123.106 kg)  BMI 34.61 kg/m2 Wt Readings from Last 3 Encounters:  12/21/14 271 lb 6.4 oz (123.106 kg)  09/14/14 269 lb (122.018 kg)  08/14/14 274 lb 9.6 oz (124.558 kg)   General Appearance: Well nourished, in no apparent distress. Eyes: PERRLA, EOMs, conjunctiva no swelling or erythema Sinuses: No Frontal/maxillary tenderness ENT/Mouth: Ext aud canals clear, TMs without erythema, bulging. No erythema, swelling, or exudate on post pharynx.  Tonsils not swollen or erythematous. Hearing normal.  Neck: Supple, thyroid normal.  Respiratory: Respiratory effort normal, BS equal bilaterally without rales, rhonchi, wheezing or stridor.  Cardio: RRR with no MRGs. Brisk peripheral pulses without edema.  Abdomen: Soft, + BS.  Non tender, no guarding, rebound, hernias, masses. Lymphatics: Non tender without lymphadenopathy.  Musculoskeletal: Full ROM, 5/5 strength, normal gait.  Skin: Warm, dry without rashes, lesions, ecchymosis.  Neuro: Cranial nerves intact. Normal muscle tone, no cerebellar symptoms. Sensation intact.  Psych: Awake and oriented X 3, normal affect, Insight and Judgment appropriate.    Vicie Mutters, PA-C 5:12 PM [facility]

## 2014-12-21 NOTE — Patient Instructions (Addendum)
Call your insurance to check on the shingles vaccine.  Can take vitamin C with the iron to increase absorption.  Will get the CT scan of your lungs.  Try the zetia every other day to decrease cost.   Your LDL is not in range. Your LDL is the bad cholesterol that can lead to heart attack and stroke. To lower your number you can decrease your fatty foods, red meat, cheese, milk and increase fiber like whole grains and veggies. You can also add a fiber supplement like Metamucil or Benefiber.    Recommend the book "The END of DIETING" by Dr Baker Janus   and the book "The END of DIABETES " by Dr Excell Seltzer  At [email or web address] - get book & Audio CD's      Being diabetic has a  300% increased risk for heart attack, stroke, cancer, and alzheimer- type vascular dementia. It is very important that you work harder with diet by avoiding all foods that are white except chicken & fish. Avoid white rice (brown & wild rice is OK), white potatoes (sweetpotatoes in moderation is OK), White bread or wheat bread or anything made out of white flour like bagels, donuts, rolls, buns, biscuits, cakes, pastries, cookies, pizza crust, and pasta (made from white flour & egg whites) - vegetarian pasta or spinach or wheat pasta is OK. Multigrain breads like Arnold's or Pepperidge [employer], or multigrain sandwich thins or flatbreads.  Diet, exercise and weight loss can reverse and cure diabetes in the early stages.  Diet, exercise and weight loss is very important in the control and prevention of complications of diabetes which affects every system in your body, ie. Brain - dementia/stroke, eyes - glaucoma/blindness, heart - heart attack/heart failure, kidneys - dialysis, stomach - gastric paralysis, intestines - malabsorption, nerves - severe painful neuritis, circulation - gangrene & loss of a leg(s), and finally cancer and Alzheimers.    I recommend avoid fried & greasy foods,  sweets/candy, white rice (brown or wild rice or Quinoa  is OK), white potatoes (sweet potatoes are OK) - anything made from white flour - bagels, doughnuts, rolls, buns, biscuits,white and wheat breads, pizza crust and traditional pasta made of white flour & egg white(vegetarian pasta or spinach or wheat pasta is OK).  Multi-grain bread is OK - like multi-grain flat bread or sandwich thins. Avoid alcohol in excess. Exercise is also important.    Eat all the vegetables you want - avoid meat, especially red meat and dairy - especially cheese.  Cheese is the most concentrated form of trans-fats which is the worst thing to clog up our arteries. Veggie cheese is OK which can be found in the fresh produce section at [facility] or [employer] or [employer]

## 2014-12-22 LAB — LIPID PANEL
Cholesterol: 123 mg/dL (ref 0–200)
HDL: 50 mg/dL (ref >39)
LDL Cholesterol: 53 mg/dL (ref 0–99)
Total CHOL/HDL Ratio: 2.5 Ratio
Triglycerides: 99 mg/dL (ref <150)
VLDL: 20 mg/dL (ref 0–40)

## 2014-12-22 LAB — BASIC METABOLIC PANEL WITH GFR
BUN: 10 mg/dL (ref 6–23)
CO2: 27 mEq/L (ref 19–32)
Calcium: 9.5 mg/dL (ref 8.4–10.5)
Chloride: 95 mEq/L — ABNORMAL LOW (ref 96–112)
Creat: 1.06 mg/dL (ref 0.50–1.35)
GFR, Est African American: 87 mL/min
GFR, Est Non African American: 75 mL/min
Glucose, Bld: 108 mg/dL — ABNORMAL HIGH (ref 70–99)
Potassium: 4.8 mEq/L (ref 3.5–5.3)
Sodium: 131 mEq/L — ABNORMAL LOW (ref 135–145)

## 2014-12-22 LAB — HEMOGLOBIN A1C
Hgb A1c MFr Bld: 6.5 % — ABNORMAL HIGH (ref <5.7)
Mean Plasma Glucose: 140 mg/dL — ABNORMAL HIGH (ref <117)

## 2014-12-22 LAB — HEPATIC FUNCTION PANEL
ALT: 17 U/L (ref 0–53)
AST: 16 U/L (ref 0–37)
Albumin: 4.1 g/dL (ref 3.5–5.2)
Alkaline Phosphatase: 106 U/L (ref 39–117)
Bilirubin, Direct: 0.2 mg/dL (ref 0.0–0.3)
Indirect Bilirubin: 0.7 mg/dL (ref 0.2–1.2)
Total Bilirubin: 0.9 mg/dL (ref 0.2–1.2)
Total Protein: 6.4 g/dL (ref 6.0–8.3)

## 2014-12-22 LAB — TSH: TSH: 2.554 u[IU]/mL (ref 0.350–4.500)

## 2014-12-22 LAB — VITAMIN D 25 HYDROXY (VIT D DEFICIENCY, FRACTURES): Vit D, 25-Hydroxy: 92 ng/mL (ref 30–100)

## 2014-12-22 LAB — MAGNESIUM: Magnesium: 1.7 mg/dL (ref 1.5–2.5)

## 2015-01-29 ENCOUNTER — Other Ambulatory Visit: Payer: Self-pay | Admitting: Internal Medicine

## 2015-03-22 ENCOUNTER — Encounter: Payer: Self-pay | Admitting: Physician Assistant

## 2015-03-22 ENCOUNTER — Ambulatory Visit (INDEPENDENT_AMBULATORY_CARE_PROVIDER_SITE_OTHER): Payer: No Typology Code available for payment source | Admitting: Physician Assistant

## 2015-03-22 VITALS — BP 124/62 | HR 62 | Temp 97.8°F | Resp 14 | Wt 271.4 lb

## 2015-03-22 DIAGNOSIS — F17219 Nicotine dependence, cigarettes, with unspecified nicotine-induced disorders: Secondary | ICD-10-CM

## 2015-03-22 DIAGNOSIS — R7309 Other abnormal glucose: Secondary | ICD-10-CM

## 2015-03-22 DIAGNOSIS — E559 Vitamin D deficiency, unspecified: Secondary | ICD-10-CM

## 2015-03-22 DIAGNOSIS — I10 Essential (primary) hypertension: Secondary | ICD-10-CM

## 2015-03-22 DIAGNOSIS — R7303 Prediabetes: Secondary | ICD-10-CM

## 2015-03-22 DIAGNOSIS — Z79899 Other long term (current) drug therapy: Secondary | ICD-10-CM

## 2015-03-22 DIAGNOSIS — E785 Hyperlipidemia, unspecified: Secondary | ICD-10-CM

## 2015-03-22 DIAGNOSIS — E871 Hypo-osmolality and hyponatremia: Secondary | ICD-10-CM

## 2015-03-22 LAB — CBC WITH DIFFERENTIAL/PLATELET
Basophils Absolute: 0.1 10*3/uL (ref 0.0–0.1)
Basophils Relative: 1 % (ref 0–1)
Eosinophils Absolute: 0.2 10*3/uL (ref 0.0–0.7)
Eosinophils Relative: 3 % (ref 0–5)
HCT: 42.3 % (ref 39.0–52.0)
Hemoglobin: 14.5 g/dL (ref 13.0–17.0)
Lymphocytes Relative: 22 % (ref 12–46)
Lymphs Abs: 1.2 10*3/uL (ref 0.7–4.0)
MCH: 29.3 pg (ref 26.0–34.0)
MCHC: 34.3 g/dL (ref 30.0–36.0)
MCV: 85.5 fL (ref 78.0–100.0)
MPV: 11 fL (ref 8.6–12.4)
Monocytes Absolute: 0.4 10*3/uL (ref 0.1–1.0)
Monocytes Relative: 8 % (ref 3–12)
Neutro Abs: 3.7 10*3/uL (ref 1.7–7.7)
Neutrophils Relative %: 66 % (ref 43–77)
Platelets: 202 10*3/uL (ref 150–400)
RBC: 4.95 MIL/uL (ref 4.22–5.81)
RDW: 14.6 % (ref 11.5–15.5)
WBC: 5.6 10*3/uL (ref 4.0–10.5)

## 2015-03-22 LAB — HEMOGLOBIN A1C
Hgb A1c MFr Bld: 6.6 % — ABNORMAL HIGH (ref <5.7)
Mean Plasma Glucose: 143 mg/dL — ABNORMAL HIGH (ref <117)

## 2015-03-22 MED ORDER — VITAMIN D (ERGOCALCIFEROL) 1.25 MG (50000 UNIT) PO CAPS: 50000.0000 [IU] | ORAL_CAPSULE | ORAL | Status: DC

## 2015-03-22 NOTE — Patient Instructions (Signed)
The lisinopril can cause a nonproductive cough, so stop this for now and let us know if your cough gets better.  Stop smoking and your cough may get better.  If you start having wheezing, shortness of breath, chest pain come into the office or go to the ER.   Diabetes is a very complicated disease...lets simplify it.  An easy way to look at it to understand the complications is if you think of the extra sugar floating in your blood stream as glass shards floating through your blood stream.    Diabetes affects your small vessels first: 1) The glass shards (sugar) scraps down the tiny blood vessels in your eyes and lead to diabetic retinopathy, the leading cause of blindness in the [place]. Diabetes is the leading cause of newly diagnosed adult (81 to 62 years of age) blindness in the [place].  2) The glass shards scratches down the tiny vessels of your legs leading to nerve damage called neuropathy and can lead to amputations of your feet. More than 60% of all non-traumatic amputations of lower limbs occur in people with diabetes.  3) Over time the small vessels in your brain are shredded and closed off, individually this does not cause any problems but over a long period of time many of the small vessels being blocked can lead to Vascular Dementia.   4) Your kidney's are a filter system and have a "net" that keeps certain things in the body and lets bad things out. Sugar shreds this net and leads to kidney damage and eventually failure. Decreasing the sugar that is destroying the net and certain blood pressure medications can help stop or decrease progression of kidney disease. Diabetes was the primary cause of kidney failure in 44 percent of all new cases in 2011.  5) Diabetes also destroys the small vessels in your penis that lead to erectile dysfunction. Eventually the vessels are so damaged that you may not be responsive to cialis or viagra.   Diabetes and your large vessels: Your larger  vessels consist of your coronary arteries in your heart and the carotid vessels to your brain. Diabetes or even increased sugars put you at 300% increased risk of heart attack and stroke and this is why.. The sugar scrapes down your large blood vessels and your body sees this as an internal injury and tries to repair itself. Just like you get a scab on your skin, your platelets will stick to the blood vessel wall trying to heal it. This is why we have diabetics on low dose aspirin daily, this prevents the platelets from sticking and can prevent plaque formation. In addition, your body takes cholesterol and tries to shove it into the open wound. This is why we want your LDL, or bad cholesterol, below 70.   The combination of platelets and cholesterol over 5-10 years forms plaque that can break off and cause a heart attack or stroke.   PLEASE REMEMBER:  Diabetes is preventable! Up to 8 percent of complications and morbidities among individuals with type 2 diabetes can be prevented, delayed, or effectively treated and minimized with regular visits to a health professional, appropriate monitoring and medication, and a healthy diet and lifestyle.  We want weight loss that will last so you should lose 1-2 pounds a week.  THAT IS IT! Please pick THREE things a month to change. Once it is a habit check off the item. Then pick another three items off the list to become habits.  If you are already doing a habit on the list GREAT!  Cross that item off! o Don't drink your calories. Ie, alcohol, soda, fruit juice, and sweet tea.  o Drink more water. Drink a glass when you feel hungry or before each meal.  o Eat breakfast - Complex carb and protein (likeDannon light and fit yogurt, oatmeal, fruit, eggs, [place] bacon). o Measure your cereal.  Eat no more than one cup a day. (ie [place]) o Eat an apple a day. o Add a vegetable a day. o Try a new vegetable a month. o Use Pam! Stop using oil or butter to cook. o Don't  finish your plate or use smaller plates. o Share your dessert. o Eat sugar free Jello for dessert or frozen grapes. o Don't eat 2-3 hours before bed. o Switch to whole wheat bread, pasta, and brown rice. o Make healthier choices when you eat out. No fries! o Pick baked chicken, NOT fried. o Don't forget to SLOW DOWN when you eat. It is not going anywhere.  o Take the stairs. o Park far away in the parking lot o [employer] (or weights) for 10 minutes while watching TV. o Walk at work for 10 minutes during break. o Walk outside 1 time a week with your friend, kids, dog, or significant other. o Start a walking group at [place] the mall as much as you can tolerate.  o Keep a food diary. o Weigh yourself daily. o Walk for 15 minutes 3 days per week. o Cook at home more often and eat out less.  If life happens and you go back to old habits, it is okay.  Just start over. You can do it!   If you experience chest pain, get short of breath, or tired during the exercise, please stop immediately and inform your doctor.   Smoking Cessation Quitting smoking is important to your health and has many advantages. However, it is not always easy to quit since nicotine is a very addictive drug. Oftentimes, people try 3 times or more before being able to quit. This document explains the best ways for you to prepare to quit smoking. Quitting takes hard work and a lot of effort, but you can do it. ADVANTAGES OF QUITTING SMOKING  You will live longer, feel better, and live better.  Your body will feel the impact of quitting smoking almost immediately.  Within 20 minutes, blood pressure decreases. Your pulse returns to its normal level.  After 8 hours, carbon monoxide levels in the blood return to normal. Your oxygen level increases.  After 24 hours, the chance of having a heart attack starts to decrease. Your breath, hair, and body stop smelling like smoke.  After 48 hours, damaged nerve  endings begin to recover. Your sense of taste and smell improve.  After 72 hours, the body is virtually free of nicotine. Your bronchial tubes relax and breathing becomes easier.  After 2 to 12 weeks, lungs can hold more air. Exercise becomes easier and circulation improves.  The risk of having a heart attack, stroke, cancer, or lung disease is greatly reduced.  After 1 year, the risk of coronary heart disease is cut in half.  After 5 years, the risk of stroke falls to the same as a nonsmoker.  After 10 years, the risk of lung cancer is cut in half and the risk of other cancers decreases significantly.  After 15 years, the risk of coronary heart disease drops, usually  to the level of a nonsmoker.  If you are pregnant, quitting smoking will improve your chances of having a healthy baby.  The people you live with, especially any children, will be healthier.  You will have extra money to spend on things other than cigarettes. QUESTIONS TO THINK ABOUT BEFORE ATTEMPTING TO QUIT You may want to talk about your answers with your health care provider.  Why do you want to quit?  If you tried to quit in the past, what helped and what did not?  What will be the most difficult situations for you after you quit? How will you plan to handle them?  Who can help you through the tough times? Your family? Friends? A health care provider?  What pleasures do you get from smoking? What ways can you still get pleasure if you quit? Here are some questions to ask your health care provider:  How can you help me to be successful at quitting?  What medicine do you think would be best for me and how should I take it?  What should I do if I need more help?  What is smoking withdrawal like? How can I get information on withdrawal? GET READY  Set a quit date.  Change your environment by getting rid of all cigarettes, ashtrays, matches, and lighters in your home, car, or work. Do not let people smoke  in your home.  Review your past attempts to quit. Think about what worked and what did not. GET SUPPORT AND ENCOURAGEMENT You have a better chance of being successful if you have help. You can get support in many ways.  Tell your family, friends, and coworkers that you are going to quit and need their support. Ask them not to smoke around you.  Get individual, group, or telephone counseling and support. Programs are available at [employer] and health centers. Call your local health department for information about programs in your area.  Spiritual beliefs and practices may help some smokers quit.  Download a "quit meter" on your computer to keep track of quit statistics, such as how long you have gone without smoking, cigarettes not smoked, and money saved.  Get a self-help book about quitting smoking and staying off tobacco. [place] yourself from urges to smoke. Talk to someone, go for a walk, or occupy your time with a task.  Change your normal routine. Take a different route to work. Drink tea instead of coffee. Eat breakfast in a different place.  Reduce your stress. Take a hot bath, exercise, or read a book.  Plan something enjoyable to do every day. Reward yourself for not smoking.  Explore interactive web-based programs that specialize in helping you quit. GET MEDICINE AND USE IT CORRECTLY Medicines can help you stop smoking and decrease the urge to smoke. Combining medicine with the above behavioral methods and support can greatly increase your chances of successfully quitting smoking.  Nicotine replacement therapy helps deliver nicotine to your body without the negative effects and risks of smoking. Nicotine replacement therapy includes nicotine gum, lozenges, inhalers, nasal sprays, and skin patches. Some may be available over-the-counter and others require a prescription.  Antidepressant medicine helps people abstain from smoking, but  how this works is unknown. This medicine is available by prescription.  Nicotinic receptor partial agonist medicine simulates the effect of nicotine in your brain. This medicine is available by prescription. Ask your health care provider for advice about which medicines to use and  how to use them based on your health history. Your health care provider will tell you what side effects to look out for if you choose to be on a medicine or therapy. Carefully read the information on the package. Do not use any other product containing nicotine while using a nicotine replacement product.  RELAPSE OR DIFFICULT SITUATIONS Most relapses occur within the first 3 months after quitting. Do not be discouraged if you start smoking again. Remember, most people try several times before finally quitting. You may have symptoms of withdrawal because your body is used to nicotine. You may crave cigarettes, be irritable, feel very hungry, cough often, get headaches, or have difficulty concentrating. The withdrawal symptoms are only temporary. They are strongest when you first quit, but they will go away within 10-14 days. To reduce the chances of relapse, try to:  Avoid drinking alcohol. Drinking lowers your chances of successfully quitting.  Reduce the amount of caffeine you consume. Once you quit smoking, the amount of caffeine in your body increases and can give you symptoms, such as a rapid heartbeat, sweating, and anxiety.  Avoid smokers because they can make you want to smoke.  Do not let weight gain distract you. Many smokers will gain weight when they quit, usually less than 10 pounds. Eat a healthy diet and stay active. You can always lose the weight gained after you quit.  Find ways to improve your mood other than smoking. FOR MORE INFORMATION  www.smokefree.gov  Document Released: 11/28/2001 Document Revised: 04/20/2014 Document Reviewed: 03/14/2012 [facility] Patient Information 2015 [place], [place]. This  information is not intended to replace advice given to you by your health care provider. Make sure you discuss any questions you have with your health care provider.

## 2015-03-22 NOTE — Progress Notes (Signed)
Assessment and Plan:  1. Hypertension -Continue medication, monitor blood pressure at home. Continue DASH diet.  Reminder to go to the ER if any CP, SOB, nausea, dizziness, severe HA, changes vision/speech, left arm numbness and tingling and jaw pain.  2. Cholesterol -Continue diet and exercise. Check cholesterol.   3. Diabetes with diabetic chronic kidney disease -Continue diet and exercise. Check A1C  4. Vitamin D Def - check level and continue medications.   5. CAD s/p CABG Control blood pressure, cholesterol, glucose, increase exercise.   6. Smoking cessation -  instruction/counseling given, counseled patient on the dangers of tobacco use, advised patient to stop smoking, and reviewed strategies to maximize success, patient not ready to quit at this time.   7. Cough Lungs CTAB, stop ACE for now, may switch to diovan, get allergy pill, call if worse, stop smoking.   Continue diet and meds as discussed. Further disposition pending results of labs. Discussed med's effects and SE's.   Over 30 minutes of exam, counseling, chart review, and critical decision making was performed   HPI 64 y.o. male  presents for 3 month follow up on hypertension, cholesterol, diabetes and vitamin D deficiency.   His blood pressure has been controlled at home, today his BP is BP: 124/62 mmHg.  He does not workout. He denies chest pain, shortness of breath, dizziness. He has a history of CAD s/p CABG in 2013.  He is a smoker and continues to smoke.   He is on cholesterol medication, lipitor 80mg  and zetia 10mg  and denies myalgias. His cholesterol is at goal. The cholesterol was:   Lab Results  Component Value Date   CHOL 123 12/21/2014   HDL 50 12/21/2014   LDLCALC 53 12/21/2014   TRIG 99 12/21/2014   CHOLHDL 2.5 12/21/2014    He has been working on diet and exercise for diabetes with diabetic chronic kidney disease, he is on bASA, he is on ACE/ARB, MF 500, 2000 mg a day, and denies  paresthesia  of the feet, polydipsia, polyuria and visual disturbances124/6. Last A1C was:  Lab Results  Component Value Date   HGBA1C 6.5* 12/21/2014  Patient is on Vitamin D supplement. Lab Results  Component Value Date   VD25OH 92 12/21/2014  He stepped on glass with his right foot in Oct/Nov, he got out the glass and it was hurting, got better but now it is starting to hurt again with standing on barefoot for a long time.  Had nonproductive cough x 1 month, worse with lying down, has tickle in his throat.  BMI is Body mass index is 34.61 kg/(m^2)., he is working on diet and exercise. Wt Readings from Last 3 Encounters:  03/22/15 271 lb 6.4 oz (123.106 kg)  12/21/14 271 lb 6.4 oz (123.106 kg)  09/14/14 269 lb (122.018 kg)    Current Medications:  Current Outpatient Prescriptions on File Prior to Visit  Medication Sig Dispense Refill  . atenolol (TENORMIN) 50 MG tablet TAKE 1 TABLET BY MOUTH DAILY 30 tablet 3  . atorvastatin (LIPITOR) 80 MG tablet TAKE 1 TABLET (80 MG TOTAL) BY MOUTH DAILY. FOR CHOLESTEROL 30 tablet 5  . Cyanocobalamin (VITAMIN B-12 PO) Take 1,000 mcg by mouth daily.     Marland Kitchen lisinopril (PRINIVIL,ZESTRIL) 10 MG tablet Take 1 tablet (10 mg total) by mouth daily. 30 tablet 11  . Magnesium 400 MG CAPS Take 400 mg by mouth. Takes 4 per day    . metFORMIN (GLUCOPHAGE-XR) 500 MG 24 hr tablet  Take 1,000 mg by mouth 3 (three) times daily. Take 2 tablet (1000mg ) with breakfast  and 2 tablets (1000mg ) at supper    . Omega-3 Fatty Acids (FISH OIL PO) Take 2,000 mg by mouth daily.    Marland Kitchen omeprazole (PRILOSEC) 40 MG capsule Take 40 mg by mouth 2 (two) times daily.    . Vitamin D, Ergocalciferol, (DRISDOL) 50000 UNITS CAPS capsule Take 50,000 Units by mouth 3 (three) times a week.    Marland Kitchen ZETIA 10 MG tablet TAKE 1 TABLET BY MOUTH EVERY DAY FOR CHOLESTEROL 30 tablet 10   No current facility-administered medications on file prior to visit.   Medical History:  Past Medical History  Diagnosis Date  .  Hypertension   . Hyperlipidemia   . Bradycardia   . Myocardial infarction     2004  . Tobacco abuse 03/28/2012  . 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 CAD (coronary artery disease)     remote MI in 2004 with PCI; s/p CABG x 3 03/2012  . Adenomatous colon polyp 02/15/2009  . Family history of malignant neoplasm of gastrointestinal tract   . Prediabetes   . GERD (gastroesophageal reflux disease)   . Vitamin D deficiency   . Obesity    Allergies: No Known Allergies   Review of Systems:  Review of Systems  Constitutional: Negative.   HENT: Negative.   Eyes: Negative.   Respiratory: Positive for cough. Negative for hemoptysis, sputum production, shortness of breath and wheezing.   Cardiovascular: Negative.   Gastrointestinal: Negative.   Genitourinary: Negative.   Musculoskeletal: Negative.   Skin: Negative.   Neurological: Negative.   Endo/Heme/Allergies: Negative.   Psychiatric/Behavioral: Negative.     Family history- Review and unchanged Social history- Review and unchanged Physical Exam: BP 124/62 mmHg  Pulse 62  Temp(Src) 97.8 F (36.6 C) (Temporal)  Resp 14  Wt 271 lb 6.4 oz (123.106 kg) Wt Readings from Last 3 Encounters:  03/22/15 271 lb 6.4 oz (123.106 kg)  12/21/14 271 lb 6.4 oz (123.106 kg)  09/14/14 269 lb (122.018 kg)   General Appearance: Well nourished, in no apparent distress. Eyes: PERRLA, EOMs, conjunctiva no swelling or erythema Sinuses: No Frontal/maxillary tenderness ENT/Mouth: Ext aud canals clear, TMs without erythema, bulging. No erythema, swelling, or exudate on post pharynx.  Tonsils not swollen or erythematous. Hearing normal.  Neck: Supple, thyroid normal.  Respiratory: Respiratory effort normal, BS equal bilaterally without rales, rhonchi, wheezing or stridor.  Cardio: RRR with no MRGs. Brisk peripheral pulses without edema.  Abdomen: Soft, + BS.  Non tender, no guarding, rebound,  hernias, masses. Lymphatics: Non tender without lymphadenopathy.  Musculoskeletal: Full ROM, 5/5 strength, Normal gait Skin: Warm, dry without rashes, lesions, ecchymosis.  Neuro: Cranial nerves intact. No cerebellar symptoms.  Psych: Awake and oriented X 3, normal affect, Insight and Judgment appropriate.    Vicie Mutters, PA-C 1:58 PM [facility]

## 2015-03-23 LAB — HEPATIC FUNCTION PANEL
ALT: 18 U/L (ref 0–53)
AST: 17 U/L (ref 0–37)
Albumin: 4 g/dL (ref 3.5–5.2)
Alkaline Phosphatase: 95 U/L (ref 39–117)
Bilirubin, Direct: 0.2 mg/dL (ref 0.0–0.3)
Indirect Bilirubin: 0.5 mg/dL (ref 0.2–1.2)
Total Bilirubin: 0.7 mg/dL (ref 0.2–1.2)
Total Protein: 6.3 g/dL (ref 6.0–8.3)

## 2015-03-23 LAB — BASIC METABOLIC PANEL WITH GFR
BUN: 13 mg/dL (ref 6–23)
CO2: 27 mEq/L (ref 19–32)
Calcium: 9.1 mg/dL (ref 8.4–10.5)
Chloride: 100 mEq/L (ref 96–112)
Creat: 1.03 mg/dL (ref 0.50–1.35)
GFR, Est African American: >89 mL/min
GFR, Est Non African American: 78 mL/min
Glucose, Bld: 110 mg/dL — ABNORMAL HIGH (ref 70–99)
Potassium: 5.3 mEq/L (ref 3.5–5.3)
Sodium: 137 mEq/L (ref 135–145)

## 2015-03-23 LAB — LIPID PANEL
Cholesterol: 122 mg/dL (ref 0–200)
HDL: 56 mg/dL (ref >=40)
LDL Cholesterol: 52 mg/dL (ref 0–99)
Total CHOL/HDL Ratio: 2.2 Ratio
Triglycerides: 71 mg/dL (ref <150)
VLDL: 14 mg/dL (ref 0–40)

## 2015-03-23 LAB — MAGNESIUM: Magnesium: 2 mg/dL (ref 1.5–2.5)

## 2015-03-23 LAB — INSULIN, FASTING: Insulin fasting, serum: 6.5 u[IU]/mL (ref 2.0–19.6)

## 2015-03-23 LAB — VITAMIN D 25 HYDROXY (VIT D DEFICIENCY, FRACTURES): Vit D, 25-Hydroxy: 83 ng/mL (ref 30–100)

## 2015-03-23 LAB — TSH: TSH: 2.839 u[IU]/mL (ref 0.350–4.500)

## 2015-05-04 ENCOUNTER — Encounter: Payer: Self-pay | Admitting: Internal Medicine

## 2015-05-13 ENCOUNTER — Other Ambulatory Visit: Payer: Self-pay | Admitting: Internal Medicine

## 2015-06-17 ENCOUNTER — Ambulatory Visit (INDEPENDENT_AMBULATORY_CARE_PROVIDER_SITE_OTHER): Payer: No Typology Code available for payment source | Admitting: Internal Medicine

## 2015-06-17 ENCOUNTER — Encounter: Payer: Self-pay | Admitting: Internal Medicine

## 2015-06-17 VITALS — BP 138/88 | HR 60 | Temp 98.6°F | Resp 18 | Ht 74.25 in | Wt 275.2 lb

## 2015-06-17 DIAGNOSIS — E785 Hyperlipidemia, unspecified: Secondary | ICD-10-CM

## 2015-06-17 DIAGNOSIS — E1129 Type 2 diabetes mellitus with other diabetic kidney complication: Secondary | ICD-10-CM

## 2015-06-17 DIAGNOSIS — R5383 Other fatigue: Secondary | ICD-10-CM

## 2015-06-17 DIAGNOSIS — M25511 Pain in right shoulder: Secondary | ICD-10-CM

## 2015-06-17 DIAGNOSIS — Z1212 Encounter for screening for malignant neoplasm of rectum: Secondary | ICD-10-CM

## 2015-06-17 DIAGNOSIS — E669 Obesity, unspecified: Secondary | ICD-10-CM

## 2015-06-17 DIAGNOSIS — Z125 Encounter for screening for malignant neoplasm of prostate: Secondary | ICD-10-CM

## 2015-06-17 DIAGNOSIS — K219 Gastro-esophageal reflux disease without esophagitis: Secondary | ICD-10-CM

## 2015-06-17 DIAGNOSIS — R6889 Other general symptoms and signs: Secondary | ICD-10-CM

## 2015-06-17 DIAGNOSIS — I25708 Atherosclerosis of coronary artery bypass graft(s), unspecified, with other forms of angina pectoris: Secondary | ICD-10-CM

## 2015-06-17 DIAGNOSIS — E559 Vitamin D deficiency, unspecified: Secondary | ICD-10-CM

## 2015-06-17 DIAGNOSIS — I10 Essential (primary) hypertension: Secondary | ICD-10-CM

## 2015-06-17 DIAGNOSIS — Z6835 Body mass index (BMI) 35.0-35.9, adult: Secondary | ICD-10-CM

## 2015-06-17 DIAGNOSIS — Z0001 Encounter for general adult medical examination with abnormal findings: Secondary | ICD-10-CM

## 2015-06-17 DIAGNOSIS — Z79899 Other long term (current) drug therapy: Secondary | ICD-10-CM

## 2015-06-17 MED ORDER — PREDNISONE 20 MG PO TABS: ORAL_TABLET | ORAL | Status: DC

## 2015-06-17 MED ORDER — LOSARTAN POTASSIUM 100 MG PO TABS: ORAL_TABLET | ORAL | Status: DC

## 2015-06-17 NOTE — Progress Notes (Signed)
Patient ID: Curtis Perez, male   DOB: 01-May-1951, 64 y.o.   MRN: [id number]   Annual Comprehensive Examination  This very nice 64 y.o.male presents for complete physical.  Patient has been followed for HTN, T2_NIDDM, Hyperlipidemia, and Vitamin D Deficiency.   HTN predates since 2005 . Patient's BP has been controlled at home.Today's BP: 138/88 mmHg. Patient had his 1st MI in 2004 & in 2005 had a PTCA/Stent and in 2013 had a CABG. Patient denies any cardiac symptoms as chest pain, palpitations, shortness of breath, dizziness or ankle swelling. Patient does not exercise and continues to smoke against recommendations. He almost seems to have a "death wish" and very adamantly refuse to allow Code status.    Patient's hyperlipidemia is controlled with diet and medications. Patient denies myalgias or other medication SE's. 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 Last A1c was 6.6% on 03/22/2015.   Finally, patient has history of Vitamin D Deficiency of "6" in 2009 and last vitamin D was "43" on 03/22/2015.       Medication Sig  . atenolol  50 MG tablet TAKE 1 TABDAILY  . atorvastatin  80 MG tablet TAKE 1 TAB DAILY. FOR CHOLESTEROL  . VITAMIN B-12  Take 1,000 mcg  daily.   . Magnesium 400 MG CAPS Takes 4 per day  . metFORMIN -XR 500 MG  Take 1,000 mg  3 (three) times daily  . FISH OIL  Take 2,000 mg  daily.  Marland Kitchen omeprazole  40 MG capsule Take 40 mg  2 (two) times daily.  . Vitamin D 50,000 UNITS CAPS  Take 1 cap 3  times a week.  Marland Kitchen ZETIA 10 MG tablet TAKE 1 TAB EVERY DAY   . lisinopril  10 MG tablet Take 1 tab  daily.   Allergies - developed cough on lisinopril which resolved when stopped.   Past Medical History  Diagnosis Date  . Hypertension   . Hyperlipidemia   . Bradycardia   .  Myocardial infarction     2004  . Tobacco abuse 03/28/2012  . 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 CAD (coronary artery disease)     remote MI in 2004 with PCI; s/p CABG x 3 03/2012  . Adenomatous colon polyp 02/15/2009  . Family history of malignant neoplasm of gastrointestinal tract   . Prediabetes   . GERD (gastroesophageal reflux disease)   . Vitamin D deficiency   . Obesity    Health Maintenance  Topic Date Due  . FOOT EXAM  12/04/1963  . OPHTHALMOLOGY EXAM  12/04/1963  . ZOSTAVAX  12/03/2013  . URINE MICROALBUMIN  06/16/2015  . COLONOSCOPY  06/29/2015  . INFLUENZA VACCINE  07/19/2015  . HEMOGLOBIN A1C  09/21/2015  . TETANUS/TDAP  01/05/2019  . PNEUMOCOCCAL POLYSACCHARIDE VACCINE  Completed  . HIV Screening  Completed   Immunization History  Administered Date(s) Administered  . PPD Test 06/15/2014  . Pneumococcal Polysaccharide-23 01/05/2009, 08/10/2014  . Td 01/05/2009   Past Surgical History  Procedure Laterality Date  . Tonsillectomy    . Knee surgery    . Back surgery    . Coronary angioplasty with stent  placement      about 8 years ago at [facility]  . Colonoscopy  02/15/2009  . Coronary artery bypass graft  04/02/2012    Procedure: CORONARY ARTERY BYPASS GRAFTING (CABG);  Surgeon: Rexene Alberts, MD;  Location: [facility];  Service: Open Heart Surgery;  Laterality: N/A;  On pump, times three graphs using endoscopically harvested right greater saphenous vein and left internal mammary artery.    Family History  Problem Relation Age of Onset  . Colon cancer Father   . Hypertension Mother   . Alzheimer's disease Mother   . Colon cancer Paternal Grandmother    History   Social History  . Marital Status: Divorced    Spouse Name: N/A  . Number of Children: 0  . Years of Education: N/A   Occupational History  . 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    Social History Main Topics  . Smoking status:  Current Every Day Smoker -- 2.00 packs/day for 40 years    Types: Cigarettes  . Smokeless tobacco: Never Used     Comment: down from 2ppd  . Alcohol Use: 1.2 oz/week    0 Cans of beer, 1 Shots of liquor, 0 Standard drinks or equivalent, 1 Glasses of wine per week     Comment: Rarely  . Drug Use: No  . Sexual Activity: Not Currently     ROS Constitutional: Denies fever, chills, weight loss/gain, headaches, insomnia,  night sweats or change in appetite. Does c/o fatigue. Eyes: Denies redness, blurred vision, diplopia, discharge, itchy or watery eyes.  ENT: Denies discharge, congestion, post nasal drip, epistaxis, sore throat, earache, hearing loss, dental pain, Tinnitus, Vertigo, Sinus pain or snoring.  Cardio: Denies chest pain, palpitations, irregular heartbeat, syncope, dyspnea, diaphoresis, orthopnea, PND, claudication or edema Respiratory: denies cough, dyspnea, DOE, pleurisy, hoarseness, laryngitis or wheezing.  Gastrointestinal: Denies dysphagia, heartburn, reflux, water brash, pain, cramps, nausea, vomiting, bloating, diarrhea, constipation, hematemesis, melena, hematochezia, jaundice or hemorrhoids Genitourinary: Denies dysuria, frequency, urgency, nocturia, hesitancy, discharge, hematuria or flank pain Musculoskeletal: Denies arthralgia, myalgia, stiffness, Jt. Swelling, pain, limp or strain/sprain. Denies Falls. Skin: Denies puritis, rash, hives, warts, acne, eczema or change in skin lesion Neuro: No weakness, tremor, incoordination, spasms, paresthesia or pain Psychiatric: Denies confusion, memory loss or sensory loss. Denies Depression. Endocrine: Denies change in weight, skin, hair change, nocturia, and paresthesia, diabetic polys, visual blurring or hyper / hypo glycemic episodes.  Heme/Lymph: No excessive bleeding, bruising or enlarged lymph nodes.  Physical Exam  BP 138/88  Pulse 60  Temp 98.6 F   Resp 18  Ht 6' 2.25"   Wt 275 lb    BMI 35.09  General Appearance:  Well nourished, in no apparent distress. Eyes: PERRLA, EOMs, conjunctiva no swelling or erythema, normal fundi and vessels. Sinuses: No frontal/maxillary tenderness ENT/Mouth: EACs patent / TMs  nl. Nares clear without erythema, swelling, mucoid exudates. Oral hygiene is good. No erythema, swelling, or exudate. Tongue normal, non-obstructing. Tonsils not swollen or erythematous. Hearing normal.  Neck: Supple, thyroid normal. No bruits, nodes or JVD. Respiratory: Respiratory effort normal.  BS equal and clear bilateral without rales, rhonci, wheezing or stridor. Cardio: Heart sounds are normal with regular rate and rhythm and no murmurs, rubs or gallops. Peripheral pulses are normal and equal bilaterally without edema. No aortic or femoral bruits. Chest: symmetric with normal excursions and percussion.  Abdomen: Flat, soft, with bowel sounds. Nontender, no guarding, rebound, hernias, masses, or organomegaly.  Lymphatics: Non tender without lymphadenopathy.  Genitourinary:  Patient refused DRE and accepts responsibility for any negative consequences there of.  Musculoskeletal: Full ROM all peripheral extremities, joint stability, 5/5 strength, and normal gait. Skin: Warm and dry without rashes, lesions, cyanosis, clubbing or  ecchymosis.  Neuro: Cranial nerves intact, reflexes equal bilaterally. Normal muscle tone, no cerebellar symptoms. Sensation intact.  Pysch: Awake and oriented X 3 with normal affect, insight and judgment appropriate.   Assessment and Plan  1. Essential hypertension  - EKG 12-Lead - [place], RETROPERITNL ABD,  LTD - TSH - losartan (COZAAR) 100 MG tablet; Take 1/2 to 1 tablet daily for BP  Dispense: 30 tablet; Refill: 5  2. Hyperlipidemia  - Lipid panel  3. 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 Vitamin D deficiency  - Vit D  25 hydroxy   5. Obesity   6. Gastroesophageal reflux disease   7. ASCAD s/p MI & PTCA/Stents then CABG   8. Screening for rectal cancer  - POC Hemoccult Bld/Stl   9. Prostate cancer screening  - PSA  10. Other fatigue  - Vitamin B12 - Testosterone - Iron and TIBC - TSH  11. Medication management  - Urine Microscopic - CBC with Differential/Platelet - BASIC METABOLIC PANEL WITH GFR - Hepatic function panel - Magnesium  12. BMI 35.0-35.9,adult   13. Pain in joint, shoulder region, right  - predniSONE (DELTASONE) 20 MG tablet; 1 tab 3 x day for 3 days, then 1 tab 2 x day for 3 days, then 1 tab 1 x day for 5 days  Dispense: 20 tablet; Refill: 1   Continue prudent diet as discussed, weight control, BP monitoring, regular exercise, and medications as discussed.  Discussed med effects and SE's. Routine screening labs and tests as requested with regular follow-up as recommended.  Over 40 minutes of exam, counseling &  chart review was performed

## 2015-06-17 NOTE — Patient Instructions (Signed)
Recommend Adult Low dose Aspirin or baby Aspirin 81 mg daily   To reduce risk of Colon Cancer 20 %,   Skin Cancer 26 % ,   Melanoma 46%   and   Pancreatic cancer 60%  ++++++++++++++++++  Vitamin D goal is between 70-100.   Please make sure that you are taking your Vitamin D as directed.   It is very important as a natural anti-inflammatory   helping hair, skin, and nails, as well as reducing stroke and heart attack risk.   It helps your bones and helps with mood.  It also decreases numerous cancer risks so please take it as directed.   Low Vit D is associated with a 200-300% higher risk for CANCER   and 200-300% higher risk for HEART   ATTACK  &  STROKE.    ......................................  It is also associated with higher death rate at younger ages,   autoimmune diseases like Rheumatoid arthritis, Lupus, Multiple Sclerosis.     Also many other serious conditions, like depression, Alzheimer's  Dementia, infertility, muscle aches, fatigue, fibromyalgia - just to name a few.  +++++++++++++++++++    Recommend the book "The END of DIETING" by Dr Joel Fuhrman   & the book "The END of DIABETES " by Dr Joel Fuhrman  At [employer] - get book & Audio CD's     Being diabetic has a  300% increased risk for heart attack, stroke, cancer, and alzheimer- type vascular dementia. It is very important that you work harder with diet by avoiding all foods that are white. Avoid white rice (brown & wild rice is OK), white potatoes (sweetpotatoes in moderation is OK), White bread or wheat bread or anything made out of white flour like bagels, donuts, rolls, buns, biscuits, cakes, pastries, cookies, pizza crust, and pasta (made from white flour & egg whites) - vegetarian pasta or spinach or wheat pasta is OK. Multigrain breads like Arnold's or Pepperidge [employer], or multigrain sandwich thins or flatbreads.  Diet, exercise and weight loss can reverse and cure diabetes in the early  stages.  Diet, exercise and weight loss is very important in the control and prevention of complications of diabetes which affects every system in your body, ie. Brain - dementia/stroke, eyes - glaucoma/blindness, heart - heart attack/heart failure, kidneys - dialysis, stomach - gastric paralysis, intestines - malabsorption, nerves - severe painful neuritis, circulation - gangrene & loss of a leg(s), and finally cancer and Alzheimers.    I recommend avoid fried & greasy foods,  sweets/candy, white rice (brown or wild rice or Quinoa is OK), white potatoes (sweet potatoes are OK) - anything made from white flour - bagels, doughnuts, rolls, buns, biscuits,white and wheat breads, pizza crust and traditional pasta made of white flour & egg white(vegetarian pasta or spinach or wheat pasta is OK).  Multi-grain bread is OK - like multi-grain flat bread or sandwich thins. Avoid alcohol in excess. Exercise is also important.    Eat all the vegetables you want - avoid meat, especially red meat and dairy - especially cheese.  Cheese is the most concentrated form of trans-fats which is the worst thing to clog up our arteries. Veggie cheese is OK which can be found in the fresh produce section at [employer] or [employer] or [employer]  ++++++++++++++++++++++++++  Preventive Care for Adults  A healthy lifestyle and preventive care can promote health and wellness. Preventive health guidelines for men include the following key practices:  A routine yearly physical is   a good way to check with your health care provider about your health and preventative screening. It is a chance to share any concerns and updates on your health and to receive a thorough exam.  Visit your dentist for a routine exam and preventative care every 6 months. Brush your teeth twice a day and floss once a day. Good oral hygiene prevents tooth decay and gum disease.  The frequency of eye exams is based on your age, health, family medical  history, use of contact lenses, and other factors. Follow your health care provider's recommendations for frequency of eye exams.  Eat a healthy diet. Foods such as vegetables, fruits, whole grains, low-fat dairy products, and lean protein foods contain the nutrients you need without too many calories. Decrease your intake of foods high in solid fats, added sugars, and salt. Eat the right amount of calories for you.Get information about a proper diet from your health care provider, if necessary.  Regular physical exercise is one of the most important things you can do for your health. Most adults should get at least 150 minutes of moderate-intensity exercise (any activity that increases your heart rate and causes you to sweat) each week. In addition, most adults need muscle-strengthening exercises on 2 or more days a week.  Maintain a healthy weight. The body mass index (BMI) is a screening tool to identify possible weight problems. It provides an estimate of body fat based on height and weight. Your health care provider can find your BMI and can help you achieve or maintain a healthy weight.For adults 20 years and older:  A BMI below 18.5 is considered underweight.  A BMI of 18.5 to 24.9 is normal.  A BMI of 25 to 29.9 is considered overweight.  A BMI of 30 and above is considered obese.  Maintain normal blood lipids and cholesterol levels by exercising and minimizing your intake of saturated fat. Eat a balanced diet with plenty of fruit and vegetables. Blood tests for lipids and cholesterol should begin at age 20 and be repeated every 5 years. If your lipid or cholesterol levels are high, you are over 50, or you are at high risk for heart disease, you may need your cholesterol levels checked more frequently.Ongoing high lipid and cholesterol levels should be treated with medicines if diet and exercise are not working.  If you smoke, find out from your health care provider how to quit. If you  do not use tobacco, do not start.  Lung cancer screening is recommended for adults aged 55-80 years who are at high risk for developing lung cancer because of a history of smoking. A yearly low-dose CT scan of the lungs is recommended for people who have at least a 30-pack-year history of smoking and are a current smoker or have quit within the past 15 years. A pack year of smoking is smoking an average of 1 pack of cigarettes a day for 1 year (for example: 1 pack a day for 30 years or 2 packs a day for 15 years). Yearly screening should continue until the smoker has stopped smoking for at least 15 years. Yearly screening should be stopped for people who develop a health problem that would prevent them from having lung cancer treatment.  If you choose to drink alcohol, do not have more than 2 drinks per day. One drink is considered to be 12 ounces (355 mL) of beer, 5 ounces (148 mL) of wine, or 1.5 ounces (44 mL) of liquor.    Avoid use of street drugs. Do not share needles with anyone. Ask for help if you need support or instructions about stopping the use of drugs.  High blood pressure causes heart disease and increases the risk of stroke. Your blood pressure should be checked at least every 1-2 years. Ongoing high blood pressure should be treated with medicines, if weight loss and exercise are not effective.  If you are 45-79 years old, ask your health care provider if you should take aspirin to prevent heart disease.  Diabetes screening involves taking a blood sample to check your fasting blood sugar level. This should be done once every 3 years, after age 45, if you are within normal weight and without risk factors for diabetes. Testing should be considered at a younger age or be carried out more frequently if you are overweight and have at least 1 risk factor for diabetes.  Colorectal cancer can be detected and often prevented. Most routine colorectal cancer screening begins at the age of 50 and  continues through age 75. However, your health care provider may recommend screening at an earlier age if you have risk factors for colon cancer. On a yearly basis, your health care provider may provide home test kits to check for hidden blood in the stool. Use of a small camera at the end of a tube to directly examine the colon (sigmoidoscopy or colonoscopy) can detect the earliest forms of colorectal cancer. Talk to your health care provider about this at age 50, when routine screening begins. Direct exam of the colon should be repeated every 5-10 years through age 75, unless early forms of precancerous polyps or small growths are found.   Talk with your health care provider about prostate cancer screening.  Testicular cancer screening isrecommended for adult males. Screening includes self-exam, a health care provider exam, and other screening tests. Consult with your health care provider about any symptoms you have or any concerns you have about testicular cancer.  Use sunscreen. Apply sunscreen liberally and repeatedly throughout the day. You should seek shade when your shadow is shorter than you. Protect yourself by wearing long sleeves, pants, a wide-brimmed hat, and sunglasses year round, whenever you are outdoors.  Once a month, do a whole-body skin exam, using a mirror to look at the skin on your back. Tell your health care provider about new moles, moles that have irregular borders, moles that are larger than a pencil eraser, or moles that have changed in shape or color.  Stay current with required vaccines (immunizations).  Influenza vaccine. All adults should be immunized every year.  Tetanus, diphtheria, and acellular pertussis (Td, Tdap) vaccine. An adult who has not previously received Tdap or who does not know his vaccine status should receive 1 dose of Tdap. This initial dose should be followed by tetanus and diphtheria toxoids (Td) booster doses every 10 years. Adults with an unknown  or incomplete history of completing a 3-dose immunization series with Td-containing vaccines should begin or complete a primary immunization series including a Tdap dose. Adults should receive a Td booster every 10 years.  Varicella vaccine. An adult without evidence of immunity to varicella should receive 2 doses or a second dose if he has previously received 1 dose.  Human papillomavirus (HPV) vaccine. Males aged 13-21 years who have not received the vaccine previously should receive the 3-dose series. Males aged 22-26 years may be immunized. Immunization is recommended through the age of 26 years for any male who has sex   with males and did not get any or all doses earlier. Immunization is recommended for any person with an immunocompromised condition through the age of 26 years if he did not get any or all doses earlier. During the 3-dose series, the second dose should be obtained 4-8 weeks after the first dose. The third dose should be obtained 24 weeks after the first dose and 16 weeks after the second dose.  Zoster vaccine. One dose is recommended for adults aged 60 years or older unless certain conditions are present.    PREVNAR  - Pneumococcal 13-valent conjugate (PCV13) vaccine. When indicated, a person who is uncertain of his immunization history and has no record of immunization should receive the PCV13 vaccine. An adult aged 19 years or older who has certain medical conditions and has not been previously immunized should receive 1 dose of PCV13 vaccine. This PCV13 should be followed with a dose of pneumococcal polysaccharide (PPSV23) vaccine. The PPSV23 vaccine dose should be obtained at least 8 weeks after the dose of PCV13 vaccine. An adult aged 19 years or older who has certain medical conditions and previously received 1 or more doses of PPSV23 vaccine should receive 1 dose of PCV13. The PCV13 vaccine dose should be obtained 1 or more years after the last PPSV23 vaccine  dose.    PNEUMOVAX - Pneumococcal polysaccharide (PPSV23) vaccine. When PCV13 is also indicated, PCV13 should be obtained first. All adults aged 65 years and older should be immunized. An adult younger than age 65 years who has certain medical conditions should be immunized. Any person who resides in a nursing home or long-term care facility should be immunized. An adult smoker should be immunized. People with an immunocompromised condition and certain other conditions should receive both PCV13 and PPSV23 vaccines. People with human immunodeficiency virus (HIV) infection should be immunized as soon as possible after diagnosis. Immunization during chemotherapy or radiation therapy should be avoided. Routine use of PPSV23 vaccine is not recommended for American Indians, [place] Natives, or people younger than 65 years unless there are medical conditions that require PPSV23 vaccine. When indicated, people who have unknown immunization and have no record of immunization should receive PPSV23 vaccine. One-time revaccination 5 years after the first dose of PPSV23 is recommended for people aged 19-64 years who have chronic kidney failure, nephrotic syndrome, asplenia, or immunocompromised conditions. People who received 1-2 doses of PPSV23 before age 65 years should receive another dose of PPSV23 vaccine at age 65 years or later if at least 5 years have passed since the previous dose. Doses of PPSV23 are not needed for people immunized with PPSV23 at or after age 65 years.    Hepatitis A vaccine. Adults who wish to be protected from this disease, have certain high-risk conditions, work with hepatitis A-infected animals, work in hepatitis A research labs, or travel to or work in countries with a high rate of hepatitis A should be immunized. Adults who were previously unvaccinated and who anticipate close contact with an international adoptee during the first 60 days after arrival in the [place] from a country  with a high rate of hepatitis A should be immunized.    Hepatitis B vaccine. Adults should be immunized if they wish to be protected from this disease, have certain high-risk conditions, may be exposed to blood or other infectious body fluids, are household contacts or sex partners of hepatitis B positive people, are clients or workers in certain care facilities, or travel to or work in   countries with a high rate of hepatitis B.   Preventive Service / Frequency   Ages 40 to 64  Blood pressure check.  Lipid and cholesterol check  Lung cancer screening. / Every year if you are aged 55-80 years and have a 30-pack-year history of smoking and currently smoke or have quit within the past 15 years. Yearly screening is stopped once you have quit smoking for at least 15 years or develop a health problem that would prevent you from having lung cancer treatment.  Fecal occult blood test (FOBT) of stool. / Every year beginning at age 50 and continuing until age 75. You may not have to do this test if you get a colonoscopy every 10 years.  Flexible sigmoidoscopy** or colonoscopy.** / Every 5 years for a flexible sigmoidoscopy or every 10 years for a colonoscopy beginning at age 50 and continuing until age 75. Screening for abdominal aortic aneurysm (AAA)  by ultrasound is recommended for people who have history of high blood pressure or who are current or former smokers. 

## 2015-06-18 LAB — IRON AND TIBC
%SAT: 23 % (ref 20–55)
Iron: 81 ug/dL (ref 42–165)
TIBC: 351 ug/dL (ref 215–435)
UIBC: 270 ug/dL (ref 125–400)

## 2015-06-18 LAB — HEPATIC FUNCTION PANEL
ALT: 15 U/L (ref 0–53)
AST: 16 U/L (ref 0–37)
Albumin: 3.8 g/dL (ref 3.5–5.2)
Alkaline Phosphatase: 104 U/L (ref 39–117)
Bilirubin, Direct: 0.2 mg/dL (ref 0.0–0.3)
Indirect Bilirubin: 0.5 mg/dL (ref 0.2–1.2)
Total Bilirubin: 0.7 mg/dL (ref 0.2–1.2)
Total Protein: 6.1 g/dL (ref 6.0–8.3)

## 2015-06-18 LAB — URINALYSIS, MICROSCOPIC ONLY
Bacteria, UA: NONE SEEN
Casts: NONE SEEN
Crystals: NONE SEEN
Squamous Epithelial / LPF: NONE SEEN

## 2015-06-18 LAB — CBC WITH DIFFERENTIAL/PLATELET
Basophils Absolute: 0.1 10*3/uL (ref 0.0–0.1)
Basophils Relative: 1 % (ref 0–1)
Eosinophils Absolute: 0.2 10*3/uL (ref 0.0–0.7)
Eosinophils Relative: 3 % (ref 0–5)
HCT: 44.9 % (ref 39.0–52.0)
Hemoglobin: 15.2 g/dL (ref 13.0–17.0)
Lymphocytes Relative: 26 % (ref 12–46)
Lymphs Abs: 1.3 10*3/uL (ref 0.7–4.0)
MCH: 29.1 pg (ref 26.0–34.0)
MCHC: 33.9 g/dL (ref 30.0–36.0)
MCV: 85.9 fL (ref 78.0–100.0)
MPV: 11.2 fL (ref 8.6–12.4)
Monocytes Absolute: 0.5 10*3/uL (ref 0.1–1.0)
Monocytes Relative: 9 % (ref 3–12)
Neutro Abs: 3.1 10*3/uL (ref 1.7–7.7)
Neutrophils Relative %: 61 % (ref 43–77)
Platelets: 200 10*3/uL (ref 150–400)
RBC: 5.23 MIL/uL (ref 4.22–5.81)
RDW: 14.6 % (ref 11.5–15.5)
WBC: 5.1 10*3/uL (ref 4.0–10.5)

## 2015-06-18 LAB — BASIC METABOLIC PANEL WITH GFR
BUN: 7 mg/dL (ref 6–23)
CO2: 28 mEq/L (ref 19–32)
Calcium: 9 mg/dL (ref 8.4–10.5)
Chloride: 98 mEq/L (ref 96–112)
Creat: 1.07 mg/dL (ref 0.50–1.35)
GFR, Est African American: 86 mL/min
GFR, Est Non African American: 75 mL/min
Glucose, Bld: 95 mg/dL (ref 70–99)
Potassium: 4.6 mEq/L (ref 3.5–5.3)
Sodium: 136 mEq/L (ref 135–145)

## 2015-06-18 LAB — LIPID PANEL
Cholesterol: 103 mg/dL (ref 0–200)
HDL: 51 mg/dL (ref >=40)
LDL Cholesterol: 35 mg/dL (ref 0–99)
Total CHOL/HDL Ratio: 2 Ratio
Triglycerides: 85 mg/dL (ref <150)
VLDL: 17 mg/dL (ref 0–40)

## 2015-06-18 LAB — HEMOGLOBIN A1C
Hgb A1c MFr Bld: 6.9 % — ABNORMAL HIGH (ref <5.7)
Mean Plasma Glucose: 151 mg/dL — ABNORMAL HIGH (ref <117)

## 2015-06-18 LAB — MICROALBUMIN / CREATININE URINE RATIO
Creatinine, Urine: 128.3 mg/dL
Microalb Creat Ratio: 6.2 mg/g (ref 0.0–30.0)
Microalb, Ur: 0.8 mg/dL (ref <2.0)

## 2015-06-18 LAB — PSA: PSA: 0.89 ng/mL (ref <=4.00)

## 2015-06-18 LAB — TSH: TSH: 6.992 u[IU]/mL — ABNORMAL HIGH (ref 0.350–4.500)

## 2015-06-18 LAB — VITAMIN B12: Vitamin B-12: 1642 pg/mL — ABNORMAL HIGH (ref 211–911)

## 2015-06-18 LAB — VITAMIN D 25 HYDROXY (VIT D DEFICIENCY, FRACTURES): Vit D, 25-Hydroxy: 75 ng/mL (ref 30–100)

## 2015-06-18 LAB — TESTOSTERONE: Testosterone: 499 ng/dL (ref 300–890)

## 2015-06-18 LAB — INSULIN, RANDOM: Insulin: 4.8 u[IU]/mL (ref 2.0–19.6)

## 2015-06-18 LAB — MAGNESIUM: Magnesium: 1.7 mg/dL (ref 1.5–2.5)

## 2015-06-20 ENCOUNTER — Other Ambulatory Visit: Payer: Self-pay | Admitting: Internal Medicine

## 2015-06-20 DIAGNOSIS — E038 Other specified hypothyroidism: Secondary | ICD-10-CM

## 2015-06-20 MED ORDER — LEVOTHYROXINE SODIUM 100 MCG PO TABS: 100.0000 ug | ORAL_TABLET | Freq: Every day | ORAL | Status: DC

## 2015-06-23 ENCOUNTER — Encounter: Payer: Self-pay | Admitting: Internal Medicine

## 2015-07-11 ENCOUNTER — Encounter: Payer: Self-pay | Admitting: Internal Medicine

## 2015-07-13 ENCOUNTER — Encounter: Payer: Self-pay | Admitting: Physician Assistant

## 2015-07-13 ENCOUNTER — Ambulatory Visit (INDEPENDENT_AMBULATORY_CARE_PROVIDER_SITE_OTHER): Payer: No Typology Code available for payment source | Admitting: Physician Assistant

## 2015-07-13 VITALS — BP 140/70 | HR 64 | Temp 97.8°F | Resp 14 | Wt 271.0 lb

## 2015-07-13 DIAGNOSIS — M791 Myalgia, unspecified site: Secondary | ICD-10-CM

## 2015-07-13 DIAGNOSIS — E559 Vitamin D deficiency, unspecified: Secondary | ICD-10-CM

## 2015-07-13 DIAGNOSIS — D649 Anemia, unspecified: Secondary | ICD-10-CM

## 2015-07-13 DIAGNOSIS — M25562 Pain in left knee: Secondary | ICD-10-CM

## 2015-07-13 DIAGNOSIS — J209 Acute bronchitis, unspecified: Secondary | ICD-10-CM

## 2015-07-13 LAB — CBC WITH DIFFERENTIAL/PLATELET
Basophils Absolute: 0 10*3/uL (ref 0.0–0.1)
Basophils Relative: 0 % (ref 0–1)
Eosinophils Absolute: 0.1 10*3/uL (ref 0.0–0.7)
Eosinophils Relative: 1 % (ref 0–5)
HCT: 42 % (ref 39.0–52.0)
Hemoglobin: 14.6 g/dL (ref 13.0–17.0)
Lymphocytes Relative: 12 % (ref 12–46)
Lymphs Abs: 1.1 10*3/uL (ref 0.7–4.0)
MCH: 29.7 pg (ref 26.0–34.0)
MCHC: 34.8 g/dL (ref 30.0–36.0)
MCV: 85.4 fL (ref 78.0–100.0)
MPV: 10.5 fL (ref 8.6–12.4)
Monocytes Absolute: 0.7 10*3/uL (ref 0.1–1.0)
Monocytes Relative: 8 % (ref 3–12)
Neutro Abs: 7.2 10*3/uL (ref 1.7–7.7)
Neutrophils Relative %: 79 % — ABNORMAL HIGH (ref 43–77)
Platelets: 278 10*3/uL (ref 150–400)
RBC: 4.92 MIL/uL (ref 4.22–5.81)
RDW: 14.3 % (ref 11.5–15.5)
WBC: 9.1 10*3/uL (ref 4.0–10.5)

## 2015-07-13 LAB — BASIC METABOLIC PANEL WITH GFR
BUN: 9 mg/dL (ref 7–25)
CO2: 26 mEq/L (ref 20–31)
Calcium: 8.9 mg/dL (ref 8.6–10.3)
Chloride: 93 mEq/L — ABNORMAL LOW (ref 98–110)
Creat: 1.06 mg/dL (ref 0.70–1.25)
GFR, Est African American: 87 mL/min (ref >=60)
GFR, Est Non African American: 75 mL/min (ref >=60)
Glucose, Bld: 145 mg/dL — ABNORMAL HIGH (ref 65–99)
Potassium: 5 mEq/L (ref 3.5–5.3)
Sodium: 130 mEq/L — ABNORMAL LOW (ref 135–146)

## 2015-07-13 LAB — HEPATIC FUNCTION PANEL
ALT: 34 U/L (ref 9–46)
AST: 30 U/L (ref 10–35)
Albumin: 3.9 g/dL (ref 3.6–5.1)
Alkaline Phosphatase: 92 U/L (ref 40–115)
Bilirubin, Direct: 0.2 mg/dL (ref <=0.2)
Indirect Bilirubin: 0.6 mg/dL (ref 0.2–1.2)
Total Bilirubin: 0.8 mg/dL (ref 0.2–1.2)
Total Protein: 6.5 g/dL (ref 6.1–8.1)

## 2015-07-13 LAB — MAGNESIUM: Magnesium: 2 mg/dL (ref 1.5–2.5)

## 2015-07-13 LAB — URIC ACID: Uric Acid, Serum: 3.3 mg/dL — ABNORMAL LOW (ref 4.0–7.8)

## 2015-07-13 LAB — IRON AND TIBC
%SAT: 10 % — ABNORMAL LOW (ref 20–55)
Iron: 32 ug/dL — ABNORMAL LOW (ref 42–165)
TIBC: 333 ug/dL (ref 215–435)
UIBC: 301 ug/dL (ref 125–400)

## 2015-07-13 LAB — CK: Total CK: 773 U/L — ABNORMAL HIGH (ref 7–232)

## 2015-07-13 MED ORDER — PREDNISONE 20 MG PO TABS: ORAL_TABLET | ORAL | Status: DC

## 2015-07-13 MED ORDER — PROMETHAZINE-CODEINE 6.25-10 MG/5ML PO SYRP: ORAL_SOLUTION | ORAL | Status: DC

## 2015-07-13 MED ORDER — AZITHROMYCIN 250 MG PO TABS: ORAL_TABLET | ORAL | Status: AC

## 2015-07-13 MED ORDER — BENZONATATE 100 MG PO CAPS: 200.0000 mg | ORAL_CAPSULE | Freq: Three times a day (TID) | ORAL | Status: DC | PRN

## 2015-07-13 NOTE — Patient Instructions (Signed)
Muscle aches Stop the statin for 1 week and then start back after we get the lab back. We are getting a lab that will look for muscle break down with the statin.  We will check your potassium, magnesium to see if these are low which can cause muscle aches.  Please make sure you are taking 64 oz of water a day as long as you do now have a heart condition.  -Please take Tylenol or Aleve for pain. -You can take tylenol (562m) or tylenol arthritis (6534m. The max you can take of tylenol a day is 300090maily, this is a max of 6 pills a day of the regular tyelnol (500m55mr a max of 4 a day of the tylenol arthritis (650mg6m long as no other medications you are taking contain tylenol.  -Aleve/Naproxen Sodium- can take 1-2 in the morning and 1 at night. Max 3 a day. Take with food to avoid ulcers. Does affect your kidney function so use sparingly.   If you have had tick exposure, we can check for lyme disease or [place] spotted fever.   Muscle Pain Muscle pain (myalgia) may be caused by many things, including:  Overuse or muscle strain, especially if you are not in shape. This is the most common cause of muscle pain.  Injury.  Bruises.  Viruses, such as the flu.  Infectious diseases.  Fibromyalgia, which is a chronic condition that causes muscle tenderness, fatigue, and headache.  Autoimmune diseases, including lupus.  Certain drugs, including ACE inhibitors and statins. Muscle pain may be mild or severe. In most cases, the pain lasts only a short time and goes away without treatment. To diagnose the cause of your muscle pain, your health care provider will take your medical history. This means he or she will ask you when your muscle pain began and what has been happening. If you have not had muscle pain for very long, your health care provider may want to wait before doing much testing. If your muscle pain has lasted a long time, your health care provider may want to run tests right  away. If your health care provider thinks your muscle pain may be caused by illness, you may need to have additional tests to rule out certain conditions.  Treatment for muscle pain depends on the cause. Home care is often enough to relieve muscle pain. Your health care provider may also prescribe anti-inflammatory medicine. HOME CARE INSTRUCTIONS Watch your condition for any changes. The following actions may help to lessen any discomfort you are feeling:  Only take over-the-counter or prescription medicines as directed by your health care provider.  Apply ice to the sore muscle:  Put ice in a plastic bag.  Place a towel between your skin and the bag.  Leave the ice on for 15-20 minutes, 3-4 times a day.  You may alternate applying hot and cold packs to the muscle as directed by your health care provider.  If overuse is causing your muscle pain, slow down your activities until the pain goes away.  Remember that it is normal to feel some muscle pain after starting a workout program. Muscles that have not been used often will be sore at first.  Do regular, gentle exercises if you are not usually active.  Warm up before exercising to lower your risk of muscle pain.  Do not continue working out if the pain is very bad. Bad pain could mean you have injured a muscle. SEEK [place]  IF:  Your muscle pain gets worse, and medicines do not help.  You have muscle pain that lasts longer than 3 days.  You have a rash or fever along with muscle pain.  You have muscle pain after a tick bite.  You have muscle pain while working out, even though you are in good physical condition.  You have redness, soreness, or swelling along with muscle pain.  You have muscle pain after starting a new medicine or changing the dose of a medicine. SEEK IMMEDIATE MEDICAL CARE IF:  You have trouble breathing.  You have trouble swallowing.  You have muscle pain along with a stiff neck, fever, and  vomiting.  You have severe muscle weakness or cannot move part of your body. MAKE SURE YOU:   Understand these instructions.  Will watch your condition.  Will get help right away if you are not doing well or get worse. Document Released: 10/26/2006 Document Revised: 12/09/2013 Document Reviewed: 09/30/2013 [facility] Patient Information 2015 [place], [place]. This information is not intended to replace advice given to you by your health care provider. Make sure you discuss any questions you have with your health care provider.   I will give you a prescription for an antibiotic, but please only take it if you are not feeling better in 7-10 days.  Bronchitis is mostly caused by viruses and the antibiotic will do nothing.  PLEASE TRY TO DO OVER THE COUNTER TREATMENT AND/OR PREDNISONE FOR 5-7 DAYS AND IF YOU ARE NOT GETTING BETTER OR GETTING WORSE THEN YOU CAN START ON AN ANTIBIOTIC GIVEN.  Can take the prednisone AT NIGHT WITH DINNER, it take 8-12 hours to start working so it will NOT affect your sleeping if you take it at night with your food!! Take two pills the first night and 1 or two pill the second night and then 1 pill the other nights.    Rest and stay hydrated.  Make sure you drink plenty of fluids to make sure urine is clear when you urinate.  Water will help thin out mucous. - Take Mucinex DM- Maximum Strength over the counter to thin out and cough up the thick mucous.  Please follow directions on box. -Take Albuterol if prescribed.  Risk of antibiotic use: About 1 in 4 people who take antibiotics have side effects including stomach problems, dizziness, or rashes. Those problems clear up soon after stopping the drugs, but in rare cases antibiotics can cause severe allergic reaction. Over use of antibiotics also encourages the growth of bacteria that can't be controlled easily with drugs. That makes you more vunerable to antibiotic-resistant infections and undermines the benefits of  antibiotics for others.   Waste of Money: Antibiotics often aren't very expensive, but any money spent on unnecessary drugs is money down the drain.   When are antibiotics needed? Only when symptoms last longer than a week.  Start to improve but then worsen again  Please call the office or message through My Chart if you have any questions.   Acute Bronchitis Bronchitis is when the airways that extend from the windpipe into the lungs get red, puffy, and painful (inflamed). Bronchitis often causes thick spit (mucus) to develop. This leads to a cough. A cough is the most common symptom of bronchitis. In acute bronchitis, the condition usually begins suddenly and goes away over time (usually in 2 weeks). Smoking, allergies, and asthma can make bronchitis worse. Repeated episodes of bronchitis may cause more lung problems.  Most common cause  of Bronchitis is viruses (rhinovirus, coronavirus, RSV).  Therefore, not requiring an antibiotic; as antibiotics only treat bacterial infections.  HOME CARE  Rest.  Drink enough fluids to keep your pee (urine) clear or pale yellow (unless you need to limit fluids as told by your doctor).  Only take over-the-counter or prescription medicines as told by your doctor.  Avoid smoking and secondhand smoke. These can make bronchitis worse. If you are a smoker, think about using nicotine gum or skin patches. Quitting smoking will help your lungs heal faster.  Reduce the chance of getting bronchitis again by:  Washing your hands often.  Avoiding people with cold symptoms.  Trying not to touch your hands to your mouth, nose, or eyes.  Follow up with your doctor as told. GET HELP IF: Your symptoms do not improve after 1 week of treatment. Symptoms include:  Cough.  Fever.  Coughing up thick spit.  Body aches.  Chest congestion.  Chills.  Shortness of breath.  Sore throat. GET HELP RIGHT AWAY IF:   You have an increased fever.  You have  chills.  You have severe shortness of breath.  You have bloody thick spit (sputum).  You throw up (vomit) often.  You lose too much body fluid (dehydration).  You have a severe headache.  You faint. MAKE SURE YOU:   Understand these instructions.  Will watch your condition.  Will get help right away if you are not doing well or get worse. Document Released: 05/22/2008 Document Revised: 08/06/2013 Document Reviewed: 05/27/2013 [facility] Patient Information 2015 [place], [place]. This information is not intended to replace advice given to you by your health care provider. Make sure you discuss any questions you have with your health care provider.

## 2015-07-13 NOTE — Progress Notes (Addendum)
Subjective:    Patient ID: Curtis Perez, male    DOB: Sep 07, 1951, 64 y.o.   MRN: [id number]  HPI 64 y.o. 21 pack year smoker WM with history of HTN, chol, CAD s/p CABG, DM 2 with CKD presents with cough x 10 days. Productive cough with yellow sputum, has history of rectus sheath hematoma from coughing and he would like to avoid this denies CP, SOB, fever, chills. He has been having aching in his legs, was better after change in meds but has come back with restart on lipitor, no weakness in his legs, pain is worse at night, at rest. Some left knee pain x 1 week. . Last CXR was 07/2014  Blood pressure 140/70, pulse 64, temperature 97.8 F (36.6 C), temperature source Temporal, resp. rate 14, weight 271 lb (122.925 kg), SpO2 98 %. Current Outpatient Prescriptions on File Prior to Visit  Medication Sig Dispense Refill  . atenolol (TENORMIN) 50 MG tablet TAKE 1 TABLET BY MOUTH DAILY 30 tablet 3  . atorvastatin (LIPITOR) 80 MG tablet TAKE 1 TABLET (80 MG TOTAL) BY MOUTH DAILY. FOR CHOLESTEROL 30 tablet 5  . Cyanocobalamin (VITAMIN B-12 PO) Take 1,000 mcg by mouth daily.     Marland Kitchen levothyroxine (SYNTHROID) 100 MCG tablet Take 1 tablet (100 mcg total) by mouth daily before breakfast. 90 tablet 1  . losartan (COZAAR) 100 MG tablet Take 1/2 to 1 tablet daily for BP 30 tablet 5  . Magnesium 400 MG CAPS Take 400 mg by mouth. Takes 4 per day    . metFORMIN (GLUCOPHAGE-XR) 500 MG 24 hr tablet Take 1,000 mg by mouth 3 (three) times daily. Take 2 tablet (1000mg ) with breakfast  and 2 tablets (1000mg ) at supper    . Omega-3 Fatty Acids (FISH OIL PO) Take 2,000 mg by mouth daily.    Marland Kitchen omeprazole (PRILOSEC) 40 MG capsule Take 40 mg by mouth 2 (two) times daily.    . predniSONE (DELTASONE) 20 MG tablet 1 tab 3 x day for 3 days, then 1 tab 2 x day for 3 days, then 1 tab 1 x day for 5 days 20 tablet 1  . Vitamin D, Ergocalciferol, (DRISDOL) 50000 UNITS CAPS capsule Take 1 capsule (50,000 Units total) by mouth 3  (three) times a week. 30 capsule 3  . ZETIA 10 MG tablet TAKE 1 TABLET BY MOUTH EVERY DAY FOR CHOLESTEROL 30 tablet 10   No current facility-administered medications on file prior to visit.   Past Medical History  Diagnosis Date  . Hypertension   . Hyperlipidemia   . Bradycardia   . Myocardial infarction     2004  . Tobacco abuse 03/28/2012  . 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 CAD (coronary artery disease)     remote MI in 2004 with PCI; s/p CABG x 3 03/2012  . Adenomatous colon polyp 02/15/2009  . Family history of malignant neoplasm of gastrointestinal tract   . Prediabetes   . GERD (gastroesophageal reflux disease)   . Vitamin D deficiency   . Obesity     Review of Systems  Constitutional: Negative for fever, chills and diaphoresis.  HENT: Positive for congestion and sinus pressure. Negative for ear pain, postnasal drip, rhinorrhea, sneezing, sore throat, trouble swallowing and voice change.   Eyes: Negative.   Respiratory: Positive for cough and wheezing. Negative for chest tightness and shortness of breath.  Cardiovascular: Negative.   Gastrointestinal: Positive for abdominal pain. Negative for nausea, vomiting, diarrhea, constipation, blood in stool, abdominal distention, anal bleeding and rectal pain.  Genitourinary: Negative.   Musculoskeletal: Negative.  Negative for neck pain.  Neurological: Negative.  Negative for headaches.       Objective:   Physical Exam  Constitutional: He is oriented to person, place, and time. He appears well-developed and well-nourished.  HENT:  Head: Normocephalic and atraumatic.  Right Ear: External ear normal.  Left Ear: External ear normal.  Nose: Nose normal.  Mouth/Throat: Oropharynx is clear and moist.  Eyes: Conjunctivae are normal. Pupils are equal, round, and reactive to light.  Neck: Normal range of motion. Neck supple.  Cardiovascular: Normal rate, regular rhythm  and normal heart sounds.   No murmur heard. Pulmonary/Chest: Effort normal. No respiratory distress. He has wheezes. He has no rales. He exhibits no tenderness.  Abdominal: Soft. Bowel sounds are normal.  Lymphadenopathy:    He has no cervical adenopathy.  Neurological: He is alert and oriented to person, place, and time.  Skin: Skin is warm and dry.       Assessment & Plan:  1. Acute bronchitis, unspecified organism [J20.9] Will hold the zpak and take if she is not getting better, increase fluids, rest, cont allergy pill, if not better will need CXR, due in august  - azithromycin (ZITHROMAX) 250 MG tablet; Take 2 tablets (500 mg) on  Day 1,  followed by 1 tablet (250 mg) once daily on Days 2 through 5.  Dispense: 6 each; Refill: 1 - predniSONE (DELTASONE) 20 MG tablet; 2 tablets daily for 3 days, 1 tablet daily for 4 days.  Dispense: 10 tablet; Refill: 0 - benzonatate (TESSALON PERLES) 100 MG capsule; Take 2 capsules (200 mg total) by mouth 3 (three) times daily as needed for cough (Max: 600mg  per day (6 capsules per day)).  Dispense: 90 capsule; Refill: 0 - promethazine-codeine (PHENERGAN WITH CODEINE) 6.25-10 MG/5ML syrup; Take 5 ml before bed PRN for cough. Max: 22mL per day  Dispense: 240 mL; Refill: 0  2. Smoking cessation  instruction/counseling given, counseled patient on the dangers of tobacco use, advised patient to stop smoking, and reviewed strategies to maximize success, patient not ready to quit at this time.   3. Myalgias/knee pain Check electrolytes, check CPK, stop lipitor x 1 week, uric acid.   Future Appointments Date Time Provider [place]  09/23/2015 3:30 PM Starlyn Skeans, PA-C [facility] None  01/03/2016 3:30 PM Unk Pinto, MD [facility] None  07/10/2016 3:00 PM Unk Pinto, MD [facility] None

## 2015-07-14 LAB — VITAMIN D 25 HYDROXY (VIT D DEFICIENCY, FRACTURES): Vit D, 25-Hydroxy: 75 ng/mL (ref 30–100)

## 2015-07-14 LAB — FERRITIN: Ferritin: 300 ng/mL (ref 22–322)

## 2015-07-14 LAB — VITAMIN B12: Vitamin B-12: >2000 pg/mL — ABNORMAL HIGH (ref 211–911)

## 2015-07-16 ENCOUNTER — Encounter: Payer: Self-pay | Admitting: Physician Assistant

## 2015-07-21 ENCOUNTER — Other Ambulatory Visit: Payer: Self-pay | Admitting: Internal Medicine

## 2015-07-28 ENCOUNTER — Other Ambulatory Visit: Payer: Self-pay | Admitting: Internal Medicine

## 2015-08-29 ENCOUNTER — Other Ambulatory Visit: Payer: Self-pay | Admitting: Internal Medicine

## 2015-09-23 ENCOUNTER — Ambulatory Visit (INDEPENDENT_AMBULATORY_CARE_PROVIDER_SITE_OTHER): Payer: No Typology Code available for payment source | Admitting: Internal Medicine

## 2015-09-23 ENCOUNTER — Encounter: Payer: Self-pay | Admitting: Internal Medicine

## 2015-09-23 VITALS — BP 128/64 | HR 58 | Temp 98.2°F | Resp 18 | Ht 74.25 in | Wt 279.0 lb

## 2015-09-23 DIAGNOSIS — I10 Essential (primary) hypertension: Secondary | ICD-10-CM

## 2015-09-23 DIAGNOSIS — Z79899 Other long term (current) drug therapy: Secondary | ICD-10-CM

## 2015-09-23 DIAGNOSIS — E1129 Type 2 diabetes mellitus with other diabetic kidney complication: Secondary | ICD-10-CM | POA: Diagnosis not present

## 2015-09-23 DIAGNOSIS — E559 Vitamin D deficiency, unspecified: Secondary | ICD-10-CM

## 2015-09-23 DIAGNOSIS — E785 Hyperlipidemia, unspecified: Secondary | ICD-10-CM | POA: Diagnosis not present

## 2015-09-23 LAB — CBC WITH DIFFERENTIAL/PLATELET
Basophils Absolute: 0 10*3/uL (ref 0.0–0.1)
Basophils Relative: 1 % (ref 0–1)
Eosinophils Absolute: 0.1 10*3/uL (ref 0.0–0.7)
Eosinophils Relative: 3 % (ref 0–5)
HCT: 43.8 % (ref 39.0–52.0)
Hemoglobin: 14.8 g/dL (ref 13.0–17.0)
Lymphocytes Relative: 24 % (ref 12–46)
Lymphs Abs: 1.1 10*3/uL (ref 0.7–4.0)
MCH: 28.9 pg (ref 26.0–34.0)
MCHC: 33.8 g/dL (ref 30.0–36.0)
MCV: 85.5 fL (ref 78.0–100.0)
MPV: 11.2 fL (ref 8.6–12.4)
Monocytes Absolute: 0.5 10*3/uL (ref 0.1–1.0)
Monocytes Relative: 10 % (ref 3–12)
Neutro Abs: 2.9 10*3/uL (ref 1.7–7.7)
Neutrophils Relative %: 62 % (ref 43–77)
Platelets: 250 10*3/uL (ref 150–400)
RBC: 5.12 MIL/uL (ref 4.22–5.81)
RDW: 14.5 % (ref 11.5–15.5)
WBC: 4.6 10*3/uL (ref 4.0–10.5)

## 2015-09-23 MED ORDER — VITAMIN D (ERGOCALCIFEROL) 1.25 MG (50000 UNIT) PO CAPS: 50000.0000 [IU] | ORAL_CAPSULE | ORAL | Status: AC

## 2015-09-23 NOTE — Progress Notes (Signed)
Patient ID: Curtis Perez, male   DOB: [birth year], 64 y.o.   MRN: [id number]  Assessment and Plan:  Hypertension:  -Continue medication -monitor blood pressure at home. -Continue DASH diet -Reminder to go to the ER if any CP, SOB, nausea, dizziness, severe HA, changes vision/speech, left arm numbness and tingling and jaw pain.  Cholesterol - Continue diet and exercise -Check cholesterol.   Diabetes without complications -Continue diet and exercise.  -Check A1C  Vitamin D Def -check level -continue medications.   Continue diet and meds as discussed. Further disposition pending results of labs. Discussed med's effects and SE's.    HPI 64 y.o. male  presents for 3 month follow up with hypertension, hyperlipidemia, diabetes and vitamin D deficiency.   His blood pressure has been controlled at home, today their BP is BP: 128/64 mmHg.He does workout. He denies chest pain, shortness of breath, dizziness.   He is on cholesterol medication and denies myalgias. His cholesterol is not at goal. The cholesterol was:  06/17/2015: Cholesterol 103; HDL 51; LDL Cholesterol 35; Triglycerides 85   He has not been working on diet and exercise for diabetes with diabetic chronic kidney disease, he is on bASA, he is on ACE/ARB, and denies  foot ulcerations, hyperglycemia, hypoglycemia , increased appetite, nausea, paresthesia of the feet, polydipsia, polyuria, visual disturbances, vomiting and weight loss. Last A1C was: 06/17/2015: Hgb A1c MFr Bld 6.9*.  He reports that he is up 9 lbs since his last visit. He reports that he knows he needs to lose weight and knows how to do it.     Patient is on Vitamin D supplement. 07/13/2015: Vit D, 25-Hydroxy 75    Current Medications:  Current Outpatient Prescriptions on File Prior to Visit  Medication Sig Dispense Refill  . atenolol (TENORMIN) 50 MG tablet TAKE 1 TABLET BY MOUTH DAILY 30 tablet 3  . atorvastatin (LIPITOR) 80 MG tablet TAKE 1 TABLET (80 MG TOTAL)  BY MOUTH DAILY. FOR CHOLESTEROL 30 tablet 5  . Cyanocobalamin (VITAMIN B-12 PO) Take 1,000 mcg by mouth daily.     Marland Kitchen levothyroxine (SYNTHROID) 100 MCG tablet Take 1 tablet (100 mcg total) by mouth daily before breakfast. 90 tablet 1  . lisinopril (PRINIVIL,ZESTRIL) 10 MG tablet TAKE 1 TABLET BY MOUTH EVERY DAY 90 tablet 1  . losartan (COZAAR) 100 MG tablet Take 1/2 to 1 tablet daily for BP 30 tablet 5  . Magnesium 400 MG CAPS Take 400 mg by mouth. Takes 4 per day    . metFORMIN (GLUCOPHAGE-XR) 500 MG 24 hr tablet TAKE 1 TABLET WITH BREAKFAST AND LUNCH & 2 TABLETS AT SUPPER FOR DIABETES 360 tablet 1  . Omega-3 Fatty Acids (FISH OIL PO) Take 2,000 mg by mouth daily.    Marland Kitchen omeprazole (PRILOSEC) 40 MG capsule TAKE ONE CAPSULE BY MOUTH TWICE A DAY 180 capsule 1  . Vitamin D, Ergocalciferol, (DRISDOL) 50000 UNITS CAPS capsule Take 1 capsule (50,000 Units total) by mouth 3 (three) times a week. 30 capsule 3  . ZETIA 10 MG tablet TAKE 1 TABLET BY MOUTH EVERY DAY FOR CHOLESTEROL 90 tablet 1   No current facility-administered medications on file prior to visit.   Medical History:  Past Medical History  Diagnosis Date  . Hypertension   . Hyperlipidemia   . Bradycardia   . Myocardial infarction ([place])     2004  . Tobacco abuse 03/28/2012  . 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    LIMA to LAD, SVG to OM1,  SVG to LPDA, EVH via right thigh and leg  . CAD (coronary artery disease)     remote MI in 2004 with PCI; s/p CABG x 3 03/2012  . Adenomatous colon polyp 02/15/2009  . Family history of malignant neoplasm of gastrointestinal tract   . Prediabetes   . GERD (gastroesophageal reflux disease)   . Vitamin D deficiency   . Obesity    Allergies:  Allergies  Allergen Reactions  . Lisinopril Cough     Review of Systems:  Review of Systems  Constitutional: Negative for fever and chills.  HENT: Negative for congestion, ear discharge and sore throat.   Eyes: Negative.   Respiratory: Negative for cough, shortness of  breath and wheezing.   Cardiovascular: Positive for chest pain. Negative for palpitations and leg swelling.  Gastrointestinal: Negative for heartburn, diarrhea, constipation, blood in stool and melena.  Genitourinary: Negative.   Neurological: Negative for dizziness, sensory change, loss of consciousness and headaches.  Psychiatric/Behavioral: Negative for depression. The patient is not nervous/anxious and does not have insomnia.     Family history- Review and unchanged  Social history- Review and unchanged  Physical Exam: BP 128/64 mmHg  Pulse 58  Temp(Src) 98.2 F (36.8 C) (Temporal)  Resp 18  Ht 6' 2.25" (1.886 m)  Wt 279 lb (126.554 kg)  BMI 35.58 kg/m2 Wt Readings from Last 3 Encounters:  09/23/15 279 lb (126.554 kg)  07/13/15 271 lb (122.925 kg)  06/17/15 275 lb 3.2 oz (124.83 kg)   General Appearance: Well nourished well developed, non-toxic appearing, in no apparent distress. Eyes: PERRLA, EOMs, conjunctiva no swelling or erythema ENT/Mouth: Ear canals clear with no erythema, swelling, or discharge.  TMs normal bilaterally, oropharynx clear, moist, with no exudate.   Neck: Supple, thyroid normal, no JVD, no cervical adenopathy.  Respiratory: Respiratory effort normal, breath sounds clear A&P, no wheeze, rhonchi or rales noted.  No retractions, no accessory muscle usage Cardio: RRR with no MRGs. No noted edema.  Abdomen: Soft, + BS.  Non tender, no guarding, rebound, hernias, masses. Musculoskeletal: Full ROM, 5/5 strength, Normal gait Skin: Warm, dry without rashes, lesions, ecchymosis.  Neuro: Awake and oriented X 3, Cranial nerves intact. No cerebellar symptoms.  Psych: normal affect, Insight and Judgment appropriate.    Starlyn Skeans, PA-C 4:11 PM [facility]

## 2015-09-24 ENCOUNTER — Other Ambulatory Visit: Payer: Self-pay | Admitting: Internal Medicine

## 2015-09-24 LAB — HEPATIC FUNCTION PANEL
ALT: 9 U/L (ref 9–46)
AST: 12 U/L (ref 10–35)
Albumin: 4 g/dL (ref 3.6–5.1)
Alkaline Phosphatase: 81 U/L (ref 40–115)
Bilirubin, Direct: 0.2 mg/dL (ref <=0.2)
Indirect Bilirubin: 0.7 mg/dL (ref 0.2–1.2)
Total Bilirubin: 0.9 mg/dL (ref 0.2–1.2)
Total Protein: 6.4 g/dL (ref 6.1–8.1)

## 2015-09-24 LAB — LIPID PANEL
Cholesterol: 194 mg/dL (ref 125–200)
HDL: 47 mg/dL (ref >=40)
LDL Cholesterol: 111 mg/dL (ref <130)
Total CHOL/HDL Ratio: 4.1 Ratio (ref <=5.0)
Triglycerides: 180 mg/dL — ABNORMAL HIGH (ref <150)
VLDL: 36 mg/dL — ABNORMAL HIGH (ref <30)

## 2015-09-24 LAB — BASIC METABOLIC PANEL WITH GFR
BUN: 10 mg/dL (ref 7–25)
CO2: 26 mmol/L (ref 20–31)
Calcium: 9.3 mg/dL (ref 8.6–10.3)
Chloride: 99 mmol/L (ref 98–110)
Creat: 0.95 mg/dL (ref 0.70–1.25)
GFR, Est African American: >89 mL/min (ref >=60)
GFR, Est Non African American: 86 mL/min (ref >=60)
Glucose, Bld: 97 mg/dL (ref 65–99)
Potassium: 5.2 mmol/L (ref 3.5–5.3)
Sodium: 135 mmol/L (ref 135–146)

## 2015-09-24 LAB — TSH: TSH: 1.529 u[IU]/mL (ref 0.350–4.500)

## 2015-09-24 LAB — MAGNESIUM: Magnesium: 2.1 mg/dL (ref 1.5–2.5)

## 2015-09-24 LAB — HEMOGLOBIN A1C
Hgb A1c MFr Bld: 6.5 % — ABNORMAL HIGH (ref <5.7)
Mean Plasma Glucose: 140 mg/dL — ABNORMAL HIGH (ref <117)

## 2015-09-24 LAB — VITAMIN D 25 HYDROXY (VIT D DEFICIENCY, FRACTURES): Vit D, 25-Hydroxy: 66 ng/mL (ref 30–100)

## 2015-09-24 LAB — INSULIN, RANDOM: Insulin: 6.6 u[IU]/mL (ref 2.0–19.6)

## 2015-12-27 ENCOUNTER — Other Ambulatory Visit: Payer: Self-pay | Admitting: Internal Medicine

## 2016-01-03 ENCOUNTER — Encounter: Payer: Self-pay | Admitting: Internal Medicine

## 2016-01-03 ENCOUNTER — Ambulatory Visit (INDEPENDENT_AMBULATORY_CARE_PROVIDER_SITE_OTHER): Payer: 59 | Admitting: Internal Medicine

## 2016-01-03 VITALS — BP 150/90 | HR 64 | Temp 97.3°F | Resp 16 | Ht 74.25 in | Wt 285.7 lb

## 2016-01-03 DIAGNOSIS — K219 Gastro-esophageal reflux disease without esophagitis: Secondary | ICD-10-CM | POA: Diagnosis not present

## 2016-01-03 DIAGNOSIS — E1122 Type 2 diabetes mellitus with diabetic chronic kidney disease: Secondary | ICD-10-CM

## 2016-01-03 DIAGNOSIS — Z23 Encounter for immunization: Secondary | ICD-10-CM | POA: Diagnosis not present

## 2016-01-03 DIAGNOSIS — N182 Chronic kidney disease, stage 2 (mild): Secondary | ICD-10-CM | POA: Diagnosis not present

## 2016-01-03 DIAGNOSIS — I10 Essential (primary) hypertension: Secondary | ICD-10-CM | POA: Diagnosis not present

## 2016-01-03 DIAGNOSIS — E785 Hyperlipidemia, unspecified: Secondary | ICD-10-CM

## 2016-01-03 DIAGNOSIS — E559 Vitamin D deficiency, unspecified: Secondary | ICD-10-CM

## 2016-01-03 DIAGNOSIS — Z79899 Other long term (current) drug therapy: Secondary | ICD-10-CM

## 2016-01-03 NOTE — Patient Instructions (Signed)

## 2016-01-03 NOTE — Progress Notes (Signed)
Patient ID: Curtis Perez, male   DOB: 1951-08-27, 65 y.o.   MRN: [id number]   This very nice 65 y.o. Single WM presents for  follow up with Hypertension, Hyperlipidemia, T2_NIDDM / CKD2, Hypothyroidism   and Vitamin D Deficiency. GERD sx's are controlled with his meds.    Patient is treated for HTN Since 2005  & BP has been controlled at home. Today's BP is 150/90 - re-ck'd at 142/86. In 2005 patient had a PTCA & Stent and did well until 2013 when he underwent CABG. Patient has had no complaints of any cardiac type chest pain, palpitations, dyspnea/orthopnea/PND, dizziness, claudication, or dependent edema.   Hyperlipidemia is notcontrolled with diet & meds due to non-compliant diet. Patient denies myalgias or other med SE's. Last Lipids were not at goal with Cholesterol 194; HDL 47; LDL 111; Triglycerides 180 on 09/23/2015.   Also, the patient has history of Morbid Obesity (BMI 36+) and consequentT2_NIDDM since 2011 with A1c 6.2% and 6.6% in 2014 and has had no symptoms of reactive hypoglycemia, diabetic polys, paresthesias or visual blurring.  Last A1c was  6.5% on 09/23/2015.   Patient has been on thyroid replacement since 2015.  Further, the patient also has history of Vitamin D Deficiency of "6" in 2008  and supplements vitamin D without any suspected side-effects. Last vitamin D was 66 on 09/23/2015.  Medication Sig  . atenolol  50 MG  TAKE 1 TABLET BY MOUTH DAILY  . atorvastatin  80 MG TAKE 1 TABLET (80 MG TOTAL) BY MOUTH DAILY. FOR CHOLESTEROL  . VITAMIN B-12 Take 1,000 mcg by mouth daily.   Marland Kitchen levothyroxine  100 MCG  Take 1 tablet (100 mcg total) by mouth daily before breakfast.  . lisinopril  10 MG  TAKE 1 TABLET BY MOUTH EVERY DAY  . Magnesium 400 MG  Take 400 mg by mouth. Takes 4 per day  . metFORMIN-XR 500 MG  TAKE 1 TABLET WITH BREAKFAST AND LUNCH & 2 TABLETS AT SUPPER FOR DIABETES  . Omega-3 FISH OIL Take 2,000 mg by mouth daily.  Marland Kitchen omeprazole  40 MG  TAKE ONE CAPSULE BY MOUTH TWICE A  DAY  . Potassium 75 MG  Take by mouth daily.  . Vitamin D 50,000 UNITS  Take 1 capsule (50,000 Units total) by mouth 3 (three) times a week.  Marland Kitchen ZETIA 10 MG  TAKE 1 TABLET BY MOUTH EVERY DAY FOR CHOLESTEROL  . losartan  100 MG Take 1/2 to 1 tablet daily for BP   Allergies  Allergen Reactions  . Lisinopril Cough   PMHx:   Past Medical History  Diagnosis Date  . Hypertension   . Hyperlipidemia   . Bradycardia   . Myocardial infarction ([place])     2004  . Tobacco abuse 03/28/2012  . 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 CAD (coronary artery disease)     remote MI in 2004 with PCI; s/p CABG x 3 03/2012  . Adenomatous colon polyp 02/15/2009  . Family history of malignant neoplasm of gastrointestinal tract   . Prediabetes   . GERD (gastroesophageal reflux disease)   . Vitamin D deficiency   . Obesity    Immunization History  Administered Date(s) Administered  . PPD Test 06/15/2014  . Pneumococcal Polysaccharide-23 01/05/2009, 08/10/2014  . Td 01/05/2009   Past Surgical History  Procedure Laterality Date  . Tonsillectomy    .  Knee surgery    . Back surgery    . Coronary angioplasty with stent placement      about 8 years ago at [facility]  . Colonoscopy  02/15/2009  . Coronary artery bypass graft  04/02/2012    Procedure: CORONARY ARTERY BYPASS GRAFTING (CABG);  Surgeon: Rexene Alberts, MD;  Location: [place];  Service: Open Heart Surgery;  Laterality: N/A;  On pump, times three graphs using endoscopically harvested right greater saphenous vein and left internal mammary artery.    FHx:    Reviewed / unchanged  SHx:    Reviewed / unchanged  Systems Review:  Constitutional: Denies fever, chills, wt changes, headaches, insomnia, fatigue, night sweats, change in appetite. Eyes: Denies redness, blurred vision, diplopia, discharge, itchy, watery eyes.  ENT: Denies discharge, congestion, post nasal drip, epistaxis, sore  throat, earache, hearing loss, dental pain, tinnitus, vertigo, sinus pain, snoring.  CV: Denies chest pain, palpitations, irregular heartbeat, syncope, dyspnea, diaphoresis, orthopnea, PND, claudication or edema. Respiratory: denies cough, dyspnea, DOE, pleurisy, hoarseness, laryngitis, wheezing.  Gastrointestinal: Denies dysphagia, odynophagia, heartburn, reflux, water brash, abdominal pain or cramps, nausea, vomiting, bloating, diarrhea, constipation, hematemesis, melena, hematochezia  or hemorrhoids. Genitourinary: Denies dysuria, frequency, urgency, nocturia, hesitancy, discharge, hematuria or flank pain. Musculoskeletal: Denies arthralgias, myalgias, stiffness, jt. swelling, pain, limping or strain/sprain.  Skin: Denies pruritus, rash, hives, warts, acne, eczema or change in skin lesion(s). Neuro: No weakness, tremor, incoordination, spasms, paresthesia or pain. Psychiatric: Denies confusion, memory loss or sensory loss. Endo: Denies change in weight, skin or hair change.  Heme/Lymph: No excessive bleeding, bruising or enlarged lymph nodes.  Physical Exam  BP 150/90 mmHg  Pulse 64  Temp(Src) 97.3 F (36.3 C)  Resp 16  Ht 6' 2.25" (1.886 m)  Wt 285 lb 11.2 oz (129.593 kg)  BMI 36.43 kg/m2  Appears well nourished and in no distress. Eyes: PERRLA, EOMs, conjunctiva no swelling or erythema. Sinuses: No frontal/maxillary tenderness ENT/Mouth: EAC's clear, TM's nl w/o erythema, bulging. Nares clear w/o erythema, swelling, exudates. Oropharynx clear without erythema or exudates. Oral hygiene is good. Tongue normal, non obstructing. Hearing intact.  Neck: Supple. Thyroid nl. Car 2+/2+ without bruits, nodes or JVD. Chest: Respirations nl with BS clear & equal w/o rales, rhonchi, wheezing or stridor.  Cor: Heart sounds normal w/ regular rate and rhythm without sig. murmurs, gallops, clicks, or rubs. Peripheral pulses normal and equal  without edema.  Abdomen: Soft & bowel sounds normal.  Non-tender w/o guarding, rebound, hernias, masses, or organomegaly.  Lymphatics: Unremarkable.  Musculoskeletal: Full ROM all peripheral extremities, joint stability, 5/5 strength, and normal gait.  Skin: Warm, dry without exposed rashes, lesions or ecchymosis apparent.  Neuro: Cranial nerves intact, reflexes equal bilaterally. Sensory-motor testing grossly intact. Tendon reflexes grossly intact.  Pysch: Alert & oriented x 3.  Insight and judgement nl & appropriate. No ideations.  Assessment and Plan:  1. Essential hypertension  - TSH  2. Hyperlipidemia  - Lipid panel - TSH  3. Type 2 diabetes mellitus with stage 2 chronic kidney disease, without long-term current use of insulin (HCC)  - Hemoglobin A1c - Insulin, random  4. Vitamin D deficiency  - VITAMIN D 25 Hydroxy   5. Gastroesophageal reflux disease   6. Medication management  - CBC with Differential/Platelet - BASIC METABOLIC PANEL WITH GFR - Hepatic function panel - Magnesium   Recommended regular exercise, BP monitoring, weight control, and discussed med and SE's. Recommended labs to assess and monitor clinical status. Further disposition  pending results of labs. Over 30 minutes of exam, counseling, chart review was performed

## 2016-01-04 LAB — CBC WITH DIFFERENTIAL/PLATELET
Basophils Absolute: 0 10*3/uL (ref 0.0–0.1)
Basophils Relative: 1 % (ref 0–1)
Eosinophils Absolute: 0.1 10*3/uL (ref 0.0–0.7)
Eosinophils Relative: 3 % (ref 0–5)
HCT: 43.3 % (ref 39.0–52.0)
Hemoglobin: 14.7 g/dL (ref 13.0–17.0)
Lymphocytes Relative: 27 % (ref 12–46)
Lymphs Abs: 1.2 10*3/uL (ref 0.7–4.0)
MCH: 29 pg (ref 26.0–34.0)
MCHC: 33.9 g/dL (ref 30.0–36.0)
MCV: 85.4 fL (ref 78.0–100.0)
MPV: 11.4 fL (ref 8.6–12.4)
Monocytes Absolute: 0.4 10*3/uL (ref 0.1–1.0)
Monocytes Relative: 8 % (ref 3–12)
Neutro Abs: 2.7 10*3/uL (ref 1.7–7.7)
Neutrophils Relative %: 61 % (ref 43–77)
Platelets: 210 10*3/uL (ref 150–400)
RBC: 5.07 MIL/uL (ref 4.22–5.81)
RDW: 14.3 % (ref 11.5–15.5)
WBC: 4.5 10*3/uL (ref 4.0–10.5)

## 2016-01-04 LAB — BASIC METABOLIC PANEL WITH GFR
BUN: 8 mg/dL (ref 7–25)
CO2: 28 mmol/L (ref 20–31)
Calcium: 9 mg/dL (ref 8.6–10.3)
Chloride: 100 mmol/L (ref 98–110)
Creat: 1.06 mg/dL (ref 0.70–1.25)
GFR, Est African American: 87 mL/min (ref >=60)
GFR, Est Non African American: 75 mL/min (ref >=60)
Glucose, Bld: 94 mg/dL (ref 65–99)
Potassium: 4.4 mmol/L (ref 3.5–5.3)
Sodium: 134 mmol/L — ABNORMAL LOW (ref 135–146)

## 2016-01-04 LAB — HEPATIC FUNCTION PANEL
ALT: 9 U/L (ref 9–46)
AST: 12 U/L (ref 10–35)
Albumin: 3.8 g/dL (ref 3.6–5.1)
Alkaline Phosphatase: 87 U/L (ref 40–115)
Bilirubin, Direct: 0.1 mg/dL (ref <=0.2)
Indirect Bilirubin: 0.5 mg/dL (ref 0.2–1.2)
Total Bilirubin: 0.6 mg/dL (ref 0.2–1.2)
Total Protein: 5.8 g/dL — ABNORMAL LOW (ref 6.1–8.1)

## 2016-01-04 LAB — MAGNESIUM: Magnesium: 1.9 mg/dL (ref 1.5–2.5)

## 2016-01-04 LAB — LIPID PANEL
Cholesterol: 196 mg/dL (ref 125–200)
HDL: 46 mg/dL (ref >=40)
LDL Cholesterol: 122 mg/dL (ref <130)
Total CHOL/HDL Ratio: 4.3 Ratio (ref <=5.0)
Triglycerides: 141 mg/dL (ref <150)
VLDL: 28 mg/dL (ref <30)

## 2016-01-04 LAB — HEMOGLOBIN A1C
Hgb A1c MFr Bld: 6.7 % — ABNORMAL HIGH (ref <5.7)
Mean Plasma Glucose: 146 mg/dL — ABNORMAL HIGH (ref <117)

## 2016-01-04 LAB — VITAMIN D 25 HYDROXY (VIT D DEFICIENCY, FRACTURES): Vit D, 25-Hydroxy: 54 ng/mL (ref 30–100)

## 2016-01-04 LAB — INSULIN, RANDOM: Insulin: 6.3 u[IU]/mL (ref 2.0–19.6)

## 2016-01-04 LAB — TSH: TSH: 1.702 u[IU]/mL (ref 0.350–4.500)

## 2016-01-10 ENCOUNTER — Other Ambulatory Visit: Payer: Self-pay | Admitting: Internal Medicine

## 2016-02-23 ENCOUNTER — Encounter: Payer: Self-pay | Admitting: Internal Medicine

## 2016-02-25 ENCOUNTER — Other Ambulatory Visit: Payer: Self-pay | Admitting: Physician Assistant

## 2016-03-23 ENCOUNTER — Other Ambulatory Visit: Payer: Self-pay | Admitting: Internal Medicine

## 2016-03-26 ENCOUNTER — Other Ambulatory Visit: Payer: Self-pay | Admitting: Internal Medicine

## 2016-04-03 ENCOUNTER — Ambulatory Visit: Payer: Self-pay | Admitting: Physician Assistant

## 2016-05-02 ENCOUNTER — Other Ambulatory Visit: Payer: Self-pay | Admitting: Internal Medicine

## 2016-06-10 ENCOUNTER — Other Ambulatory Visit: Payer: Self-pay | Admitting: Internal Medicine

## 2016-07-10 ENCOUNTER — Ambulatory Visit: Payer: 59 | Admitting: Internal Medicine

## 2016-07-10 ENCOUNTER — Encounter: Payer: Self-pay | Admitting: Internal Medicine

## 2016-07-10 VITALS — BP 122/80 | HR 60 | Temp 97.7°F | Ht 74.25 in | Wt 277.2 lb

## 2016-07-10 DIAGNOSIS — Z951 Presence of aortocoronary bypass graft: Secondary | ICD-10-CM

## 2016-07-10 DIAGNOSIS — E559 Vitamin D deficiency, unspecified: Secondary | ICD-10-CM

## 2016-07-10 DIAGNOSIS — Z125 Encounter for screening for malignant neoplasm of prostate: Secondary | ICD-10-CM

## 2016-07-10 DIAGNOSIS — Z0001 Encounter for general adult medical examination with abnormal findings: Secondary | ICD-10-CM

## 2016-07-10 DIAGNOSIS — I10 Essential (primary) hypertension: Secondary | ICD-10-CM

## 2016-07-10 DIAGNOSIS — E669 Obesity, unspecified: Secondary | ICD-10-CM

## 2016-07-10 DIAGNOSIS — Z79899 Other long term (current) drug therapy: Secondary | ICD-10-CM

## 2016-07-10 DIAGNOSIS — Z111 Encounter for screening for respiratory tuberculosis: Secondary | ICD-10-CM

## 2016-07-10 DIAGNOSIS — N182 Chronic kidney disease, stage 2 (mild): Secondary | ICD-10-CM

## 2016-07-10 DIAGNOSIS — K219 Gastro-esophageal reflux disease without esophagitis: Secondary | ICD-10-CM

## 2016-07-10 DIAGNOSIS — F32A Depression, unspecified: Secondary | ICD-10-CM

## 2016-07-10 DIAGNOSIS — Z136 Encounter for screening for cardiovascular disorders: Secondary | ICD-10-CM

## 2016-07-10 DIAGNOSIS — K635 Polyp of colon: Secondary | ICD-10-CM

## 2016-07-10 DIAGNOSIS — E785 Hyperlipidemia, unspecified: Secondary | ICD-10-CM

## 2016-07-10 DIAGNOSIS — F329 Major depressive disorder, single episode, unspecified: Secondary | ICD-10-CM

## 2016-07-10 DIAGNOSIS — E1122 Type 2 diabetes mellitus with diabetic chronic kidney disease: Secondary | ICD-10-CM

## 2016-07-10 DIAGNOSIS — Z1212 Encounter for screening for malignant neoplasm of rectum: Secondary | ICD-10-CM

## 2016-07-10 DIAGNOSIS — R5383 Other fatigue: Secondary | ICD-10-CM

## 2016-07-10 LAB — CBC WITH DIFFERENTIAL/PLATELET
Basophils Absolute: 46 cells/uL (ref 0–200)
Basophils Relative: 1 %
Eosinophils Absolute: 138 cells/uL (ref 15–500)
Eosinophils Relative: 3 %
HCT: 43.8 % (ref 38.5–50.0)
Hemoglobin: 15.5 g/dL (ref 13.2–17.1)
Lymphocytes Relative: 25 %
Lymphs Abs: 1150 cells/uL (ref 850–3900)
MCH: 29.9 pg (ref 27.0–33.0)
MCHC: 35.4 g/dL (ref 32.0–36.0)
MCV: 84.6 fL (ref 80.0–100.0)
MPV: 10.9 fL (ref 7.5–12.5)
Monocytes Absolute: 322 cells/uL (ref 200–950)
Monocytes Relative: 7 %
Neutro Abs: 2944 cells/uL (ref 1500–7800)
Neutrophils Relative %: 64 %
Platelets: 243 10*3/uL (ref 140–400)
RBC: 5.18 MIL/uL (ref 4.20–5.80)
RDW: 14.1 % (ref 11.0–15.0)
WBC: 4.6 10*3/uL (ref 3.8–10.8)

## 2016-07-10 LAB — BASIC METABOLIC PANEL WITH GFR
BUN: 7 mg/dL (ref 7–25)
CO2: 24 mmol/L (ref 20–31)
Calcium: 9.2 mg/dL (ref 8.6–10.3)
Chloride: 100 mmol/L (ref 98–110)
Creat: 1.06 mg/dL (ref 0.70–1.25)
GFR, Est African American: 87 mL/min (ref >=60)
GFR, Est Non African American: 75 mL/min (ref >=60)
Glucose, Bld: 117 mg/dL — ABNORMAL HIGH (ref 65–99)
Potassium: 4.5 mmol/L (ref 3.5–5.3)
Sodium: 134 mmol/L — ABNORMAL LOW (ref 135–146)

## 2016-07-10 LAB — HEPATIC FUNCTION PANEL
ALT: 8 U/L — ABNORMAL LOW (ref 9–46)
AST: 11 U/L (ref 10–35)
Albumin: 4.1 g/dL (ref 3.6–5.1)
Alkaline Phosphatase: 91 U/L (ref 40–115)
Bilirubin, Direct: 0.1 mg/dL (ref <=0.2)
Indirect Bilirubin: 0.6 mg/dL (ref 0.2–1.2)
Total Bilirubin: 0.7 mg/dL (ref 0.2–1.2)
Total Protein: 6.1 g/dL (ref 6.1–8.1)

## 2016-07-10 LAB — TSH: TSH: 1.26 mIU/L (ref 0.40–4.50)

## 2016-07-10 LAB — LIPID PANEL
Cholesterol: 207 mg/dL — ABNORMAL HIGH (ref 125–200)
HDL: 49 mg/dL (ref >=40)
LDL Cholesterol: 120 mg/dL (ref <130)
Total CHOL/HDL Ratio: 4.2 Ratio (ref <=5.0)
Triglycerides: 189 mg/dL — ABNORMAL HIGH (ref <150)
VLDL: 38 mg/dL — ABNORMAL HIGH (ref <30)

## 2016-07-10 LAB — VITAMIN B12: Vitamin B-12: 1705 pg/mL — ABNORMAL HIGH (ref 200–1100)

## 2016-07-10 LAB — IRON AND TIBC
%SAT: 19 % (ref 15–60)
Iron: 63 ug/dL (ref 50–180)
TIBC: 335 ug/dL (ref 250–425)
UIBC: 272 ug/dL (ref 125–400)

## 2016-07-10 LAB — MAGNESIUM: Magnesium: 1.8 mg/dL (ref 1.5–2.5)

## 2016-07-10 LAB — HEMOGLOBIN A1C
Hgb A1c MFr Bld: 6.6 % — ABNORMAL HIGH (ref <5.7)
Mean Plasma Glucose: 143 mg/dL

## 2016-07-10 LAB — TESTOSTERONE: Testosterone: 788 ng/dL (ref 250–827)

## 2016-07-10 MED ORDER — EZETIMIBE 10 MG PO TABS: ORAL_TABLET | ORAL | 1 refills | Status: DC

## 2016-07-10 MED ORDER — SERTRALINE HCL 50 MG PO TABS: ORAL_TABLET | ORAL | 1 refills | Status: DC

## 2016-07-10 NOTE — Progress Notes (Addendum)
[facility]   Unk Pinto, M.D.    Uvaldo Bristle. Silverio Lay, P.A.-C      Starlyn Skeans, P.A.-C   [facility]                [address] [place] [id number] Telephone [phone] Telefax [phone]  Annual  Screening/Preventative Visit And Comprehensive Evaluation & Examination     This very nice 65 y.o. SWM presents for a Wellness/Preventative Visit & comprehensive evaluation and management of multiple medical co-morbidities.  Patient has been followed for HTN, T2_NIDDM w/CKD2, Hyperlipidemia and Vitamin D Deficiency. Patient does report a tendency to depressed mood and agrees to trial of medication and denies any SI.     HTN predates circa 2005 when he presented with ACS and underwent PTCA/stent and ultimately in 2013 underwent CABG.   Patient's BP has been controlled at home.Today's BP: 122/80. Patient denies any cardiac symptoms as chest pain, palpitations, shortness of breath, dizziness or ankle swelling.     Patient's hyperlipidemia is not controlled with diet and medications. Patient statin compliance is in question denies myalgias or other medication SE's. Last lipids were not at goal: Lab Results  Component Value Date   CHOL 196 01/03/2016   HDL 46 01/03/2016   LDLCALC 122 01/03/2016   TRIG 141 01/03/2016   CHOLHDL 4.3 01/03/2016      Also , patient has been on Thyroid replacement circa 2015. Patient has Morbid Obesity (BMI 35+) and Gluttony  consequent T2_NIDDM w/CKD 2 since 2011 with A1c 6.2% and 6.6% in Apr 2016 and patient denies reactive hypoglycemic symptoms, visual blurring, diabetic polys or paresthesias. Last A1c was not at goal: Lab Results  Component Value Date   HGBA1C 6.7 (H) 01/03/2016       Finally, patient has history of Vitamin D Deficiency of "6" in 2008 and last vitamin D was at goal: Lab Results  Component Value Date   VD25OH 54 01/03/2016   Current  Outpatient Prescriptions on File Prior to Visit  Medication Sig  . atenolol (TENORMIN) 50 MG tablet TAKE 1 TABLET BY MOUTH DAILY  . atorvastatin (LIPITOR) 80 MG tablet TAKE 1 TABLET (80 MG TOTAL) BY MOUTH DAILY. FOR CHOLESTEROL  . Cyanocobalamin (VITAMIN B-12 PO) Take 1,000 mcg by mouth daily.   Marland Kitchen levothyroxine (SYNTHROID, LEVOTHROID) 100 MCG tablet TAKE 1 TABLET (100 MCG TOTAL) BY MOUTH DAILY BEFORE BREAKFAST.  Marland Kitchen losartan (COZAAR) 100 MG tablet TAKE 1/2 TO 1 TABLET DAILY FOR BP  . Magnesium 400 MG CAPS Take 400 mg by mouth. Takes 4 per day  . metFORMIN (GLUCOPHAGE-XR) 500 MG 24 hr tablet TAKE 1 TABLET WITH BREAKFAST AND LUNCH & 2 TABLETS AT SUPPER FOR DIABETES  . Omega-3 Fatty Acids (FISH OIL PO) Take 2,000 mg by mouth daily.  Marland Kitchen omeprazole (PRILOSEC) 40 MG capsule TAKE ONE CAPSULE BY MOUTH TWICE A DAY  . Potassium 75 MG TABS Take by mouth daily.  . Vitamin D, Ergocalciferol, (DRISDOL) 50000 UNITS CAPS capsule Take 1 capsule (50,000 Units total) by mouth 3 (three) times a week.   No current facility-administered medications on file prior to visit.    Allergies  Allergen Reactions  . Lisinopril Cough   Past Medical History:  Diagnosis Date  . Adenomatous colon polyp 02/15/2009  . Bradycardia   . CAD (coronary artery disease)    remote MI in 2004  with PCI; s/p CABG x 3 03/2012  . Family history of malignant neoplasm of gastrointestinal tract   . GERD (gastroesophageal reflux disease)   . Hyperlipidemia   . Hypertension   . Myocardial infarction ([place])    2004  . Obesity   . Prediabetes   . 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 Tobacco abuse 03/28/2012  . Vitamin D deficiency    Health Maintenance  Topic Date Due  . OPHTHALMOLOGY EXAM  12/04/1961  . COLONOSCOPY  06/29/2015  . FOOT EXAM  06/16/2016  . HEMOGLOBIN A1C  07/02/2016  . INFLUENZA VACCINE  07/18/2016  . TETANUS/TDAP  01/05/2019  . PNEUMOCOCCAL POLYSACCHARIDE  VACCINE  Completed  . ZOSTAVAX  Completed  . Hepatitis C Screening  Completed  . HIV Screening  Completed   Immunization History  Administered Date(s) Administered  . PPD Test 06/15/2014, 07/10/2016  . Pneumococcal Polysaccharide-23 01/05/2009, 08/10/2014  . Td 01/05/2009  . Zoster 01/03/2016   Past Surgical History:  Procedure Laterality Date  . BACK SURGERY    . COLONOSCOPY  02/15/2009  . CORONARY ANGIOPLASTY WITH STENT PLACEMENT     about 8 years ago at [facility]  . CORONARY ARTERY BYPASS GRAFT  04/02/2012   Procedure: CORONARY ARTERY BYPASS GRAFTING (CABG);  Surgeon: Rexene Alberts, MD;  Location: [place];  Service: Open Heart Surgery;  Laterality: N/A;  On pump, times three graphs using endoscopically harvested right greater saphenous vein and left internal mammary artery.   Marland Kitchen KNEE SURGERY    . TONSILLECTOMY     Family History  Problem Relation Age of Onset  . Colon cancer Father   . Hypertension Mother   . Alzheimer's disease Mother   . Colon cancer Paternal Grandmother    Social History   Social History  . Marital status: Divorced    Spouse name: N/A  . Number of children: 0  . Years of education: N/A   Occupational History  . 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    Social History Main Topics  . Smoking status: Current Every Day Smoker    Packs/day: 2.00    Years: 40.00    Types: Cigarettes  . Smokeless tobacco: Never Used     Comment: down from 2ppd  . Alcohol use No     Comment: Rarely  . Drug use: No  . Sexual activity: No    ROS Constitutional: Denies fever, chills, weight loss/gain, headaches, insomnia,  night sweats or change in appetite. Does c/o fatigue. Eyes: Denies redness, blurred vision, diplopia, discharge, itchy or watery eyes.  ENT: Denies discharge, congestion, post nasal drip, epistaxis, sore throat, earache, hearing loss, dental pain, Tinnitus, Vertigo, Sinus pain or snoring.  Cardio: Denies chest pain, palpitations, irregular heartbeat,  syncope, dyspnea, diaphoresis, orthopnea, PND, claudication or edema Respiratory: denies cough, dyspnea, DOE, pleurisy, hoarseness, laryngitis or wheezing.  Gastrointestinal: Denies dysphagia, heartburn, reflux, water brash, pain, cramps, nausea, vomiting, bloating, diarrhea, constipation, hematemesis, melena, hematochezia, jaundice or hemorrhoids Genitourinary: Denies dysuria, frequency, urgency, nocturia, hesitancy, discharge, hematuria or flank pain Musculoskeletal: Denies arthralgia, myalgia, stiffness, Jt. Swelling, pain, limp or strain/sprain. Denies Falls. Skin: Denies puritis, rash, hives, warts, acne, eczema or change in skin lesion Neuro: No weakness, tremor, incoordination, spasms, paresthesia or pain Psychiatric: Denies confusion, memory loss or sensory loss. Denies Depression. Endocrine: Denies change in weight, skin, hair change, nocturia, and paresthesia, diabetic polys, visual blurring or  hyper / hypo glycemic episodes.  Heme/Lymph: No excessive bleeding, bruising or enlarged lymph nodes.  Physical Exam  BP 122/80   Pulse 60   Temp 97.7 F (36.5 C)   Ht 6' 2.25" (1.886 m)   Wt 277 lb 3.2 oz (125.7 kg)   BMI 35.35 kg/m   General Appearance: Well nourished, in no apparent distress. Eyes: PERRLA, EOMs, conjunctiva no swelling or erythema, normal fundi and vessels. Sinuses: No frontal/maxillary tenderness ENT/Mouth: EACs patent / TMs  nl. Nares clear without erythema, swelling, mucoid exudates. Oral hygiene is good. No erythema, swelling, or exudate. Tongue normal, non-obstructing. Tonsils not swollen or erythematous. Hearing normal.  Neck: Supple, thyroid normal. No bruits, nodes or JVD. Respiratory: Respiratory effort normal.  BS equal and clear bilateral without rales, rhonci, wheezing or stridor. Cardio: Heart sounds are normal with regular rate and rhythm and no murmurs, rubs or gallops. Peripheral pulses are normal and equal bilaterally without edema. No aortic or  femoral bruits. Chest: symmetric with normal excursions and percussion.  Abdomen: Soft, Obese , redundant panniculus  with Nl bowel sounds. Nontender, no guarding, rebound, hernias, masses, or organomegaly.  Lymphatics: Non tender without lymphadenopathy.  Genitourinary: No hernias.Testes nl. DRE - prostate nl for age - smooth & firm w/o nodules. Musculoskeletal: Full ROM all peripheral extremities, joint stability, 5/5 strength, and normal gait. Skin: Warm and dry without rashes, lesions, cyanosis, clubbing or  ecchymosis.  Neuro: Cranial nerves intact, reflexes equal bilaterally. Normal muscle tone, no cerebellar symptoms. Sensation intact to touch, vibratory & Monofilament to the toes bilaterally.   Pysch: Alert and oriented X 3 with flat affect and normal insight and judgment appropriate.   Assessment and Plan  1. Annual Preventative/Screening Exam   - Microalbumin / creatinine urine ratio - EKG 12-Lead - [place], RETROPERITNL ABD,  LTD - POC Hemoccult Bld/Stl  - Urinalysis, Routine w reflex microscopic - Vitamin B12 - Iron and TIBC - PSA - Testosterone - HM DIABETES FOOT EXAM - LOW EXTREMITY NEUR EXAM DOCUM - CBC with Differential/Platelet - BASIC METABOLIC PANEL WITH GFR - Hepatic function panel - Magnesium - Lipid panel - TSH - Hemoglobin A1c - Insulin, random - VITAMIN D 25 Hydroxy  2. Essential hypertension  - EKG 12-Lead - [place], RETROPERITNL ABD,  LTD - TSH  3. Hyperlipidemia  - Lipid panel - TSH  4. T2_NIDDM w/ CKD2  (HCC)  - Microalbumin / creatinine urine ratio - HM DIABETES FOOT EXAM - LOW EXTREMITY NEUR EXAM DOCUM - Hemoglobin A1c - Insulin, random - VITAMIN D 25 Hydroxy  5. Vitamin D deficiency   6. S/P CABG x 3   7. Gastroesophageal reflux disease   8. Obesity   9. Screening for rectal cancer  - POC Hemoccult Bld/Stl   10. Prostate cancer screening  - PSA  11. Other fatigue  - Vitamin B12 - Iron and TIBC - Testosterone - CBC  with Differential/Platelet - TSH  12. Screening for ischemic heart disease   13. Screening for AAA (aortic abdominal aneurysm)   14. Medication management  - Urinalysis, Routine w reflex microscopic  - CBC with Differential/Platelet - BASIC METABOLIC PANEL WITH GFR - Hepatic function panel - Magnesium   Continue prudent diet as discussed, weight control, BP monitoring, regular exercise, and medications as discussed.  Discussed med effects and SE's. Routine screening labs and tests as requested with regular follow-up as recommended. Over 40 minutes of exam, counseling, chart review and high complex critical decision making was performed

## 2016-07-10 NOTE — Patient Instructions (Signed)

## 2016-07-11 ENCOUNTER — Other Ambulatory Visit: Payer: Self-pay | Admitting: Internal Medicine

## 2016-07-11 LAB — VITAMIN D 25 HYDROXY (VIT D DEFICIENCY, FRACTURES): Vit D, 25-Hydroxy: 64 ng/mL (ref 30–100)

## 2016-07-11 LAB — URINALYSIS, ROUTINE W REFLEX MICROSCOPIC
Bilirubin Urine: NEGATIVE
Glucose, UA: NEGATIVE
Hgb urine dipstick: NEGATIVE
Ketones, ur: NEGATIVE
Leukocytes, UA: NEGATIVE
Nitrite: NEGATIVE
Protein, ur: NEGATIVE
Specific Gravity, Urine: 1.012 (ref 1.001–1.035)
pH: 7 (ref 5.0–8.0)

## 2016-07-11 LAB — INSULIN, RANDOM: Insulin: 5.3 u[IU]/mL (ref 2.0–19.6)

## 2016-07-11 LAB — PSA: PSA: 0.9 ng/mL (ref <=4.00)

## 2016-07-11 LAB — MICROALBUMIN / CREATININE URINE RATIO
Creatinine, Urine: 140 mg/dL (ref 20–370)
Microalb Creat Ratio: 4 mcg/mg creat (ref <30)
Microalb, Ur: 0.6 mg/dL

## 2016-07-18 ENCOUNTER — Other Ambulatory Visit: Payer: Self-pay | Admitting: Internal Medicine

## 2016-07-23 ENCOUNTER — Other Ambulatory Visit: Payer: Self-pay | Admitting: Internal Medicine

## 2016-08-11 ENCOUNTER — Encounter: Payer: Self-pay | Admitting: Internal Medicine

## 2016-08-17 ENCOUNTER — Encounter: Payer: Self-pay | Admitting: Internal Medicine

## 2016-09-21 ENCOUNTER — Other Ambulatory Visit: Payer: Self-pay | Admitting: Internal Medicine

## 2016-10-13 ENCOUNTER — Encounter: Payer: Self-pay | Admitting: Internal Medicine

## 2016-10-13 ENCOUNTER — Ambulatory Visit (INDEPENDENT_AMBULATORY_CARE_PROVIDER_SITE_OTHER): Payer: 59 | Admitting: Internal Medicine

## 2016-10-13 VITALS — BP 142/88 | HR 60 | Temp 98.2°F | Resp 18 | Ht 74.25 in | Wt 280.0 lb

## 2016-10-13 DIAGNOSIS — I10 Essential (primary) hypertension: Secondary | ICD-10-CM | POA: Diagnosis not present

## 2016-10-13 DIAGNOSIS — Z720 Tobacco use: Secondary | ICD-10-CM

## 2016-10-13 DIAGNOSIS — Z79899 Other long term (current) drug therapy: Secondary | ICD-10-CM | POA: Diagnosis not present

## 2016-10-13 DIAGNOSIS — E782 Mixed hyperlipidemia: Secondary | ICD-10-CM

## 2016-10-13 DIAGNOSIS — F411 Generalized anxiety disorder: Secondary | ICD-10-CM | POA: Diagnosis not present

## 2016-10-13 DIAGNOSIS — E559 Vitamin D deficiency, unspecified: Secondary | ICD-10-CM | POA: Diagnosis not present

## 2016-10-13 DIAGNOSIS — N182 Chronic kidney disease, stage 2 (mild): Secondary | ICD-10-CM

## 2016-10-13 DIAGNOSIS — E1122 Type 2 diabetes mellitus with diabetic chronic kidney disease: Secondary | ICD-10-CM | POA: Diagnosis not present

## 2016-10-13 LAB — LIPID PANEL
Cholesterol: 196 mg/dL (ref 125–200)
HDL: 47 mg/dL (ref >=40)
LDL Cholesterol: 118 mg/dL (ref <130)
Total CHOL/HDL Ratio: 4.2 Ratio (ref <=5.0)
Triglycerides: 153 mg/dL — ABNORMAL HIGH (ref <150)
VLDL: 31 mg/dL — ABNORMAL HIGH (ref <30)

## 2016-10-13 LAB — CBC WITH DIFFERENTIAL/PLATELET
Basophils Absolute: 66 cells/uL (ref 0–200)
Basophils Relative: 1 %
Eosinophils Absolute: 132 cells/uL (ref 15–500)
Eosinophils Relative: 2 %
HCT: 45.8 % (ref 38.5–50.0)
Hemoglobin: 15.6 g/dL (ref 13.2–17.1)
Lymphocytes Relative: 18 %
Lymphs Abs: 1188 cells/uL (ref 850–3900)
MCH: 29.8 pg (ref 27.0–33.0)
MCHC: 34.1 g/dL (ref 32.0–36.0)
MCV: 87.4 fL (ref 80.0–100.0)
MPV: 10.2 fL (ref 7.5–12.5)
Monocytes Absolute: 462 cells/uL (ref 200–950)
Monocytes Relative: 7 %
Neutro Abs: 4752 cells/uL (ref 1500–7800)
Neutrophils Relative %: 72 %
Platelets: 260 10*3/uL (ref 140–400)
RBC: 5.24 MIL/uL (ref 4.20–5.80)
RDW: 13.8 % (ref 11.0–15.0)
WBC: 6.6 10*3/uL (ref 3.8–10.8)

## 2016-10-13 LAB — BASIC METABOLIC PANEL WITH GFR
BUN: 15 mg/dL (ref 7–25)
CO2: 26 mmol/L (ref 20–31)
Calcium: 9.3 mg/dL (ref 8.6–10.3)
Chloride: 97 mmol/L — ABNORMAL LOW (ref 98–110)
Creat: 1.39 mg/dL — ABNORMAL HIGH (ref 0.70–1.25)
GFR, Est African American: 62 mL/min (ref >=60)
GFR, Est Non African American: 54 mL/min — ABNORMAL LOW (ref >=60)
Glucose, Bld: 132 mg/dL — ABNORMAL HIGH (ref 65–99)
Potassium: 4.8 mmol/L (ref 3.5–5.3)
Sodium: 132 mmol/L — ABNORMAL LOW (ref 135–146)

## 2016-10-13 LAB — HEPATIC FUNCTION PANEL
ALT: 12 U/L (ref 9–46)
AST: 13 U/L (ref 10–35)
Albumin: 4.2 g/dL (ref 3.6–5.1)
Alkaline Phosphatase: 86 U/L (ref 40–115)
Bilirubin, Direct: 0.1 mg/dL (ref <=0.2)
Indirect Bilirubin: 0.4 mg/dL (ref 0.2–1.2)
Total Bilirubin: 0.5 mg/dL (ref 0.2–1.2)
Total Protein: 6.5 g/dL (ref 6.1–8.1)

## 2016-10-13 LAB — HEMOGLOBIN A1C
Hgb A1c MFr Bld: 6.2 % — ABNORMAL HIGH (ref <5.7)
Mean Plasma Glucose: 131 mg/dL

## 2016-10-13 LAB — TSH: TSH: 1.73 mIU/L (ref 0.40–4.50)

## 2016-10-13 MED ORDER — ALPRAZOLAM ER 0.5 MG PO TB24: 0.5000 mg | ORAL_TABLET | Freq: Every day | ORAL | 0 refills | Status: DC

## 2016-10-13 NOTE — Progress Notes (Signed)
Assessment and Plan:  Hypertension:  -Continue medication,  -monitor blood pressure at home.  -Continue DASH diet.   -Reminder to go to the ER if any CP, SOB, nausea, dizziness, severe HA, changes vision/speech, left arm numbness and tingling, and jaw pain.  Cholesterol: -consider adding in statin therapy -Continue diet and exercise.  -Check cholesterol.   Diabetes: -needs tighter control of cbgs -encouraged to check Cbgs at home -cont metformin -consider adding in glipizide if not controlled -Continue diet and exercise.  -Check A1C  Vitamin D Def: -check level -continue medications.   Anxiety -controlled substances list reviewed and is negative for abuse -xanax xr -recommended that patient look in to counseling which he refused -patient aware that refill will not be done early and distributing this medication improperly will result in  Possible felony.    Continue diet and meds as discussed. Further disposition pending results of labs.  HPI 65 y.o. male  presents for 3 month follow up with hypertension, hyperlipidemia, prediabetes and vitamin D.   His blood pressure has been controlled at home, today their BP is BP: (!) 142/88.   He does not workout. He denies chest pain, shortness of breath, dizziness.   He is on cholesterol medication and denies myalgias. His cholesterol is at goal. The cholesterol last visit was:   Lab Results  Component Value Date   CHOL 207 (H) 07/10/2016   HDL 49 07/10/2016   LDLCALC 120 07/10/2016   TRIG 189 (H) 07/10/2016   CHOLHDL 4.2 07/10/2016     He has been working on diet and exercise for prediabetes, and denies foot ulcerations, hyperglycemia, hypoglycemia , increased appetite, nausea, paresthesia of the feet, polydipsia, polyuria, visual disturbances and vomiting. Last A1C in the office was:  Lab Results  Component Value Date   HGBA1C 6.6 (H) 07/10/2016  He reports that he does not typically check his blood sugars at home.  He is  counting his work as exercise.    Patient is on Vitamin D supplement.  Lab Results  Component Value Date   VD25OH 64 07/10/2016     Patient reports that he did not like the zoloft that he was given. He feels like it made him paranoid and also made him more emotionally unstable.  He would rather take valium.   He is not interested in currently taking another antidepressant but does want to try an anxiolytic.   He is still smoking. He is not intersted in quitting.    Current Medications:  Current Outpatient Prescriptions on File Prior to Visit  Medication Sig Dispense Refill  . atenolol (TENORMIN) 50 MG tablet TAKE 1 TABLET BY MOUTH DAILY 30 tablet 3  . Cyanocobalamin (VITAMIN B-12 PO) Take 1,000 mcg by mouth daily.     Marland Kitchen ezetimibe (ZETIA) 10 MG tablet TAKE 1 TABLET BY MOUTH EVERY DAY FOR CHOLESTEROL 90 tablet 1  . levothyroxine (SYNTHROID, LEVOTHROID) 100 MCG tablet TAKE 1 TABLET (100 MCG TOTAL) BY MOUTH DAILY BEFORE BREAKFAST. 90 tablet 1  . lisinopril (PRINIVIL,ZESTRIL) 10 MG tablet Take 10 mg by mouth daily.  2  . losartan (COZAAR) 100 MG tablet TAKE 1/2 TO 1 TABLET DAILY FOR BP 30 tablet 3  . Magnesium 400 MG CAPS Take 400 mg by mouth. Takes 4 per day    . metFORMIN (GLUCOPHAGE-XR) 500 MG 24 hr tablet TAKE 1 TABLET WITH BREAKFAST AND LUNCH & 2 TABLETS AT SUPPER FOR DIABETES 360 tablet 0  . Omega-3 Fatty Acids (FISH OIL PO) Take  2,000 mg by mouth daily.    Marland Kitchen omeprazole (PRILOSEC) 40 MG capsule TAKE ONE CAPSULE BY MOUTH TWICE A DAY 180 capsule 0  . Potassium 75 MG TABS Take by mouth daily.    . sertraline (ZOLOFT) 50 MG tablet Take 1 tablet daily for mood 90 tablet 1  . Vitamin D, Ergocalciferol, (DRISDOL) 50000 UNITS CAPS capsule Take 1 capsule (50,000 Units total) by mouth 3 (three) times a week. 30 capsule 3   No current facility-administered medications on file prior to visit.     Medical History:  Past Medical History:  Diagnosis Date  . Adenomatous colon polyp 02/15/2009  .  Bradycardia   . CAD (coronary artery disease)    remote MI in 2004 with PCI; s/p CABG x 3 03/2012  . Family history of malignant neoplasm of gastrointestinal tract   . GERD (gastroesophageal reflux disease)   . Hyperlipidemia   . Hypertension   . Myocardial infarction    2004  . Obesity   . Prediabetes   . 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 Tobacco abuse 03/28/2012  . Vitamin D deficiency     Allergies:  Allergies  Allergen Reactions  . Lisinopril Cough     Review of Systems:  Review of Systems  Constitutional: Negative for chills, fever and malaise/fatigue.  HENT: Negative for congestion, ear pain and sore throat.   Eyes: Negative.   Respiratory: Negative for cough, shortness of breath and wheezing.   Cardiovascular: Negative for chest pain, palpitations and leg swelling.  Gastrointestinal: Negative for abdominal pain, blood in stool, constipation, diarrhea, heartburn and melena.  Genitourinary: Negative.   Skin: Negative.   Neurological: Negative for dizziness, sensory change, loss of consciousness and headaches.  Psychiatric/Behavioral: Negative for depression. The patient is not nervous/anxious and does not have insomnia.     Family history- Review and unchanged  Social history- Review and unchanged  Physical Exam: BP (!) 142/88   Pulse 60   Temp 98.2 F (36.8 C) (Temporal)   Resp 18   Ht 6' 2.25" (1.886 m)   Wt 280 lb (127 kg)   BMI 35.71 kg/m  Wt Readings from Last 3 Encounters:  10/13/16 280 lb (127 kg)  07/10/16 277 lb 3.2 oz (125.7 kg)  01/03/16 285 lb 11.2 oz (129.6 kg)    General Appearance: Well nourished well developed, in no apparent distress. Eyes: PERRLA, EOMs, conjunctiva no swelling or erythema ENT/Mouth: Ear canals normal without obstruction, swelling, erythma, discharge.  TMs normal bilaterally.  Oropharynx moist, clear, without exudate, or postoropharyngeal swelling. Neck: Supple,  thyroid normal,no cervical adenopathy  Respiratory: Respiratory effort normal, Breath sounds clear A&P without rhonchi, wheeze, or rale.  No retractions, no accessory usage. Cardio: RRR with no MRGs. Brisk peripheral pulses without edema.  Abdomen: Soft, + BS,  Non tender, no guarding, rebound, hernias, masses. Musculoskeletal: Full ROM, 5/5 strength, Normal gait Skin: Warm, dry without rashes, lesions, ecchymosis.  Neuro: Awake and oriented X 3, Cranial nerves intact. Normal muscle tone, no cerebellar symptoms. Psych: Normal affect, Insight and Judgment appropriate.    Starlyn Skeans, PA-C 10:12 AM [facility]

## 2016-11-01 ENCOUNTER — Other Ambulatory Visit: Payer: Self-pay | Admitting: Internal Medicine

## 2016-11-21 ENCOUNTER — Telehealth: Payer: Self-pay | Admitting: *Deleted

## 2016-11-21 NOTE — Telephone Encounter (Signed)
-----   Message from [name], [name] sent at 10/16/2016  8:36 AM EDT ----- Regarding: Curtis Perez  1 month lab only to recheck your kidney functions.

## 2016-11-21 NOTE — Telephone Encounter (Signed)
Called pt several times with no response 

## 2016-11-30 ENCOUNTER — Other Ambulatory Visit: Payer: Self-pay | Admitting: Physician Assistant

## 2016-11-30 ENCOUNTER — Other Ambulatory Visit: Payer: Self-pay | Admitting: Internal Medicine

## 2016-12-16 ENCOUNTER — Other Ambulatory Visit: Payer: Self-pay | Admitting: Internal Medicine

## 2016-12-16 DIAGNOSIS — F411 Generalized anxiety disorder: Secondary | ICD-10-CM

## 2016-12-16 NOTE — Telephone Encounter (Signed)
Please call Alprazolam 

## 2017-01-19 ENCOUNTER — Other Ambulatory Visit: Payer: Self-pay | Admitting: Internal Medicine

## 2017-01-19 ENCOUNTER — Encounter: Payer: Self-pay | Admitting: Internal Medicine

## 2017-01-19 ENCOUNTER — Ambulatory Visit (INDEPENDENT_AMBULATORY_CARE_PROVIDER_SITE_OTHER): Payer: 59 | Admitting: Internal Medicine

## 2017-01-19 VITALS — BP 162/88 | HR 76 | Temp 97.3°F | Resp 16 | Ht 74.25 in | Wt 285.2 lb

## 2017-01-19 DIAGNOSIS — E1122 Type 2 diabetes mellitus with diabetic chronic kidney disease: Secondary | ICD-10-CM | POA: Diagnosis not present

## 2017-01-19 DIAGNOSIS — R632 Polyphagia: Secondary | ICD-10-CM | POA: Diagnosis not present

## 2017-01-19 DIAGNOSIS — I10 Essential (primary) hypertension: Secondary | ICD-10-CM | POA: Diagnosis not present

## 2017-01-19 DIAGNOSIS — Z951 Presence of aortocoronary bypass graft: Secondary | ICD-10-CM

## 2017-01-19 DIAGNOSIS — Z79899 Other long term (current) drug therapy: Secondary | ICD-10-CM | POA: Diagnosis not present

## 2017-01-19 DIAGNOSIS — E782 Mixed hyperlipidemia: Secondary | ICD-10-CM

## 2017-01-19 DIAGNOSIS — E559 Vitamin D deficiency, unspecified: Secondary | ICD-10-CM | POA: Diagnosis not present

## 2017-01-19 DIAGNOSIS — E039 Hypothyroidism, unspecified: Secondary | ICD-10-CM

## 2017-01-19 DIAGNOSIS — K219 Gastro-esophageal reflux disease without esophagitis: Secondary | ICD-10-CM

## 2017-01-19 DIAGNOSIS — N182 Chronic kidney disease, stage 2 (mild): Secondary | ICD-10-CM | POA: Diagnosis not present

## 2017-01-19 DIAGNOSIS — F411 Generalized anxiety disorder: Secondary | ICD-10-CM

## 2017-01-19 LAB — CBC WITH DIFFERENTIAL/PLATELET
Basophils Absolute: 58 cells/uL (ref 0–200)
Basophils Relative: 1 %
Eosinophils Absolute: 232 cells/uL (ref 15–500)
Eosinophils Relative: 4 %
HCT: 43.9 % (ref 38.5–50.0)
Hemoglobin: 14.8 g/dL (ref 13.2–17.1)
Lymphocytes Relative: 27 %
Lymphs Abs: 1566 cells/uL (ref 850–3900)
MCH: 29.8 pg (ref 27.0–33.0)
MCHC: 33.7 g/dL (ref 32.0–36.0)
MCV: 88.5 fL (ref 80.0–100.0)
MPV: 10.9 fL (ref 7.5–12.5)
Monocytes Absolute: 522 cells/uL (ref 200–950)
Monocytes Relative: 9 %
Neutro Abs: 3422 cells/uL (ref 1500–7800)
Neutrophils Relative %: 59 %
Platelets: 209 10*3/uL (ref 140–400)
RBC: 4.96 MIL/uL (ref 4.20–5.80)
RDW: 14.5 % (ref 11.0–15.0)
WBC: 5.8 10*3/uL (ref 3.8–10.8)

## 2017-01-19 LAB — HEPATIC FUNCTION PANEL
ALT: 12 U/L (ref 9–46)
AST: 14 U/L (ref 10–35)
Albumin: 3.9 g/dL (ref 3.6–5.1)
Alkaline Phosphatase: 90 U/L (ref 40–115)
Bilirubin, Direct: 0.1 mg/dL (ref <=0.2)
Indirect Bilirubin: 0.3 mg/dL (ref 0.2–1.2)
Total Bilirubin: 0.4 mg/dL (ref 0.2–1.2)
Total Protein: 6.5 g/dL (ref 6.1–8.1)

## 2017-01-19 LAB — BASIC METABOLIC PANEL WITH GFR
BUN: 17 mg/dL (ref 7–25)
CO2: 24 mmol/L (ref 20–31)
Calcium: 9.4 mg/dL (ref 8.6–10.3)
Chloride: 101 mmol/L (ref 98–110)
Creat: 1.17 mg/dL (ref 0.70–1.25)
GFR, Est African American: 76 mL/min (ref >=60)
GFR, Est Non African American: 66 mL/min (ref >=60)
Glucose, Bld: 112 mg/dL — ABNORMAL HIGH (ref 65–99)
Potassium: 4.6 mmol/L (ref 3.5–5.3)
Sodium: 138 mmol/L (ref 135–146)

## 2017-01-19 LAB — LIPID PANEL
Cholesterol: 212 mg/dL — ABNORMAL HIGH (ref <200)
HDL: 37 mg/dL — ABNORMAL LOW (ref >40)
Total CHOL/HDL Ratio: 5.7 Ratio — ABNORMAL HIGH (ref <5.0)
Triglycerides: 531 mg/dL — ABNORMAL HIGH (ref <150)

## 2017-01-19 LAB — TSH: TSH: 2.41 mIU/L (ref 0.40–4.50)

## 2017-01-19 LAB — HEMOGLOBIN A1C
Hgb A1c MFr Bld: 6.3 % — ABNORMAL HIGH (ref <5.7)
Mean Plasma Glucose: 134 mg/dL

## 2017-01-19 NOTE — Progress Notes (Signed)
[facility] Unk Pinto, M.D.        [facility]. [facility], P.A.-C       Starlyn Skeans, P.A.-C  [facility]                [address] [place], [place] [id number] Telephone [phone] Telefax [phone] ______________________________________________________________________     This very nice 66 y.o. 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 Patient also has GERD controlled on current meds.      Patient is treated for HTN since 2005 at presentation undergoing PCA & Stenting. Then in 2013 he represented with ACS and underwent CABG at age 8 yo. Marland Kitchen  He reports that he does not monitor BP's. Today's BP is elevated at 162/88 and he relates that he "forgot" to take his Losartan & Atenolol this morning.  Patient has had no complaints of any cardiac type chest pain, palpitations, dyspnea/orthopnea/PND, dizziness, claudication, or dependent edema.     Hyperlipidemia is controlled with diet & Ezetimibe as he has alleged intolerance to statins in the past. Patient denies myalgias or other med SE's on Ezetimibe. . Last Lipids were at goal: Lab Results  Component Value Date   CHOL 196 10/13/2016   HDL 47 10/13/2016   LDLCALC 118 10/13/2016   TRIG 153 (H) 10/13/2016   CHOLHDL 4.2 10/13/2016      Patient has been on Thyroid replacement since 2015. Also, the patient is not diet compliant with Gluttony/Morbid Obesity (BMI 36+) and consequent PreDiabetes in 2011 (A1c 6.2%) and in Apr 2016 his A1c was 6.6%T2_NIDDM circa 2011 and also has associated CKD2. He does not/will not monitor his CBG's.  He denies symptoms of reactive hypoglycemia, diabetic polys, paresthesias or visual blurring.  Last A1c was  Lab Results  Component Value Date   HGBA1C 6.2 (H) 10/13/2016      Further, the patient also has history of Vitamin D Deficiency  and supplements vitamin D without any suspected side-effects. Last vitamin D was at goal:   Lab Results  Component Value Date   VD25OH 64 07/10/2016   Current Outpatient Prescriptions on File Prior to Visit  Medication Sig  . ALPRAZolam (XANAX XR) 0.5 MG 24 hr tablet TAKE 1 TABLET EVERY DAY  . atenolol (TENORMIN) 50 MG tablet TAKE 1 TABLET BY MOUTH DAILY  . Cyanocobalamin (VITAMIN B-12 PO) Take 1,000 mcg by mouth daily.   Marland Kitchen ezetimibe (ZETIA) 10 MG tablet TAKE 1 TABLET BY MOUTH EVERY DAY FOR CHOLESTEROL  . levothyroxine (SYNTHROID, LEVOTHROID) 100 MCG tablet TAKE 1 TABLET (100 MCG TOTAL) BY MOUTH DAILY BEFORE BREAKFAST.  Marland Kitchen losartan (COZAAR) 100 MG tablet TAKE 1/2 TO 1 TABLET DAILY FOR BLOOD PRESSURE  . Magnesium 400 MG CAPS Take 400 mg by mouth. Takes 4 per day  . metFORMIN (GLUCOPHAGE-XR) 500 MG 24 hr tablet TAKE 1 TABLET WITH BREAKFAST AND LUNCH & 2 TABLETS AT SUPPER FOR DIABETES  . Omega-3 Fatty Acids (FISH OIL PO) Take 2,000 mg by mouth daily.  Marland Kitchen omeprazole (PRILOSEC) 40 MG capsule TAKE ONE CAPSULE BY MOUTH TWICE A DAY  . Potassium 75 MG TABS Take by mouth daily.  . Vitamin D, Ergocalciferol, (DRISDOL) 50000 UNITS CAPS capsule Take 1 capsule (50,000 Units total) by mouth 3 (three) times a week.  Marland Kitchen lisinopril (PRINIVIL,ZESTRIL) 10 MG tablet Take  10 mg by mouth daily.   No current facility-administered medications on file prior to visit.    Allergies  Allergen Reactions  . Lisinopril Cough   PMHx:   Past Medical History:  Diagnosis Date  . Adenomatous colon polyp 02/15/2009  . Bradycardia   . CAD (coronary artery disease)    remote MI in 2004 with PCI; s/p CABG x 3 03/2012  . Family history of malignant neoplasm of gastrointestinal tract   . GERD (gastroesophageal reflux disease)   . Hyperlipidemia   . Hypertension   . Myocardial infarction    2004  . Obesity   . Prediabetes   . 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  Tobacco abuse 03/28/2012  . Vitamin D deficiency    Immunization History  Administered Date(s) Administered  . PPD Test 06/15/2014, 07/10/2016  . Pneumococcal Polysaccharide-23 01/05/2009, 08/10/2014  . Td 01/05/2009  . Zoster 01/03/2016   Past Surgical History:  Procedure Laterality Date  . BACK SURGERY    . COLONOSCOPY  02/15/2009  . CORONARY ANGIOPLASTY WITH STENT PLACEMENT     about 8 years ago at [facility]  . CORONARY ARTERY BYPASS GRAFT  04/02/2012   Procedure: CORONARY ARTERY BYPASS GRAFTING (CABG);  Surgeon: Rexene Alberts, MD;  Location: [facility];  Service: Open Heart Surgery;  Laterality: N/A;  On pump, times three graphs using endoscopically harvested right greater saphenous vein and left internal mammary artery.   Marland Kitchen KNEE SURGERY    . TONSILLECTOMY     FHx:    Reviewed / unchanged  SHx:    Reviewed / unchanged  Systems Review:  Constitutional: Denies fever, chills, wt changes, headaches, insomnia, fatigue, night sweats, change in appetite. Eyes: Denies redness, blurred vision, diplopia, discharge, itchy, watery eyes.  ENT: Denies discharge, congestion, post nasal drip, epistaxis, sore throat, earache, hearing loss, dental pain, tinnitus, vertigo, sinus pain, snoring.  CV: Denies chest pain, palpitations, irregular heartbeat, syncope, dyspnea, diaphoresis, orthopnea, PND, claudication or edema. Respiratory: denies cough, dyspnea, DOE, pleurisy, hoarseness, laryngitis, wheezing.  Gastrointestinal: Denies dysphagia, odynophagia, heartburn, reflux, water brash, abdominal pain or cramps, nausea, vomiting, bloating, diarrhea, constipation, hematemesis, melena, hematochezia  or hemorrhoids. Genitourinary: Denies dysuria, frequency, urgency, nocturia, hesitancy, discharge, hematuria or flank pain. Musculoskeletal: Denies arthralgias, myalgias, stiffness, jt. swelling, pain, limping or strain/sprain.  Skin: Denies pruritus, rash, hives, warts, acne, eczema or change in skin  lesion(s). Neuro: No weakness, tremor, incoordination, spasms, paresthesia or pain. Psychiatric: Denies confusion, memory loss or sensory loss. Endo: Denies change in weight, skin or hair change.  Heme/Lymph: No excessive bleeding, bruising or enlarged lymph nodes.  Physical Exam  BP (!) 162/88   Pulse 76   Temp 97.3 F (36.3 C)   Resp 16   Ht 6' 2.25" (1.886 m)   Wt 285 lb 3.2 oz (129.4 kg)   BMI 36.37 kg/m   Appears well nourished and in no distress.  Eyes: PERRLA, EOMs, conjunctiva no swelling or erythema. Sinuses: No frontal/maxillary tenderness ENT/Mouth: EAC's clear, TM's nl w/o erythema, bulging. Nares clear w/o erythema, swelling, exudates. Oropharynx clear without erythema or exudates. Oral hygiene is good. Tongue normal, non obstructing. Hearing intact.  Neck: Supple. Thyroid nl. Car 2+/2+ without bruits, nodes or JVD. Chest: Respirations nl with BS clear & equal w/o rales, rhonchi, wheezing or stridor.  Cor: Heart sounds normal w/ regular rate and rhythm without sig. murmurs, gallops,  clicks, or rubs. Peripheral pulses normal and equal  without edema.  Abdomen: Soft, obese & bowel sounds normal. Non-tender w/o guarding, rebound, hernias, masses, or organomegaly.  Lymphatics: Unremarkable.  Musculoskeletal: Full ROM all peripheral extremities, joint stability, 5/5 strength, and normal gait.  Skin: Warm, dry without exposed rashes, lesions or ecchymosis apparent.  Neuro: Cranial nerves intact, reflexes equal bilaterally. Sensory-motor testing grossly intact. Tendon reflexes grossly intact.  Pysch: Alert & oriented x 3.  Insight and judgement nl & appropriate. No ideations.  Assessment and Plan:  1. Essential hypertension  - Continue medication, monitor blood pressure at home.  - Continue DASH diet. Reminder to go to the ER if any CP,  SOB, nausea, dizziness, severe HA, changes vision/speech,  left arm numbness and tingling and jaw pain. - CBC with  Differential/Platelet - BASIC METABOLIC PANEL WITH GFR - TSH  2. Mixed hyperlipidemia  - Continue diet/meds, exercise,& lifestyle modifications.  - Continue monitor periodic cholesterol/liver & renal functions  - Hepatic function panel - Lipid panel - TSH  3. Type 2 diabetes mellitus with stage 2 chronic kidney disease, without long-term current use of insulin (HCC)  - Continue diet, exercise, lifestyle modifications.  - Monitor appropriate labs. - Hemoglobin A1c - Insulin, random  4. Vitamin D deficiency  - Continue supplementation. - VITAMIN D 25 Hydroxy  5. S/P CABG x 3   6. Gluttony   7. Hypothyroidism, unspecified type  - TSH  8. Gastroesophageal reflux disease   9. Medication management  - CBC with Differential/Platelet - BASIC METABOLIC PANEL WITH GFR - Hepatic function panel - Magnesium - Lipid panel - TSH - Hemoglobin A1c - VITAMIN D 25 Hydroxy         Recommended regular exercise, BP monitoring, weight control, and discussed med and SE's. Recommended labs to assess and monitor clinical status. Further disposition pending results of labs. Over 30 minutes of exam, counseling, chart review was performed

## 2017-01-19 NOTE — Patient Instructions (Signed)

## 2017-01-20 LAB — VITAMIN D 25 HYDROXY (VIT D DEFICIENCY, FRACTURES): Vit D, 25-Hydroxy: 69 ng/mL (ref 30–100)

## 2017-01-20 LAB — INSULIN, RANDOM: Insulin: 15.8 u[IU]/mL (ref 2.0–19.6)

## 2017-01-20 LAB — MAGNESIUM: Magnesium: 2 mg/dL (ref 1.5–2.5)

## 2017-01-20 NOTE — Telephone Encounter (Signed)
Please call alprazolam 

## 2017-01-21 ENCOUNTER — Encounter: Payer: Self-pay | Admitting: Internal Medicine

## 2017-01-22 ENCOUNTER — Other Ambulatory Visit: Payer: Self-pay | Admitting: Internal Medicine

## 2017-01-22 MED ORDER — FLUOXETINE HCL 20 MG PO CAPS: ORAL_CAPSULE | ORAL | 2 refills | Status: DC

## 2017-01-29 ENCOUNTER — Other Ambulatory Visit: Payer: Self-pay | Admitting: Internal Medicine

## 2017-03-28 ENCOUNTER — Other Ambulatory Visit: Payer: Self-pay | Admitting: Internal Medicine

## 2017-04-17 ENCOUNTER — Encounter: Payer: Self-pay | Admitting: Gastroenterology

## 2017-04-22 NOTE — Progress Notes (Deleted)
Patient ID: Curtis Perez, male   DOB: [birth year], 66 y.o.   MRN: [id number]  Assessment and Plan:  Hypertension:  -Continue medication -monitor blood pressure at home. -Continue DASH diet -Reminder to go to the ER if any CP, SOB, nausea, dizziness, severe HA, changes vision/speech, left arm numbness and tingling and jaw pain.  Cholesterol - Continue diet and exercise -Check cholesterol.   Diabetes without complications -Continue diet and exercise.  -Check A1C  Vitamin D Def -check level -continue medications.   Continue diet and meds as discussed. Further disposition pending results of labs. Discussed med's effects and SE's.    HPI 66 y.o. male  presents for 3 month follow up with hypertension, hyperlipidemia, diabetes and vitamin D deficiency.   His blood pressure has been controlled at home, today their BP is  .He does workout. He denies chest pain, shortness of breath, dizziness.   He is on cholesterol medication and denies myalgias. His cholesterol is not at goal. The cholesterol was:  01/19/2017: Cholesterol 212; HDL 37; LDL Cholesterol NOT CALC; Triglycerides 531   He has not been working on diet and exercise for diabetes with diabetic chronic kidney disease, he is on bASA, he is on ACE/ARB, and denies  foot ulcerations, hyperglycemia, hypoglycemia , increased appetite, nausea, paresthesia of the feet, polydipsia, polyuria, visual disturbances, vomiting and weight loss. Last A1C was: 01/19/2017: Hgb A1c MFr Bld 6.3.    Patient is on Vitamin D supplement. 01/19/2017: Vit D, 25-Hydroxy 69  He is on thyroid medication. His medication {WAS/WAS NOT:[phone]::"was not"} changed last visit.   Lab Results  Component Value Date   TSH 2.41 01/19/2017  .  BMI is There is no height or weight on file to calculate BMI., he is working on diet and exercise. Wt Readings from Last 3 Encounters:  01/19/17 285 lb 3.2 oz (129.4 kg)  10/13/16 280 lb (127 kg)  07/10/16 277 lb 3.2 oz (125.7 kg)      Current Medications:  Current Outpatient Prescriptions on File Prior to Visit  Medication Sig Dispense Refill  . ALPRAZolam (XANAX XR) 0.5 MG 24 hr tablet Take 1/2 to 1 tablet at hour of sleep as needed 90 tablet 1  . atenolol (TENORMIN) 50 MG tablet TAKE 1 TABLET BY MOUTH DAILY 30 tablet 3  . Cyanocobalamin (VITAMIN B-12 PO) Take 1,000 mcg by mouth daily.     Marland Kitchen ezetimibe (ZETIA) 10 MG tablet TAKE 1 TABLET BY MOUTH EVERY DAY FOR CHOLESTEROL 90 tablet 1  . ezetimibe (ZETIA) 10 MG tablet TAKE 1 TABLET BY MOUTH EVERY DAY FOR CHOLESTEROL 90 tablet 1  . FLUoxetine (PROZAC) 20 MG capsule Take 1 capsule daily for mood 90 capsule 2  . levothyroxine (SYNTHROID, LEVOTHROID) 100 MCG tablet TAKE 1 TABLET (100 MCG TOTAL) BY MOUTH DAILY BEFORE BREAKFAST. 90 tablet 1  . lisinopril (PRINIVIL,ZESTRIL) 10 MG tablet Take 10 mg by mouth daily.  2  . losartan (COZAAR) 100 MG tablet TAKE 1/2 TO 1 TABLET DAILY FOR BLOOD PRESSURE 30 tablet 3  . Magnesium 400 MG CAPS Take 400 mg by mouth. Takes 4 per day    . metFORMIN (GLUCOPHAGE-XR) 500 MG 24 hr tablet TAKE 1 TABLET WITH BREAKFAST AND LUNCH & 2 TABLETS AT SUPPER FOR DIABETES 360 tablet 0  . Omega-3 Fatty Acids (FISH OIL PO) Take 2,000 mg by mouth daily.    Marland Kitchen omeprazole (PRILOSEC) 40 MG capsule TAKE ONE CAPSULE BY MOUTH TWICE A DAY 180 capsule 0  . Potassium  75 MG TABS Take by mouth daily.    . Vitamin D, Ergocalciferol, (DRISDOL) 50000 UNITS CAPS capsule Take 1 capsule (50,000 Units total) by mouth 3 (three) times a week. 30 capsule 3   No current facility-administered medications on file prior to visit.    Medical History:  Past Medical History:  Diagnosis Date  . Adenomatous colon polyp 02/15/2009  . Bradycardia   . CAD (coronary artery disease)    remote MI in 2004 with PCI; s/p CABG x 3 03/2012  . Family history of malignant neoplasm of gastrointestinal tract   . GERD (gastroesophageal reflux disease)   . Hyperlipidemia   . Hypertension   .  Myocardial infarction    2004  . Obesity   . Prediabetes   . 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 Tobacco abuse 03/28/2012  . Vitamin D deficiency    Allergies:  Allergies  Allergen Reactions  . Lisinopril Cough     Review of Systems:  Review of Systems  Constitutional: Negative for chills and fever.  HENT: Negative for congestion, ear discharge and sore throat.   Eyes: Negative.   Respiratory: Negative for cough, shortness of breath and wheezing.   Cardiovascular: Negative for chest pain, palpitations and leg swelling.  Gastrointestinal: Negative for blood in stool, constipation, diarrhea, heartburn and melena.  Genitourinary: Negative.   Neurological: Negative for dizziness, sensory change, loss of consciousness and headaches.  Psychiatric/Behavioral: Negative for depression. The patient is not nervous/anxious and does not have insomnia.     Family history- Review and unchanged  Social history- Review and unchanged  Physical Exam: There were no vitals taken for this visit. Wt Readings from Last 3 Encounters:  01/19/17 285 lb 3.2 oz (129.4 kg)  10/13/16 280 lb (127 kg)  07/10/16 277 lb 3.2 oz (125.7 kg)   General Appearance: Well nourished well developed, non-toxic appearing, in no apparent distress. Eyes: PERRLA, EOMs, conjunctiva no swelling or erythema ENT/Mouth: Ear canals clear with no erythema, swelling, or discharge.  TMs normal bilaterally, oropharynx clear, moist, with no exudate.   Neck: Supple, thyroid normal, no JVD, no cervical adenopathy.  Respiratory: Respiratory effort normal, breath sounds clear A&P, no wheeze, rhonchi or rales noted.  No retractions, no accessory muscle usage Cardio: RRR with no MRGs. No noted edema.  Abdomen: Soft, + BS.  Non tender, no guarding, rebound, hernias, masses. Musculoskeletal: Full ROM, 5/5 strength, Normal gait Skin: Warm, dry without rashes, lesions, ecchymosis.   Neuro: Awake and oriented X 3, Cranial nerves intact. No cerebellar symptoms.  Psych: normal affect, Insight and Judgment appropriate.    Vicie Mutters, PA-C 3:46 PM [facility]

## 2017-04-23 ENCOUNTER — Ambulatory Visit: Payer: Self-pay | Admitting: Physician Assistant

## 2017-04-29 ENCOUNTER — Other Ambulatory Visit: Payer: Self-pay | Admitting: Internal Medicine

## 2017-05-20 DIAGNOSIS — I252 Old myocardial infarction: Secondary | ICD-10-CM | POA: Insufficient documentation

## 2017-05-20 NOTE — Progress Notes (Signed)
Patient ID: Curtis Perez, male   DOB: 02-21-1951, 66 y.o.   MRN: [id number]  Assessment and Plan:  Hypertension:  -Continue medication -monitor blood pressure at home. -Continue DASH diet -Reminder to go to the ER if any CP, SOB, nausea, dizziness, severe HA, changes vision/speech, left arm numbness and tingling and jaw pain.  Cholesterol - Continue diet and exercise -Check cholesterol.   Diabetes without complications -Continue diet and exercise.  -Check A1C  Vitamin D Def -check level -continue medications.   Right shoulder pain Likely bicep tendon versus impingement exercises given If not better ortho  Morbid Obesity with co morbidities - long discussion about weight loss, diet, and exercise  Depression - continue medications, stress management techniques discussed, increase water, good sleep hygiene discussed, increase exercise, and increase veggies.  - increase prozac to 40mg , if not better may try wellbutrin  Continue diet and meds as discussed. Further disposition pending results of labs. Discussed med's effects and SE's.   Future Appointments Date Time Provider [place]  06/11/2017 2:30 PM [facility] PRE[facility]IT RM[facility]  06/25/2017 10:30 AM Armbruster, Renelda Loma, MD [facility]  08/13/2017 2:00 PM Unk Pinto, MD [facility] None    HPI 66 y.o. male  presents for 3 month follow up with hypertension, hyperlipidemia, diabetes and vitamin D deficiency.   His blood pressure has been controlled at home, today their BP is BP: 132/76.He does workout. He denies chest pain, shortness of breath, dizziness.  He is left handed, has right shoulder pain x 3 months, no injury. Hurts with abduction to 90 degrees but better after that, worse while sleeping, no pain down his arm, no numbness, tingling.    He is on cholesterol medication and denies myalgias. His cholesterol is not at goal. The cholesterol was:  01/19/2017: Cholesterol 212; HDL 37; LDL  Cholesterol NOT CALC; Triglycerides 531   He has not been working on diet and exercise for diabetes with diabetic chronic kidney disease, he is on bASA, he is on ACE/ARB, and denies  foot ulcerations, hyperglycemia, hypoglycemia , increased appetite, nausea, paresthesia of the feet, polydipsia, polyuria, visual disturbances, vomiting and weight loss. Last A1C was: 01/19/2017: Hgb A1c MFr Bld 6.3.    Patient is on Vitamin D supplement. 01/19/2017: Vit D, 25-Hydroxy 69  He is on thyroid medication. His medication was not changed last visit.   Lab Results  Component Value Date   TSH 2.41 01/19/2017  .  BMI is Body mass index is 34.53 kg/m., he is working on diet and exercise. Wt Readings from Last 3 Encounters:  05/21/17 270 lb 12.8 oz (122.8 kg)  01/19/17 285 lb 3.2 oz (129.4 kg)  10/13/16 280 lb (127 kg)     Current Medications:  Current Outpatient Prescriptions on File Prior to Visit  Medication Sig Dispense Refill  . ALPRAZolam (XANAX XR) 0.5 MG 24 hr tablet Take 1/2 to 1 tablet at hour of sleep as needed 90 tablet 1  . atenolol (TENORMIN) 50 MG tablet TAKE 1 TABLET BY MOUTH DAILY 30 tablet 3  . Cyanocobalamin (VITAMIN B-12 PO) Take 1,000 mcg by mouth daily.     Marland Kitchen ezetimibe (ZETIA) 10 MG tablet TAKE 1 TABLET BY MOUTH EVERY DAY FOR CHOLESTEROL 90 tablet 1  . levothyroxine (SYNTHROID, LEVOTHROID) 100 MCG tablet TAKE 1 TABLET (100 MCG TOTAL) BY MOUTH DAILY BEFORE BREAKFAST. 90 tablet 1  . Magnesium 400 MG CAPS Take 400 mg by mouth. Takes 4 per day    . metFORMIN (  GLUCOPHAGE-XR) 500 MG 24 hr tablet TAKE 1 TABLET WITH BREAKFAST AND LUNCH & 2 TABLETS AT SUPPER FOR DIABETES 360 tablet 0  . Omega-3 Fatty Acids (FISH OIL PO) Take 2,000 mg by mouth daily.    Marland Kitchen omeprazole (PRILOSEC) 40 MG capsule TAKE ONE CAPSULE BY MOUTH TWICE A DAY 180 capsule 0  . Potassium 75 MG TABS Take by mouth daily.    . Vitamin D, Ergocalciferol, (DRISDOL) 50000 UNITS CAPS capsule Take 1 capsule (50,000 Units total) by  mouth 3 (three) times a week. 30 capsule 3  . FLUoxetine (PROZAC) 20 MG capsule Take 1 capsule daily for mood 90 capsule 2   No current facility-administered medications on file prior to visit.    Medical History:  Past Medical History:  Diagnosis Date  . Adenomatous colon polyp 02/15/2009  . Bradycardia   . CAD (coronary artery disease)    remote MI in 2004 with PCI; s/p CABG x 3 03/2012  . Family history of malignant neoplasm of gastrointestinal tract   . GERD (gastroesophageal reflux disease)   . Hyperlipidemia   . Hypertension   . Myocardial infarction ([place])    2004  . Obesity   . Prediabetes   . 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 Tobacco abuse 03/28/2012  . Vitamin D deficiency    Allergies:  Allergies  Allergen Reactions  . Lisinopril Cough     Review of Systems:  Review of Systems  Constitutional: Negative for chills and fever.  HENT: Negative for congestion, ear discharge and sore throat.   Eyes: Negative.   Respiratory: Negative for cough, shortness of breath and wheezing.   Cardiovascular: Negative for chest pain, palpitations and leg swelling.  Gastrointestinal: Negative for blood in stool, constipation, diarrhea, heartburn and melena.  Genitourinary: Negative.   Musculoskeletal: Positive for joint pain. Negative for falls and neck pain.  Neurological: Negative for dizziness, sensory change, loss of consciousness and headaches.  Psychiatric/Behavioral: Negative for depression. The patient is not nervous/anxious and does not have insomnia.     Family history- Review and unchanged  Social history- Review and unchanged  Physical Exam: BP 132/76   Pulse 89   Temp 97.2 F (36.2 C)   Resp 16   Ht 6' 2.25" (1.886 m)   Wt 270 lb 12.8 oz (122.8 kg)   SpO2 96%   BMI 34.53 kg/m  Wt Readings from Last 3 Encounters:  05/21/17 270 lb 12.8 oz (122.8 kg)  01/19/17 285 lb 3.2 oz (129.4 kg)  10/13/16 280 lb  (127 kg)   General Appearance: Well nourished well developed, non-toxic appearing, in no apparent distress. Eyes: PERRLA, EOMs, conjunctiva no swelling or erythema ENT/Mouth: Ear canals clear with no erythema, swelling, or discharge.  TMs normal bilaterally, oropharynx clear, moist, with no exudate.   Neck: Supple, thyroid normal, no JVD, no cervical adenopathy.  Respiratory: Respiratory effort normal, breath sounds clear A&P, no wheeze, rhonchi or rales noted.  No retractions, no accessory muscle usage Cardio: RRR with no MRGs. No noted edema.  Abdomen: Soft, + BS.  Non tender, no guarding, rebound, hernias, masses. Musculoskeletal: Full ROM, 5/5 strength, Normal gait,Shoulder right: Full range of motion. Neurovascularly intact distally. Good strength with stress of rotator cuff but causes pain, + pain with 90 degree abudction. Positive impingement signs. Skin: Warm, dry without rashes, lesions, ecchymosis.  Neuro: Awake and oriented X 3, Cranial nerves  intact. No cerebellar symptoms.  Psych: normal affect, Insight and Judgment appropriate.    Vicie Mutters, PA-C 12:07 PM [facility]

## 2017-05-21 ENCOUNTER — Ambulatory Visit (INDEPENDENT_AMBULATORY_CARE_PROVIDER_SITE_OTHER): Payer: 59 | Admitting: Physician Assistant

## 2017-05-21 ENCOUNTER — Encounter: Payer: Self-pay | Admitting: Physician Assistant

## 2017-05-21 VITALS — BP 132/76 | HR 89 | Temp 97.2°F | Resp 16 | Ht 74.25 in | Wt 270.8 lb

## 2017-05-21 DIAGNOSIS — Z720 Tobacco use: Secondary | ICD-10-CM

## 2017-05-21 DIAGNOSIS — I25708 Atherosclerosis of coronary artery bypass graft(s), unspecified, with other forms of angina pectoris: Secondary | ICD-10-CM | POA: Diagnosis not present

## 2017-05-21 DIAGNOSIS — E1122 Type 2 diabetes mellitus with diabetic chronic kidney disease: Secondary | ICD-10-CM | POA: Diagnosis not present

## 2017-05-21 DIAGNOSIS — Z79899 Other long term (current) drug therapy: Secondary | ICD-10-CM | POA: Diagnosis not present

## 2017-05-21 DIAGNOSIS — I10 Essential (primary) hypertension: Secondary | ICD-10-CM | POA: Diagnosis not present

## 2017-05-21 DIAGNOSIS — E559 Vitamin D deficiency, unspecified: Secondary | ICD-10-CM

## 2017-05-21 DIAGNOSIS — E782 Mixed hyperlipidemia: Secondary | ICD-10-CM | POA: Diagnosis not present

## 2017-05-21 DIAGNOSIS — N182 Chronic kidney disease, stage 2 (mild): Secondary | ICD-10-CM | POA: Diagnosis not present

## 2017-05-21 LAB — CBC WITH DIFFERENTIAL/PLATELET
Basophils Absolute: 60 cells/uL (ref 0–200)
Basophils Relative: 1 %
Eosinophils Absolute: 180 cells/uL (ref 15–500)
Eosinophils Relative: 3 %
HCT: 45.2 % (ref 38.5–50.0)
Hemoglobin: 15.2 g/dL (ref 13.2–17.1)
Lymphocytes Relative: 18 %
Lymphs Abs: 1080 cells/uL (ref 850–3900)
MCH: 29.2 pg (ref 27.0–33.0)
MCHC: 33.6 g/dL (ref 32.0–36.0)
MCV: 86.8 fL (ref 80.0–100.0)
MPV: 11.1 fL (ref 7.5–12.5)
Monocytes Absolute: 480 cells/uL (ref 200–950)
Monocytes Relative: 8 %
Neutro Abs: 4200 cells/uL (ref 1500–7800)
Neutrophils Relative %: 70 %
Platelets: 266 10*3/uL (ref 140–400)
RBC: 5.21 MIL/uL (ref 4.20–5.80)
RDW: 14.2 % (ref 11.0–15.0)
WBC: 6 10*3/uL (ref 3.8–10.8)

## 2017-05-21 MED ORDER — LOSARTAN POTASSIUM 100 MG PO TABS: ORAL_TABLET | ORAL | 2 refills | Status: DC

## 2017-05-21 NOTE — Patient Instructions (Addendum)
Increase the prozac to 40mg , do this for 1 month, if this does not help you we will switch you to wellbutrin for depression, motivation. Call and let me know   Biceps Tendon Tendinitis (Proximal) and Tenosynovitis The proximal biceps tendon is a strong cord of tissue that connects the biceps muscle, on the front of the upper arm, to the shoulder blade. Tendinitis is inflammation of a tendon. Tenosynovitis is inflammation of the lining around the tendon (tendon sheath). These conditions often occur at the same time, and they can interfere with the ability to bend the elbow and turn the hand palm-up (supination). Proximal biceps tendon tendinitis and tenosynovitis are usually caused by overusing the shoulder joint and the biceps muscle. These conditions usually heal within 6 weeks. Proximal biceps tendon tendinitis may include a grade 1 or grade 2 strain of the tendon. A grade 1 strain is mild, and it involves a slight pull of the tendon without any stretching or noticeable tearing of the tendon. There is usually no loss of biceps muscle strength. A grade 2 strain is moderate, and it involves a small tear in the tendon. The tendon is stretched, and biceps strength is usually decreased. What are the causes? This condition may be caused by:  A sudden increase in frequency or intensity of activity that involves the shoulder and the biceps muscle.  Overuse of the biceps muscle. This can happen when you do the same movements over and over, such as: ? Supination. ? Forceful straightening (hyperextension) of the elbow. ? Bending the elbow.  A direct, forceful hit or injury (trauma) to the elbow. This is rare.  What increases the risk? The following factors may make you more likely to develop this condition:  Playing contact sports.  Playing sports that involve throwing and overhead movements, including racket sports, gymnastics, weight lifting, or bodybuilding.  Doing physical labor.  Having poor  strength and flexibility of the arm and shoulder.  What are the signs or symptoms? Symptoms of this condition may include:  Pain and inflammation in the front of the shoulder. Pain may get worse with movement, especially when you use resistance, as in weight lifting.  A feeling of warmth in the front of the shoulder.  Limited range of motion of the shoulder and the elbow.  A crackling sound (crepitation) when you move or touch the shoulder or the upper arm.  In some cases, symptoms may return (recur) after treatment, and they may be long-lasting (chronic). How is this diagnosed? This condition is diagnosed based on your symptoms, your medical history, and a physical exam. You may have tests, including X-rays or MRIs. Your health care provider may test your range of motion by asking you to do arm movements. How is this treated? This condition is treated by resting and icing the injured area, and by doing physical therapy exercises. Depending on the severity of your condition, treatment may also include:  Medicines to help relieve pain and inflammation.  Ultrasound therapy. This is the application of sound waves to the injured area.  Injecting medicines (corticosteroids) into your tendon sheath.  Injecting medicines that numb the area (local anesthetics).  Surgery to remove the damaged part of the tendon and reattach the undamaged part of the tendon to the arm bone (humerus). This is usually only done if you have symptoms that do not get better with other treatment methods.  Follow these instructions at home: Managing pain, stiffness, and swelling  If directed, put ice on the  injured area: ? Put ice in a plastic bag. ? Place a towel between your skin and the bag. ? Leave the ice on for 20 minutes, 2-3 times a day.  Move your fingers often to avoid stiffness and to lessen swelling.  Raise (elevate) the injured area above the level of your heart while you are sitting or lying  down.  If directed, apply heat to the affected area before you exercise. Use the heat source that your health care provider recommends, such as a moist heat pack or a heating pad. ? Place a towel between your skin and the heat source. ? Leave the heat on for 20-30 minutes. ? Remove the heat if your skin turns bright red. This is especially important if you are unable to feel pain, heat, or cold. You may have a greater risk of getting burned. Activity  Return to your normal activities as told by your health care provider. Ask your health care provider what activities are safe for you.  Do not lift anything that is heavier than 10 lb (4.5 kg) until your health care provider tells you that it is safe.  Avoid activities that cause pain or make your condition worse.  Do exercises as told by your health care provider. General instructions  Take over-the-counter and prescription medicines only as told by your health care provider.  Do not drive or operate heavy machinery while taking prescription pain medicines.  Keep all follow-up visits as told by your health care provider. This is important. How is this prevented?  Warm up and stretch before being active.  Cool down and stretch after being active.  Give your body time to rest between periods of activity.  Make sure any equipment that you use is fitted to you.  Be safe and responsible while being active to avoid falls.  Do at least 150 minutes of moderate-intensity aerobic exercise each week, such as brisk walking or water aerobics.  Maintain physical fitness, including: ? Strength. ? Flexibility. ? Cardiovascular fitness. ? Endurance. Contact a health care provider if:  You have symptoms that get worse or do not get better after 2 weeks of treatment.  You develop new symptoms. Get help right away if:  You develop severe pain. This information is not intended to replace advice given to you by your health care provider.  Make sure you discuss any questions you have with your health care provider. Document Released: 12/04/2005 Document Revised: 08/10/2016 Document Reviewed: 11/12/2015 Elsevier Interactive Patient Education  2018 [employer].   Biceps Tendon Tendinitis (Proximal) and Tenosynovitis Rehab Ask your health care provider which exercises are safe for you. Do exercises exactly as told by your health care provider and adjust them as directed. It is normal to feel mild stretching, pulling, tightness, or discomfort as you do these exercises, but you should stop right away if you feel sudden pain or your pain gets worse.Do not begin these exercises until told by your health care provider. Stretching and range of motion exercises These exercises warm up your muscles and joints and improve the movement and flexibility of your arm and shoulder. These exercises also help to relieve pain and stiffness. Exercise A: Shoulder flexion  1. Stand facing a wall. Put your left / right hand on the wall. 2. Slide your left / right hand up the wall. Stop when you feel a stretch in your shoulder, or when you reach the angle that is recommended by your health care provider. ? Use your  other hand to help raise your arm, if needed. ? As your hand gets higher, you may need to step closer to the wall. ? Avoid shrugging your shoulder while you raise your arm. To do this, keep your shoulder blade tucked down toward your spine. 3. Hold for __________ seconds. 4. Slowly return to the starting position. Use your other arm to help, if needed. Repeat __________ times. Complete this exercise __________ times a day. Exercise B: Posterior capsule stretch ( passive horizontal adduction) 1. Sit or stand and pull your left / right elbow across your chest, toward your other shoulder. Stop when you feel a gentle stretch in the back of your shoulder and upper arm. ? Keep your arm at shoulder height. ? Keep your arm as close to your body as  you comfortably can. 2. Hold for __________ seconds. 3. Slowly return to the starting position. Repeat __________ times. Complete this exercise __________ times a day. Strengthening exercises These exercises build strength and endurance in your arm and shoulder. Endurance is the ability to use your muscles for a long time, even after your muscles get tired. Exercise C: Elbow flexion, supinated  1. Sit on a stable chair without armrests, or stand. 2. If directed, hold a __________ weight in your left / right hand, or hold an exercise band with both hands. Your palms should face up toward the ceiling at the starting position. 3. Bend your left / right elbow and move your hand up toward your shoulder. Keep your other arm straight down, in the starting position. 4. Slowly return to the starting position. Repeat __________ times. Complete this exercise __________ times a day. Exercise D: Scapular protraction, supine  1. Lie on your back on a firm surface. If directed, hold a __________ weight in your left / right hand. 2. Raise your left / right arm straight into the air so your hand is directly above your shoulder joint. 3. Push the weight into the air so your shoulder lifts off of the surface that you are lying on. Do not move your head, neck, or back. 4. Hold for __________ seconds. 5. Slowly return to the starting position. Let your muscles relax completely before you repeat this exercise. Repeat __________ times. Complete this exercise __________ times a day. Exercise E: Scapular retraction  1. Sit in a stable chair without armrests, or stand. 2. Secure an exercise band to a stable object in front of you so the band is at shoulder height. 3. Hold one end of the exercise band in each hand. 4. Squeeze your shoulder blades together and move your elbows slightly behind you. Do not shrug your shoulders. 5. Hold for __________ seconds. 6. Slowly return to the starting position. Repeat  __________ times. Complete this exercise __________ times a day. This information is not intended to replace advice given to you by your health care provider. Make sure you discuss any questions you have with your health care provider. Document Released: 12/04/2005 Document Revised: 08/10/2016 Document Reviewed: 11/12/2015 Elsevier Interactive Patient Education  2018 [place].   Shoulder Impingement Syndrome Rehab Ask your health care provider which exercises are safe for you. Do exercises exactly as told by your health care provider and adjust them as directed. It is normal to feel mild stretching, pulling, tightness, or discomfort as you do these exercises, but you should stop right away if you feel sudden pain or your pain gets worse.Do not begin these exercises until told by your health care provider. Stretching  and range of motion exercise This exercise warms up your muscles and joints and improves the movement and flexibility of your shoulder. This exercise also helps to relieve pain and stiffness. Exercise A: Passive horizontal adduction  5. Sit or stand and pull your left / right elbow across your chest, toward your other shoulder. Stop when you feel a gentle stretch in the back of your shoulder and upper arm. ? Keep your arm at shoulder height. ? Keep your arm as close to your body as you comfortably can. 6. Hold for __________ seconds. 7. Slowly return to the starting position. Repeat __________ times. Complete this exercise __________ times a day. Strengthening exercises These exercises build strength and endurance in your shoulder. Endurance is the ability to use your muscles for a long time, even after they get tired. Exercise B: External rotation, isometric 4. Stand or sit in a doorway, facing the door frame. 5. Bend your left / right elbow and place the back of your wrist against the door frame. Only your wrist should be touching the frame. Keep your upper arm at your  side. 6. Gently press your wrist against the door frame, as if you are trying to push your arm away from your abdomen. ? Avoid shrugging your shoulder while you press your hand against the door frame. Keep your shoulder blade tucked down toward the middle of your back. 7. Hold for __________ seconds. 8. Slowly release the tension, and relax your muscles completely before you do the exercise again. Repeat __________ times. Complete this exercise __________ times a day. Exercise C: Internal rotation, isometric  1. Stand or sit in a doorway, facing the door frame. 2. Bend your left / right elbow and place the inside of your wrist against the door frame. Only your wrist should be touching the frame. Keep your upper arm at your side. 3. Gently press your wrist against the door frame, as if you are trying to push your arm toward your abdomen. ? Avoid shrugging your shoulder while you press your hand against the door frame. Keep your shoulder blade tucked down toward the middle of your back. 4. Hold for __________ seconds. 5. Slowly release the tension, and relax your muscles completely before you do the exercise again. Repeat __________ times. Complete this exercise __________ times a day. Exercise D: Scapular protraction, supine  6. Lie on your back on a firm surface. Hold a __________ weight in your left / right hand. 7. Raise your left / right arm straight into the air so your hand is directly above your shoulder joint. 8. Push the weight into the air so your shoulder lifts off of the surface that you are lying on. Do not move your head, neck, or back. 9. Hold for __________ seconds. 10. Slowly return to the starting position. Let your muscles relax completely before you repeat this exercise. Repeat __________ times. Complete this exercise __________ times a day. Exercise E: Scapular retraction  7. Sit in a stable chair without armrests, or stand. 8. Secure an exercise band to a stable object  in front of you so the band is at shoulder height. 9. Hold one end of the exercise band in each hand. Your palms should face down. 10. Squeeze your shoulder blades together and move your elbows slightly behind you. Do not shrug your shoulders while you do this. 11. Hold for __________ seconds. 12. Slowly return to the starting position. Repeat __________ times. Complete this exercise __________ times a day. Exercise  F: Shoulder extension  1. Sit in a stable chair without armrests, or stand. 2. Secure an exercise band to a stable object in front of you where the band is above shoulder height. 3. Hold one end of the exercise band in each hand. 4. Straighten your elbows and lift your hands up to shoulder height. 5. Squeeze your shoulder blades together and pull your hands down to the sides of your thighs. Stop when your hands are straight down by your sides. Do not let your hands go behind your body. 6. Hold for __________ seconds. 7. Slowly return to the starting position. Repeat __________ times. Complete this exercise __________ times a day. This information is not intended to replace advice given to you by your health care provider. Make sure you discuss any questions you have with your health care provider. Document Released: 12/04/2005 Document Revised: 08/10/2016 Document Reviewed: 11/06/2015 Elsevier Interactive Patient Education  [employer].

## 2017-05-22 LAB — HEPATIC FUNCTION PANEL
ALT: 11 U/L (ref 9–46)
AST: 12 U/L (ref 10–35)
Albumin: 4.1 g/dL (ref 3.6–5.1)
Alkaline Phosphatase: 96 U/L (ref 40–115)
Bilirubin, Direct: 0.1 mg/dL (ref <=0.2)
Indirect Bilirubin: 0.6 mg/dL (ref 0.2–1.2)
Total Bilirubin: 0.7 mg/dL (ref 0.2–1.2)
Total Protein: 6.6 g/dL (ref 6.1–8.1)

## 2017-05-22 LAB — LIPID PANEL
Cholesterol: 212 mg/dL — ABNORMAL HIGH (ref <200)
HDL: 51 mg/dL (ref >40)
LDL Cholesterol: 125 mg/dL — ABNORMAL HIGH (ref <100)
Total CHOL/HDL Ratio: 4.2 Ratio (ref <5.0)
Triglycerides: 179 mg/dL — ABNORMAL HIGH (ref <150)
VLDL: 36 mg/dL — ABNORMAL HIGH (ref <30)

## 2017-05-22 LAB — HEMOGLOBIN A1C
Hgb A1c MFr Bld: 6.3 % — ABNORMAL HIGH (ref <5.7)
Mean Plasma Glucose: 134 mg/dL

## 2017-05-22 LAB — BASIC METABOLIC PANEL WITH GFR
BUN: 11 mg/dL (ref 7–25)
CO2: 24 mmol/L (ref 20–31)
Calcium: 9.2 mg/dL (ref 8.6–10.3)
Chloride: 100 mmol/L (ref 98–110)
Creat: 1.13 mg/dL (ref 0.70–1.25)
GFR, Est African American: 80 mL/min (ref >=60)
GFR, Est Non African American: 69 mL/min (ref >=60)
Glucose, Bld: 115 mg/dL — ABNORMAL HIGH (ref 65–99)
Potassium: 4.9 mmol/L (ref 3.5–5.3)
Sodium: 134 mmol/L — ABNORMAL LOW (ref 135–146)

## 2017-05-22 LAB — MAGNESIUM: Magnesium: 2 mg/dL (ref 1.5–2.5)

## 2017-05-22 LAB — TSH: TSH: 2.16 mIU/L (ref 0.40–4.50)

## 2017-06-11 ENCOUNTER — Ambulatory Visit (AMBULATORY_SURGERY_CENTER): Payer: Self-pay | Admitting: *Deleted

## 2017-06-11 VITALS — Ht 74.5 in | Wt 270.2 lb

## 2017-06-11 DIAGNOSIS — Z86010 Personal history of colonic polyps: Secondary | ICD-10-CM

## 2017-06-11 MED ORDER — NA SULFATE-K SULFATE-MG SULF 17.5-3.13-1.6 GM/177ML PO SOLN: 1.0000 | Freq: Once | ORAL | 0 refills | Status: AC

## 2017-06-11 NOTE — Progress Notes (Signed)
Denies allergies to eggs or soy products. Denies complications with sedation or anesthesia. Denies O2 use. Denies use of diet or weight loss medications.  Emmi instructions given for colonoscopy.  

## 2017-06-12 ENCOUNTER — Encounter: Payer: Self-pay | Admitting: Gastroenterology

## 2017-06-25 ENCOUNTER — Ambulatory Visit (AMBULATORY_SURGERY_CENTER): Payer: 59 | Admitting: Gastroenterology

## 2017-06-25 ENCOUNTER — Encounter: Payer: Self-pay | Admitting: Gastroenterology

## 2017-06-25 VITALS — BP 124/60 | HR 53 | Temp 97.5°F | Resp 18 | Ht 74.25 in | Wt 270.0 lb

## 2017-06-25 DIAGNOSIS — Z800 Family history of malignant neoplasm of digestive organs: Secondary | ICD-10-CM | POA: Diagnosis not present

## 2017-06-25 DIAGNOSIS — Z860100 Personal history of colon polyps, unspecified: Secondary | ICD-10-CM

## 2017-06-25 DIAGNOSIS — D120 Benign neoplasm of cecum: Secondary | ICD-10-CM

## 2017-06-25 DIAGNOSIS — D124 Benign neoplasm of descending colon: Secondary | ICD-10-CM | POA: Diagnosis not present

## 2017-06-25 DIAGNOSIS — D123 Benign neoplasm of transverse colon: Secondary | ICD-10-CM | POA: Diagnosis not present

## 2017-06-25 MED ORDER — SODIUM CHLORIDE 0.9 % IV SOLN: 500.0000 mL | INTRAVENOUS | Status: DC

## 2017-06-25 NOTE — Progress Notes (Signed)
A/ox3 pleased with MAC, report to Celia RN 

## 2017-06-25 NOTE — Progress Notes (Signed)
Pt. Reports  No change in his medical or surgical history since pre-visit 06/11/2017.

## 2017-06-25 NOTE — Op Note (Signed)
[place] Patient Name: Keeyon Privitera Procedure Date: 06/25/2017 11:28 AM MRN: [id number] Endoscopist: Remo Lipps P. Shondrea Steinert MD, MD Age: 66 Referring MD:  Date of Birth: 04/06/1951 Gender: Male Account #: [id number] Procedure:                Colonoscopy Indications:              Screening patient at increased risk: father with                            colorectal cancer at age > 9 years, High risk                            colon cancer surveillance: Personal history of                            colonic polyps Medicines:                Monitored Anesthesia Care Procedure:                Pre-Anesthesia Assessment:                           - Prior to the procedure, a History and Physical                            was performed, and patient medications and                            allergies were reviewed. The patient's tolerance of                            previous anesthesia was also reviewed. The risks                            and benefits of the procedure and the sedation                            options and risks were discussed with the patient.                            All questions were answered, and informed consent                            was obtained. Prior Anticoagulants: The patient has                            taken no previous anticoagulant or antiplatelet                            agents. ASA Grade Assessment: II - A patient with                            mild systemic disease. After reviewing the risks  and benefits, the patient was deemed in                            satisfactory condition to undergo the procedure.                           After obtaining informed consent, the colonoscope                            was passed under direct vision. Throughout the                            procedure, the patient's blood pressure, pulse, and                            oxygen saturations were monitored continuously.  The                            Colonoscope was introduced through the anus and                            advanced to the the cecum, identified by                            appendiceal orifice and ileocecal valve. The                            colonoscopy was performed without difficulty. The                            patient tolerated the procedure well. The quality                            of the bowel preparation was good. The ileocecal                            valve, appendiceal orifice, and rectum were                            photographed. Scope In: 11:39:41 AM Scope Out: 94:70:96 PM Scope Withdrawal Time: 0 hours 19 minutes 6 seconds  Total Procedure Duration: 0 hours 22 minutes 0 seconds  Findings:                 The perianal exam findings include skin tags.                           Two sessile polyps were found in the cecum. The                            polyps were 4 mm in size. These polyps were removed                            with a cold snare. Resection and retrieval were  complete.                           Five sessile polyps were found in the transverse                            colon. The polyps were 4 to 7 mm in size. These                            polyps were removed with a cold snare. Resection                            and retrieval were complete.                           A 3 mm polyp was found in the descending colon. The                            polyp was sessile. The polyp was removed with a                            cold snare. Resection and retrieval were complete.                           Scattered medium-mouthed diverticula were found in                            the entire colon.                           Internal hemorrhoids were found during retroflexion.                           The exam was otherwise without abnormality. Complications:            No immediate complications. Estimated blood loss:                             Minimal. Estimated Blood Loss:     Estimated blood loss was minimal. Impression:               - Perianal skin tags found on perianal exam.                           - Two 4 mm polyps in the cecum, removed with a cold                            snare. Resected and retrieved.                           - Five 4 to 7 mm polyps in the transverse colon,                            removed with a cold snare. Resected and retrieved.                           -  One 3 mm polyp in the descending colon, removed                            with a cold snare. Resected and retrieved.                           - Diverticulosis in the entire examined colon.                           - Internal hemorrhoids.                           - The examination was otherwise normal. Recommendation:           - Patient has a contact number available for                            emergencies. The signs and symptoms of potential                            delayed complications were discussed with the                            patient. Return to normal activities tomorrow.                            Written discharge instructions were provided to the                            patient.                           - Resume previous diet.                           - Continue present medications.                           - Await pathology results.                           - Repeat colonoscopy is recommended for                            surveillance. The colonoscopy date will be                            determined after pathology results from today's                            exam become available for review.                           - No ibuprofen, naproxen, or other non-steroidal                            anti-inflammatory drugs  for 2 weeks after polyp                            removal. Remo Lipps P. Witney Huie MD, MD 06/25/2017 12:07:19 PM This report has been signed electronically.

## 2017-06-25 NOTE — Progress Notes (Signed)
Called to room to assist during endoscopic procedure.  Patient ID and intended procedure confirmed with present staff. Received instructions for my participation in the procedure from the performing physician.  

## 2017-06-25 NOTE — Patient Instructions (Addendum)
YOU HAD AN ENDOSCOPIC PROCEDURE TODAY AT [facility]:   Refer to the procedure report that was given to you for any specific questions about what was found during the examination.  If the procedure report does not answer your questions, please call your gastroenterologist to clarify.  If you requested that your care partner not be given the details of your procedure findings, then the procedure report has been included in a sealed envelope for you to review at your convenience later.  YOU SHOULD EXPECT: Some feelings of bloating in the abdomen. Passage of more gas than usual.  Walking can help get rid of the air that was put into your GI tract during the procedure and reduce the bloating. If you had a lower endoscopy (such as a colonoscopy or flexible sigmoidoscopy) you may notice spotting of blood in your stool or on the toilet paper. If you underwent a bowel prep for your procedure, you may not have a normal bowel movement for a few days.  Please Note:  You might notice some irritation and congestion in your nose or some drainage.  This is from the oxygen used during your procedure.  There is no need for concern and it should clear up in a day or so.  SYMPTOMS TO REPORT IMMEDIATELY:   Following lower endoscopy (colonoscopy or flexible sigmoidoscopy):  Excessive amounts of blood in the stool  Significant tenderness or worsening of abdominal pains  Swelling of the abdomen that is new, acute  Fever of 100F or higher   For urgent or emergent issues, a gastroenterologist can be reached at any hour by calling [phone].   DIET:  We do recommend a small meal at first, but then you may proceed to your regular diet.  Drink plenty of fluids but you should avoid alcoholic beverages for 24 hours.  Try to increase the fiber in your diet, and drink plenty of water.  ACTIVITY:  You should plan to take it easy for the rest of today and you should NOT DRIVE or use heavy machinery until  tomorrow (because of the sedation medicines used during the test).    FOLLOW UP: Our staff will call the number listed on your records the next business day following your procedure to check on you and address any questions or concerns that you may have regarding the information given to you following your procedure. If we do not reach you, we will leave a message.  However, if you are feeling well and you are not experiencing any problems, there is no need to return our call.  We will assume that you have returned to your regular daily activities without incident.  If any biopsies were taken you will be contacted by phone or by letter within the next 1-3 weeks.  Please call us at [phone] if you have not heard about the biopsies in 3 weeks.    SIGNATURES/CONFIDENTIALITY: You and/or your care partner have signed paperwork which will be entered into your electronic medical record.  These signatures attest to the fact that that the information above on your After Visit Summary has been reviewed and is understood.  Full responsibility of the confidentiality of this discharge information lies with you and/or your care-partner.  Read all of the handouts given to you by your recovery room nurse.  No ibuprofen, aleve nor aspirin for two weeks per Dr. Loni Muse.

## 2017-06-26 ENCOUNTER — Telehealth: Payer: Self-pay

## 2017-06-26 NOTE — Telephone Encounter (Signed)
  Follow up Call-  Call back number 06/25/2017  Post procedure Call Back phone  # [phone]  Permission to leave phone message Yes  Some recent data might be hidden     Patient questions:  Do you have a fever, pain , or abdominal swelling? No. Pain Score  0 *  Have you tolerated food without any problems? Yes.    Have you been able to return to your normal activities? Yes.    Do you have any questions about your discharge instructions: Diet   No. Medications  No. Follow up visit  No.  Do you have questions or concerns about your Care? No.  Actions: * If pain score is 4 or above: No action needed, pain <4.

## 2017-07-02 ENCOUNTER — Encounter: Payer: Self-pay | Admitting: Gastroenterology

## 2017-07-13 ENCOUNTER — Encounter: Payer: Self-pay | Admitting: Physician Assistant

## 2017-07-15 MED ORDER — FLUOXETINE HCL 40 MG PO CAPS: ORAL_CAPSULE | ORAL | 1 refills | Status: DC

## 2017-07-15 MED ORDER — ATENOLOL 50 MG PO TABS: 50.0000 mg | ORAL_TABLET | Freq: Every day | ORAL | 1 refills | Status: DC

## 2017-07-29 ENCOUNTER — Other Ambulatory Visit: Payer: Self-pay | Admitting: Internal Medicine

## 2017-08-07 ENCOUNTER — Encounter: Payer: Self-pay | Admitting: Internal Medicine

## 2017-08-07 ENCOUNTER — Other Ambulatory Visit: Payer: Self-pay | Admitting: Internal Medicine

## 2017-08-07 DIAGNOSIS — K219 Gastro-esophageal reflux disease without esophagitis: Secondary | ICD-10-CM

## 2017-08-07 MED ORDER — OMEPRAZOLE 40 MG PO CPDR: DELAYED_RELEASE_CAPSULE | ORAL | 1 refills | Status: DC

## 2017-08-13 ENCOUNTER — Ambulatory Visit (INDEPENDENT_AMBULATORY_CARE_PROVIDER_SITE_OTHER): Payer: 59 | Admitting: Internal Medicine

## 2017-08-13 ENCOUNTER — Encounter: Payer: Self-pay | Admitting: Internal Medicine

## 2017-08-13 VITALS — BP 140/76 | HR 60 | Temp 98.2°F | Resp 14 | Ht 74.5 in | Wt 270.0 lb

## 2017-08-13 DIAGNOSIS — Z6835 Body mass index (BMI) 35.0-35.9, adult: Secondary | ICD-10-CM

## 2017-08-13 DIAGNOSIS — Z951 Presence of aortocoronary bypass graft: Secondary | ICD-10-CM

## 2017-08-13 DIAGNOSIS — E782 Mixed hyperlipidemia: Secondary | ICD-10-CM

## 2017-08-13 DIAGNOSIS — I10 Essential (primary) hypertension: Secondary | ICD-10-CM | POA: Diagnosis not present

## 2017-08-13 DIAGNOSIS — Z0000 Encounter for general adult medical examination without abnormal findings: Secondary | ICD-10-CM

## 2017-08-13 DIAGNOSIS — Z125 Encounter for screening for malignant neoplasm of prostate: Secondary | ICD-10-CM

## 2017-08-13 DIAGNOSIS — Z79899 Other long term (current) drug therapy: Secondary | ICD-10-CM

## 2017-08-13 DIAGNOSIS — E039 Hypothyroidism, unspecified: Secondary | ICD-10-CM

## 2017-08-13 DIAGNOSIS — E559 Vitamin D deficiency, unspecified: Secondary | ICD-10-CM

## 2017-08-13 DIAGNOSIS — K219 Gastro-esophageal reflux disease without esophagitis: Secondary | ICD-10-CM

## 2017-08-13 DIAGNOSIS — Z0001 Encounter for general adult medical examination with abnormal findings: Secondary | ICD-10-CM

## 2017-08-13 DIAGNOSIS — N182 Chronic kidney disease, stage 2 (mild): Secondary | ICD-10-CM

## 2017-08-13 DIAGNOSIS — I25708 Atherosclerosis of coronary artery bypass graft(s), unspecified, with other forms of angina pectoris: Secondary | ICD-10-CM

## 2017-08-13 DIAGNOSIS — Z111 Encounter for screening for respiratory tuberculosis: Secondary | ICD-10-CM

## 2017-08-13 DIAGNOSIS — Z136 Encounter for screening for cardiovascular disorders: Secondary | ICD-10-CM | POA: Diagnosis not present

## 2017-08-13 DIAGNOSIS — Z1212 Encounter for screening for malignant neoplasm of rectum: Secondary | ICD-10-CM

## 2017-08-13 DIAGNOSIS — E1122 Type 2 diabetes mellitus with diabetic chronic kidney disease: Secondary | ICD-10-CM

## 2017-08-13 DIAGNOSIS — R5383 Other fatigue: Secondary | ICD-10-CM

## 2017-08-13 DIAGNOSIS — R632 Polyphagia: Secondary | ICD-10-CM

## 2017-08-13 NOTE — Patient Instructions (Signed)

## 2017-08-13 NOTE — Progress Notes (Signed)
[facility]   Unk Pinto, M.D.      Uvaldo Bristle. Silverio Lay, P.A.-C [facility]                [address] [place] [address] Telephone [phone] Telefax [phone] Annual  Screening/Preventative Visit  & Comprehensive Evaluation & Examination     This very nice 66 y.o.  presents for a Screening/Preventative Visit & comprehensive evaluation and management of multiple medical co-morbidities.  Patient has been followed for HTN, T2_NIDDM  W/CKD2, Hyperlipidemia, Hypothyroidism and Vitamin D Deficiency.     HTN predates s circa 2005 when he presented with ACS and had PCA/Stenting. Then in 2013 , again with ACS , he underwent CABG (age 21 yo). Patient's BP has been controlled at home.  Today's BP is at goal - 140/76. Patient denies any cardiac symptoms as chest pain, palpitations, shortness of breath, dizziness or ankle swelling.     Patient's hyperlipidemia is not controlled with diet and Zetia (Intolerant to Statins). Patient denies myalgias or other medication SE's. Last lipids were  Lab Results  Component Value Date   CHOL 212 (H) 05/21/2017   HDL 51 05/21/2017   LDLCALC 125 (H) 05/21/2017   TRIG 179 (H) 05/21/2017   CHOLHDL 4.2 05/21/2017      Patient has Gluttony with compulsive Overeating and Morbid Obesity (BMI 34+)  and PreDM with A1c 6.2% in 2011 and then A1c 6.6% in 2016 dx'd with T2_DM. He also has associated CKD2 and patient denies reactive hypoglycemic symptoms, visual blurring, diabetic polys or paresthesias. Patient has a fatalistic attitude toward life feeling that he will die with a few years and refuses to check his blood sugars.  Last A1c was not at goal: Lab Results  Component Value Date   HGBA1C 6.3 (H) 05/21/2017       Patient was dx'd Hypothyroid in 2015 and has since been on Thyroid replacement. Finally, patient has history of Vitamin D Deficiency ("6" in 2008) and last  vitamin D was at goal: Lab Results  Component Value Date   VD25OH 69 01/19/2017   Current Outpatient Prescriptions on File Prior to Visit  Medication Sig  . atenolol (TENORMIN) 50 MG tablet Take 1 tablet (50 mg total) by mouth daily.  . Cyanocobalamin (VITAMIN B-12 PO) Take 1,000 mcg by mouth daily.   Marland Kitchen ezetimibe (ZETIA) 10 MG tablet TAKE 1 TABLET BY MOUTH EVERY DAY FOR CHOLESTEROL  . FLUoxetine (PROZAC) 40 MG capsule TAKE 1 CAPSULE DAILY FOR MOOD  . IRON PO Take 65 mg by mouth.  . levothyroxine (SYNTHROID, LEVOTHROID) 100 MCG tablet TAKE 1 TABLET (100 MCG TOTAL) BY MOUTH DAILY BEFORE BREAKFAST.  Marland Kitchen losartan (COZAAR) 100 MG tablet TAKE 1/2 TO 1 TABLET DAILY FOR BLOOD PRESSURE  . Magnesium 400 MG CAPS Take 400 mg by mouth. Takes 4 per day  . metFORMIN (GLUCOPHAGE-XR) 500 MG 24 hr tablet TAKE 1 TABLET WITH BREAKFAST AND LUNCH & 2 TABLETS AT SUPPER FOR DIABETES  . Omega-3 Fatty Acids (FISH OIL PO) Take 2,000 mg by mouth daily.  Marland Kitchen omeprazole (PRILOSEC) 40 MG capsule Take 1 capsule daily for acid Indigestion & Reflux  . Potassium 75 MG TABS Take by mouth daily.  . Vitamin D, Ergocalciferol, (DRISDOL) 50000 UNITS CAPS capsule Take 1 capsule (50,000 Units total) by mouth 3 (three) times a week.   No current  facility-administered medications on file prior to visit.    Allergies  Allergen Reactions  . Lisinopril Cough   Past Medical History:  Diagnosis Date  . Adenomatous colon polyp 02/15/2009  . Bradycardia   . CAD (coronary artery disease)    remote MI in 2004 with PCI; s/p CABG x 3 03/2012  . Depression   . Family history of malignant neoplasm of gastrointestinal tract   . GERD (gastroesophageal reflux disease)   . Hyperlipidemia   . Hypertension   . Hypothyroidism   . Myocardial infarction ([place])    2004  . Obesity   . Prediabetes   . 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 Tobacco abuse 03/28/2012  . Vitamin D deficiency     Health Maintenance  Topic Date Due  . OPHTHALMOLOGY EXAM  12/04/1961  . FOOT EXAM  07/10/2017  . INFLUENZA VACCINE  07/18/2017  . HEMOGLOBIN A1C  11/20/2017  . TETANUS/TDAP  01/05/2019  . COLONOSCOPY  06/25/2020  . PNEUMOCOCCAL POLYSACCHARIDE VACCINE  Completed  . Hepatitis C Screening  Completed  . HIV Screening  Completed   Immunization History  Administered Date(s) Administered  . PPD Test 06/15/2014, 07/10/2016  . Pneumococcal Polysaccharide-23 01/05/2009, 08/10/2014  . Td 01/05/2009  . Zoster 01/03/2016   Past Surgical History:  Procedure Laterality Date  . BACK SURGERY    . COLONOSCOPY  02/15/2009  . CORONARY ANGIOPLASTY WITH STENT PLACEMENT     about 8 years ago at [facility]  . CORONARY ARTERY BYPASS GRAFT  04/02/2012   Procedure: CORONARY ARTERY BYPASS GRAFTING (CABG);  Surgeon: Rexene Alberts, MD;  Location: [facility];  Service: Open Heart Surgery;  Laterality: N/A;  On pump, times three graphs using endoscopically harvested right greater saphenous vein and left internal mammary artery.   Marland Kitchen KNEE SURGERY    . TONSILLECTOMY     Family History  Problem Relation Age of Onset  . Hypertension Mother   . Alzheimer's disease Mother   . Colon cancer Father   . Colon cancer Paternal Grandmother   . Esophageal cancer Neg Hx   . Rectal cancer Neg Hx   . Stomach cancer Neg Hx    Social History   Social History  . Marital status: Divorced    Spouse name: N/A  . Number of children: 0  . Years of education: N/A   Occupational History  . Disability    Social History Main Topics  . Smoking status: Current Every Day Smoker    Packs/day: 2.00    Years: 40.00    Types: Cigarettes  . Smokeless tobacco: Never Used     Comment: down from 2ppd  . Alcohol use No     Comment: Rarely  . Drug use: No  . Sexual activity: Not Currently   Other Topics Concern  . Not on file   Social History Narrative  . No narrative on file    ROS Constitutional: Denies fever,  chills, weight loss/gain, headaches, insomnia,  night sweats or change in appetite. Does c/o fatigue. Eyes: Denies redness, blurred vision, diplopia, discharge, itchy or watery eyes.  ENT: Denies discharge, congestion, post nasal drip, epistaxis, sore throat, earache, hearing loss, dental pain, Tinnitus, Vertigo, Sinus pain or snoring.  Cardio: Denies chest pain, palpitations, irregular heartbeat, syncope, dyspnea, diaphoresis, orthopnea, PND, claudication or edema Respiratory: denies cough, dyspnea, DOE, pleurisy, hoarseness, laryngitis or wheezing.  Gastrointestinal: Denies  dysphagia, heartburn, reflux, water brash, pain, cramps, nausea, vomiting, bloating, diarrhea, constipation, hematemesis, melena, hematochezia, jaundice or hemorrhoids Genitourinary: Denies dysuria, frequency, urgency, nocturia, hesitancy, discharge, hematuria or flank pain Musculoskeletal: Denies arthralgia, myalgia, stiffness, Jt. Swelling, pain, limp or strain/sprain. Denies Falls. Skin: Denies puritis, rash, hives, warts, acne, eczema or change in skin lesion Neuro: No weakness, tremor, incoordination, spasms, paresthesia or pain Psychiatric: Denies confusion, memory loss or sensory loss. Denies Depression. Endocrine: Denies change in weight, skin, hair change, nocturia, and paresthesia, diabetic polys, visual blurring or hyper / hypo glycemic episodes.  Heme/Lymph: No excessive bleeding, bruising or enlarged lymph nodes.  Physical Exam  BP 140/76   Pulse 60   Temp 98.2 F (36.8 C)   Resp 14   Ht 6' 2.5" (1.892 m)   Wt 270 lb (122.5 kg)   SpO2 96%   BMI 34.20 kg/m   General Appearance: Over nourished and in no apparent distress.  Eyes: PERRLA, EOMs, conjunctiva no swelling or erythema, normal fundi and vessels. Sinuses: No frontal/maxillary tenderness ENT/Mouth: EACs patent / TMs  nl. Nares clear without erythema, swelling, mucoid exudates. Oral hygiene is poor with blackish staining of carious teeth. No  erythema, swelling, or exudate. Tongue normal, non-obstructing. Tonsils not swollen or erythematous. Hearing normal.  Neck: Supple, thyroid normal. No bruits, nodes or JVD. Respiratory: Respiratory effort normal.  BS equal and clear bilateral without rales, rhonci, wheezing or stridor. Cardio: Heart sounds are normal with regular rate and rhythm and no murmurs, rubs or gallops. Peripheral pulses are normal and equal bilaterally without edema. No aortic or femoral bruits. Chest: symmetric with normal excursions and percussion.  Abdomen: Soft, Rotund with Nl bowel sounds. Nontender, no guarding, rebound, hernias, masses, or organomegaly.  Lymphatics: Non tender without lymphadenopathy.  Genitourinary:  DRE - deferred to Colonoscopy done last month. Musculoskeletal: Full ROM all peripheral extremities, joint stability, 5/5 strength, and normal gait. Skin: Warm and dry without rashes, lesions, cyanosis, clubbing or  ecchymosis.  Neuro: Cranial nerves intact, reflexes equal bilaterally. Normal muscle tone, no cerebellar symptoms. Sensation intact to touch, vibratory and Monofilament to the toes bilaterally. Pysch: Alert and oriented X 3 with normal affect, insight and judgment appropriate.   Assessment and Plan  1. Annual Preventative/Screening Exam   2. Essential hypertension  - EKG 12-Lead - Microalbumin / creatinine urine ratio - CBC with Differential/Platelet - BASIC METABOLIC PANEL WITH GFR - Magnesium - TSH  3. Hyperlipidemia, mixed  - EKG 12-Lead - Hepatic function panel - Lipid panel - TSH  4. Type 2 diabetes mellitus with stage 2 chronic kidney disease, without long-term current use of insulin (HCC)  - EKG 12-Lead - HM DIABETES FOOT EXAM - LOW EXTREMITY NEUR EXAM DOCUM - Hemoglobin A1c - Insulin, fasting  5. Vitamin D deficiency  - VITAMIN D 25 Hydroxy   6. 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 S/P CABG x 3  - EKG 12-Lead  8. Hypothyroidism  - TSH  9. Gastroesophageal reflux disease  10. Gluttony  11. Class 2 severe obesity due to excess calories with serious comorbidity and body mass index (BMI) of 35.0 to 35.9 in adult ([place])   12. Screening for rectal cancer   13. Prostate cancer screening  - PSA  14. Screening for ischemic heart disease  - EKG 12-Lead  15. Screening for AAA (aortic abdominal aneurysm) - patient declined U/S Ao  16. Screening  examination for pulmonary tuberculosis  17. Fatigue  - Vitamin B12 - Iron,Total/Total Iron Binding Cap - Testosterone  18. Medication management  - Microalbumin / creatinine urine ratio - CBC with Differential/Platelet - BASIC METABOLIC PANEL WITH GFR - Hepatic function panel - Magnesium - Lipid panel - TSH - Hemoglobin A1c - Insulin, fasting - VITAMIN D 25 Hydroxy         Patient was counseled in prudent diet, weight control to achieve/maintain BMI less than 25, BP monitoring, regular exercise and medications as discussed.  Discussed med effects and SE's. Routine screening labs and tests as requested with regular follow-up as recommended. Over 40 minutes of exam, counseling, chart review and high complex critical decision making was performed

## 2017-08-16 LAB — VITAMIN D 25 HYDROXY (VIT D DEFICIENCY, FRACTURES): Vit D, 25-Hydroxy: 89 ng/mL (ref 30–100)

## 2017-08-16 LAB — HEPATIC FUNCTION PANEL
AG Ratio: 1.9 (calc) (ref 1.0–2.5)
ALT: 9 U/L (ref 9–46)
AST: 10 U/L (ref 10–35)
Albumin: 4.2 g/dL (ref 3.6–5.1)
Alkaline phosphatase (APISO): 96 U/L (ref 40–115)
Bilirubin, Direct: 0.1 mg/dL (ref 0.0–0.2)
Globulin: 2.2 g/dL (calc) (ref 1.9–3.7)
Indirect Bilirubin: 0.4 mg/dL (calc) (ref 0.2–1.2)
Total Bilirubin: 0.5 mg/dL (ref 0.2–1.2)
Total Protein: 6.4 g/dL (ref 6.1–8.1)

## 2017-08-16 LAB — LIPID PANEL
Cholesterol: 198 mg/dL (ref <200)
HDL: 47 mg/dL (ref >40)
LDL Cholesterol (Calc): 123 mg/dL (calc) — ABNORMAL HIGH
Non-HDL Cholesterol (Calc): 151 mg/dL (calc) — ABNORMAL HIGH (ref <130)
Total CHOL/HDL Ratio: 4.2 (calc) (ref <5.0)
Triglycerides: 164 mg/dL — ABNORMAL HIGH (ref <150)

## 2017-08-16 LAB — CBC WITH DIFFERENTIAL/PLATELET
Basophils Absolute: 51 cells/uL (ref 0–200)
Basophils Relative: 0.8 %
Eosinophils Absolute: 173 cells/uL (ref 15–500)
Eosinophils Relative: 2.7 %
HCT: 44.7 % (ref 38.5–50.0)
Hemoglobin: 15.2 g/dL (ref 13.2–17.1)
Lymphs Abs: 979 cells/uL (ref 850–3900)
MCH: 29 pg (ref 27.0–33.0)
MCHC: 34 g/dL (ref 32.0–36.0)
MCV: 85.3 fL (ref 80.0–100.0)
MPV: 11.4 fL (ref 7.5–12.5)
Monocytes Relative: 7.3 %
Neutro Abs: 4730 cells/uL (ref 1500–7800)
Neutrophils Relative %: 73.9 %
Platelets: 278 10*3/uL (ref 140–400)
RBC: 5.24 10*6/uL (ref 4.20–5.80)
RDW: 13.4 % (ref 11.0–15.0)
Total Lymphocyte: 15.3 %
WBC mixed population: 467 cells/uL (ref 200–950)
WBC: 6.4 10*3/uL (ref 3.8–10.8)

## 2017-08-16 LAB — HEMOGLOBIN A1C
Hgb A1c MFr Bld: 6 % of total Hgb — ABNORMAL HIGH (ref <5.7)
Mean Plasma Glucose: 126 (calc)
eAG (mmol/L): 7 (calc)

## 2017-08-16 LAB — VITAMIN B12: Vitamin B-12: 1181 pg/mL — ABNORMAL HIGH (ref 200–1100)

## 2017-08-16 LAB — PSA: PSA: 0.7 ng/mL (ref <=4.0)

## 2017-08-16 LAB — BASIC METABOLIC PANEL WITH GFR
BUN: 9 mg/dL (ref 7–25)
CO2: 28 mmol/L (ref 20–32)
Calcium: 9.4 mg/dL (ref 8.6–10.3)
Chloride: 97 mmol/L — ABNORMAL LOW (ref 98–110)
Creat: 1.2 mg/dL (ref 0.70–1.25)
GFR, Est African American: 74 mL/min/{1.73_m2} (ref >=60)
GFR, Est Non African American: 64 mL/min/{1.73_m2} (ref >=60)
Glucose, Bld: 117 mg/dL — ABNORMAL HIGH (ref 65–99)
Potassium: 5.2 mmol/L (ref 3.5–5.3)
Sodium: 135 mmol/L (ref 135–146)

## 2017-08-16 LAB — IRON, TOTAL/TOTAL IRON BINDING CAP
%SAT: 30 % (calc) (ref 15–60)
Iron: 111 ug/dL (ref 50–180)
TIBC: 369 mcg/dL (calc) (ref 250–425)

## 2017-08-16 LAB — MICROALBUMIN / CREATININE URINE RATIO
Creatinine, Urine: 202 mg/dL (ref 20–370)
Microalb Creat Ratio: 8 mcg/mg creat (ref <30)
Microalb, Ur: 1.7 mg/dL

## 2017-08-16 LAB — MAGNESIUM: Magnesium: 1.9 mg/dL (ref 1.5–2.5)

## 2017-08-16 LAB — TSH: TSH: 1.56 mIU/L (ref 0.40–4.50)

## 2017-08-16 LAB — INSULIN, FASTING: Insulin: 6.3 u[IU]/mL (ref 2.0–19.6)

## 2017-08-16 LAB — TESTOSTERONE: Testosterone: 369 ng/dL (ref 250–827)

## 2017-09-06 ENCOUNTER — Other Ambulatory Visit: Payer: Self-pay | Admitting: Internal Medicine

## 2017-10-23 ENCOUNTER — Other Ambulatory Visit: Payer: Self-pay

## 2017-10-23 ENCOUNTER — Encounter (HOSPITAL_COMMUNITY): Payer: Self-pay

## 2017-10-23 ENCOUNTER — Emergency Department (HOSPITAL_COMMUNITY): Payer: 59

## 2017-10-23 ENCOUNTER — Inpatient Hospital Stay (HOSPITAL_COMMUNITY)
Admission: EM | Admit: 2017-10-23 | Discharge: 2017-10-26 | DRG: 247 | Disposition: A | Discharge status: Home / Self Care | Payer: 59 | Attending: Interventional Cardiology | Admitting: Interventional Cardiology

## 2017-10-23 ENCOUNTER — Encounter (HOSPITAL_COMMUNITY)
Admission: EM | Disposition: A | Discharge status: Home / Self Care | Payer: Self-pay | Source: Home / Self Care | Attending: Interventional Cardiology

## 2017-10-23 DIAGNOSIS — Z951 Presence of aortocoronary bypass graft: Secondary | ICD-10-CM

## 2017-10-23 DIAGNOSIS — Z800 Family history of malignant neoplasm of digestive organs: Secondary | ICD-10-CM

## 2017-10-23 DIAGNOSIS — I25720 Atherosclerosis of autologous artery coronary artery bypass graft(s) with unstable angina pectoris: Secondary | ICD-10-CM | POA: Diagnosis present

## 2017-10-23 DIAGNOSIS — Z7982 Long term (current) use of aspirin: Secondary | ICD-10-CM

## 2017-10-23 DIAGNOSIS — E669 Obesity, unspecified: Secondary | ICD-10-CM | POA: Diagnosis present

## 2017-10-23 DIAGNOSIS — I472 Ventricular tachycardia: Secondary | ICD-10-CM | POA: Diagnosis not present

## 2017-10-23 DIAGNOSIS — Z7984 Long term (current) use of oral hypoglycemic drugs: Secondary | ICD-10-CM

## 2017-10-23 DIAGNOSIS — I340 Nonrheumatic mitral (valve) insufficiency: Secondary | ICD-10-CM | POA: Diagnosis present

## 2017-10-23 DIAGNOSIS — K219 Gastro-esophageal reflux disease without esophagitis: Secondary | ICD-10-CM | POA: Diagnosis present

## 2017-10-23 DIAGNOSIS — I25110 Atherosclerotic heart disease of native coronary artery with unstable angina pectoris: Secondary | ICD-10-CM | POA: Diagnosis present

## 2017-10-23 DIAGNOSIS — E1122 Type 2 diabetes mellitus with diabetic chronic kidney disease: Secondary | ICD-10-CM | POA: Diagnosis present

## 2017-10-23 DIAGNOSIS — I252 Old myocardial infarction: Secondary | ICD-10-CM

## 2017-10-23 DIAGNOSIS — F17210 Nicotine dependence, cigarettes, uncomplicated: Secondary | ICD-10-CM | POA: Diagnosis present

## 2017-10-23 DIAGNOSIS — I25700 Atherosclerosis of coronary artery bypass graft(s), unspecified, with unstable angina pectoris: Secondary | ICD-10-CM

## 2017-10-23 DIAGNOSIS — I200 Unstable angina: Secondary | ICD-10-CM

## 2017-10-23 DIAGNOSIS — I213 ST elevation (STEMI) myocardial infarction of unspecified site: Secondary | ICD-10-CM

## 2017-10-23 DIAGNOSIS — Z955 Presence of coronary angioplasty implant and graft: Secondary | ICD-10-CM

## 2017-10-23 DIAGNOSIS — Z79890 Hormone replacement therapy (postmenopausal): Secondary | ICD-10-CM

## 2017-10-23 DIAGNOSIS — E871 Hypo-osmolality and hyponatremia: Secondary | ICD-10-CM | POA: Diagnosis not present

## 2017-10-23 DIAGNOSIS — I25118 Atherosclerotic heart disease of native coronary artery with other forms of angina pectoris: Secondary | ICD-10-CM

## 2017-10-23 DIAGNOSIS — I10 Essential (primary) hypertension: Secondary | ICD-10-CM | POA: Diagnosis present

## 2017-10-23 DIAGNOSIS — Z8249 Family history of ischemic heart disease and other diseases of the circulatory system: Secondary | ICD-10-CM

## 2017-10-23 DIAGNOSIS — I2119 ST elevation (STEMI) myocardial infarction involving other coronary artery of inferior wall: Principal | ICD-10-CM | POA: Diagnosis present

## 2017-10-23 DIAGNOSIS — Z888 Allergy status to other drugs, medicaments and biological substances status: Secondary | ICD-10-CM

## 2017-10-23 DIAGNOSIS — Z66 Do not resuscitate: Secondary | ICD-10-CM | POA: Diagnosis not present

## 2017-10-23 DIAGNOSIS — E039 Hypothyroidism, unspecified: Secondary | ICD-10-CM | POA: Diagnosis present

## 2017-10-23 DIAGNOSIS — R079 Chest pain, unspecified: Secondary | ICD-10-CM | POA: Diagnosis not present

## 2017-10-23 DIAGNOSIS — E785 Hyperlipidemia, unspecified: Secondary | ICD-10-CM | POA: Diagnosis present

## 2017-10-23 DIAGNOSIS — Z6836 Body mass index (BMI) 36.0-36.9, adult: Secondary | ICD-10-CM

## 2017-10-23 HISTORY — PX: LEFT HEART CATH AND CORS/GRAFTS ANGIOGRAPHY: CATH118250

## 2017-10-23 HISTORY — PX: CORONARY/GRAFT ACUTE MI REVASCULARIZATION: CATH118305

## 2017-10-23 LAB — BASIC METABOLIC PANEL
Anion gap: 10 (ref 5–15)
BUN: 10 mg/dL (ref 6–20)
CO2: 24 mmol/L (ref 22–32)
Calcium: 8.7 mg/dL — ABNORMAL LOW (ref 8.9–10.3)
Chloride: 96 mmol/L — ABNORMAL LOW (ref 101–111)
Creatinine, Ser: 1.37 mg/dL — ABNORMAL HIGH (ref 0.61–1.24)
GFR calc Af Amer: >60 mL/min (ref >60)
GFR calc non Af Amer: 53 mL/min — ABNORMAL LOW (ref >60)
Glucose, Bld: 194 mg/dL — ABNORMAL HIGH (ref 65–99)
Potassium: 3.4 mmol/L — ABNORMAL LOW (ref 3.5–5.1)
Sodium: 130 mmol/L — ABNORMAL LOW (ref 135–145)

## 2017-10-23 LAB — CBC
HCT: 41.1 % (ref 39.0–52.0)
Hemoglobin: 14.2 g/dL (ref 13.0–17.0)
MCH: 28.9 pg (ref 26.0–34.0)
MCHC: 34.5 g/dL (ref 30.0–36.0)
MCV: 83.5 fL (ref 78.0–100.0)
Platelets: 219 10*3/uL (ref 150–400)
RBC: 4.92 MIL/uL (ref 4.22–5.81)
RDW: 13.9 % (ref 11.5–15.5)
WBC: 7.2 10*3/uL (ref 4.0–10.5)

## 2017-10-23 LAB — I-STAT TROPONIN, ED: Troponin i, poc: 0.02 ng/mL (ref 0.00–0.08)

## 2017-10-23 SURGERY — CORONARY/GRAFT ACUTE MI REVASCULARIZATION
Anesthesia: LOCAL

## 2017-10-23 MED ORDER — HEPARIN (PORCINE) IN NACL 100-0.45 UNIT/ML-% IJ SOLN
1400.0000 [IU]/h | INTRAMUSCULAR | Status: DC
  Administered 2017-10-23: 1400 [IU]/h via INTRAVENOUS
  Filled 2017-10-23: qty 250

## 2017-10-23 MED ORDER — HYDROMORPHONE HCL 1 MG/ML IJ SOLN
1.0000 mg | Freq: Once | INTRAMUSCULAR | Status: AC
  Administered 2017-10-23: 1 mg via INTRAVENOUS
  Filled 2017-10-23: qty 1

## 2017-10-23 MED ORDER — ASPIRIN 81 MG PO CHEW
CHEWABLE_TABLET | ORAL | Status: AC
  Administered 2017-10-23: 324 mg
  Filled 2017-10-23: qty 4

## 2017-10-23 MED ORDER — HEPARIN BOLUS VIA INFUSION
4000.0000 [IU] | Freq: Once | INTRAVENOUS | Status: AC
  Administered 2017-10-23: 4000 [IU] via INTRAVENOUS
  Filled 2017-10-23: qty 4000

## 2017-10-23 SURGICAL SUPPLY — 28 items
BALLN SAPPHIRE 2.5X12 (BALLOONS) ×2
BALLN SAPPHIRE 3.0X15 (BALLOONS) ×2
BALLN ~~LOC~~ EMERGE MR 3.75X15 (BALLOONS) ×2
BALLOON SAPPHIRE 2.5X12 (BALLOONS) ×1 IMPLANT
BALLOON SAPPHIRE 3.0X15 (BALLOONS) ×1 IMPLANT
BALLOON ~~LOC~~ EMERGE MR 3.75X15 (BALLOONS) ×1 IMPLANT
CATH 5FR JL3.5 JR4 ANG PIG MP (CATHETERS) ×2 IMPLANT
CATH EXTRAC PRONTO 5.5F 138CM (CATHETERS) ×2 IMPLANT
CATH INFINITI 5 FR RCB (CATHETERS) ×2 IMPLANT
CATH INFINITI 5FR JL4 (CATHETERS) ×2 IMPLANT
CATH LAUNCHER 6FR EBU3.5 (CATHETERS) ×2 IMPLANT
COVER PRB 48X5XTLSCP FOLD TPE (BAG) ×1 IMPLANT
COVER PROBE 5X48 (BAG) ×1
GLIDESHEATH SLEND SS 6F .021 (SHEATH) IMPLANT
GUIDEWIRE INQWIRE 1.5J.035X260 (WIRE) IMPLANT
HOVERMATT SINGLE USE (MISCELLANEOUS) ×2 IMPLANT
INQWIRE 1.5J .035X260CM (WIRE)
KIT ENCORE 26 ADVANTAGE (KITS) ×4 IMPLANT
KIT HEART LEFT (KITS) ×2 IMPLANT
PACK CARDIAC CATHETERIZATION (CUSTOM PROCEDURE TRAY) ×2 IMPLANT
SHEATH PINNACLE 6F 10CM (SHEATH) ×2 IMPLANT
STENT SYNERGY DES 3.5X20 (Permanent Stent) ×2 IMPLANT
TRANSDUCER W/STOPCOCK (MISCELLANEOUS) ×2 IMPLANT
TUBING CIL FLEX 10 FLL-RA (TUBING) ×2 IMPLANT
VALVE GUARDIAN II ~~LOC~~ HEMO (MISCELLANEOUS) ×2 IMPLANT
WIRE ASAHI PROWATER 180CM (WIRE) ×2 IMPLANT
WIRE EMERALD 3MM-J .035X150CM (WIRE) ×2 IMPLANT
WIRE HI TORQ BMW 190CM (WIRE) ×2 IMPLANT

## 2017-10-23 NOTE — Progress Notes (Signed)
ANTICOAGULATION CONSULT NOTE - Initial Consult  Pharmacy Consult for Heparin  Indication: chest pain/ACS  Allergies  Allergen Reactions  . Lisinopril Cough   Patient Measurements: Height: 6\' 3"  (190.5 cm) Weight: 275 lb (124.7 kg) IBW/kg (Calculated) : 84.5  Vital Signs: BP: 124/76 (11/06 2245) Pulse Rate: 54 (11/06 2245)  Labs: Recent Labs    10/23/17 2048  HGB 14.2  HCT 41.1  PLT 219  CREATININE 1.37*    Estimated Creatinine Clearance: 78.5 mL/min (A) (by C-G formula based on SCr of 1.37 mg/dL (H)).   Medical History: Past Medical History:  Diagnosis Date  . Adenomatous colon polyp 02/15/2009  . Bradycardia   . CAD (coronary artery disease)    remote MI in 2004 with PCI; s/p CABG x 3 03/2012  . Depression   . Family history of malignant neoplasm of gastrointestinal tract   . GERD (gastroesophageal reflux disease)   . Hyperlipidemia   . Hypertension   . Hypothyroidism   . Myocardial infarction ([place])    2004  . Obesity   . Prediabetes   . 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 Tobacco abuse 03/28/2012  . Vitamin D deficiency     Assessment: 66 y/o M with hx CABG here with CP, starting heparin for r/o ACS, CBC good, mild bump in Scr, PTA meds reviewed.   Goal of Therapy:  Heparin level 0.3-0.7 units/ml Monitor platelets by anticoagulation protocol: Yes   Plan:  Heparin 4000 units BOLUS Start heparin drip at 1400 units/hr 0800 HL Daily CBC/HL Monitor for bleeding   Narda Bonds 10/23/2017,11:02 PM

## 2017-10-23 NOTE — ED Notes (Signed)
Pt now in bigeminy, Chelsea RN shot EKG and gave it to Dr Wilson Singer.

## 2017-10-23 NOTE — ED Triage Notes (Signed)
Pt arrives via EMS with complaints of sudden onset central chest pain that began at ~7:30 pm while at work. EMS reports pt was extremely diaphoretic when they arrived. Pt denies sob, nausea, dizziness. Hx of CABG. Pt received 324mg  ASA and 1 sl NTG pta with no relief of symptoms.

## 2017-10-23 NOTE — ED Notes (Addendum)
Dr. Johna Sheriff ( cardiologist ) at bedside .

## 2017-10-23 NOTE — ED Provider Notes (Signed)
[facility] EMERGENCY DEPARTMENT Provider Note   CSN: [id number] Arrival date & time: 10/23/17  2045     History   Chief Complaint Chief Complaint  Patient presents with  . Chest Pain    HPI CARTRELL BENTSEN is a 66 y.o. male.  HPI   66 year old male with chest pain.  Onset around 730 while at work.  No strenuous activity when it started.  Apparently patient was extremely diaphoretic when EMS arrived.  Symptoms have since improved.  He denies any symptoms currently.  He did receive 324 mg of aspirin prior to arrival.  He does have a history of CAD status post CABG.  Past Medical History:  Diagnosis Date  . Adenomatous colon polyp 02/15/2009  . Bradycardia   . CAD (coronary artery disease)    remote MI in 2004 with PCI; s/p CABG x 3 03/2012  . Depression   . Family history of malignant neoplasm of gastrointestinal tract   . GERD (gastroesophageal reflux disease)   . Hyperlipidemia   . Hypertension   . Hypothyroidism   . Myocardial infarction ([place])    2004  . Obesity   . Prediabetes   . 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 Tobacco abuse 03/28/2012  . Vitamin D deficiency     Patient Active Problem List   Diagnosis Date Noted  . History of MI (myocardial infarction) 05/20/2017  . Rectus sheath hematoma 08/08/2014  . Medication management 03/30/2014  . Coronary atherosclerosis 12/25/2013  . 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 GERD (gastroesophageal reflux disease)   . Vitamin D deficiency   . Obesity   . 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 Hypertension 03/28/2012  . Tobacco abuse 03/28/2012  . Hyperlipidemia 03/28/2012  . COLONIC POLYPS, ADENOMATOUS, HX OF 02/19/2009    Past Surgical History:  Procedure Laterality Date  . BACK SURGERY    . COLONOSCOPY  02/15/2009  . CORONARY ANGIOPLASTY WITH STENT PLACEMENT     about 8 years ago at [facility]  . KNEE SURGERY    . TONSILLECTOMY          Home Medications    Prior to Admission medications   Medication Sig Start Date End Date Taking? Authorizing Provider  aspirin EC 325 MG tablet Take 325 mg daily by mouth.   Yes [provider]  atenolol (TENORMIN) 50 MG tablet Take 1 tablet (50 mg total) by mouth daily. 07/15/17  Yes Vicie Mutters, PA-C  Cyanocobalamin (VITAMIN B-12 PO) Take 1,000 mcg by mouth daily.    Yes [provider]  ezetimibe (ZETIA) 10 MG tablet TAKE 1 TABLET BY MOUTH EVERY DAY FOR CHOLESTEROL Patient taking differently: Take 10 mg daily by mouth. FOR CHOLESTEROL 07/10/16  Yes Unk Pinto, MD  FLUoxetine (PROZAC) 40 MG capsule TAKE 1 CAPSULE DAILY FOR MOOD Patient taking differently: Take 40 mg daily by mouth. FOR MOOD 07/15/17  Yes Vicie Mutters, PA-C  IRON PO Take 65 mg (of elemental iron) every other day   Yes [provider]  levothyroxine (SYNTHROID, LEVOTHROID) 100 MCG tablet TAKE 1 TABLET (100 MCG TOTAL) BY MOUTH DAILY BEFORE BREAKFAST. Patient taking differently: Take 100 mcg by mouth once a day before breakfast 09/06/17  Yes Unk Pinto, MD  losartan (COZAAR) 100 MG tablet TAKE 1/2 TO 1 TABLET DAILY FOR BLOOD PRESSURE Patient taking differently: Take 100 mg  daily by mouth. FOR BLOOD PRESSURE 05/21/17  Yes Vicie Mutters, PA-C  Magnesium 400 MG CAPS Take 400 mg 4 (four) times daily by mouth.    Yes [provider]  metFORMIN (GLUCOPHAGE-XR) 500 MG 24 hr tablet TAKE 1 TABLET WITH BREAKFAST AND LUNCH & 2 TABLETS AT SUPPER FOR DIABETES Patient taking differently: Take 1,000 mg by mouth two times a day 07/29/17  Yes Unk Pinto, MD  Omega-3 Fatty Acids (FISH OIL PO) Take 2,000 mg by mouth daily.   Yes [provider]  omeprazole (PRILOSEC) 40 MG capsule Take 1 capsule daily for acid Indigestion & Reflux Patient taking differently: Take 40 mg daily by mouth.  08/07/17 03/07/18 Yes Unk Pinto, MD  Potassium 75 MG TABS Take 75 mg daily by mouth.     Yes [provider]  Vitamin D, Ergocalciferol, (DRISDOL) 50000 UNITS CAPS capsule Take 1 capsule (50,000 Units total) by mouth 3 (three) times a week. Patient taking differently: Take 50,000 Units every Monday, Wednesday, and Friday by mouth.  09/23/15  Yes Forcucci, Courtney, PA-C    Family History Family History  Problem Relation Age of Onset  . Hypertension Mother   . Alzheimer's disease Mother   . Colon cancer Father   . Colon cancer Paternal Grandmother   . Esophageal cancer Neg Hx   . Rectal cancer Neg Hx   . Stomach cancer Neg Hx     Social History Social History   Tobacco Use  . Smoking status: Current Every Day Smoker    Packs/day: 2.00    Years: 40.00    Pack years: 80.00    Types: Cigarettes  . Smokeless tobacco: Never Used  . Tobacco comment: down from 2ppd  Substance Use Topics  . Alcohol use: No    Alcohol/week: 1.2 oz    Types: 1 Glasses of wine, 1 Shots of liquor per week    Comment: Rarely  . Drug use: No     Allergies   Lisinopril   Review of Systems Review of Systems  All systems reviewed and negative, other than as noted in HPI.  Physical Exam Updated Vital Signs BP 124/76   Pulse (!) 54   Resp 17   Ht 6\' 3"  (1.905 m)   Wt 124.7 kg (275 lb)   SpO2 100%   BMI 34.37 kg/m   Physical Exam  Constitutional: He appears well-developed and well-nourished. No distress.  HENT:  Head: Normocephalic and atraumatic.  Eyes: Conjunctivae are normal. Right eye exhibits no discharge. Left eye exhibits no discharge.  Neck: Neck supple.  Cardiovascular: Normal rate, regular rhythm and normal heart sounds. Exam reveals no gallop and no friction rub.  No murmur heard. Pulmonary/Chest: Effort normal and breath sounds normal. No respiratory distress.  Abdominal: Soft. He exhibits no distension. There is no tenderness.  Musculoskeletal: He exhibits no edema or tenderness.  Neurological: He is alert.  Skin: Skin is warm and dry.   Psychiatric: He has a normal mood and affect. His behavior is normal. Thought content normal.  Nursing note and vitals reviewed.    ED Treatments / Results  Labs (all labs ordered are listed, but only abnormal results are displayed) Labs Reviewed  BASIC METABOLIC PANEL - Abnormal; Notable for the following components:      Result Value   Sodium 130 (*)    Potassium 3.4 (*)    Chloride 96 (*)    Glucose, Bld 194 (*)    Creatinine, Ser 1.37 (*)  Calcium 8.7 (*)    GFR calc non Af Amer 53 (*)    All other components within normal limits  CBC  I-STAT TROPONIN, ED    EKG  EKG Interpretation  Date/Time:  Tuesday October 23 2017 23:48:25 EST Ventricular Rate:  68 PR Interval:    QRS Duration: 130 QT Interval:  397 QTC Calculation: 423 R Axis:   65 Text Interpretation:  Sinus rhythm Multiform ventricular premature complexes Probable left atrial enlargement Nonspecific intraventricular conduction delay Inferior infarct, acute (RCA) Probable RV involvement, suggest recording right precordial leads No significant change was found Confirmed by Shanon Rosser [phone]) on 10/24/2017 1:26:54 PM       Radiology Dg Chest 2 View  Result Date: 10/23/2017 CLINICAL DATA:  Profuse sweating earlier today with weakness; Hx of CABG X 1, cardiac stent placed, Hx of MI, smoker, diabetic EXAM: CHEST  2 VIEW COMPARISON:  08/08/2014 FINDINGS: Status post median sternotomy. The heart is normal in size. There are no focal consolidations or pleural effusions. There is mild streaky atelectasis in the lung bases. No pulmonary edema. IMPRESSION: 1. No pulmonary edema. 2. Mild bibasilar atelectasis. Electronically Signed   By: Nolon Nations M.D.   On: 10/23/2017 21:45    Procedures Procedures (including critical care time)  CRITICAL CARE Performed by: Virgel Manifold Total critical care time: 35 minutes Critical care time was exclusive of separately billable procedures and treating other  patients. Critical care was necessary to treat or prevent imminent or life-threatening deterioration. Critical care was time spent personally by me on the following activities: development of treatment plan with patient and/or surrogate as well as nursing, discussions with consultants, evaluation of patient's response to treatment, examination of patient, obtaining history from patient or surrogate, ordering and performing treatments and interventions, ordering and review of laboratory studies, ordering and review of radiographic studies, pulse oximetry and re-evaluation of patient's condition.   Medications Ordered in ED Medications  HYDROmorphone (DILAUDID) injection 1 mg (1 mg Intravenous Given 10/23/17 2239)     Initial Impression / Assessment and Plan / ED Course  I have reviewed the triage vital signs and the nursing notes.  Pertinent labs & imaging results that were available during my care of the patient were reviewed by me and considered in my medical decision making (see chart for details).     Pt with CP again. Appears uncomfortable. Repeat EKG with a little more pronounced inferior STE. Had ASA prior to arrival. Heparin ordered. Will discuss with STEMI doc.   Final Clinical Impressions(s) / ED Diagnoses   Final diagnoses:  ST elevation myocardial infarction (STEMI), unspecified artery ([place])  Unstable angina [facility])    ED Discharge Orders    None       Virgel Manifold, MD 10/30/17 1300

## 2017-10-23 NOTE — ED Notes (Signed)
Code Stemi activated @ 2328

## 2017-10-23 NOTE — ED Notes (Signed)
Pt asked to sit up on the edge of the bed for comfort. Pt now sitting up on the edge of the bed leaning on the head of the bed. Pt is more comfortable now.

## 2017-10-24 ENCOUNTER — Encounter (HOSPITAL_COMMUNITY): Payer: Self-pay

## 2017-10-24 ENCOUNTER — Emergency Department (HOSPITAL_COMMUNITY): Payer: 59

## 2017-10-24 ENCOUNTER — Inpatient Hospital Stay (HOSPITAL_COMMUNITY): Payer: 59

## 2017-10-24 DIAGNOSIS — E669 Obesity, unspecified: Secondary | ICD-10-CM | POA: Diagnosis present

## 2017-10-24 DIAGNOSIS — I25700 Atherosclerosis of coronary artery bypass graft(s), unspecified, with unstable angina pectoris: Secondary | ICD-10-CM | POA: Diagnosis not present

## 2017-10-24 DIAGNOSIS — E785 Hyperlipidemia, unspecified: Secondary | ICD-10-CM

## 2017-10-24 DIAGNOSIS — R079 Chest pain, unspecified: Secondary | ICD-10-CM | POA: Diagnosis present

## 2017-10-24 DIAGNOSIS — Z800 Family history of malignant neoplasm of digestive organs: Secondary | ICD-10-CM | POA: Diagnosis not present

## 2017-10-24 DIAGNOSIS — Z955 Presence of coronary angioplasty implant and graft: Secondary | ICD-10-CM | POA: Diagnosis not present

## 2017-10-24 DIAGNOSIS — F17210 Nicotine dependence, cigarettes, uncomplicated: Secondary | ICD-10-CM | POA: Diagnosis present

## 2017-10-24 DIAGNOSIS — I10 Essential (primary) hypertension: Secondary | ICD-10-CM

## 2017-10-24 DIAGNOSIS — Z7982 Long term (current) use of aspirin: Secondary | ICD-10-CM | POA: Diagnosis not present

## 2017-10-24 DIAGNOSIS — I2510 Atherosclerotic heart disease of native coronary artery without angina pectoris: Secondary | ICD-10-CM | POA: Diagnosis not present

## 2017-10-24 DIAGNOSIS — Z8249 Family history of ischemic heart disease and other diseases of the circulatory system: Secondary | ICD-10-CM | POA: Diagnosis not present

## 2017-10-24 DIAGNOSIS — Z716 Tobacco abuse counseling: Secondary | ICD-10-CM | POA: Diagnosis not present

## 2017-10-24 DIAGNOSIS — E1122 Type 2 diabetes mellitus with diabetic chronic kidney disease: Secondary | ICD-10-CM | POA: Diagnosis present

## 2017-10-24 DIAGNOSIS — Z6836 Body mass index (BMI) 36.0-36.9, adult: Secondary | ICD-10-CM | POA: Diagnosis not present

## 2017-10-24 DIAGNOSIS — I340 Nonrheumatic mitral (valve) insufficiency: Secondary | ICD-10-CM | POA: Diagnosis present

## 2017-10-24 DIAGNOSIS — I25720 Atherosclerosis of autologous artery coronary artery bypass graft(s) with unstable angina pectoris: Secondary | ICD-10-CM | POA: Diagnosis present

## 2017-10-24 DIAGNOSIS — I2121 ST elevation (STEMI) myocardial infarction involving left circumflex coronary artery: Secondary | ICD-10-CM

## 2017-10-24 DIAGNOSIS — Z951 Presence of aortocoronary bypass graft: Secondary | ICD-10-CM | POA: Diagnosis not present

## 2017-10-24 DIAGNOSIS — Z66 Do not resuscitate: Secondary | ICD-10-CM | POA: Diagnosis not present

## 2017-10-24 DIAGNOSIS — E039 Hypothyroidism, unspecified: Secondary | ICD-10-CM | POA: Diagnosis present

## 2017-10-24 DIAGNOSIS — I252 Old myocardial infarction: Secondary | ICD-10-CM | POA: Diagnosis not present

## 2017-10-24 DIAGNOSIS — I2119 ST elevation (STEMI) myocardial infarction involving other coronary artery of inferior wall: Principal | ICD-10-CM

## 2017-10-24 DIAGNOSIS — Z7984 Long term (current) use of oral hypoglycemic drugs: Secondary | ICD-10-CM | POA: Diagnosis not present

## 2017-10-24 DIAGNOSIS — E1159 Type 2 diabetes mellitus with other circulatory complications: Secondary | ICD-10-CM | POA: Diagnosis not present

## 2017-10-24 DIAGNOSIS — I25118 Atherosclerotic heart disease of native coronary artery with other forms of angina pectoris: Secondary | ICD-10-CM

## 2017-10-24 DIAGNOSIS — Z79890 Hormone replacement therapy (postmenopausal): Secondary | ICD-10-CM | POA: Diagnosis not present

## 2017-10-24 DIAGNOSIS — K219 Gastro-esophageal reflux disease without esophagitis: Secondary | ICD-10-CM | POA: Diagnosis present

## 2017-10-24 DIAGNOSIS — Z888 Allergy status to other drugs, medicaments and biological substances status: Secondary | ICD-10-CM | POA: Diagnosis not present

## 2017-10-24 DIAGNOSIS — I472 Ventricular tachycardia: Secondary | ICD-10-CM | POA: Diagnosis not present

## 2017-10-24 DIAGNOSIS — I25110 Atherosclerotic heart disease of native coronary artery with unstable angina pectoris: Secondary | ICD-10-CM | POA: Diagnosis present

## 2017-10-24 DIAGNOSIS — E871 Hypo-osmolality and hyponatremia: Secondary | ICD-10-CM | POA: Diagnosis not present

## 2017-10-24 HISTORY — DX: ST elevation (STEMI) myocardial infarction involving other coronary artery of inferior wall: I21.19

## 2017-10-24 LAB — LIPID PANEL
Cholesterol: 193 mg/dL (ref 0–200)
HDL: 43 mg/dL (ref >40)
LDL Cholesterol: 116 mg/dL — ABNORMAL HIGH (ref 0–99)
Total CHOL/HDL Ratio: 4.5 RATIO
Triglycerides: 171 mg/dL — ABNORMAL HIGH (ref <150)
VLDL: 34 mg/dL (ref 0–40)

## 2017-10-24 LAB — COMPREHENSIVE METABOLIC PANEL
ALT: 15 U/L — ABNORMAL LOW (ref 17–63)
AST: 56 U/L — ABNORMAL HIGH (ref 15–41)
Albumin: 3.2 g/dL — ABNORMAL LOW (ref 3.5–5.0)
Alkaline Phosphatase: 79 U/L (ref 38–126)
Anion gap: 8 (ref 5–15)
BUN: 12 mg/dL (ref 6–20)
CO2: 21 mmol/L — ABNORMAL LOW (ref 22–32)
Calcium: 8.5 mg/dL — ABNORMAL LOW (ref 8.9–10.3)
Chloride: 99 mmol/L — ABNORMAL LOW (ref 101–111)
Creatinine, Ser: 1.12 mg/dL (ref 0.61–1.24)
GFR calc Af Amer: >60 mL/min (ref >60)
GFR calc non Af Amer: >60 mL/min (ref >60)
Glucose, Bld: 120 mg/dL — ABNORMAL HIGH (ref 65–99)
Potassium: 4.4 mmol/L (ref 3.5–5.1)
Sodium: 128 mmol/L — ABNORMAL LOW (ref 135–145)
Total Bilirubin: 0.7 mg/dL (ref 0.3–1.2)
Total Protein: 5.8 g/dL — ABNORMAL LOW (ref 6.5–8.1)

## 2017-10-24 LAB — TROPONIN I
Troponin I: 0.96 ng/mL (ref <0.03)
Troponin I: 10.04 ng/mL (ref <0.03)

## 2017-10-24 LAB — POCT ACTIVATED CLOTTING TIME
Activated Clotting Time: 428 seconds
Activated Clotting Time: 180 seconds
Activated Clotting Time: 164 seconds

## 2017-10-24 LAB — CBC WITH DIFFERENTIAL/PLATELET
Basophils Absolute: 0 10*3/uL (ref 0.0–0.1)
Basophils Relative: 0 %
Eosinophils Absolute: 0 10*3/uL (ref 0.0–0.7)
Eosinophils Relative: 0 %
HCT: 38.6 % — ABNORMAL LOW (ref 39.0–52.0)
Hemoglobin: 13.4 g/dL (ref 13.0–17.0)
Lymphocytes Relative: 11 %
Lymphs Abs: 0.9 10*3/uL (ref 0.7–4.0)
MCH: 28.7 pg (ref 26.0–34.0)
MCHC: 34.7 g/dL (ref 30.0–36.0)
MCV: 82.7 fL (ref 78.0–100.0)
Monocytes Absolute: 0.3 10*3/uL (ref 0.1–1.0)
Monocytes Relative: 3 %
Neutro Abs: 7.2 10*3/uL (ref 1.7–7.7)
Neutrophils Relative %: 86 %
Platelets: 225 10*3/uL (ref 150–400)
RBC: 4.67 MIL/uL (ref 4.22–5.81)
RDW: 14 % (ref 11.5–15.5)
WBC: 8.4 10*3/uL (ref 4.0–10.5)

## 2017-10-24 LAB — MRSA PCR SCREENING: MRSA by PCR: NEGATIVE

## 2017-10-24 LAB — APTT: aPTT: 193 seconds (ref 24–36)

## 2017-10-24 LAB — GLUCOSE, CAPILLARY: Glucose-Capillary: 109 mg/dL — ABNORMAL HIGH (ref 65–99)

## 2017-10-24 LAB — PROTIME-INR
INR: 7.69
Prothrombin Time: 64.5 seconds — ABNORMAL HIGH (ref 11.4–15.2)

## 2017-10-24 MED ORDER — FENTANYL CITRATE (PF) 100 MCG/2ML IJ SOLN
INTRAMUSCULAR | Status: AC
  Filled 2017-10-24: qty 2

## 2017-10-24 MED ORDER — ATORVASTATIN CALCIUM 80 MG PO TABS
80.0000 mg | ORAL_TABLET | Freq: Every day | ORAL | Status: DC
  Administered 2017-10-24 – 2017-10-25 (×3): 80 mg via ORAL
  Filled 2017-10-24 (×3): qty 1

## 2017-10-24 MED ORDER — ASPIRIN 81 MG PO CHEW: 324.0000 mg | CHEWABLE_TABLET | Freq: Once | ORAL | Status: DC

## 2017-10-24 MED ORDER — ATROPINE SULFATE 1 MG/10ML IJ SOSY
PREFILLED_SYRINGE | INTRAMUSCULAR | Status: AC
  Filled 2017-10-24: qty 10

## 2017-10-24 MED ORDER — OXYCODONE-ACETAMINOPHEN 5-325 MG PO TABS
1.0000 | ORAL_TABLET | Freq: Four times a day (QID) | ORAL | Status: DC | PRN
  Administered 2017-10-24: 1 via ORAL
  Filled 2017-10-24: qty 1

## 2017-10-24 MED ORDER — SODIUM CHLORIDE 0.9 % IV SOLN: INTRAVENOUS | Status: DC

## 2017-10-24 MED ORDER — NITROGLYCERIN IN D5W 200-5 MCG/ML-% IV SOLN
0.0000 ug/min | Freq: Once | INTRAVENOUS | Status: AC
  Administered 2017-10-24: 5 ug/min via INTRAVENOUS

## 2017-10-24 MED ORDER — MIDAZOLAM HCL 2 MG/2ML IJ SOLN
INTRAMUSCULAR | Status: AC
  Filled 2017-10-24: qty 2

## 2017-10-24 MED ORDER — BIVALIRUDIN BOLUS VIA INFUSION - CUPID
INTRAVENOUS | Status: DC | PRN
  Administered 2017-10-24: 93.525 mg via INTRAVENOUS

## 2017-10-24 MED ORDER — ONDANSETRON HCL 4 MG/2ML IJ SOLN: 4.0000 mg | Freq: Four times a day (QID) | INTRAMUSCULAR | Status: DC | PRN

## 2017-10-24 MED ORDER — TICAGRELOR 90 MG PO TABS
ORAL_TABLET | ORAL | Status: DC | PRN
  Administered 2017-10-24: 180 mg via ORAL

## 2017-10-24 MED ORDER — SODIUM CHLORIDE 0.9 % IV SOLN
INTRAVENOUS | Status: AC
  Administered 2017-10-24: 03:00:00 via INTRAVENOUS

## 2017-10-24 MED ORDER — VITAMIN D (ERGOCALCIFEROL) 1.25 MG (50000 UNIT) PO CAPS
50000.0000 [IU] | ORAL_CAPSULE | ORAL | Status: DC
  Administered 2017-10-24: 50000 [IU] via ORAL
  Filled 2017-10-24 (×2): qty 1

## 2017-10-24 MED ORDER — SODIUM CHLORIDE 0.9 % IV SOLN
250.0000 mL | INTRAVENOUS | Status: DC | PRN
Start: 2017-10-24 — End: 2017-10-26

## 2017-10-24 MED ORDER — NITROGLYCERIN 1 MG/10 ML FOR IR/CATH LAB
INTRA_ARTERIAL | Status: AC
  Filled 2017-10-24: qty 10

## 2017-10-24 MED ORDER — LOSARTAN POTASSIUM 50 MG PO TABS
100.0000 mg | ORAL_TABLET | Freq: Every day | ORAL | Status: DC
  Administered 2017-10-24 – 2017-10-26 (×3): 100 mg via ORAL
  Filled 2017-10-24 (×3): qty 2

## 2017-10-24 MED ORDER — ATENOLOL 50 MG PO TABS
50.0000 mg | ORAL_TABLET | Freq: Every day | ORAL | Status: DC
  Administered 2017-10-24: 50 mg via ORAL
  Filled 2017-10-24: qty 1
  Filled 2017-10-24: qty 2
  Filled 2017-10-24: qty 1

## 2017-10-24 MED ORDER — IOPAMIDOL (ISOVUE-370) INJECTION 76%
INTRAVENOUS | Status: DC | PRN
  Administered 2017-10-24: 150 mL via INTRA_ARTERIAL

## 2017-10-24 MED ORDER — HEPARIN (PORCINE) IN NACL 2-0.9 UNIT/ML-% IJ SOLN
INTRAMUSCULAR | Status: AC
  Filled 2017-10-24: qty 1000

## 2017-10-24 MED ORDER — BIVALIRUDIN TRIFLUOROACETATE 250 MG IV SOLR
INTRAVENOUS | Status: AC
  Filled 2017-10-24: qty 250

## 2017-10-24 MED ORDER — HYDRALAZINE HCL 20 MG/ML IJ SOLN
5.0000 mg | Freq: Once | INTRAMUSCULAR | Status: AC
  Administered 2017-10-24: 5 mg via INTRAVENOUS
  Filled 2017-10-24: qty 1

## 2017-10-24 MED ORDER — LIDOCAINE HCL (PF) 1 % IJ SOLN
INTRAMUSCULAR | Status: AC
  Filled 2017-10-24: qty 30

## 2017-10-24 MED ORDER — LIDOCAINE HCL (PF) 1 % IJ SOLN
INTRAMUSCULAR | Status: DC | PRN
  Administered 2017-10-24: 20 mL via INTRADERMAL

## 2017-10-24 MED ORDER — HEPARIN SODIUM (PORCINE) 5000 UNIT/ML IJ SOLN
5000.0000 [IU] | Freq: Three times a day (TID) | INTRAMUSCULAR | Status: DC
  Administered 2017-10-24 – 2017-10-26 (×5): 5000 [IU] via SUBCUTANEOUS
  Filled 2017-10-24 (×6): qty 1

## 2017-10-24 MED ORDER — NITROGLYCERIN IN D5W 200-5 MCG/ML-% IV SOLN
INTRAVENOUS | Status: AC
  Filled 2017-10-24: qty 250

## 2017-10-24 MED ORDER — ASPIRIN 81 MG PO CHEW
81.0000 mg | CHEWABLE_TABLET | Freq: Every day | ORAL | Status: DC
  Administered 2017-10-24 – 2017-10-26 (×3): 81 mg via ORAL
  Filled 2017-10-24 (×3): qty 1

## 2017-10-24 MED ORDER — NICOTINE 21 MG/24HR TD PT24
21.0000 mg | MEDICATED_PATCH | Freq: Every day | TRANSDERMAL | Status: DC
  Administered 2017-10-24 – 2017-10-26 (×3): 21 mg via TRANSDERMAL
  Filled 2017-10-24 (×3): qty 1

## 2017-10-24 MED ORDER — TICAGRELOR 90 MG PO TABS
90.0000 mg | ORAL_TABLET | Freq: Two times a day (BID) | ORAL | Status: DC
  Administered 2017-10-24 – 2017-10-26 (×5): 90 mg via ORAL
  Filled 2017-10-24 (×5): qty 1

## 2017-10-24 MED ORDER — HYDRALAZINE HCL 20 MG/ML IJ SOLN: 5.0000 mg | INTRAMUSCULAR | Status: AC | PRN

## 2017-10-24 MED ORDER — HEPARIN (PORCINE) IN NACL 2-0.9 UNIT/ML-% IJ SOLN
INTRAMUSCULAR | Status: AC | PRN
  Administered 2017-10-24: 1000 mL

## 2017-10-24 MED ORDER — LABETALOL HCL 5 MG/ML IV SOLN: 10.0000 mg | INTRAVENOUS | Status: AC | PRN

## 2017-10-24 MED ORDER — ASPIRIN EC 325 MG PO TBEC: 325.0000 mg | DELAYED_RELEASE_TABLET | Freq: Every day | ORAL | Status: DC

## 2017-10-24 MED ORDER — SODIUM CHLORIDE 0.9% FLUSH
3.0000 mL | Freq: Two times a day (BID) | INTRAVENOUS | Status: DC
  Administered 2017-10-24 – 2017-10-26 (×3): 3 mL via INTRAVENOUS

## 2017-10-24 MED ORDER — IOPAMIDOL (ISOVUE-370) INJECTION 76%
INTRAVENOUS | Status: AC
  Filled 2017-10-24: qty 125

## 2017-10-24 MED ORDER — VERAPAMIL HCL 2.5 MG/ML IV SOLN
INTRAVENOUS | Status: AC
  Filled 2017-10-24: qty 2

## 2017-10-24 MED ORDER — VERAPAMIL HCL 2.5 MG/ML IV SOLN
INTRAVENOUS | Status: AC
Start: 2017-10-24 — End: ?
  Filled 2017-10-24: qty 2

## 2017-10-24 MED ORDER — IOPAMIDOL (ISOVUE-370) INJECTION 76%
INTRAVENOUS | Status: AC
  Filled 2017-10-24: qty 100

## 2017-10-24 MED ORDER — TICAGRELOR 90 MG PO TABS
ORAL_TABLET | ORAL | Status: AC
  Filled 2017-10-24: qty 2

## 2017-10-24 MED ORDER — SODIUM CHLORIDE 0.9 % IV SOLN
INTRAVENOUS | Status: AC | PRN
  Administered 2017-10-24 (×2): 1.75 mg/kg/h via INTRAVENOUS

## 2017-10-24 MED ORDER — FENTANYL CITRATE (PF) 100 MCG/2ML IJ SOLN
INTRAMUSCULAR | Status: DC | PRN
  Administered 2017-10-24: 25 ug via INTRAVENOUS
  Administered 2017-10-24: 50 ug via INTRAVENOUS
  Administered 2017-10-24: 25 ug via INTRAVENOUS

## 2017-10-24 MED ORDER — ACETAMINOPHEN 325 MG PO TABS: 650.0000 mg | ORAL_TABLET | ORAL | Status: DC | PRN

## 2017-10-24 MED ORDER — MIDAZOLAM HCL 2 MG/2ML IJ SOLN
INTRAMUSCULAR | Status: DC | PRN
  Administered 2017-10-24 (×2): 1 mg via INTRAVENOUS

## 2017-10-24 MED ORDER — SODIUM CHLORIDE 0.9% FLUSH
3.0000 mL | INTRAVENOUS | Status: DC | PRN
  Administered 2017-10-26: 3 mL via INTRAVENOUS
  Filled 2017-10-24: qty 3

## 2017-10-24 NOTE — Progress Notes (Signed)
[id number] MI education started with pt. Reviewed NTG use, risk factors, smoking cessation, MI restrictions, heart healthy and low carb food choices, CRP 2. Pt has attended CRP 2 before. Will refer to [place] again. Gave pt smoking cessation handout and offered fake cigarette. He has never been able to quit smoking before. Stated he is going to try nicotine patches. Offered to walk but pt stated he would like to rest and take a nap. We will follow up tomorrow and walk and give ex ed. Needs to see case manager re brilinta. Graylon Good RN BSN 10/24/2017 2:19 PM

## 2017-10-24 NOTE — ED Notes (Signed)
Dr. Teena Dunk notified on pt.'s Troponin result.

## 2017-10-24 NOTE — Progress Notes (Signed)
Progress Note  Patient Name: Curtis Perez Date of Encounter: 10/24/2017  Primary Cardiologist: Dr. Acie Fredrickson  Subjective   Feeling well today, free of chest pain, diaphoresis, or difficulty breathing. All questions answered, no new concerns.   Inpatient Medications    Scheduled Meds: . aspirin  324 mg Oral Once  . aspirin  81 mg Oral Daily  . atenolol  50 mg Oral Daily  . atorvastatin  80 mg Oral q1800  . heparin  5,000 Units Subcutaneous Q8H  . losartan  100 mg Oral Daily  . sodium chloride flush  3 mL Intravenous Q12H  . ticagrelor  90 mg Oral BID  . Vitamin D (Ergocalciferol)  50,000 Units Oral Q M,W,F   Continuous Infusions: . sodium chloride    . sodium chloride     PRN Meds: sodium chloride, acetaminophen, ondansetron (ZOFRAN) IV, sodium chloride flush   Vital Signs    Vitals:   10/24/17 0800 10/24/17 0815 10/24/17 0819 10/24/17 0830  BP: 126/72 126/76  131/68  Pulse:      Resp: 18 12  17   Temp:   (!) 97.4 F (36.3 C)   TempSrc:   Oral   SpO2: 98% 100%  100%  Weight:      Height:        Intake/Output Summary (Last 24 hours) at 10/24/2017 0901 Last data filed at 10/24/2017 0600 Gross per 24 hour  Intake 540 ml  Output 175 ml  Net 365 ml   Filed Weights   10/23/17 2049 10/24/17 0258  Weight: 275 lb (124.7 kg) 294 lb 15.6 oz (133.8 kg)    Telemetry    Sinus rhythm, frequent PVCs sometimes in pairs, 3 beat run of vtach - Personally Reviewed  ECG    Sinus rhythm, q waves in III and avf - Personally Reviewed  Physical Exam     GEN: Sitting in bed resting, well appearing, No acute distress.   Neck: No JVD Cardiac: RRR, no murmurs, rubs, or gallops.  Respiratory: Clear to auscultation bilaterally. GI: Soft, nontender, non-distended  MS: No edema; No deformity. Neuro:  Nonfocal  Psych: Normal affect   Labs    Chemistry Recent Labs  Lab 10/23/17 2048 10/24/17 0247  NA 130* 128*  K 3.4* 4.4  CL 96* 99*  CO2 24 21*  GLUCOSE 194* 120*    BUN 10 12  CREATININE 1.37* 1.12  CALCIUM 8.7* 8.5*  PROT  --  5.8*  ALBUMIN  --  3.2*  AST  --  56*  ALT  --  15*  ALKPHOS  --  79  BILITOT  --  0.7  GFRNONAA 53* >60  GFRAA >60 >60  ANIONGAP 10 8     Hematology Recent Labs  Lab 10/23/17 2048 10/24/17 0247  WBC 7.2 8.4  RBC 4.92 4.67  HGB 14.2 13.4  HCT 41.1 38.6*  MCV 83.5 82.7  MCH 28.9 28.7  MCHC 34.5 34.7  RDW 13.9 14.0  PLT 219 225    Cardiac Enzymes Recent Labs  Lab 10/23/17 2335 10/24/17 0247  TROPONINI 0.96* 10.04*    Recent Labs  Lab 10/23/17 2057  TROPIPOC 0.02     BNPNo results for input(s): BNP, PROBNP in the last 168 hours.   DDimer No results for input(s): DDIMER in the last 168 hours.   Radiology    Dg Chest 2 View  Result Date: 10/23/2017 CLINICAL DATA:  Profuse sweating earlier today with weakness; Hx of CABG X 1, cardiac stent  placed, Hx of MI, smoker, diabetic EXAM: CHEST  2 VIEW COMPARISON:  08/08/2014 FINDINGS: Status post median sternotomy. The heart is normal in size. There are no focal consolidations or pleural effusions. There is mild streaky atelectasis in the lung bases. No pulmonary edema. IMPRESSION: 1. No pulmonary edema. 2. Mild bibasilar atelectasis. Electronically Signed   By: Nolon Nations M.D.   On: 10/23/2017 21:45   Dg Chest Port 1 View  Result Date: 10/24/2017 CLINICAL DATA:  Status post coronary catheterization EXAM: PORTABLE CHEST 1 VIEW COMPARISON:  10/23/2017 FINDINGS: Cardiac shadow is mildly enlarged but stable. Postsurgical changes are again seen. The lungs are well aerated bilaterally. No focal infiltrate or sizable effusion is noted. No bony abnormality is seen. IMPRESSION: No acute abnormality noted. Electronically Signed   By: Inez Catalina M.D.   On: 10/24/2017 08:48    Cardiac Studies    Prox Cx lesion is 99% stenosed. SVG to OM is occluded. Thrombectomy, followed by a drug-eluting stent which was successfully placed using a STENT SYNERGY DES 3.5X20,  postdilated to 3.75.  Post intervention, there is a 0% residual stenosis.  Ost 1st Mrg lesion is 70% stenosed. Balloon angioplasty was performed in the ostial vessel before and after circumflex stent placement, using a BALLOON SAPPHIRE 2.5X12.  Post intervention, there is a 20% residual stenosis.  Dist Cx lesion is 100% stenosed. Balloon angioplasty ws attempted but the lesion was too distal in the vessel.  Prox LAD lesion is 90% stenosed. LIMA to LAD is patent.  Prox RCA lesion is 100% stenosed. SVG to RCA is occluded.  LV end diastolic pressure is mildly elevated.  There is no aortic valve stenosis.   Continue angiomax for another hour.  He will need dual antiplatelet therapy for at least a year, along with aggressive secondary prevention.    Watch in ICU.  Patient Profile     66 y.o. male with PMH CAD s/p CABG 2013, tobacco abuse, hyperlipidemia and hypertension. He presented with right sided chest soreness associated with diaphoresis and weakness which did not resolve with nitroglycerine and had a trop of 0.96 and EKG with equivocal inferior ST changes given baseline inferior Q waves and prior inferior MI. Reports a similar episode 1 month ago which resolved in a couple of hours. Was placed heparin gtt and IV nitro and taken for emergent cath. Cath revealed 99% stenosis of the circumflex and occlusion of the SVG to OM and SVT to RCA. DES was placed in the proximal circumflex, the distal circumflex was 100% stenosed, not amendable to balloon angioplasty.   Assessment & Plan    Acute coronary syndrome s/p CABG 2013 - Presented with chest pain not relieved by IV nitroglycerine, troponin with peak 10.4. Emergent cath revealed thrombus in the circumflex, DES was placed and he was started on IV bivalirudin. Follow up echo for evaluation of the mitral valve. Medical management is with aspirin 81 mg daily, brilinta 90 mg BID, atenolol 50 mg qd, atorvastatin 80 mg qd and losartan 100 mg  qd. Blood pressure will allow the addition of imdur.   Type 2 diabetes mellitus - Last A1c in august 6.0, controlled on metformin outpatient   Hypertension - Blood pressure uncontrolled last night ( SBP 150s) however it is under better control this morning ( SBP 120-130s ) on losartan 100 mg qd.   Tobacco use disorder - smoking 2 PPD, pre contemplative, continued encouragement of cessation.   Hyperlipidemia - LDL is 116, was on zetia at  home, started atorvastatin 80 mg qd, will need to repeat lipid panel in 2 -4 weeks to document improvement in LDL to < 70.   For questions or updates, please contact [place] Please consult [email or web address] for contact info under Cardiology/STEMI.      Signed, Ledell Noss, MD  10/24/2017, 9:01 AM    Patient seen and examined. Agree with assessment and plan. Pt was admitted last evening with recurrent chest pain and old Q waves but additional inferior STE. Due to persistent pain he was taken to cath lab. Cath data reviewed.  S/p PCI to proximal native LCX/  With very distal LCX occlusion.  He has an occluded graft to OM and native RCA  and graft are occluded.  Will add low dose nitrates, continue BB, consider adding amlodipine for additional anti-ischemic benefit. Discussed smoking cessation. To have echo today. Continue DAPT indefinitely.   Troy Sine, MD, [facility] 10/24/2017 11:56 AM

## 2017-10-24 NOTE — ED Notes (Signed)
Patient transported to cath lab

## 2017-10-24 NOTE — Progress Notes (Signed)
Spoke to Dr. Teena Dunk and updated on patient condition. Currenly hypertensive SBP >160. Order received for hydralazine and order to not infuse nitrogylcerin drip d/t absence of chest pain.

## 2017-10-24 NOTE — ED Notes (Signed)
Lab called by RN to follow up on pt.'s Troponin  Result.

## 2017-10-24 NOTE — Progress Notes (Signed)
Femoral Sheath removed at [phone]. RN held pressure for 25 min. Site clean, dry, level 0.  Vitals stable throughout removal. Pt educated about staying flat for 4-6 hours. Will continue to monitor.

## 2017-10-24 NOTE — H&P (Addendum)
Cardiology Admission History and Physical:   Patient ID: Curtis Perez; MRN: [id number]; DOB: [birth year]   Admission date: 10/23/2017  Primary Care Provider: Unk Pinto, MD Primary Cardiologist: Dr. Acie Fredrickson Primary Electrophysiologist:    Chief Complaint:  66  y  Old  With CAD, CABG  2013  Still smokes  2  Packs  A day  , started having cp 3/10, on  Hep gtt  And  Nitro , trop  At  0.96  Patient Profile:  HPI  Curtis Perez is a 66 y.o. male with a history of  CABG  In 2013,   History of Present Illness:   Curtis Perez HAVING  CHEST  PAIN , CENTRAL LOCATION , radiating to left   Side of  The neck  And  Arm ,  He STILL SMOKES   ABOUT  2  PACKS A  DAY.  Initially was chest pain free after initial management.  Initial troponin was negative.  Then, chest pain recurred. THE  CHEST  PAIN IN THE ER  Wasn't  Resolved  With  Nitro, S/L  And  Still had  3/10   Pain ,  With  Troponin  At  0.96  AND ekg  Showed more pronounced inferior ST changes.  Of note, he had prior inferior MI with Q waves, making ECG interpretation more difficult. Second troponin was positive and due to residual pain, emergent cath was considered. IV NTG  And heparin did not relieve the pain.     Past Medical History:  Diagnosis Date  . Adenomatous colon polyp 02/15/2009  . Bradycardia   . CAD (coronary artery disease)    remote MI in 2004 with PCI; s/p CABG x 3 03/2012  . Depression   . Family history of malignant neoplasm of gastrointestinal tract   . GERD (gastroesophageal reflux disease)   . Hyperlipidemia   . Hypertension   . Hypothyroidism   . Myocardial infarction ([place])    2004  . Obesity   . Prediabetes   . 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 Tobacco abuse 03/28/2012  . Vitamin D deficiency     Past Surgical History:  Procedure Laterality Date  . BACK SURGERY    . COLONOSCOPY  02/15/2009  . CORONARY ANGIOPLASTY WITH STENT PLACEMENT     about  8 years ago at [facility]  . KNEE SURGERY    . TONSILLECTOMY       Medications Prior to Admission: Prior to Admission medications   Medication Sig Start Date End Date Taking? Authorizing Provider  aspirin EC 325 MG tablet Take 325 mg daily by mouth.   Yes [provider]  atenolol (TENORMIN) 50 MG tablet Take 1 tablet (50 mg total) by mouth daily. 07/15/17  Yes Vicie Mutters, PA-C  Cyanocobalamin (VITAMIN B-12 PO) Take 1,000 mcg by mouth daily.    Yes [provider]  ezetimibe (ZETIA) 10 MG tablet TAKE 1 TABLET BY MOUTH EVERY DAY FOR CHOLESTEROL Patient taking differently: Take 10 mg daily by mouth. FOR CHOLESTEROL 07/10/16  Yes Unk Pinto, MD  FLUoxetine (PROZAC) 40 MG capsule TAKE 1 CAPSULE DAILY FOR MOOD Patient taking differently: Take 40 mg daily by mouth. FOR MOOD 07/15/17  Yes Vicie Mutters, PA-C  IRON PO Take 65 mg (of elemental iron) every other day   Yes [provider]  levothyroxine (SYNTHROID, LEVOTHROID) 100 MCG tablet TAKE  1 TABLET (100 MCG TOTAL) BY MOUTH DAILY BEFORE BREAKFAST. Patient taking differently: Take 100 mcg by mouth once a day before breakfast 09/06/17  Yes Unk Pinto, MD  losartan (COZAAR) 100 MG tablet TAKE 1/2 TO 1 TABLET DAILY FOR BLOOD PRESSURE Patient taking differently: Take 100 mg daily by mouth. FOR BLOOD PRESSURE 05/21/17  Yes Vicie Mutters, PA-C  Magnesium 400 MG CAPS Take 400 mg 4 (four) times daily by mouth.    Yes [provider]  metFORMIN (GLUCOPHAGE-XR) 500 MG 24 hr tablet TAKE 1 TABLET WITH BREAKFAST AND LUNCH & 2 TABLETS AT SUPPER FOR DIABETES Patient taking differently: Take 1,000 mg by mouth two times a day 07/29/17  Yes Unk Pinto, MD  Omega-3 Fatty Acids (FISH OIL PO) Take 2,000 mg by mouth daily.   Yes [provider]  omeprazole (PRILOSEC) 40 MG capsule Take 1 capsule daily for acid Indigestion & Reflux Patient taking differently: Take 40 mg daily by mouth.  08/07/17  03/07/18 Yes Unk Pinto, MD  Potassium 75 MG TABS Take 75 mg daily by mouth.    Yes [provider]  Vitamin D, Ergocalciferol, (DRISDOL) 50000 UNITS CAPS capsule Take 1 capsule (50,000 Units total) by mouth 3 (three) times a week. Patient taking differently: Take 50,000 Units every Monday, Wednesday, and Friday by mouth.  09/23/15  Yes Forcucci, Courtney, PA-C     Allergies:    Allergies  Allergen Reactions  . Lisinopril Cough    Social History:   Social History   Socioeconomic History  . Marital status: Divorced    Spouse name: Not on file  . Number of children: 0  . Years of education: Not on file  . Highest education level: Not on file  Social Needs  . Financial resource strain: Not on file  . Food insecurity - worry: Not on file  . Food insecurity - inability: Not on file  . Transportation needs - medical: Not on file  . Transportation needs - non-medical: Not on file  Occupational History  . Occupation: Disability  Tobacco Use  . Smoking status: Current Every Day Smoker    Packs/day: 2.00    Years: 40.00    Pack years: 80.00    Types: Cigarettes  . Smokeless tobacco: Never Used  . Tobacco comment: down from 2ppd  Substance and Sexual Activity  . Alcohol use: No    Alcohol/week: 1.2 oz    Types: 1 Glasses of wine, 1 Shots of liquor per week    Comment: Rarely  . Drug use: No  . Sexual activity: Not Currently  Other Topics Concern  . Not on file  Social History Narrative  . Not on file    Family History:   The patient's family history includes Alzheimer's disease in his mother; Colon cancer in his father and paternal grandmother; Hypertension in his mother. There is no history of Esophageal cancer, Rectal cancer, or Stomach cancer.    ROS:  Please see the history of present illness.  All other ROS reviewed and negative.   , EXCEPT  FOR  CHEST  PAIN   Physical Exam/Data:   Vitals:   10/23/17 2330 10/23/17 2345 10/24/17 0000 10/24/17 0015    BP: 126/67 (!) 147/96 (!) 146/96 (!) 146/69  Pulse: 64 67 63 68  Resp: 13 15 10 15   SpO2: 99% 99% 98% 96%  Weight:      Height:       No intake or output data in the 24 hours  ending 10/24/17 0049 Filed Weights   10/23/17 2049  Weight: 124.7 kg (275 lb)   Body mass index is 34.37 kg/m.  General:  Well nourished, well developed, in no acute distress HEENT: normal Lymph: no adenopathy Neck: no JVD Endocrine:  No thryomegaly Vascular: No carotid bruits; FA pulses 2+ bilaterally without bruits  Cardiac:  normal S1, S2; RRR; no murmur  Lungs:  clear to auscultation bilaterally, no wheezing, rhonchi or rales  Abd: soft, nontender, no hepatomegaly  Ext: no edema Musculoskeletal:  No deformities, BUE and BLE strength normal and equal Skin: warm and dry  Neuro:  CNs 2-12 intact, no focal abnormalities noted Psych:  Normal affect    EKG:  The ECG that was done  was personally reviewed and demonstrates  INFERIOR STEMI  Relevant CV Studies:   Laboratory Data:  Chemistry Recent Labs  Lab 10/23/17 2048  NA 130*  K 3.4*  CL 96*  CO2 24  GLUCOSE 194*  BUN 10  CREATININE 1.37*  CALCIUM 8.7*  GFRNONAA 53*  GFRAA >60  ANIONGAP 10    No results for input(s): PROT, ALBUMIN, AST, ALT, ALKPHOS, BILITOT in the last 168 hours. Hematology Recent Labs  Lab 10/23/17 2048  WBC 7.2  RBC 4.92  HGB 14.2  HCT 41.1  MCV 83.5  MCH 28.9  MCHC 34.5  RDW 13.9  PLT 219   Cardiac Enzymes Recent Labs  Lab 10/23/17 2335  TROPONINI 0.96*    Recent Labs  Lab 10/23/17 2057  TROPIPOC 0.02    BNPNo results for input(s): BNP, PROBNP in the last 168 hours.  DDimer No results for input(s): DDIMER in the last 168 hours.  Radiology/Studies:  Dg Chest 2 View  Result Date: 10/23/2017 CLINICAL DATA:  Profuse sweating earlier today with weakness; Hx of CABG X 1, cardiac stent placed, Hx of MI, smoker, diabetic EXAM: CHEST  2 VIEW COMPARISON:  08/08/2014 FINDINGS: Status post median  sternotomy. The heart is normal in size. There are no focal consolidations or pleural effusions. There is mild streaky atelectasis in the lung bases. No pulmonary edema. IMPRESSION: 1. No pulmonary edema. 2. Mild bibasilar atelectasis. Electronically Signed   By: Nolon Nations M.D.   On: 10/23/2017 21:45   physcial exam  heent   Perrla Chest - clear, no  Rhonchi Cvs: s1 s2 nl , no increase in JVD ABD- SOFT  , NO  ORGANOMEGALY Assessment and Plan:   1. Acute coronary syndrome: Equivocal inferior ST changes in patient with persistent chest pain. 2. DM 3. HTN  4.  5. Hep gtt, nito gtt, cath  Lab transfer ACS orders  Placed   D/wInterventional cardiology  On  call Severity of Illness: The appropriate patient status for this patient is INPATIENT. Inpatient status is judged to be reasonable and necessary in order to provide the required intensity of service to ensure the patient's safety. The patient's presenting symptoms, physical exam findings, and initial radiographic and laboratory data in the context of their chronic comorbidities is felt to place them at high risk for further clinical deterioration. Furthermore, it is not anticipated that the patient will be medically stable for discharge from the hospital within 2 midnights of admission. The following factors support the patient status of inpatient.   " The patient's presenting symptoms include CHEST  PAIN "  * I certify that at the point of admission it is my clinical judgment that the patient will require inpatient hospital care spanning beyond 2 midnights from the  point of admission due to high intensity of service, high risk for further deterioration and high frequency of surveillance required.*    For questions or updates, please contact [place] Please consult [email or web address] for contact info under Cardiology/STEMI.    Signed, Johna Sheriff, MD  10/24/2017 12:49 AM   I have examined the patient and reviewed assessment and  plan and discussed with patient.  Agree with above as stated.  I personally reviewed the ECG and made the decision for emergent cath due to recurrent pain.  ECG was equivocal due to baseline inferior Q waves and prior inferior MI.  Cath showed 1/3 patent grafts.  Heavy thrombus in the circumflex.  DES to mid circumflex with PTCA to distal circ occlusion and OM1.  Continue IV angiomax for an hour piost procedure to help with residual thrombus,.  Start high dose statin. He will need echo with particular attention to the mitral apparatus.  LVEDP was normal and no sign of heart failure.   CRI.  Hydration post procedure.  F/u with Dr. Acie Fredrickson.  Larae Grooms

## 2017-10-25 ENCOUNTER — Inpatient Hospital Stay (HOSPITAL_COMMUNITY): Payer: 59

## 2017-10-25 DIAGNOSIS — I2119 ST elevation (STEMI) myocardial infarction involving other coronary artery of inferior wall: Secondary | ICD-10-CM

## 2017-10-25 LAB — BASIC METABOLIC PANEL
Anion gap: 6 (ref 5–15)
BUN: <5 mg/dL — ABNORMAL LOW (ref 6–20)
CO2: 26 mmol/L (ref 22–32)
Calcium: 8.4 mg/dL — ABNORMAL LOW (ref 8.9–10.3)
Chloride: 99 mmol/L — ABNORMAL LOW (ref 101–111)
Creatinine, Ser: 1.01 mg/dL (ref 0.61–1.24)
GFR calc Af Amer: >60 mL/min (ref >60)
GFR calc non Af Amer: >60 mL/min (ref >60)
Glucose, Bld: 107 mg/dL — ABNORMAL HIGH (ref 65–99)
Potassium: 4.3 mmol/L (ref 3.5–5.1)
Sodium: 131 mmol/L — ABNORMAL LOW (ref 135–145)

## 2017-10-25 LAB — MAGNESIUM: Magnesium: 1.7 mg/dL (ref 1.7–2.4)

## 2017-10-25 LAB — ECHOCARDIOGRAM COMPLETE
Height: 75 in
Weight: 4719.61 oz

## 2017-10-25 MED ORDER — MAGNESIUM SULFATE 2 GM/50ML IV SOLN
2.0000 g | Freq: Once | INTRAVENOUS | Status: AC
  Administered 2017-10-25: 2 g via INTRAVENOUS
  Filled 2017-10-25: qty 50

## 2017-10-25 MED ORDER — ISOSORBIDE MONONITRATE ER 30 MG PO TB24
15.0000 mg | ORAL_TABLET | Freq: Every day | ORAL | Status: DC
  Filled 2017-10-25: qty 1

## 2017-10-25 MED ORDER — ISOSORBIDE MONONITRATE ER 30 MG PO TB24
30.0000 mg | ORAL_TABLET | Freq: Every day | ORAL | Status: DC
  Administered 2017-10-25 – 2017-10-26 (×2): 30 mg via ORAL
  Filled 2017-10-25: qty 1

## 2017-10-25 MED ORDER — METOPROLOL TARTRATE 25 MG PO TABS
25.0000 mg | ORAL_TABLET | Freq: Three times a day (TID) | ORAL | Status: DC
  Administered 2017-10-25 (×2): 25 mg via ORAL
  Filled 2017-10-25 (×2): qty 1

## 2017-10-25 NOTE — Progress Notes (Signed)
Called by nursing staff - told the patient expressed his wish to be DNR. Said he had been DNR in the past. I have placed the DNR order per patient wishes.  Pixie Casino, MD, [place] Director of the [facility] Attending Cardiologist  Direct Dial: [phone]  Fax: [phone]  Website:  [email or web address]

## 2017-10-25 NOTE — Progress Notes (Signed)
CARDIAC REHAB PHASE I   PRE:  Rate/Rhythm: 58 SB  BP:  Supine:   Sitting: 120/65  Standing:    SaO2:   MODE:  Ambulation: 370 ft   POST:  Rate/Rhythm: 75 SR  BP:  Supine:   Sitting: 122/58  Standing:    SaO2: 98%RA 1030-1050 Pt walked 370 ft with steady gait and no CP. Could have gone farther. Tolerated well. Can walk independently. Ex ed completed. Has not seen case manager re brilinta so called and notified case manager that pt is on [place].   Graylon Good, RN BSN  10/25/2017 10:47 AM

## 2017-10-25 NOTE — Progress Notes (Signed)
Progress Note  Patient Name: Curtis Perez Date of Encounter: 10/25/2017  Primary Cardiologist: Dr. Acie Fredrickson  Subjective   Says hes feeling well today, no new concerns or complaints, would like to go home, reports no symptoms related to v tach   Inpatient Medications    Scheduled Meds: . aspirin  324 mg Oral Once  . aspirin  81 mg Oral Daily  . atenolol  50 mg Oral Daily  . atorvastatin  80 mg Oral q1800  . heparin  5,000 Units Subcutaneous Q8H  . losartan  100 mg Oral Daily  . nicotine  21 mg Transdermal Daily  . sodium chloride flush  3 mL Intravenous Q12H  . ticagrelor  90 mg Oral BID  . Vitamin D (Ergocalciferol)  50,000 Units Oral Q M,W,F   Continuous Infusions: . sodium chloride    . sodium chloride     PRN Meds: sodium chloride, acetaminophen, ondansetron (ZOFRAN) IV, oxyCODONE-acetaminophen, sodium chloride flush   Vital Signs    Vitals:   10/25/17 0004 10/25/17 0100 10/25/17 0200 10/25/17 0400  BP:  127/72 116/73   Pulse:      Resp:  19 19   Temp: 97.8 F (36.6 C)   98.1 F (36.7 C)  TempSrc: Oral   Oral  SpO2:  97% 99%   Weight:      Height:        Intake/Output Summary (Last 24 hours) at 10/25/2017 0613 Last data filed at 10/25/2017 0000 Gross per 24 hour  Intake 500 ml  Output 1925 ml  Net -1425 ml   Filed Weights   10/23/17 2049 10/24/17 0258  Weight: 275 lb (124.7 kg) 294 lb 15.6 oz (133.8 kg)    Telemetry    Frequent PVCS, 3:20 am run of 7 PVCs, 9:50 PM 3 runs of vtach, possible sustained vtach at 2 pm yesterday - Personally Reviewed  Physical Exam   GEN: sitting up in a chair beside the bed, No acute distress.   Neck: No JVD Cardiac: heart sounds are distant, RRR, no murmurs appreciated  Respiratory: Clear to auscultation bilaterally. GI: Soft, nontender, non-distended  MS: No edema; No deformity. Neuro:  Nonfocal  Psych: Normal affect   Labs    Chemistry Recent Labs  Lab 10/23/17 2048 10/24/17 0247 10/25/17 0422  NA  130* 128* 131*  K 3.4* 4.4 4.3  CL 96* 99* 99*  CO2 24 21* 26  GLUCOSE 194* 120* 107*  BUN 10 12 <5*  CREATININE 1.37* 1.12 1.01  CALCIUM 8.7* 8.5* 8.4*  PROT  --  5.8*  --   ALBUMIN  --  3.2*  --   AST  --  56*  --   ALT  --  15*  --   ALKPHOS  --  79  --   BILITOT  --  0.7  --   GFRNONAA 53* >60 >60  GFRAA >60 >60 >60  ANIONGAP 10 8 6      Hematology Recent Labs  Lab 10/23/17 2048 10/24/17 0247  WBC 7.2 8.4  RBC 4.92 4.67  HGB 14.2 13.4  HCT 41.1 38.6*  MCV 83.5 82.7  MCH 28.9 28.7  MCHC 34.5 34.7  RDW 13.9 14.0  PLT 219 225    Cardiac Enzymes Recent Labs  Lab 10/23/17 2335 10/24/17 0247  TROPONINI 0.96* 10.04*    Recent Labs  Lab 10/23/17 2057  TROPIPOC 0.02   BNPNo results for input(s): BNP, PROBNP in the last 168 hours.   DDimer No  results for input(s): DDIMER in the last 168 hours.   Radiology    Dg Chest 2 View  Result Date: 10/23/2017 CLINICAL DATA:  Profuse sweating earlier today with weakness; Hx of CABG X 1, cardiac stent placed, Hx of MI, smoker, diabetic EXAM: CHEST  2 VIEW COMPARISON:  08/08/2014 FINDINGS: Status post median sternotomy. The heart is normal in size. There are no focal consolidations or pleural effusions. There is mild streaky atelectasis in the lung bases. No pulmonary edema. IMPRESSION: 1. No pulmonary edema. 2. Mild bibasilar atelectasis. Electronically Signed   By: Nolon Nations M.D.   On: 10/23/2017 21:45   Dg Chest Port 1 View  Result Date: 10/24/2017 CLINICAL DATA:  Status post coronary catheterization EXAM: PORTABLE CHEST 1 VIEW COMPARISON:  10/23/2017 FINDINGS: Cardiac shadow is mildly enlarged but stable. Postsurgical changes are again seen. The lungs are well aerated bilaterally. No focal infiltrate or sizable effusion is noted. No bony abnormality is seen. IMPRESSION: No acute abnormality noted. Electronically Signed   By: Inez Catalina M.D.   On: 10/24/2017 08:48    Cardiac Studies   Cath 11/6  Prox Cx lesion  is 99% stenosed. SVG to OM is occluded. Thrombectomy, followed by a drug-eluting stent which was successfully placed using a STENT SYNERGY DES 3.5X20, postdilated to 3.75.  Post intervention, there is a 0% residual stenosis.  Ost 1st Mrg lesion is 70% stenosed. Balloon angioplasty was performed in the ostial vessel before and after circumflex stent placement, using a BALLOON SAPPHIRE 2.5X12.  Post intervention, there is a 20% residual stenosis.  Dist Cx lesion is 100% stenosed. Balloon angioplasty ws attempted but the lesion was too distal in the vessel.  Prox LAD lesion is 90% stenosed. LIMA to LAD is patent.  Prox RCA lesion is 100% stenosed. SVG to RCA is occluded.  LV end diastolic pressure is mildly elevated.  There is no aortic valve stenosis.  Continue angiomax for another hour.  He will need dual antiplatelet therapy for at least a year, along with aggressive secondary prevention.   Watch in ICU.    Patient Profile     66 y.o. male with PMH CAD s/p CABG 2013, tobacco abuse, hyperlipidemia and hypertension. He presented with right sided chest soreness associated with diaphoresis and weakness which did not resolve with nitroglycerine and had a trop of 0.96 and EKG with equivocal inferior ST changes given baseline inferior Q waves and prior inferior MI. Reports a similar episode 1 month ago which resolved in a couple of hours. Was placed heparin gtt and IV nitro and taken for emergent cath on 11/6. Cath revealed 99% stenosis of the circumflex and occlusion of the SVG to OM and SVT to RCA. DES was placed in the proximal circumflex, the distal circumflex was 100% stenosed, not amendable to balloon angioplasty.   Assessment & Plan    Acute coronary syndrome s/p CABG 2013-DES placed to proximal native circumflex with residual occlusion of the distal circumflex, graft to OM, and native RCA and graft.  He is being medically managed with aspirin, Brilinta, atenolol 50 mg daily,  atorvastatin 80 mg daily and losartan 100 mg daily. Isosorbide mononitrate 15 mg added. Heart rate is in the 50s, this may be the maximal beta blocker dose that he can tolerate. Follow up echo. Will need DAPT indefinitely.   Type 2 diabetes mellitus-blood glucose is well controlled without insulin   Hypertension-blood pressure is improved today ( 116/73) losartan, atenolol, and isosorbide  Tobacco use disorder-  continuing to encourage smoking cessation, he is pre contemplative but willing to try a nicotine patch while inpatient   Hyperlipidemia - on atorvastatin 80 mg qd, LDL 116, was on zetia at home, can hold off on adding this back unless his LDL remains uncontrolled at follow up lipid panel in 2-4 weeks.   Hyponatremia - This may be chronic, improving gradually, will continue to trend.   For questions or updates, please contact [place] Please consult [email or web address] for contact info under Cardiology/STEMI.      Signed, Curtis Noss, MD    10/25/2017, 6:13 AM     Patient seen and examined. Agree with assessment and plan. No recurrent chest pain. Getting echo now at bedside; reviewed preliminary findings with patient. Had recurrent NSVT yesterday.  Will change atenolol to metoprolol to 25 mg q 8 hrs today.  Will continue losartan 100 mg daily, and in future with reduced EF can ultimately transtion to entresto as outpatient. Will increase imdur to 30 mg daily. No JVD on exam. No rales RRR 58 - 62, BS+, no edema. ECG today with SB 54 with inferior Q waves and T wave changes. Keep in CCU today and probably transfer tomorrow.  Now on nicotine patch. LDL 116; now off zetia and on atorvastaton 80 mg.    Troy Sine, MD, [facility] 10/25/2017 9:07 AM

## 2017-10-25 NOTE — Progress Notes (Signed)
#  8. S/W ANTONY @ [facility] # [phone]    BRILINTA 90 MG BID   COVER- YES  CO-PAY- $ 60.00  TIER- 3 DRUG  PRIOR APPROVAL-NO  DEDUCTIBLE-MET   PREFERRED PHARMACY: [facility]

## 2017-10-25 NOTE — Progress Notes (Signed)
Pt expressed with day shift RN and myself that he wants to be code status DNR. States he was a DNR before this admission, and would like to update current full code status to DNR. On-call cardiologist paged and made aware. Orders placed. Will continue to monitor.

## 2017-10-25 NOTE — Care Management Note (Signed)
Case Management Note  Patient Details  Name: Curtis Perez MRN: [id number] Date of Birth: June 25, 1951  Subjective/Objective:   From home alone, pta indep, s/p stent intervention, will be on brilinta, NCM gave patient the $5 co pay coupon card, [facility] [address] [facility]. He has been up ambulating 370 feet with cardiac rehab.   Most likely will dc tomorrow.                 Action/Plan: DC home when stable.  Expected Discharge Date:  10/27/17               Expected Discharge Plan:  Home/Self Care  In-House Referral:     Discharge planning Services  CM Consult  Post Acute Care Choice:    Choice offered to:     DME Arranged:    DME Agency:     HH Arranged:    HH Agency:     Status of Service:  Completed, signed off  If discussed at [employer] of Stay Meetings, dates discussed:    Additional Comments:  Zenon Mayo, RN 10/25/2017, 4:51 PM

## 2017-10-25 NOTE — Progress Notes (Signed)
  Echocardiogram 2D Echocardiogram has been performed.  Joshua Zeringue T Melvin Marmo 10/25/2017, 9:33 AM

## 2017-10-26 ENCOUNTER — Telehealth (HOSPITAL_COMMUNITY): Payer: Self-pay

## 2017-10-26 DIAGNOSIS — I472 Ventricular tachycardia: Secondary | ICD-10-CM

## 2017-10-26 LAB — BASIC METABOLIC PANEL
Anion gap: 7 (ref 5–15)
BUN: 7 mg/dL (ref 6–20)
CO2: 25 mmol/L (ref 22–32)
Calcium: 8.6 mg/dL — ABNORMAL LOW (ref 8.9–10.3)
Chloride: 99 mmol/L — ABNORMAL LOW (ref 101–111)
Creatinine, Ser: 1.09 mg/dL (ref 0.61–1.24)
GFR calc Af Amer: >60 mL/min (ref >60)
GFR calc non Af Amer: >60 mL/min (ref >60)
Glucose, Bld: 125 mg/dL — ABNORMAL HIGH (ref 65–99)
Potassium: 4.6 mmol/L (ref 3.5–5.1)
Sodium: 131 mmol/L — ABNORMAL LOW (ref 135–145)

## 2017-10-26 MED ORDER — NITROGLYCERIN 0.4 MG SL SUBL: 0.4000 mg | SUBLINGUAL_TABLET | SUBLINGUAL | 2 refills | Status: AC | PRN

## 2017-10-26 MED ORDER — METOPROLOL TARTRATE 25 MG PO TABS: 25.0000 mg | ORAL_TABLET | Freq: Two times a day (BID) | ORAL | Status: DC

## 2017-10-26 MED ORDER — TICAGRELOR 90 MG PO TABS: 90.0000 mg | ORAL_TABLET | Freq: Two times a day (BID) | ORAL | 10 refills | Status: DC

## 2017-10-26 MED ORDER — METOPROLOL TARTRATE 25 MG PO TABS: 25.0000 mg | ORAL_TABLET | Freq: Two times a day (BID) | ORAL | 2 refills | Status: DC

## 2017-10-26 MED ORDER — ATORVASTATIN CALCIUM 80 MG PO TABS: 80.0000 mg | ORAL_TABLET | Freq: Every day | ORAL | 0 refills | Status: DC

## 2017-10-26 MED ORDER — ISOSORBIDE MONONITRATE ER 30 MG PO TB24: 30.0000 mg | ORAL_TABLET | Freq: Every day | ORAL | 2 refills | Status: DC

## 2017-10-26 MED ORDER — ASPIRIN 81 MG PO CHEW: 81.0000 mg | CHEWABLE_TABLET | Freq: Every day | ORAL | Status: AC

## 2017-10-26 NOTE — Telephone Encounter (Signed)
Patients insurance is active and benefits verified through [facility] - $30.00 co-pay, no deductible, out of pocket amount of $6,300/$1,027.09 has been met, no co-insurance, and no pre-authorization is required. Passport/reference [phone]  Patient will be contacted and scheduled after their follow up visit with the Cardiologist office upon review by the RN Navigator.

## 2017-10-26 NOTE — Plan of Care (Signed)
  Activity: Risk for activity intolerance will decrease 10/26/2017 0554 - Progressing by Alonna Buckler, RN Note Patient up ad lib in room.    Health Behavior/Discharge Planning: Ability to safely manage health-related needs after discharge will improve 10/26/2017 0554 - Not Progressing by Alonna Buckler, RN Note Pt needs continued education on health risks of smoking. Pt is using nicotine patch while in the hospital, but does not verbalize plans to stop smoking at this time.

## 2017-10-26 NOTE — Progress Notes (Signed)
Progress Note  Patient Name: Curtis Perez Date of Encounter: 10/26/2017  Primary Cardiologist: none, previously Dr. Acie Fredrickson   Subjective   Free of concerns or complaints, says he would like to go home. We had more of a discussion about his DNR status, he is well educated on this decision.   Inpatient Medications    Scheduled Meds: . aspirin  81 mg Oral Daily  . atorvastatin  80 mg Oral q1800  . heparin  5,000 Units Subcutaneous Q8H  . isosorbide mononitrate  30 mg Oral Daily  . losartan  100 mg Oral Daily  . metoprolol tartrate  25 mg Oral Q8H  . nicotine  21 mg Transdermal Daily  . sodium chloride flush  3 mL Intravenous Q12H  . ticagrelor  90 mg Oral BID  . Vitamin D (Ergocalciferol)  50,000 Units Oral Q M,W,F   Continuous Infusions: . sodium chloride    . sodium chloride     PRN Meds: sodium chloride, acetaminophen, ondansetron (ZOFRAN) IV, oxyCODONE-acetaminophen, sodium chloride flush   Vital Signs    Vitals:   10/26/17 0000 10/26/17 0358 10/26/17 0400 10/26/17 0444  BP: 116/82  (!) 95/50 115/74  Pulse: (!) 54  (!) 50   Resp: 16     Temp: (!) 97.3 F (36.3 C) 97.8 F (36.6 C)    TempSrc: Oral Oral    SpO2: 94%  93%   Weight:      Height:        Intake/Output Summary (Last 24 hours) at 10/26/2017 0641 Last data filed at 10/26/2017 0200 Gross per 24 hour  Intake 720 ml  Output -  Net 720 ml   Filed Weights   10/23/17 2049 10/24/17 0258  Weight: 275 lb (124.7 kg) 294 lb 15.6 oz (133.8 kg)    Telemetry    Sinus bradycardia, frequent PVCs - Personally Reviewed  Physical Exam   GEN: Lying in bed watching television, No acute distress.   Neck: No JVD Cardiac: heart sounds are distant and difficulty to appreciate, RRR Respiratory: Clear to auscultation bilaterally. GI: Soft, nontender, non-distended  MS: No edema; No deformity. Neuro:  Nonfocal  Psych: Normal affect   Labs    Chemistry Recent Labs  Lab 10/23/17 2048 10/24/17 0247  10/25/17 0422  NA 130* 128* 131*  K 3.4* 4.4 4.3  CL 96* 99* 99*  CO2 24 21* 26  GLUCOSE 194* 120* 107*  BUN 10 12 <5*  CREATININE 1.37* 1.12 1.01  CALCIUM 8.7* 8.5* 8.4*  PROT  --  5.8*  --   ALBUMIN  --  3.2*  --   AST  --  56*  --   ALT  --  15*  --   ALKPHOS  --  79  --   BILITOT  --  0.7  --   GFRNONAA 53* >60 >60  GFRAA >60 >60 >60  ANIONGAP 10 8 6      Hematology Recent Labs  Lab 10/23/17 2048 10/24/17 0247  WBC 7.2 8.4  RBC 4.92 4.67  HGB 14.2 13.4  HCT 41.1 38.6*  MCV 83.5 82.7  MCH 28.9 28.7  MCHC 34.5 34.7  RDW 13.9 14.0  PLT 219 225    Cardiac Enzymes Recent Labs  Lab 10/23/17 2335 10/24/17 0247  TROPONINI 0.96* 10.04*    Recent Labs  Lab 10/23/17 2057  TROPIPOC 0.02     BNPNo results for input(s): BNP, PROBNP in the last 168 hours.   DDimer No results for input(s):  DDIMER in the last 168 hours.   Radiology    No results found.  Cardiac Studies   Study Conclusions  - Left ventricle: The cavity size was mildly dilated. There was   mild concentric hypertrophy. Systolic function was mildly to   moderately reduced. The estimated ejection fraction was in the   range of 40% to 45%. Akinesis and scarring of the   entireinferolateral and inferior myocardium; consistent with   infarction in the distribution of the right coronary and left   circumflex coronary artery. There was a reduced contribution of   atrial contraction to ventricular filling, due to increased   ventricular diastolic pressure or atrial contractile dysfunction.   Features are consistent with a pseudonormal left ventricular   filling pattern, with concomitant abnormal relaxation and   increased filling pressure (grade 2 diastolic dysfunction). - Mitral valve: There was moderate to severe regurgitation directed   centrally. - Left atrium: The atrium was severely dilated. - Right atrium: The atrium was mildly dilated. - Pulmonary arteries: PA peak pressure: 37 mm Hg  (S).  Cath 11/6  Prox Cx lesion is 99% stenosed. SVG to OM is occluded. Thrombectomy, followed by a drug-eluting stent which was successfully placed using a STENT SYNERGY DES 3.5X20, postdilated to 3.75.  Post intervention, there is a 0% residual stenosis.  Ost 1st Mrg lesion is 70% stenosed. Balloon angioplasty was performed in the ostial vessel before and after circumflex stent placement, using a BALLOON SAPPHIRE 2.5X12.  Post intervention, there is a 20% residual stenosis.  Dist Cx lesion is 100% stenosed. Balloon angioplasty ws attempted but the lesion was too distal in the vessel.  Prox LAD lesion is 90% stenosed. LIMA to LAD is patent.  Prox RCA lesion is 100% stenosed. SVG to RCA is occluded.  LV end diastolic pressure is mildly elevated.  There is no aortic valve stenosis.  Patient Profile     66 y.o. male  with PMH CAD s/p CABG 2013, tobacco abuse, hyperlipidemia and hypertension. He presented with right sided chest soreness associated with diaphoresis and weakness which did not resolve with nitroglycerine and had a trop of 0.96 and EKG with equivocal inferior ST changes given baseline inferior Q waves and prior inferior MI. Reports a similar episode 1 month ago which resolved in a couple of hours. Was placed heparin gtt and IV nitro and taken for emergent cath on 11/6. Cath revealed 99% stenosis of the circumflex and occlusion of the SVG to OM and SVT to RCA. DES was placed in the proximal circumflex, the distal circumflex was 100% stenosed, not amendable to balloon angioplasty.  Assessment & Plan    NSTEMI s/p CABG 2013 with DES placed to the proximal native circumflex with residual occlusion. Echo with EF 40-45%, akinesia of the inferolateral and inferior myocardium, grade 2 diastolic dysfunction, PA pressure 37. Cardiac rehab phase I initiated yesterday. medically managed with aspirin, Brilinta, metoprolol tartrate 25 mg q8h, Isosorbide mononitrate 30 mg, atorvastatin 80 mg  daily and losartan 100 mg daily. Atenolol switched to metoprolol tartrate yesterday, HR in the 50s today but dropped to 40s overnight so third dose of metoprolol was held, will switch to metoprolol tartrate 25 BID. Should be transitioned to [facility] outpatient.   Non sustained vtach - resolved   Type 2 diabetes mellitus-blood glucose is well controlled without insulin   Hypertension-blood pressure is well controlled today losartan, metoprolol tartrate, and isosorbide  Tobacco use disorder- continuing to encourage smoking cessation, he is pre contemplative  but using the nicotine patch while inpatient   Hyperlipidemia - on atorvastatin 80 mg qd, LDL 116, can hold off on adding zetia  back unless his LDL remains uncontrolled at follow up lipid panel in 2-4 weeks.   For questions or updates, please contact [place] Please consult [email or web address] for contact info under Cardiology/STEMI.      Signed, Ledell Noss, MD  10/26/2017, 6:41 AM     Patient seen and examined. Agree with assessment and plan. No recurrent chest pain. No further NSVT. Atenolol was changed to metoprolol, will give 25 mg bid, and will need dose titration as outpatient.  Pt is DNR. He was requesting DC yesterday, but was not stable enough for dc yesterday.  Will ambulate today; plan to dc in afternoon with APP visit next week for probable med titration and F/U with Dr. Cathie Olden.    Troy Sine, MD, [facility] 10/26/2017 9:04 AM

## 2017-10-26 NOTE — Progress Notes (Signed)
CARDIAC REHAB PHASE I   PRE:  Rate/Rhythm: 73 SB  BP:  Sitting: 113/60    MODE:  Ambulation: 740 ft   POST:  Rate/Rhythm: 68 SR  BP:  Sitting: 124/58         SaO2: 98 RA  Pt ambulated 740 ft on RA, independent, steady gait, tolerated well with no complaints. Pt states he has no questions regarding education at this time, hopeful for discharge today. Pt to recliner after walk, call bell within reach.   [id number] Lenna Sciara, RN, BSN 10/26/2017 11:36 AM

## 2017-10-26 NOTE — Discharge Summary (Signed)
Discharge Summary    Patient ID: Curtis Perez,  MRN: [id number], DOB/AGE: [birth year] 66 y.o.  Admit date: 10/23/2017 Discharge date: 10/26/2017  Primary Care Provider: Unk Pinto Primary Cardiologist: Nahser   Discharge Diagnoses    Active Problems:   Hyperlipidemia LDL goal <70   Acute inferior myocardial infarction [facility])   Tobacco abuse counseling   Coronary artery disease involving coronary bypass graft of native heart with unstable angina pectoris (HCC)   Allergies Allergies  Allergen Reactions  . Lisinopril Cough    Diagnostic Studies/Procedures    Study Conclusions  - Left ventricle: The cavity size was mildly dilated. There was mild concentric hypertrophy. Systolic function was mildly to moderately reduced. The estimated ejection fraction was in the range of 40% to 45%. Akinesis and scarring of the entireinferolateral and inferior myocardium; consistent with infarction in the distribution of the right coronary and left circumflex coronary artery. There was a reduced contribution of atrial contraction to ventricular filling, due to increased ventricular diastolic pressure or atrial contractile dysfunction. Features are consistent with a pseudonormal left ventricular filling pattern, with concomitant abnormal relaxation and increased filling pressure (grade 2 diastolic dysfunction). - Mitral valve: There was moderate to severe regurgitation directed centrally. - Left atrium: The atrium was severely dilated. - Right atrium: The atrium was mildly dilated. - Pulmonary arteries: PA peak pressure: 37 mm Hg (S).  Cath 11/6  Prox Cx lesion is 99% stenosed. SVG to OM is occluded. Thrombectomy, followed by a drug-eluting stent which was successfully placed using a STENT SYNERGY DES 3.5X20, postdilated to 3.75.  Post intervention, there is a 0% residual stenosis.  Ost 1st Mrg lesion is 70% stenosed. Balloon angioplasty was performed  in the ostial vessel before and after circumflex stent placement, using a BALLOON SAPPHIRE 2.5X12.  Post intervention, there is a 20% residual stenosis.  Dist Cx lesion is 100% stenosed. Balloon angioplasty ws attempted but the lesion was too distal in the vessel.  Prox LAD lesion is 90% stenosed. LIMA to LAD is patent.  Prox RCA lesion is 100% stenosed. SVG to RCA is occluded.  LV end diastolic pressure is mildly elevated.  There is no aortic valve stenosis.   _____________   History of Present Illness     66 y.o. male with PMH CAD s/p CABG 2013, tobacco abuse, hyperlipidemia and hypertension. He presented with right sided chest soreness associated with diaphoresis and weakness which did not resolve with nitroglycerine and had a trop of 0.96 and EKG with equivocal inferior ST changes given baseline inferior Q waves and prior inferior MI. Reported a similar episode 1 month ago which resolved in a couple of hours. Was placed heparin gtt and IV nitro and taken for emergent cathon 11/6. Cath revealed 99% stenosis of the circumflex and occlusion of the SVG to OM and SVT to RCA. DES was placed in the proximal circumflex, the distal circumflex was 100% stenosed, not amendable to balloon angioplasty.  Hospital Course     NSTEMI s/p CABG 2013 with DES placed to the proximal native circumflex with residual occlusion. Echo with EF 40-45%, akinesia of the inferolateral and inferior myocardium, grade 2 diastolic dysfunction, PA pressure 37. Troponin peaked at 10.04 Cardiac rehab phase I initiated. Medically managed with aspirin, Brilinta, metoprolol tartrate 25 mg BID, Isosorbide mononitrate 30 mg, atorvastatin 80 mg daily and losartan 100 mg daily.    Non sustained vtach - resolved   Type 2 diabetes mellitus-blood glucose is well controlled withoutinsulin  Hypertension-blood pressure is well controlled todaylosartan, metoprolol tartrate, and isosorbide  Tobacco use disorder- continuing  to encourage smoking cessation, he is pre contemplative but using the nicotine patch while inpatient   Hyperlipidemia - on atorvastatin 80 mg qd, LDL 116, can hold off on adding zetia  back unless his LDL remains uncontrolled at follow up lipid panel in 2-4 weeks.   Curtis Perez was seen by Dr. Claiborne Billings and determined stable for discharge home. Follow up in the office has been arranged. Medications are listed below.   _____________  Discharge Vitals Blood pressure (!) 109/59, pulse (!) 50, temperature 97.7 F (36.5 C), temperature source Oral, resp. rate 16, height 6\' 3"  (1.905 m), weight 294 lb 15.6 oz (133.8 kg), SpO2 93 %.  Filed Weights   10/23/17 2049 10/24/17 0258  Weight: 275 lb (124.7 kg) 294 lb 15.6 oz (133.8 kg)    Labs & Radiologic Studies    CBC Recent Labs    10/23/17 2048 10/24/17 0247  WBC 7.2 8.4  NEUTROABS  --  7.2  HGB 14.2 13.4  HCT 41.1 38.6*  MCV 83.5 82.7  PLT 219 376   Basic Metabolic Panel Recent Labs    10/25/17 0422 10/26/17 0754  NA 131* 131*  K 4.3 4.6  CL 99* 99*  CO2 26 25  GLUCOSE 107* 125*  BUN <5* 7  CREATININE 1.01 1.09  CALCIUM 8.4* 8.6*  MG 1.7  --    Liver Function Tests Recent Labs    10/24/17 0247  AST 56*  ALT 15*  ALKPHOS 79  BILITOT 0.7  PROT 5.8*  ALBUMIN 3.2*   No results for input(s): LIPASE, AMYLASE in the last 72 hours. Cardiac Enzymes Recent Labs    10/23/17 2335 10/24/17 0247  TROPONINI 0.96* 10.04*   BNP Invalid input(s): POCBNP D-Dimer No results for input(s): DDIMER in the last 72 hours. Hemoglobin A1C No results for input(s): HGBA1C in the last 72 hours. Fasting Lipid Panel Recent Labs    10/24/17 0247  CHOL 193  HDL 43  LDLCALC 116*  TRIG 171*  CHOLHDL 4.5   Thyroid Function Tests No results for input(s): TSH, T4TOTAL, T3FREE, THYROIDAB in the last 72 hours.  Invalid input(s): FREET3 _____________  Dg Chest 2 View  Result Date: 10/23/2017 CLINICAL DATA:  Profuse sweating earlier  today with weakness; Hx of CABG X 1, cardiac stent placed, Hx of MI, smoker, diabetic EXAM: CHEST  2 VIEW COMPARISON:  08/08/2014 FINDINGS: Status post median sternotomy. The heart is normal in size. There are no focal consolidations or pleural effusions. There is mild streaky atelectasis in the lung bases. No pulmonary edema. IMPRESSION: 1. No pulmonary edema. 2. Mild bibasilar atelectasis. Electronically Signed   By: Nolon Nations M.D.   On: 10/23/2017 21:45   Dg Chest Port 1 View  Result Date: 10/24/2017 CLINICAL DATA:  Status post coronary catheterization EXAM: PORTABLE CHEST 1 VIEW COMPARISON:  10/23/2017 FINDINGS: Cardiac shadow is mildly enlarged but stable. Postsurgical changes are again seen. The lungs are well aerated bilaterally. No focal infiltrate or sizable effusion is noted. No bony abnormality is seen. IMPRESSION: No acute abnormality noted. Electronically Signed   By: Inez Catalina M.D.   On: 10/24/2017 08:48   Disposition   Pt is being discharged home today in good condition.  Follow-up Plans & Appointments    Follow-up Information    [place], Crista [place], [place] Follow up on 11/01/2017.   Specialty:  Cardiology Why:  at 11am for your  follow up appt.  Contact information: [place] [address] [phone]          Discharge Instructions    Amb Referral to Cardiac Rehabilitation   Complete by:  As directed    Diagnosis:   STEMI PTCA Coronary Stents     Call MD for:  redness, tenderness, or signs of infection (pain, swelling, redness, odor or green/yellow discharge around incision site)   Complete by:  As directed    Diet - low sodium heart healthy   Complete by:  As directed    Discharge instructions   Complete by:  As directed    Radial Site Care Refer to this sheet in the next few weeks. These instructions provide you with information on caring for yourself after your procedure. Your caregiver may also give you more specific  instructions. Your treatment has been planned according to current medical practices, but problems sometimes occur. Call your caregiver if you have any problems or questions after your procedure. HOME CARE INSTRUCTIONS You may shower the day after the procedure.Remove the bandage (dressing) and gently wash the site with plain soap and water.Gently pat the site dry.  Do not apply powder or lotion to the site.  Do not submerge the affected site in water for 3 to 5 days.  Inspect the site at least twice daily.  Do not flex or bend the affected arm for 24 hours.  No lifting over 5 pounds (2.3 kg) for 5 days after your procedure.  Do not drive home if you are discharged the same day of the procedure. Have someone else drive you.  You may drive 24 hours after the procedure unless otherwise instructed by your caregiver.  What to expect: Any bruising will usually fade within 1 to 2 weeks.  Blood that collects in the tissue (hematoma) may be painful to the touch. It should usually decrease in size and tenderness within 1 to 2 weeks.  SEEK IMMEDIATE MEDICAL CARE IF: You have unusual pain at the radial site.  You have redness, warmth, swelling, or pain at the radial site.  You have drainage (other than a small amount of blood on the dressing).  You have chills.  You have a fever or persistent symptoms for more than 72 hours.  You have a fever and your symptoms suddenly get worse.  Your arm becomes pale, cool, tingly, or numb.  You have heavy bleeding from the site. Hold pressure on the site.   PLEASE DO NOT MISS ANY DOSES OF YOUR BRILINTA!!!!! Also keep a log of you blood pressures and bring back to your follow up appt. Please call the office with any questions.   Patients taking blood thinners should generally stay away from medicines like ibuprofen, Advil, Motrin, naproxen, and Aleve due to risk of stomach bleeding. You may take Tylenol as directed or talk to your primary doctor about  alternatives.   Increase activity slowly   Complete by:  As directed       Discharge Medications     Medication List    STOP taking these medications   aspirin EC 325 MG tablet Replaced by:  aspirin 81 MG chewable tablet   atenolol 50 MG tablet Commonly known as:  TENORMIN     TAKE these medications   aspirin 81 MG chewable tablet Chew 1 tablet (81 mg total) daily by mouth. Start taking on:  10/27/2017 Replaces:  aspirin EC 325 MG tablet   atorvastatin 80  MG tablet Commonly known as:  LIPITOR Take 1 tablet (80 mg total) daily at 6 PM by mouth.   ezetimibe 10 MG tablet Commonly known as:  ZETIA TAKE 1 TABLET BY MOUTH EVERY DAY FOR CHOLESTEROL What changed:    how much to take  how to take this  when to take this  additional instructions   FISH OIL PO Take 2,000 mg by mouth daily.   FLUoxetine 40 MG capsule Commonly known as:  PROZAC TAKE 1 CAPSULE DAILY FOR MOOD What changed:    how much to take  how to take this  when to take this  additional instructions   IRON PO Take 65 mg (of elemental iron) every other day   isosorbide mononitrate 30 MG 24 hr tablet Commonly known as:  IMDUR Take 1 tablet (30 mg total) daily by mouth. Start taking on:  10/27/2017   levothyroxine 100 MCG tablet Commonly known as:  SYNTHROID, LEVOTHROID TAKE 1 TABLET (100 MCG TOTAL) BY MOUTH DAILY BEFORE BREAKFAST. What changed:  See the new instructions.   losartan 100 MG tablet Commonly known as:  COZAAR TAKE 1/2 TO 1 TABLET DAILY FOR BLOOD PRESSURE What changed:    how much to take  how to take this  when to take this  additional instructions   Magnesium 400 MG Caps Take 400 mg 4 (four) times daily by mouth.   metFORMIN 500 MG 24 hr tablet Commonly known as:  GLUCOPHAGE-XR TAKE 1 TABLET WITH BREAKFAST AND LUNCH & 2 TABLETS AT SUPPER FOR DIABETES What changed:  See the new instructions.   metoprolol tartrate 25 MG tablet Commonly known as:   LOPRESSOR Take 1 tablet (25 mg total) 2 (two) times daily by mouth.   nitroGLYCERIN 0.4 MG SL tablet Commonly known as:  NITROSTAT Place 1 tablet (0.4 mg total) every 5 (five) minutes as needed under the tongue.   omeprazole 40 MG capsule Commonly known as:  PRILOSEC Take 1 capsule daily for acid Indigestion & Reflux What changed:    how much to take  how to take this  when to take this  additional instructions   Potassium 75 MG Tabs Take 75 mg daily by mouth.   ticagrelor 90 MG Tabs tablet Commonly known as:  BRILINTA Take 1 tablet (90 mg total) 2 (two) times daily by mouth.   VITAMIN B-12 PO Take 1,000 mcg by mouth daily.   Vitamin D (Ergocalciferol) 50000 units Caps capsule Commonly known as:  DRISDOL Take 1 capsule (50,000 Units total) by mouth 3 (three) times a week. What changed:  when to take this        Aspirin prescribed at discharge?  Yes High Intensity Statin Prescribed? (Lipitor 40-80mg  or Crestor 20-40mg ): Yes Beta Blocker Prescribed? Yes For EF <40%, was ACEI/ARB Prescribed? Yes ADP Receptor Inhibitor Prescribed? (i.e. Plavix etc.-Includes Medically Managed Patients): Yes For EF <40%, Aldosterone Inhibitor Prescribed? No: EF 45% Was EF assessed during THIS hospitalization? Yes Was Cardiac Rehab II ordered? (Included Medically managed Patients): Yes   Outstanding Labs/Studies   FLP/LFTs in 6 weeks.   Duration of Discharge Encounter   Greater than 30 minutes including physician time.  Signed, Reino Bellis NP-C 10/26/2017, 12:48 PM

## 2017-10-31 NOTE — Progress Notes (Signed)
Cardiology Office Note    Date:  11/01/2017   ID:  Erma, Joubert [birth year], MRN [id number]  PCP:  Unk Pinto, MD  Cardiologist:  Dr. Acie Fredrickson  Chief Complaint: Hospital follow up for NSTEMI  History of Present Illness:   Curtis Perez is a 66 y.o. male with PMH of  PMH CAD s/p CABG 2013, tobacco abuse, hyperlipidemia and hypertension presents for follow up.   He presented with right sided chest soreness 10/23/17 associated with diaphoresis and weakness which did not resolve with nitroglycerine and had a trop of 0.96 and EKG with equivocal inferior ST changes given baseline inferior Q waves and prior inferior MI. Reported a similar episode 1 month ago which resolved in a couple of hours. Was placed heparin gtt and IV nitro and taken for emergent cathon 11/6. Cath revealed 99% stenosis of the circumflex and occlusion of the SVG to OM and SVG to RCA. DES was placed in the proximal circumflex, the distal circumflex was 100% stenosed, not amendable to balloon angioplasty.Echo with EF 40-45%, akinesia of the inferolateral and inferior myocardium, grade 2 diastolic dysfunction, PA pressure 37. Troponin peaked at 10.04 Cardiac rehab phase I initiated. Medically managed with aspirin, Brilinta,metoprolol tartrate 25 mg BID, Isosorbide mononitrate30mg ,atorvastatin 80 mg daily and losartan 100 mg daily.  Here today for follow-up.  He continues to smoke.  Does not interested in quitting.  He is eats lot of salt and fatty food.  He has had dyspnea on exertion without chest pain.  Improving since PCI.  Yesterday he has noted swelling at right groin cath site.  He does have a pain while pending returning.  No melena, blood in his stool or urine, orthopnea, PND, syncope, melena.   Past Medical History:  Diagnosis Date  . Adenomatous colon polyp 02/15/2009  . Bradycardia   . CAD (coronary artery disease)    remote MI in 2004 with PCI; s/p CABG x 3 03/2012  . Depression   . Family history of  malignant neoplasm of gastrointestinal tract   . GERD (gastroesophageal reflux disease)   . Hyperlipidemia   . Hypertension   . Hypothyroidism   . Myocardial infarction ([place])    2004  . Obesity   . Prediabetes   . 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 Tobacco abuse 03/28/2012  . Vitamin D deficiency     Past Surgical History:  Procedure Laterality Date  . BACK SURGERY    . COLONOSCOPY  02/15/2009  . CORONARY ANGIOPLASTY WITH STENT PLACEMENT     about 8 years ago at [facility]  . CORONARY ARTERY BYPASS GRAFT  04/02/2012   Procedure: CORONARY ARTERY BYPASS GRAFTING (CABG);  Surgeon: Rexene Alberts, MD;  Location: [facility];  Service: Open Heart Surgery;  Laterality: N/A;  On pump, times three graphs using endoscopically harvested right greater saphenous vein and left internal mammary artery.   [facility] ACUTE MI REVASCULARIZATION N/A 10/23/2017   Procedure: Coronary/Graft Acute MI Revascularization;  Surgeon: Jettie Booze, MD;  Location: [facility];  Service: Cardiovascular;  Laterality: N/A;  . KNEE SURGERY    . LEFT HEART CATH AND CORS/GRAFTS ANGIOGRAPHY N/A 10/23/2017   Procedure: LEFT HEART CATH AND CORS/GRAFTS ANGIOGRAPHY;  Surgeon: Jettie Booze, MD;  Location: [facility];  Service: Cardiovascular;  Laterality: N/A;  . TONSILLECTOMY      Current Medications:  Prior to Admission medications   Medication Sig Start Date End Date Taking? Authorizing Provider  aspirin 81 MG chewable tablet Chew 1 tablet (81 mg total) daily by mouth. 10/27/17   Cheryln Manly, NP  atorvastatin (LIPITOR) 80 MG tablet Take 1 tablet (80 mg total) daily at 6 PM by mouth. 10/26/17   Cheryln Manly, NP  Cyanocobalamin (VITAMIN B-12 PO) Take 1,000 mcg by mouth daily.     [provider]  ezetimibe (ZETIA) 10 MG tablet TAKE 1 TABLET BY MOUTH EVERY DAY FOR CHOLESTEROL Patient taking differently: Take  10 mg daily by mouth. FOR CHOLESTEROL 07/10/16   Unk Pinto, MD  FLUoxetine (PROZAC) 40 MG capsule TAKE 1 CAPSULE DAILY FOR MOOD Patient taking differently: Take 40 mg daily by mouth. FOR MOOD 07/15/17   Vicie Mutters, PA-C  IRON PO Take 65 mg (of elemental iron) every other day    [provider]  isosorbide mononitrate (IMDUR) 30 MG 24 hr tablet Take 1 tablet (30 mg total) daily by mouth. 10/27/17   Cheryln Manly, NP  levothyroxine (SYNTHROID, LEVOTHROID) 100 MCG tablet TAKE 1 TABLET (100 MCG TOTAL) BY MOUTH DAILY BEFORE BREAKFAST. Patient taking differently: Take 100 mcg by mouth once a day before breakfast 09/06/17   Unk Pinto, MD  losartan (COZAAR) 100 MG tablet TAKE 1/2 TO 1 TABLET DAILY FOR BLOOD PRESSURE Patient taking differently: Take 100 mg daily by mouth. FOR BLOOD PRESSURE 05/21/17   Vicie Mutters, PA-C  Magnesium 400 MG CAPS Take 400 mg 4 (four) times daily by mouth.     [provider]  metFORMIN (GLUCOPHAGE-XR) 500 MG 24 hr tablet TAKE 1 TABLET WITH BREAKFAST AND LUNCH & 2 TABLETS AT SUPPER FOR DIABETES Patient taking differently: Take 1,000 mg by mouth two times a day 07/29/17   Unk Pinto, MD  metoprolol tartrate (LOPRESSOR) 25 MG tablet Take 1 tablet (25 mg total) 2 (two) times daily by mouth. 10/26/17   Cheryln Manly, NP  nitroGLYCERIN (NITROSTAT) 0.4 MG SL tablet Place 1 tablet (0.4 mg total) every 5 (five) minutes as needed under the tongue. 10/26/17   Cheryln Manly, NP  Omega-3 Fatty Acids (FISH OIL PO) Take 2,000 mg by mouth daily.    [provider]  omeprazole (PRILOSEC) 40 MG capsule Take 1 capsule daily for acid Indigestion & Reflux Patient taking differently: Take 40 mg daily by mouth.  08/07/17 03/07/18  Unk Pinto, MD  Potassium 75 MG TABS Take 75 mg daily by mouth.     [provider]  ticagrelor (BRILINTA) 90 MG TABS tablet Take 1 tablet (90 mg total) 2 (two) times daily by mouth. 10/26/17    Cheryln Manly, NP  Vitamin D, Ergocalciferol, (DRISDOL) 50000 UNITS CAPS capsule Take 1 capsule (50,000 Units total) by mouth 3 (three) times a week. Patient taking differently: Take 50,000 Units every Monday, Wednesday, and Friday by mouth.  09/23/15   Forcucci, Loma Sousa, PA-C    Allergies:   Lisinopril   Social History   Socioeconomic History  . Marital status: Divorced    Spouse name: None  . Number of children: 0  . Years of education: None  . Highest education level: None  Social Needs  . Financial resource strain: Not very hard  . Food insecurity - worry: Never true  . Food insecurity - inability: Never true  . Transportation needs - medical: No  . Transportation needs - non-medical: No  Occupational History  . Occupation: Disability  .  Occupation: Counsellor for [employer]  Tobacco Use  . Smoking status: Current Every Day Smoker    Packs/day: 2.00    Years: 40.00    Pack years: 80.00    Types: Cigarettes  . Smokeless tobacco: Never Used  . Tobacco comment: down from 2ppd  Substance and Sexual Activity  . Alcohol use: No    Alcohol/week: 1.2 oz    Types: 1 Glasses of wine, 1 Shots of liquor per week    Comment: Rarely  . Drug use: No  . Sexual activity: Not Currently  Other Topics Concern  . None  Social History Narrative  . None     Family History:  The patient's family history includes Alzheimer's disease in his mother; Colon cancer in his father and paternal grandmother; Hypertension in his mother.   ROS:   Please see the history of present illness.    ROS All other systems reviewed and are negative.   PHYSICAL EXAM:   VS:  BP 128/76   Pulse 70   Ht 6\' 3"  (1.905 m)   Wt 262 lb 8 oz (119.1 kg)   SpO2 97%   BMI 32.81 kg/m    GEN: Well nourished, well developed, in no acute distress  HEENT: normal  Neck: no JVD, carotid bruits, or masses Cardiac: RRR; no murmurs, rubs, or gallops.  Trace bilateral lower extremity edema.  Patient has a large  hematoma at right femoral cath site with extensive ecchymosis extending to right lower back as well as left groin area. Respiratory:  clear to auscultation bilaterally, normal work of breathing GI: soft, nontender, nondistended, + BS MS: no deformity or atrophy  Skin: warm and dry, no rash Neuro:  Alert and Oriented x 3, Strength and sensation are intact Psych: euthymic mood, full affect  Wt Readings from Last 3 Encounters:  11/01/17 262 lb 8 oz (119.1 kg)  10/24/17 294 lb 15.6 oz (133.8 kg)  08/13/17 270 lb (122.5 kg)      Studies/Labs Reviewed:   EKG:  EKG is ordered today.  The ekg ordered today demonstrates sinus rhythm with PACs and PVCs  Recent Labs: 08/13/2017: TSH 1.56 10/24/2017: ALT 15; Hemoglobin 13.4; Platelets 225 10/25/2017: Magnesium 1.7 10/26/2017: BUN 7; Creatinine, Ser 1.09; Potassium 4.6; Sodium 131   Lipid Panel    Component Value Date/Time   CHOL 193 10/24/2017 0247   TRIG 171 (H) 10/24/2017 0247   HDL 43 10/24/2017 0247   CHOLHDL 4.5 10/24/2017 0247   VLDL 34 10/24/2017 0247   LDLCALC 116 (H) 10/24/2017 0247    Additional studies/ records that were reviewed today include:   Echocardiogram: 10/25/17 Study Conclusions  - Left ventricle: The cavity size was mildly dilated. There was   mild concentric hypertrophy. Systolic function was mildly to   moderately reduced. The estimated ejection fraction was in the   range of 40% to 45%. Akinesis and scarring of the   entireinferolateral and inferior myocardium; consistent with   infarction in the distribution of the right coronary and left   circumflex coronary artery. There was a reduced contribution of   atrial contraction to ventricular filling, due to increased   ventricular diastolic pressure or atrial contractile dysfunction.   Features are consistent with a pseudonormal left ventricular   filling pattern, with concomitant abnormal relaxation and   increased filling pressure (grade 2 diastolic  dysfunction). - Mitral valve: There was moderate to severe regurgitation directed   centrally. - Left atrium: The atrium was severely dilated. -  Right atrium: The atrium was mildly dilated. - Pulmonary arteries: PA peak pressure: 37 mm Hg (S).   Cardiac Catheterization: 10/24/17  Prox Cx lesion is 99% stenosed. SVG to OM is occluded. Thrombectomy, followed by a drug-eluting stent which was successfully placed using a STENT SYNERGY DES 3.5X20, postdilated to 3.75.  Post intervention, there is a 0% residual stenosis.  Ost 1st Mrg lesion is 70% stenosed. Balloon angioplasty was performed in the ostial vessel before and after circumflex stent placement, using a BALLOON SAPPHIRE 2.5X12.  Post intervention, there is a 20% residual stenosis.  Dist Cx lesion is 100% stenosed. Balloon angioplasty ws attempted but the lesion was too distal in the vessel.  Prox LAD lesion is 90% stenosed. LIMA to LAD is patent.  Prox RCA lesion is 100% stenosed. SVG to RCA is occluded.  LV end diastolic pressure is mildly elevated.  There is no aortic valve stenosis.   Continue angiomax for another hour.  He will need dual antiplatelet therapy for at least a year, along with aggressive secondary prevention.    Watch in ICU.  Diagnostic Diagram       Post-Intervention Diagram          ASSESSMENT & PLAN:    1. CAD s/p CABG 2013; Thrombectomy and DES to the proximal native circumflex and angioplasty to ost 1st Marg 10/2017  - Detailed cath as above.  No chest pain.  Dyspnea on exertion improving.  Continue aspirin, Brilinta, Lipitor, Imdur, Lopressor and losartan.  2. HTN -Stable and well controlled on current regimen.  3. HLD - 10/24/2017: Cholesterol 193; HDL 43; LDL Cholesterol 116; Triglycerides 171; VLDL 34  - LFT and lipid panel in 4-6 weeks  4. Tobacco abuse - Advised cessation.  He is not interested in quitting tobacco smoking.  5. DM - Per PCP  6.  Chronic combined  CHF -Trace bilateral lower extremity edema.  Lungs clear.  Denies orthopnea or PND.  As needed Lasix.  Advised to cut back on his salt intake.  7.  Right groin hematoma -No bruit noted.  He has huge hematoma with extensive ecchymosis radiating to right lower back and left groin area.  No documentation regarding hematoma during hospitalization.  Patient just noted yesterday.  Also having pain.  Will check CBC.  Get CT scan to rule out retroperitoneal bleed.  Advised to go to the emergency room if worsening pain or swelling.  Limit weight as described below.  Disposition.  Seems patient had poor insight on his health condition.  He is not interested in smoking sensation or dietary changes.  He is not going to enroll in cardiac rehab as well.  Discussed importance of heart healthy diet, weight loss, regular exercise in detail.  Education given for greater than 20 minutes.  Medication Adjustments/Labs and Tests Ordered: Current medicines are reviewed at length with the patient today.  Concerns regarding medicines are outlined above.  Medication changes, Labs and Tests ordered today are listed in the Patient Instructions below. Patient Instructions  Medication Instructions:  1. A PRESCRIPTION FOR LASIX 20 MG HAS BEEN SENT IN; YOU WILL ONLY TAKE THE LASIX 20 MG AS NEEDED IF YOU HAVE EXCESS SWELLING, INCREASED SHORTNESS OF BREATH, OR INCREASED WEIGHT GAIN OF 3 LB'S IN 1 DAY;   IF YOU HAVE TO TAKE THE LASIX MORE THAN 3 TIMES IN 1 WEEK PLEASE CALL THE OFFICE TO LET THE DOCTOR KNOW AS TO WE MAY NEED TO MAKE AN ADJUSTMENT [phone]  Labwork: 1. TODAY  CBC  2. IN 1 MONTH YOU WILL NEED FASTING LIPID AND LIVER PANEL TO BE DONE  Testing/Procedures: 1. YOU NEED TO SCHEDULE AN ABDOMINAL, PELVIS CT; R/O RETROPERITONEAL BLEEDING ; SEE IF THIS CAN BE DONE TOMORROW 11/02/17 PER PA    Follow-Up: 1. DR. Acie Fredrickson IN 3 MONTH   2. DR. Acie Fredrickson CARE TEAM IN 2 WEEK FOR A FOLLOW UP   Any Other Special Instructions  Will Be Listed Below (If Applicable). If you need a refill on your cardiac medications before your next appointment, please call your pharmacy.   Low-Sodium Eating Plan Sodium, which is an element that makes up salt, helps you maintain a healthy balance of fluids in your body. Too much sodium can increase your blood pressure and cause fluid and waste to be held in your body. Your health care provider or dietitian may recommend following this plan if you have high blood pressure (hypertension), kidney disease, liver disease, or heart failure. Eating less sodium can help lower your blood pressure, reduce swelling, and protect your heart, liver, and kidneys. What are tips for following this plan? General guidelines  Most people on this plan should limit their sodium intake to 1,500-2,000 mg (milligrams) of sodium each day. Reading food labels  The Nutrition Facts label lists the amount of sodium in one serving of the food. If you eat more than one serving, you must multiply the listed amount of sodium by the number of servings.  Choose foods with less than 140 mg of sodium per serving.  Avoid foods with 300 mg of sodium or more per serving. Shopping  Look for lower-sodium products, often labeled as "low-sodium" or "no salt added."  Always check the sodium content even if foods are labeled as "unsalted" or "no salt added".  Buy fresh foods. ? Avoid canned foods and premade or frozen meals. ? Avoid canned, cured, or processed meats  Buy breads that have less than 80 mg of sodium per slice. Cooking  Eat more home-cooked food and less restaurant, buffet, and fast food.  Avoid adding salt when cooking. Use salt-free seasonings or herbs instead of table salt or sea salt. Check with your health care provider or pharmacist before using salt substitutes.  Cook with plant-based oils, such as canola, sunflower, or olive oil. Meal planning  When eating at a restaurant, ask that your food be  prepared with less salt or no salt, if possible.  Avoid foods that contain MSG (monosodium glutamate). MSG is sometimes added to [place] food, bouillon, and some canned foods. What foods are recommended? The items listed may not be a complete list. Talk with your dietitian about what dietary choices are best for you. Grains Low-sodium cereals, including oats, puffed wheat and rice, and shredded wheat. Low-sodium crackers. Unsalted rice. Unsalted pasta. Low-sodium bread. Whole-grain breads and whole-grain pasta. Vegetables Fresh or frozen vegetables. "No salt added" canned vegetables. "No salt added" tomato sauce and paste. Low-sodium or reduced-sodium tomato and vegetable juice. Fruits Fresh, frozen, or canned fruit. Fruit juice. Meats and other protein foods Fresh or frozen (no salt added) meat, poultry, seafood, and fish. Low-sodium canned tuna and salmon. Unsalted nuts. Dried peas, beans, and lentils without added salt. Unsalted canned beans. Eggs. Unsalted nut butters. Dairy Milk. Soy milk. Cheese that is naturally low in sodium, such as ricotta cheese, fresh mozzarella, or Swiss cheese Low-sodium or reduced-sodium cheese. Cream cheese. Yogurt. Fats and oils Unsalted butter. Unsalted margarine with no trans fat. Vegetable oils such as canola or  olive oils. Seasonings and other foods Fresh and dried herbs and spices. Salt-free seasonings. Low-sodium mustard and ketchup. Sodium-free salad dressing. Sodium-free light mayonnaise. Fresh or refrigerated horseradish. Lemon juice. Vinegar. Homemade, reduced-sodium, or low-sodium soups. Unsalted popcorn and pretzels. Low-salt or salt-free chips. What foods are not recommended? The items listed may not be a complete list. Talk with your dietitian about what dietary choices are best for you. Grains Instant hot cereals. Bread stuffing, pancake, and biscuit mixes. Croutons. Seasoned rice or pasta mixes. Noodle soup cups. Boxed or frozen macaroni and  cheese. Regular salted crackers. Self-rising flour. Vegetables Sauerkraut, pickled vegetables, and relishes. Olives. [place] fries. Onion rings. Regular canned vegetables (not low-sodium or reduced-sodium). Regular canned tomato sauce and paste (not low-sodium or reduced-sodium). Regular tomato and vegetable juice (not low-sodium or reduced-sodium). Frozen vegetables in sauces. Meats and other protein foods Meat or fish that is salted, canned, smoked, spiced, or pickled. Bacon, ham, sausage, hotdogs, corned beef, chipped beef, packaged lunch meats, salt pork, jerky, pickled herring, anchovies, regular canned tuna, sardines, salted nuts. Dairy Processed cheese and cheese spreads. Cheese curds. Blue cheese. Feta cheese. String cheese. Regular cottage cheese. Buttermilk. Canned milk. Fats and oils Salted butter. Regular margarine. Ghee. Bacon fat. Seasonings and other foods Onion salt, garlic salt, seasoned salt, table salt, and sea salt. Canned and packaged gravies. Worcestershire sauce. Tartar sauce. Barbecue sauce. Teriyaki sauce. Soy sauce, including reduced-sodium. Steak sauce. Fish sauce. Oyster sauce. Cocktail sauce. Horseradish that you find on the shelf. Regular ketchup and mustard. Meat flavorings and tenderizers. Bouillon cubes. Hot sauce and Tabasco sauce. Premade or packaged marinades. Premade or packaged taco seasonings. Relishes. Regular salad dressings. Salsa. Potato and tortilla chips. Corn chips and puffs. Salted popcorn and pretzels. Canned or dried soups. Pizza. Frozen entrees and pot pies. Summary  Eating less sodium can help lower your blood pressure, reduce swelling, and protect your heart, liver, and kidneys.  Most people on this plan should limit their sodium intake to 1,500-2,000 mg (milligrams) of sodium each day.  Canned, boxed, and frozen foods are high in sodium. Restaurant foods, fast foods, and pizza are also very high in sodium. You also get sodium by adding salt to  food.  Try to cook at home, eat more fresh fruits and vegetables, and eat less fast food, canned, processed, or prepared foods. This information is not intended to replace advice given to you by your health care provider. Make sure you discuss any questions you have with your health care provider. Document Released: 05/26/2002 Document Revised: 11/27/2016 Document Reviewed: 11/27/2016 Elsevier Interactive Patient Education  2017 [place].    Heart Failure Heart failure means your heart has trouble pumping blood. This makes it hard for your body to work well. Heart failure is usually a long-term (chronic) condition. You must take good care of yourself and follow your doctor's treatment plan. Follow these instructions at home:  Take your heart medicine as told by your doctor. ? Do not stop taking medicine unless your doctor tells you to. ? Do not skip any dose of medicine. ? Refill your medicines before they run out. ? Take other medicines only as told by your doctor or pharmacist.  Stay active if told by your doctor. The elderly and people with severe heart failure should talk with a doctor about physical activity.  Eat heart-healthy foods. Choose foods that are without trans fat and are low in saturated fat, cholesterol, and salt (sodium). This includes fresh or frozen fruits and vegetables,  fish, lean meats, fat-free or low-fat dairy foods, whole grains, and high-fiber foods. Lentils and dried peas and beans (legumes) are also good choices.  Limit salt if told by your doctor.  Cook in a healthy way. Roast, grill, broil, bake, poach, steam, or stir-fry foods.  Limit fluids as told by your doctor.  Weigh yourself every morning. Do this after you pee (urinate) and before you eat breakfast. Write down your weight to give to your doctor.  Take your blood pressure and write it down if your doctor tells you to.  Ask your doctor how to check your pulse. Check your pulse as  told.  Lose weight if told by your doctor.  Stop smoking or chewing tobacco. Do not use gum or patches that help you quit without your doctor's approval.  Schedule and go to doctor visits as told.  Nonpregnant women should have no more than 1 drink a day. Men should have no more than 2 drinks a day. Talk to your doctor about drinking alcohol.  Stop illegal drug use.  Stay current with shots (immunizations).  Manage your health conditions as told by your doctor.  Learn to manage your stress.  Rest when you are tired.  If it is really hot outside: ? Avoid intense activities. ? Use air conditioning or fans, or get in a cooler place. ? Avoid caffeine and alcohol. ? Wear loose-fitting, lightweight, and light-colored clothing.  If it is really cold outside: ? Avoid intense activities. ? Layer your clothing. ? Wear mittens or gloves, a hat, and a scarf when going outside. ? Avoid alcohol.  Learn about heart failure and get support as needed.  Get help to maintain or improve your quality of life and your ability to care for yourself as needed. Contact a doctor if:  You gain weight quickly.  You are more short of breath than usual.  You cannot do your normal activities.  You tire easily.  You cough more than normal, especially with activity.  You have any or more puffiness (swelling) in areas such as your hands, feet, ankles, or belly (abdomen).  You cannot sleep because it is hard to breathe.  You feel like your heart is beating fast (palpitations).  You get dizzy or light-headed when you stand up. Get help right away if:  You have trouble breathing.  There is a change in mental status, such as becoming less alert or not being able to focus.  You have chest pain or discomfort.  You faint. This information is not intended to replace advice given to you by your health care provider. Make sure you discuss any questions you have with your health care  provider. Document Released: 09/12/2008 Document Revised: 05/11/2016 Document Reviewed: 01/20/2013 Elsevier Interactive Patient Education  2017 [place], [place], [place]  11/01/2017 12:06 PM    [address], [place], [place]  [address] Phone: [phone]; Fax: [phone]

## 2017-11-01 ENCOUNTER — Telehealth: Payer: Self-pay | Admitting: *Deleted

## 2017-11-01 ENCOUNTER — Encounter: Payer: Self-pay | Admitting: Physician Assistant

## 2017-11-01 ENCOUNTER — Ambulatory Visit: Payer: 59 | Admitting: Physician Assistant

## 2017-11-01 VITALS — BP 128/76 | HR 70 | Ht 75.0 in | Wt 262.5 lb

## 2017-11-01 DIAGNOSIS — E785 Hyperlipidemia, unspecified: Secondary | ICD-10-CM

## 2017-11-01 DIAGNOSIS — I5042 Chronic combined systolic (congestive) and diastolic (congestive) heart failure: Secondary | ICD-10-CM | POA: Diagnosis not present

## 2017-11-01 DIAGNOSIS — Z720 Tobacco use: Secondary | ICD-10-CM

## 2017-11-01 DIAGNOSIS — I25708 Atherosclerosis of coronary artery bypass graft(s), unspecified, with other forms of angina pectoris: Secondary | ICD-10-CM | POA: Diagnosis not present

## 2017-11-01 DIAGNOSIS — T148XXA Other injury of unspecified body region, initial encounter: Secondary | ICD-10-CM | POA: Diagnosis not present

## 2017-11-01 DIAGNOSIS — I10 Essential (primary) hypertension: Secondary | ICD-10-CM | POA: Diagnosis not present

## 2017-11-01 LAB — CBC
Hematocrit: 39.7 % (ref 37.5–51.0)
Hemoglobin: 12.9 g/dL — ABNORMAL LOW (ref 13.0–17.7)
MCH: 28.2 pg (ref 26.6–33.0)
MCHC: 32.5 g/dL (ref 31.5–35.7)
MCV: 87 fL (ref 79–97)
Platelets: 342 10*3/uL (ref 150–379)
RBC: 4.57 x10E6/uL (ref 4.14–5.80)
RDW: 15.2 % (ref 12.3–15.4)
WBC: 8.4 10*3/uL (ref 3.4–10.8)

## 2017-11-01 MED ORDER — FUROSEMIDE 20 MG PO TABS: 20.0000 mg | ORAL_TABLET | ORAL | 6 refills | Status: DC | PRN

## 2017-11-01 NOTE — Telephone Encounter (Signed)
-----   Message from [place], [place] sent at 11/01/2017  4:07 PM EST ----- Hgb stable.

## 2017-11-01 NOTE — Telephone Encounter (Signed)
Left message to go over lab results. I did also lmom per Stacy in our CT Dept that they have been trying to reach pt today to s/w him about his upcoming CT tomorrow. I lmtcb first thing tomorrow morning and s/w the CT Dept. Lab results have been sent to [place].

## 2017-11-01 NOTE — Patient Instructions (Addendum)
Medication Instructions:  1. A PRESCRIPTION FOR LASIX 20 MG HAS BEEN SENT IN; YOU WILL ONLY TAKE THE LASIX 20 MG AS NEEDED IF YOU HAVE EXCESS SWELLING, INCREASED SHORTNESS OF BREATH, OR INCREASED WEIGHT GAIN OF 3 LB'S IN 1 DAY;   IF YOU HAVE TO TAKE THE LASIX MORE THAN 3 TIMES IN 1 WEEK PLEASE CALL THE OFFICE TO LET THE DOCTOR KNOW AS TO WE MAY NEED TO MAKE AN ADJUSTMENT [phone]  Labwork: 1. TODAY CBC  2. IN 1 MONTH YOU WILL NEED FASTING LIPID AND LIVER PANEL TO BE DONE  Testing/Procedures: 1. YOU NEED TO SCHEDULE AN ABDOMINAL, PELVIS CT; R/O RETROPERITONEAL BLEEDING ; SEE IF THIS CAN BE DONE TOMORROW 11/02/17 PER PA    Follow-Up: 1. DR. Acie Fredrickson IN 3 MONTH   2. DR. Acie Fredrickson CARE TEAM IN 2 WEEK FOR A FOLLOW UP   Any Other Special Instructions Will Be Listed Below (If Applicable). If you need a refill on your cardiac medications before your next appointment, please call your pharmacy.   Low-Sodium Eating Plan Sodium, which is an element that makes up salt, helps you maintain a healthy balance of fluids in your body. Too much sodium can increase your blood pressure and cause fluid and waste to be held in your body. Your health care provider or dietitian may recommend following this plan if you have high blood pressure (hypertension), kidney disease, liver disease, or heart failure. Eating less sodium can help lower your blood pressure, reduce swelling, and protect your heart, liver, and kidneys. What are tips for following this plan? General guidelines  Most people on this plan should limit their sodium intake to 1,500-2,000 mg (milligrams) of sodium each day. Reading food labels  The Nutrition Facts label lists the amount of sodium in one serving of the food. If you eat more than one serving, you must multiply the listed amount of sodium by the number of servings.  Choose foods with less than 140 mg of sodium per serving.  Avoid foods with 300 mg of sodium or more per  serving. Shopping  Look for lower-sodium products, often labeled as "low-sodium" or "no salt added."  Always check the sodium content even if foods are labeled as "unsalted" or "no salt added".  Buy fresh foods. ? Avoid canned foods and premade or frozen meals. ? Avoid canned, cured, or processed meats  Buy breads that have less than 80 mg of sodium per slice. Cooking  Eat more home-cooked food and less restaurant, buffet, and fast food.  Avoid adding salt when cooking. Use salt-free seasonings or herbs instead of table salt or sea salt. Check with your health care provider or pharmacist before using salt substitutes.  Cook with plant-based oils, such as canola, sunflower, or olive oil. Meal planning  When eating at a restaurant, ask that your food be prepared with less salt or no salt, if possible.  Avoid foods that contain MSG (monosodium glutamate). MSG is sometimes added to [place] food, bouillon, and some canned foods. What foods are recommended? The items listed may not be a complete list. Talk with your dietitian about what dietary choices are best for you. Grains Low-sodium cereals, including oats, puffed wheat and rice, and shredded wheat. Low-sodium crackers. Unsalted rice. Unsalted pasta. Low-sodium bread. Whole-grain breads and whole-grain pasta. Vegetables Fresh or frozen vegetables. "No salt added" canned vegetables. "No salt added" tomato sauce and paste. Low-sodium or reduced-sodium tomato and vegetable juice. Fruits Fresh, frozen, or canned fruit. Fruit juice. Meats  and other protein foods Fresh or frozen (no salt added) meat, poultry, seafood, and fish. Low-sodium canned tuna and salmon. Unsalted nuts. Dried peas, beans, and lentils without added salt. Unsalted canned beans. Eggs. Unsalted nut butters. Dairy Milk. Soy milk. Cheese that is naturally low in sodium, such as ricotta cheese, fresh mozzarella, or Swiss cheese Low-sodium or reduced-sodium cheese. Cream  cheese. Yogurt. Fats and oils Unsalted butter. Unsalted margarine with no trans fat. Vegetable oils such as canola or olive oils. Seasonings and other foods Fresh and dried herbs and spices. Salt-free seasonings. Low-sodium mustard and ketchup. Sodium-free salad dressing. Sodium-free light mayonnaise. Fresh or refrigerated horseradish. Lemon juice. Vinegar. Homemade, reduced-sodium, or low-sodium soups. Unsalted popcorn and pretzels. Low-salt or salt-free chips. What foods are not recommended? The items listed may not be a complete list. Talk with your dietitian about what dietary choices are best for you. Grains Instant hot cereals. Bread stuffing, pancake, and biscuit mixes. Croutons. Seasoned rice or pasta mixes. Noodle soup cups. Boxed or frozen macaroni and cheese. Regular salted crackers. Self-rising flour. Vegetables Sauerkraut, pickled vegetables, and relishes. Olives. [place] fries. Onion rings. Regular canned vegetables (not low-sodium or reduced-sodium). Regular canned tomato sauce and paste (not low-sodium or reduced-sodium). Regular tomato and vegetable juice (not low-sodium or reduced-sodium). Frozen vegetables in sauces. Meats and other protein foods Meat or fish that is salted, canned, smoked, spiced, or pickled. Bacon, ham, sausage, hotdogs, corned beef, chipped beef, packaged lunch meats, salt pork, jerky, pickled herring, anchovies, regular canned tuna, sardines, salted nuts. Dairy Processed cheese and cheese spreads. Cheese curds. Blue cheese. Feta cheese. String cheese. Regular cottage cheese. Buttermilk. Canned milk. Fats and oils Salted butter. Regular margarine. Ghee. Bacon fat. Seasonings and other foods Onion salt, garlic salt, seasoned salt, table salt, and sea salt. Canned and packaged gravies. Worcestershire sauce. Tartar sauce. Barbecue sauce. Teriyaki sauce. Soy sauce, including reduced-sodium. Steak sauce. Fish sauce. Oyster sauce. Cocktail sauce. Horseradish that you  find on the shelf. Regular ketchup and mustard. Meat flavorings and tenderizers. Bouillon cubes. Hot sauce and Tabasco sauce. Premade or packaged marinades. Premade or packaged taco seasonings. Relishes. Regular salad dressings. Salsa. Potato and tortilla chips. Corn chips and puffs. Salted popcorn and pretzels. Canned or dried soups. Pizza. Frozen entrees and pot pies. Summary  Eating less sodium can help lower your blood pressure, reduce swelling, and protect your heart, liver, and kidneys.  Most people on this plan should limit their sodium intake to 1,500-2,000 mg (milligrams) of sodium each day.  Canned, boxed, and frozen foods are high in sodium. Restaurant foods, fast foods, and pizza are also very high in sodium. You also get sodium by adding salt to food.  Try to cook at home, eat more fresh fruits and vegetables, and eat less fast food, canned, processed, or prepared foods. This information is not intended to replace advice given to you by your health care provider. Make sure you discuss any questions you have with your health care provider. Document Released: 05/26/2002 Document Revised: 11/27/2016 Document Reviewed: 11/27/2016 Elsevier Interactive Patient Education  2017 [employer].    Heart Failure Heart failure means your heart has trouble pumping blood. This makes it hard for your body to work well. Heart failure is usually a long-term (chronic) condition. You must take good care of yourself and follow your doctor's treatment plan. Follow these instructions at home:  Take your heart medicine as told by your doctor. ? Do not stop taking medicine unless your doctor tells you to. ?  Do not skip any dose of medicine. ? Refill your medicines before they run out. ? Take other medicines only as told by your doctor or pharmacist.  Stay active if told by your doctor. The elderly and people with severe heart failure should talk with a doctor about physical activity.  Eat  heart-healthy foods. Choose foods that are without trans fat and are low in saturated fat, cholesterol, and salt (sodium). This includes fresh or frozen fruits and vegetables, fish, lean meats, fat-free or low-fat dairy foods, whole grains, and high-fiber foods. Lentils and dried peas and beans (legumes) are also good choices.  Limit salt if told by your doctor.  Cook in a healthy way. Roast, grill, broil, bake, poach, steam, or stir-fry foods.  Limit fluids as told by your doctor.  Weigh yourself every morning. Do this after you pee (urinate) and before you eat breakfast. Write down your weight to give to your doctor.  Take your blood pressure and write it down if your doctor tells you to.  Ask your doctor how to check your pulse. Check your pulse as told.  Lose weight if told by your doctor.  Stop smoking or chewing tobacco. Do not use gum or patches that help you quit without your doctor's approval.  Schedule and go to doctor visits as told.  Nonpregnant women should have no more than 1 drink a day. Men should have no more than 2 drinks a day. Talk to your doctor about drinking alcohol.  Stop illegal drug use.  Stay current with shots (immunizations).  Manage your health conditions as told by your doctor.  Learn to manage your stress.  Rest when you are tired.  If it is really hot outside: ? Avoid intense activities. ? Use air conditioning or fans, or get in a cooler place. ? Avoid caffeine and alcohol. ? Wear loose-fitting, lightweight, and light-colored clothing.  If it is really cold outside: ? Avoid intense activities. ? Layer your clothing. ? Wear mittens or gloves, a hat, and a scarf when going outside. ? Avoid alcohol.  Learn about heart failure and get support as needed.  Get help to maintain or improve your quality of life and your ability to care for yourself as needed. Contact a doctor if:  You gain weight quickly.  You are more short of breath than  usual.  You cannot do your normal activities.  You tire easily.  You cough more than normal, especially with activity.  You have any or more puffiness (swelling) in areas such as your hands, feet, ankles, or belly (abdomen).  You cannot sleep because it is hard to breathe.  You feel like your heart is beating fast (palpitations).  You get dizzy or light-headed when you stand up. Get help right away if:  You have trouble breathing.  There is a change in mental status, such as becoming less alert or not being able to focus.  You have chest pain or discomfort.  You faint. This information is not intended to replace advice given to you by your health care provider. Make sure you discuss any questions you have with your health care provider. Document Released: 09/12/2008 Document Revised: 05/11/2016 Document Reviewed: 01/20/2013 Elsevier Interactive Patient Education  2017 [employer].

## 2017-11-02 ENCOUNTER — Inpatient Hospital Stay: Admission: RE | Admit: 2017-11-02 | Payer: 59 | Source: Ambulatory Visit

## 2017-11-16 ENCOUNTER — Ambulatory Visit: Payer: 59 | Admitting: Cardiology

## 2017-11-16 ENCOUNTER — Other Ambulatory Visit: Payer: Self-pay

## 2017-11-16 ENCOUNTER — Emergency Department (HOSPITAL_COMMUNITY)
Admission: EM | Admit: 2017-11-16 | Discharge: 2017-11-16 | Disposition: A | Discharge status: Home / Self Care | Payer: 59 | Attending: Emergency Medicine | Admitting: Emergency Medicine

## 2017-11-16 ENCOUNTER — Encounter (HOSPITAL_COMMUNITY): Payer: Self-pay | Admitting: Emergency Medicine

## 2017-11-16 ENCOUNTER — Emergency Department (HOSPITAL_COMMUNITY): Payer: 59

## 2017-11-16 DIAGNOSIS — I252 Old myocardial infarction: Secondary | ICD-10-CM | POA: Insufficient documentation

## 2017-11-16 DIAGNOSIS — I5021 Acute systolic (congestive) heart failure: Secondary | ICD-10-CM | POA: Insufficient documentation

## 2017-11-16 DIAGNOSIS — Z955 Presence of coronary angioplasty implant and graft: Secondary | ICD-10-CM | POA: Diagnosis not present

## 2017-11-16 DIAGNOSIS — Z7984 Long term (current) use of oral hypoglycemic drugs: Secondary | ICD-10-CM | POA: Diagnosis not present

## 2017-11-16 DIAGNOSIS — Z7982 Long term (current) use of aspirin: Secondary | ICD-10-CM | POA: Diagnosis not present

## 2017-11-16 DIAGNOSIS — Z951 Presence of aortocoronary bypass graft: Secondary | ICD-10-CM | POA: Diagnosis not present

## 2017-11-16 DIAGNOSIS — E039 Hypothyroidism, unspecified: Secondary | ICD-10-CM | POA: Diagnosis not present

## 2017-11-16 DIAGNOSIS — R0602 Shortness of breath: Secondary | ICD-10-CM | POA: Diagnosis present

## 2017-11-16 DIAGNOSIS — Z79899 Other long term (current) drug therapy: Secondary | ICD-10-CM | POA: Insufficient documentation

## 2017-11-16 DIAGNOSIS — I2510 Atherosclerotic heart disease of native coronary artery without angina pectoris: Secondary | ICD-10-CM | POA: Diagnosis not present

## 2017-11-16 DIAGNOSIS — I10 Essential (primary) hypertension: Secondary | ICD-10-CM | POA: Diagnosis not present

## 2017-11-16 DIAGNOSIS — F17210 Nicotine dependence, cigarettes, uncomplicated: Secondary | ICD-10-CM | POA: Diagnosis not present

## 2017-11-16 DIAGNOSIS — Z720 Tobacco use: Secondary | ICD-10-CM

## 2017-11-16 DIAGNOSIS — R0600 Dyspnea, unspecified: Secondary | ICD-10-CM | POA: Diagnosis not present

## 2017-11-16 LAB — BASIC METABOLIC PANEL
Anion gap: 10 (ref 5–15)
BUN: 10 mg/dL (ref 6–20)
CO2: 23 mmol/L (ref 22–32)
Calcium: 8.7 mg/dL — ABNORMAL LOW (ref 8.9–10.3)
Chloride: 102 mmol/L (ref 101–111)
Creatinine, Ser: 1.36 mg/dL — ABNORMAL HIGH (ref 0.61–1.24)
GFR calc Af Amer: >60 mL/min (ref >60)
GFR calc non Af Amer: 54 mL/min — ABNORMAL LOW (ref >60)
Glucose, Bld: 161 mg/dL — ABNORMAL HIGH (ref 65–99)
Potassium: 3.8 mmol/L (ref 3.5–5.1)
Sodium: 135 mmol/L (ref 135–145)

## 2017-11-16 LAB — CBC
HCT: 39.7 % (ref 39.0–52.0)
Hemoglobin: 13.3 g/dL (ref 13.0–17.0)
MCH: 28.8 pg (ref 26.0–34.0)
MCHC: 33.5 g/dL (ref 30.0–36.0)
MCV: 85.9 fL (ref 78.0–100.0)
Platelets: 255 10*3/uL (ref 150–400)
RBC: 4.62 MIL/uL (ref 4.22–5.81)
RDW: 14.4 % (ref 11.5–15.5)
WBC: 12.8 10*3/uL — ABNORMAL HIGH (ref 4.0–10.5)

## 2017-11-16 LAB — I-STAT TROPONIN, ED: Troponin i, poc: 0.05 ng/mL (ref 0.00–0.08)

## 2017-11-16 MED ORDER — ALBUTEROL SULFATE (2.5 MG/3ML) 0.083% IN NEBU
5.0000 mg | INHALATION_SOLUTION | Freq: Once | RESPIRATORY_TRACT | Status: AC
  Administered 2017-11-16: 5 mg via RESPIRATORY_TRACT
  Filled 2017-11-16: qty 6

## 2017-11-16 MED ORDER — FUROSEMIDE 20 MG PO TABS: 20.0000 mg | ORAL_TABLET | Freq: Two times a day (BID) | ORAL | 0 refills | Status: DC

## 2017-11-16 MED ORDER — POTASSIUM CHLORIDE CRYS ER 20 MEQ PO TBCR: 20.0000 meq | EXTENDED_RELEASE_TABLET | Freq: Every day | ORAL | 0 refills | Status: DC

## 2017-11-16 NOTE — ED Notes (Signed)
Patient transported to X-ray 

## 2017-11-16 NOTE — ED Triage Notes (Signed)
PT reports SOB twice in the last week. First episode was Monday. PT reports he believes he gets bloated prior to episode of SOB. PT reports SOB started again at 4am. PT had a STEMI 3 weeks ago. PT was 94% on room air at EMS arrival. PT denies chest pain. Denies NVD

## 2017-11-16 NOTE — Discharge Instructions (Signed)
Began taking Lasix, twice a day, and potassium once a day, to improve your breathing trouble.  It is important to stop smoking to improve your cardiac function.  Follow-up with your cardiologist within 1 or 2 weeks for further evaluation and treatment.  They will need to recheck your blood work to make sure your potassium level is normal since you are on an increased dose of Lasix.

## 2017-11-16 NOTE — ED Notes (Signed)
Care handoff to Crystal, RN 

## 2017-11-16 NOTE — ED Notes (Signed)
Resp at bedside

## 2017-11-16 NOTE — ED Provider Notes (Signed)
[facility] EMERGENCY DEPARTMENT Provider Note   CSN: [id number] Arrival date & time: 11/16/17  4098     History   Chief Complaint Chief Complaint  Patient presents with  . Shortness of Breath    HPI Curtis Perez is a 66 y.o. male.  He is here for evaluation of 2 episodes of nocturnal dyspnea, while lying supine sleeping.  He has taken a Lasix pill each time for "fluid."  He denies chest pain, nausea, vomiting, diaphoresis, shortness of breath, weakness or dizziness.  Recently hospitalized, had STEMI and cardiac catheterization with stenting.  He continues to smoke cigarettes.  He is taking his medications, as prescribed.  There are no other known modifying factors.  HPI  Past Medical History:  Diagnosis Date  . Adenomatous colon polyp 02/15/2009  . Bradycardia   . CAD (coronary artery disease)    remote MI in 2004 with PCI; s/p CABG x 3 03/2012  . Depression   . Family history of malignant neoplasm of gastrointestinal tract   . GERD (gastroesophageal reflux disease)   . Hyperlipidemia   . Hypertension   . Hypothyroidism   . Myocardial infarction ([place])    2004  . Obesity   . Prediabetes   . 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 Tobacco abuse 03/28/2012  . Vitamin D deficiency     Patient Active Problem List   Diagnosis Date Noted  . Acute inferior myocardial infarction ([place]) 10/24/2017  . Tobacco abuse counseling   . Coronary artery disease involving coronary bypass graft of native heart with unstable angina pectoris ([place])   . History of MI (myocardial infarction) 05/20/2017  . Rectus sheath hematoma 08/08/2014  . Medication management 03/30/2014  . Coronary atherosclerosis 12/25/2013  . 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 GERD (gastroesophageal reflux disease)   . Vitamin D deficiency   . Obesity   . 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 Hypertension 03/28/2012  . Tobacco abuse 03/28/2012  .  Hyperlipidemia LDL goal <70 03/28/2012  . COLONIC POLYPS, ADENOMATOUS, HX OF 02/19/2009    Past Surgical History:  Procedure Laterality Date  . BACK SURGERY    . COLONOSCOPY  02/15/2009  . CORONARY ANGIOPLASTY WITH STENT PLACEMENT     about 8 years ago at [facility]  . CORONARY ARTERY BYPASS GRAFT  04/02/2012   Procedure: CORONARY ARTERY BYPASS GRAFTING (CABG);  Surgeon: Rexene Alberts, MD;  Location: [facility];  Service: Open Heart Surgery;  Laterality: N/A;  On pump, times three graphs using endoscopically harvested right greater saphenous vein and left internal mammary artery.   [facility] ACUTE MI REVASCULARIZATION N/A 10/23/2017   Procedure: Coronary/Graft Acute MI Revascularization;  Surgeon: Jettie Booze, MD;  Location: [place] [facility];  Service: Cardiovascular;  Laterality: N/A;  . KNEE SURGERY    . LEFT HEART CATH AND CORS/GRAFTS ANGIOGRAPHY N/A 10/23/2017   Procedure: LEFT HEART CATH AND CORS/GRAFTS ANGIOGRAPHY;  Surgeon: Jettie Booze, MD;  Location: [place] [facility];  Service: Cardiovascular;  Laterality: N/A;  . TONSILLECTOMY         Home Medications    Prior to Admission medications   Medication Sig Start Date End Date Taking? Authorizing Provider  aspirin 81 MG chewable tablet Chew 1 tablet (81 mg total) daily by mouth. 10/27/17  Yes Cheryln Manly, NP  atorvastatin (LIPITOR)  80 MG tablet Take 1 tablet (80 mg total) daily at 6 PM by mouth. 10/26/17  Yes Reino Bellis B, NP  Cyanocobalamin (VITAMIN B-12 PO) Take 1,000 mcg by mouth daily.    Yes [provider]  ezetimibe (ZETIA) 10 MG tablet TAKE 1 TABLET BY MOUTH EVERY DAY FOR CHOLESTEROL Patient taking differently: Take 10 mg daily by mouth. FOR CHOLESTEROL 07/10/16  Yes Unk Pinto, MD  FLUoxetine (PROZAC) 40 MG capsule TAKE 1 CAPSULE DAILY FOR MOOD Patient taking differently: Take 40 mg daily by mouth. FOR MOOD 07/15/17  Yes Vicie Mutters, PA-C  isosorbide mononitrate  (IMDUR) 30 MG 24 hr tablet Take 1 tablet (30 mg total) daily by mouth. 10/27/17  Yes Reino Bellis B, NP  levothyroxine (SYNTHROID, LEVOTHROID) 100 MCG tablet TAKE 1 TABLET (100 MCG TOTAL) BY MOUTH DAILY BEFORE BREAKFAST. Patient taking differently: Take 100 mcg by mouth once a day before breakfast 09/06/17  Yes Unk Pinto, MD  losartan (COZAAR) 100 MG tablet TAKE 1/2 TO 1 TABLET DAILY FOR BLOOD PRESSURE Patient taking differently: Take 100 mg daily by mouth. FOR BLOOD PRESSURE 05/21/17  Yes Vicie Mutters, PA-C  Magnesium 400 MG CAPS Take 400 mg 4 (four) times daily by mouth.    Yes [provider]  metFORMIN (GLUCOPHAGE-XR) 500 MG 24 hr tablet TAKE 1 TABLET WITH BREAKFAST AND LUNCH & 2 TABLETS AT SUPPER FOR DIABETES Patient taking differently: Take 1,000 mg by mouth two times a day 07/29/17  Yes Unk Pinto, MD  metoprolol tartrate (LOPRESSOR) 25 MG tablet Take 1 tablet (25 mg total) 2 (two) times daily by mouth. 10/26/17  Yes Cheryln Manly, NP  nitroGLYCERIN (NITROSTAT) 0.4 MG SL tablet Place 1 tablet (0.4 mg total) every 5 (five) minutes as needed under the tongue. 10/26/17  Yes Cheryln Manly, NP  Omega-3 Fatty Acids (FISH OIL PO) Take 2,000 mg by mouth daily.   Yes [provider]  omeprazole (PRILOSEC) 40 MG capsule Take 1 capsule daily for acid Indigestion & Reflux Patient taking differently: Take 40 mg daily by mouth.  08/07/17 03/07/18 Yes Unk Pinto, MD  ticagrelor (BRILINTA) 90 MG TABS tablet Take 1 tablet (90 mg total) 2 (two) times daily by mouth. 10/26/17  Yes Cheryln Manly, NP  Vitamin D, Ergocalciferol, (DRISDOL) 50000 UNITS CAPS capsule Take 1 capsule (50,000 Units total) by mouth 3 (three) times a week. Patient taking differently: Take 50,000 Units by mouth every 7 (seven) days.  09/23/15  Yes Forcucci, Courtney, PA-C  furosemide (LASIX) 20 MG tablet Take 1 tablet (20 mg total) by mouth 2 (two) times daily. 11/16/17   Daleen Bo, MD    IRON PO Take 65 mg (of elemental iron) every other day    [provider]  potassium chloride SA (K-DUR,KLOR-CON) 20 MEQ tablet Take 1 tablet (20 mEq total) by mouth daily. 11/16/17   Daleen Bo, MD    Family History Family History  Problem Relation Age of Onset  . Hypertension Mother   . Alzheimer's disease Mother   . Colon cancer Father   . Colon cancer Paternal Grandmother   . Esophageal cancer Neg Hx   . Rectal cancer Neg Hx   . Stomach cancer Neg Hx     Social History Social History   Tobacco Use  . Smoking status: Current Every Day Smoker    Packs/day: 2.00    Years: 40.00    Pack years: 80.00    Types: Cigarettes  . Smokeless tobacco: Never  Used  . Tobacco comment: down from 2ppd  Substance Use Topics  . Alcohol use: No    Alcohol/week: 1.2 oz    Types: 1 Glasses of wine, 1 Shots of liquor per week    Comment: Rarely  . Drug use: No     Allergies   Lisinopril   Review of Systems Review of Systems  All other systems reviewed and are negative.    Physical Exam Updated Vital Signs BP 137/65   Pulse (!) 51   Temp (!) 97.4 F (36.3 C) (Oral)   Resp 16   Ht 6\' 3"  (1.905 m)   Wt 122.9 kg (271 lb)   SpO2 95%   BMI 33.87 kg/m   Physical Exam  Constitutional: He is oriented to person, place, and time. He appears well-developed and well-nourished. He does not appear ill.  HENT:  Head: Normocephalic and atraumatic.  Right Ear: External ear normal.  Left Ear: External ear normal.  Eyes: Conjunctivae and EOM are normal. Pupils are equal, round, and reactive to light.  Neck: Normal range of motion and phonation normal. Neck supple.  Cardiovascular: Normal rate, regular rhythm and normal heart sounds.  Pulmonary/Chest: Effort normal and breath sounds normal. He exhibits no bony tenderness.  Abdominal: Soft. There is no tenderness.  Musculoskeletal: Normal range of motion.       Right lower leg: He exhibits edema.       Left lower leg: He  exhibits edema.  1+ lower extremity edema bilaterally  Neurological: He is alert and oriented to person, place, and time. No cranial nerve deficit or sensory deficit. He exhibits normal muscle tone. Coordination normal.  Skin: Skin is warm, dry and intact.  Psychiatric: He has a normal mood and affect. His behavior is normal. Judgment and thought content normal.  Nursing note and vitals reviewed.    ED Treatments / Results  Labs (all labs ordered are listed, but only abnormal results are displayed) Labs Reviewed  BASIC METABOLIC PANEL - Abnormal; Notable for the following components:      Result Value   Glucose, Bld 161 (*)    Creatinine, Ser 1.36 (*)    Calcium 8.7 (*)    GFR calc non Af Amer 54 (*)    All other components within normal limits  CBC - Abnormal; Notable for the following components:   WBC 12.8 (*)    All other components within normal limits  I-STAT TROPONIN, ED    EKG  EKG Interpretation  Date/Time:  Friday November 16 2017 08:34:28 EST Ventricular Rate:  66 PR Interval:    QRS Duration: 117 QT Interval:  424 QTC Calculation: 445 R Axis:   48 Text Interpretation:  Sinus rhythm Ventricular premature complex Consider left atrial enlargement Nonspecific intraventricular conduction delay Inferior infarct, old since last tracing no significant change Confirmed by Daleen Bo [phone]) on 11/16/2017 9:26:56 AM       Radiology Dg Chest 2 View  Result Date: 11/16/2017 CLINICAL DATA:  Shortness of breath since last week. Cannot take a deep breath. EXAM: CHEST  2 VIEW COMPARISON:  10/24/2017 FINDINGS: Bilateral diffuse mild interstitial thickening. No focal consolidation, pleural effusion or pneumothorax. Stable cardiomegaly. Prior CABG. No acute osseous abnormality. IMPRESSION: Findings most concerning for mild CHF. Electronically Signed   By: Kathreen Devoid   On: 11/16/2017 09:49    Procedures Procedures (including critical care time)  Medications Ordered in  ED Medications  albuterol (PROVENTIL) (2.5 MG/3ML) 0.083% nebulizer solution 5 mg (5  mg Nebulization Given 11/16/17 0853)     Initial Impression / Assessment and Plan / ED Course  I have reviewed the triage vital signs and the nursing notes.  Pertinent labs & imaging results that were available during my care of the patient were reviewed by me and considered in my medical decision making (see chart for details).      Patient Vitals for the past 24 hrs:  BP Temp Temp src Pulse Resp SpO2 Height Weight  11/16/17 1130 137/65 - - (!) 51 16 95 % - -  11/16/17 1045 124/71 - - (!) 43 16 95 % - -  11/16/17 1030 (!) 123/57 - - (!) 51 (!) 25 96 % - -  11/16/17 1000 127/75 - - (!) 57 (!) 23 96 % - -  11/16/17 0915 (!) 146/64 - - (!) 42 19 96 % - -  11/16/17 0900 (!) 144/103 - - (!) 59 16 100 % - -  11/16/17 0853 - - - - - 97 % - -  11/16/17 0843 (!) 159/89 (!) 97.4 F (36.3 C) Oral 62 19 98 % 6\' 3"  (1.905 m) 122.9 kg (271 lb)  11/16/17 0838 - - - - - 94 % - -    At discharge- reevaluation with update and discussion. After initial assessment and treatment, an updated evaluation reveals he remains comfortable and has no further complaints.  Findings discussed with the patient and all questions were answered. Daleen Bo     Final Clinical Impressions(s) / ED Diagnoses   Final diagnoses:  Acute systolic congestive heart failure ([place])  Tobacco abuse    Nursing Notes Reviewed/ Care Coordinated Applicable Imaging Reviewed Interpretation of Laboratory Data incorporated into ED treatment  The patient appears reasonably screened and/or stabilized for discharge and I doubt any other medical condition or other [facility] requiring further screening, evaluation, or treatment in the ED at this time prior to discharge.  Plan: Home Medications-increase Lasix to twice daily, daily, take potassium daily, continue other routine medications; Home Treatments-rest, low-salt diet; return here if the  recommended treatment, does not improve the symptoms; Recommended follow up-cardiology follow-up 1-2 weeks.   ED Discharge Orders        Ordered    furosemide (LASIX) 20 MG tablet  2 times daily     11/16/17 1127    potassium chloride SA (K-DUR,KLOR-CON) 20 MEQ tablet  Daily     11/16/17 1128       Daleen Bo, MD 11/16/17 1537

## 2017-11-18 ENCOUNTER — Encounter: Payer: Self-pay | Admitting: Adult Health

## 2017-11-18 DIAGNOSIS — I5022 Chronic systolic (congestive) heart failure: Secondary | ICD-10-CM | POA: Insufficient documentation

## 2017-11-18 DIAGNOSIS — I5042 Chronic combined systolic (congestive) and diastolic (congestive) heart failure: Secondary | ICD-10-CM | POA: Insufficient documentation

## 2017-11-18 DIAGNOSIS — E039 Hypothyroidism, unspecified: Secondary | ICD-10-CM | POA: Insufficient documentation

## 2017-11-18 NOTE — Progress Notes (Deleted)
FOLLOW UP  Assessment and Plan:   Diagnoses and all orders for this visit:  Acute inferior myocardial infarction [facility]) S/p recent STEMI with cath 10/24/2017 Reviewed plan from cardiology, resolving today  Coronary artery disease involving coronary bypass graft of native heart with unstable angina pectoris ([place]) Medically managed with aspirin, Brilinta,metoprolol tartrate 25 mgBID, Isosorbide mononitrate30mg ,atorvastatin 80 mg daily and losartan 100 mg daily. Continue close monitoring and aggressive treatment of htn, hyperlipidemia (LDL goal <70), T2DM - strongly encouraging to quit tobacco use  Chronic combined systolic and diastolic heart failure ([place]) New dx; Disease process and medications discussed. Questions answered fully. Emphasized salt restriction, less than 2000mg  a day. Encouraged daily monitoring of the patient's weight, call office if 5 lb weight loss or gain in a day.  Encouraged regular exercise. If any increasing shortness of breath, swelling, or chest pressure go to ER immediately.  decrease your fluid intake to less than 2 L daily please remember to always increase your potassium intake with any increase of your fluid pill.   Hypertension Well controlled with current medications Monitor blood pressure at home; patient to call if consistently greater than 130/80 Continue DASH diet.   Reminder to go to the ER if any CP, SOB, nausea, dizziness, severe HA, changes vision/speech, left arm numbness and tingling and jaw pain.  Cholesterol Continue medication  Continue low cholesterol diet and exercise.  Check lipid panel.   Diabetes with diabetic chronic kidney disease Continue medication: Continue diet and exercise.  Perform daily foot/skin check, notify office of any concerning changes.  Check A1C  Tobacco use Discussed risks associated with tobacco use and advised to reduce or quit Patient is not ready to do so, but advised to consider strongly Will  follow up at the next visit  Vitamin D Def/ osteoporosis prevention Continue supplementation Check Vit D level  Hypothyroidism continue medications the same reminded to take on an empty stomach 30-31mins before food.  check TSH level  Continue diet and meds as discussed. Further disposition pending results of labs. Discussed med's effects and SE's.   Over 30 minutes of exam, counseling, chart review, and critical decision making was performed.   Future Appointments  Date Time Provider [place]  11/19/2017  2:00 PM Liane Comber, NP [facility] None  11/30/2017  8:30 AM [facility]  02/11/2018  9:20 AM Nahser, Wonda Cheng, MD [facility]  03/04/2018  2:30 PM Unk Pinto, MD [facility] None  09/11/2018  2:00 PM Unk Pinto, MD [facility] None    ----------------------------------------------------------------------------------------------------------------------  HPI 66 y.o. male for presents for 3 month follow up; he is recently s/p acute inferior myocardial infarction/STEMI with cath on 10/19/2017 with hx of remote MI in 2004 with PCI; CABG x 3 03/2012. Echo on 10/25/2017 also showed EF 46-27%, systolic function mild/moderately reduced, grade 2 diastolic dysfunction.   He was discharged with close cardiology follow up with medical management plan with aspirin, Brilinta,metoprolol tartrate 25 mgBID, Isosorbide mononitrate30mg ,atorvastatin 80 mg daily and losartan 100 mg daily. He is followed followed by this office for hypertension, cholesterol, diabetes, hypothyroid, continue tobacco use and vitamin D deficiency.   He has a history of Combined Systolic and Diastolic, denies {OJJKKXFGHWE:99371::"IRCVELF on exertion","orthopnea","paroxysmal nocturnal dyspnea","edema"}. Positive for {CARDIAC SYMPTOMS:12860}. Wt Readings from Last 3 Encounters:  11/16/17 271 lb (122.9 kg)  11/01/17 262 lb 8 oz (119.1 kg)  10/24/17 294 lb 15.6 oz  (133.8 kg)   he currently continues to smoke *** pack a day; discussed risks associated  with smoking, patient {ACTION; IS/IS IWP:80998338} ready to quit.   His blood pressure {HAS HAS NOT:18834} been controlled at home, today their BP is    He {DOES_DOES SNK:53976} workout. He denies chest pain, shortness of breath, dizziness.   He is on cholesterol medication and denies myalgias. His cholesterol is not at goal. The cholesterol last visit was:   Lab Results  Component Value Date   CHOL 193 10/24/2017   HDL 43 10/24/2017   LDLCALC 116 (H) 10/24/2017   TRIG 171 (H) 10/24/2017   CHOLHDL 4.5 10/24/2017    He {Has/has not:18111} been working on diet and exercise for prediabetes, and denies {Symptoms; diabetes w/o none:19199}. Last A1C in the office was:  Lab Results  Component Value Date   HGBA1C 6.0 (H) 08/13/2017   He is on thyroid medication. His medication was not changed last visit.   Lab Results  Component Value Date   TSH 1.56 08/13/2017   Patient is on Vitamin D supplement and at goal:   Lab Results  Component Value Date   VD25OH 89 08/13/2017        Current Medications:  Current Outpatient Medications on File Prior to Visit  Medication Sig  . aspirin 81 MG chewable tablet Chew 1 tablet (81 mg total) daily by mouth.  Marland Kitchen atorvastatin (LIPITOR) 80 MG tablet Take 1 tablet (80 mg total) daily at 6 PM by mouth.  . Cyanocobalamin (VITAMIN B-12 PO) Take 1,000 mcg by mouth daily.   Marland Kitchen ezetimibe (ZETIA) 10 MG tablet TAKE 1 TABLET BY MOUTH EVERY DAY FOR CHOLESTEROL (Patient taking differently: Take 10 mg daily by mouth. FOR CHOLESTEROL)  . FLUoxetine (PROZAC) 40 MG capsule TAKE 1 CAPSULE DAILY FOR MOOD (Patient taking differently: Take 40 mg daily by mouth. FOR MOOD)  . furosemide (LASIX) 20 MG tablet Take 1 tablet (20 mg total) by mouth 2 (two) times daily.  . IRON PO Take 65 mg (of elemental iron) every other day  . isosorbide mononitrate (IMDUR) 30 MG 24 hr tablet Take 1 tablet  (30 mg total) daily by mouth.  . levothyroxine (SYNTHROID, LEVOTHROID) 100 MCG tablet TAKE 1 TABLET (100 MCG TOTAL) BY MOUTH DAILY BEFORE BREAKFAST. (Patient taking differently: Take 100 mcg by mouth once a day before breakfast)  . losartan (COZAAR) 100 MG tablet TAKE 1/2 TO 1 TABLET DAILY FOR BLOOD PRESSURE (Patient taking differently: Take 100 mg daily by mouth. FOR BLOOD PRESSURE)  . Magnesium 400 MG CAPS Take 400 mg 4 (four) times daily by mouth.   . metFORMIN (GLUCOPHAGE-XR) 500 MG 24 hr tablet TAKE 1 TABLET WITH BREAKFAST AND LUNCH & 2 TABLETS AT SUPPER FOR DIABETES (Patient taking differently: Take 1,000 mg by mouth two times a day)  . metoprolol tartrate (LOPRESSOR) 25 MG tablet Take 1 tablet (25 mg total) 2 (two) times daily by mouth.  . nitroGLYCERIN (NITROSTAT) 0.4 MG SL tablet Place 1 tablet (0.4 mg total) every 5 (five) minutes as needed under the tongue.  . Omega-3 Fatty Acids (FISH OIL PO) Take 2,000 mg by mouth daily.  Marland Kitchen omeprazole (PRILOSEC) 40 MG capsule Take 1 capsule daily for acid Indigestion & Reflux (Patient taking differently: Take 40 mg daily by mouth. )  . potassium chloride SA (K-DUR,KLOR-CON) 20 MEQ tablet Take 1 tablet (20 mEq total) by mouth daily.  . ticagrelor (BRILINTA) 90 MG TABS tablet Take 1 tablet (90 mg total) 2 (two) times daily by mouth.  . Vitamin D, Ergocalciferol, (DRISDOL) 50000 UNITS CAPS capsule  Take 1 capsule (50,000 Units total) by mouth 3 (three) times a week. (Patient taking differently: Take 50,000 Units by mouth every 7 (seven) days. )   No current facility-administered medications on file prior to visit.      Allergies:  Allergies  Allergen Reactions  . Lisinopril Cough     Medical History:  Past Medical History:  Diagnosis Date  . Adenomatous colon polyp 02/15/2009  . Bradycardia   . CAD (coronary artery disease)    remote MI in 2004 with PCI; s/p CABG x 3 03/2012  . Depression   . Family history of malignant neoplasm of  gastrointestinal tract   . GERD (gastroesophageal reflux disease)   . Hyperlipidemia   . Hypertension   . Hypothyroidism   . Myocardial infarction ([place])    2004  . Obesity   . Prediabetes   . 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 Tobacco abuse 03/28/2012  . Vitamin D deficiency    Family history- Reviewed and unchanged Social history- Reviewed and unchanged   Review of Systems:  ROS    Physical Exam: There were no vitals taken for this visit. Wt Readings from Last 3 Encounters:  11/16/17 271 lb (122.9 kg)  11/01/17 262 lb 8 oz (119.1 kg)  10/24/17 294 lb 15.6 oz (133.8 kg)   General Appearance: Well nourished, in no apparent distress. Eyes: PERRLA, EOMs, conjunctiva no swelling or erythema Sinuses: No Frontal/maxillary tenderness ENT/Mouth: Ext aud canals clear, TMs without erythema, bulging. No erythema, swelling, or exudate on post pharynx.  Tonsils not swollen or erythematous. Hearing normal.  Neck: Supple, thyroid normal.  Respiratory: Respiratory effort normal, BS equal bilaterally without rales, rhonchi, wheezing or stridor.  Cardio: RRR with no MRGs. Brisk peripheral pulses without edema.  Abdomen: Soft, + BS.  Non tender, no guarding, rebound, hernias, masses. Lymphatics: Non tender without lymphadenopathy.  Musculoskeletal: Full ROM, 5/5 strength, {PSY - GAIT AND STATION:22860} gait Skin: Warm, dry without rashes, lesions, ecchymosis.  Neuro: Cranial nerves intact. No cerebellar symptoms.  Psych: Awake and oriented X 3, normal affect, Insight and Judgment appropriate.    Izora Ribas, NP 4:33 PM [facility]

## 2017-11-19 ENCOUNTER — Encounter: Payer: Self-pay | Admitting: Cardiology

## 2017-11-19 ENCOUNTER — Other Ambulatory Visit: Payer: Self-pay

## 2017-11-19 ENCOUNTER — Ambulatory Visit: Payer: Self-pay | Admitting: Adult Health

## 2017-11-19 DIAGNOSIS — E785 Hyperlipidemia, unspecified: Secondary | ICD-10-CM

## 2017-11-19 MED ORDER — EZETIMIBE 10 MG PO TABS: ORAL_TABLET | ORAL | 0 refills | Status: DC

## 2017-11-30 ENCOUNTER — Other Ambulatory Visit: Payer: 59

## 2017-12-21 ENCOUNTER — Other Ambulatory Visit: Payer: Self-pay | Admitting: Internal Medicine

## 2017-12-21 DIAGNOSIS — K219 Gastro-esophageal reflux disease without esophagitis: Secondary | ICD-10-CM

## 2017-12-21 MED ORDER — OMEPRAZOLE 40 MG PO CPDR: DELAYED_RELEASE_CAPSULE | ORAL | 1 refills | Status: DC

## 2018-01-22 ENCOUNTER — Other Ambulatory Visit: Payer: Self-pay | Admitting: Physician Assistant

## 2018-01-24 ENCOUNTER — Other Ambulatory Visit: Payer: Self-pay | Admitting: Internal Medicine

## 2018-02-11 ENCOUNTER — Encounter: Payer: Self-pay | Admitting: Cardiovascular Disease

## 2018-02-11 ENCOUNTER — Ambulatory Visit: Payer: 59 | Admitting: Cardiovascular Disease

## 2018-02-11 VITALS — BP 132/70 | HR 61 | Ht 75.0 in | Wt 247.6 lb

## 2018-02-11 DIAGNOSIS — I5043 Acute on chronic combined systolic (congestive) and diastolic (congestive) heart failure: Secondary | ICD-10-CM

## 2018-02-11 DIAGNOSIS — E782 Mixed hyperlipidemia: Secondary | ICD-10-CM

## 2018-02-11 DIAGNOSIS — I25708 Atherosclerosis of coronary artery bypass graft(s), unspecified, with other forms of angina pectoris: Secondary | ICD-10-CM

## 2018-02-11 LAB — BASIC METABOLIC PANEL
BUN/Creatinine Ratio: 10 (ref 10–24)
BUN: 13 mg/dL (ref 8–27)
CO2: 24 mmol/L (ref 20–29)
Calcium: 9.4 mg/dL (ref 8.6–10.2)
Chloride: 95 mmol/L — ABNORMAL LOW (ref 96–106)
Creatinine, Ser: 1.26 mg/dL (ref 0.76–1.27)
GFR calc Af Amer: 69 mL/min/{1.73_m2} (ref >59)
GFR calc non Af Amer: 60 mL/min/{1.73_m2} (ref >59)
Glucose: 119 mg/dL — ABNORMAL HIGH (ref 65–99)
Potassium: 4.9 mmol/L (ref 3.5–5.2)
Sodium: 137 mmol/L (ref 134–144)

## 2018-02-11 LAB — LIPID PANEL
Chol/HDL Ratio: 2.2 ratio (ref 0.0–5.0)
Cholesterol, Total: 92 mg/dL — ABNORMAL LOW (ref 100–199)
HDL: 42 mg/dL (ref >39)
LDL Calculated: 33 mg/dL (ref 0–99)
Triglycerides: 84 mg/dL (ref 0–149)
VLDL Cholesterol Cal: 17 mg/dL (ref 5–40)

## 2018-02-11 LAB — HEPATIC FUNCTION PANEL
ALT: 14 IU/L (ref 0–44)
AST: 16 IU/L (ref 0–40)
Albumin: 4.2 g/dL (ref 3.6–4.8)
Alkaline Phosphatase: 117 IU/L (ref 39–117)
Bilirubin Total: 0.7 mg/dL (ref 0.0–1.2)
Bilirubin, Direct: 0.23 mg/dL (ref 0.00–0.40)
Total Protein: 6.3 g/dL (ref 6.0–8.5)

## 2018-02-11 NOTE — Patient Instructions (Signed)
Medication Instructions:  Your physician recommends that you continue on your current medications as directed. Please refer to the Current Medication list given to you today.   Labwork: TODAY: BMET, LFTs, LIPIDS  Testing/Procedures: None ordered  Follow-Up: Your physician recommends that you schedule a follow-up appointment in: 3 months with Dr. Acie Fredrickson.   Any Other Special Instructions Will Be Listed Below (If Applicable).     If you need a refill on your cardiac medications before your next appointment, please call your pharmacy.

## 2018-02-11 NOTE — Progress Notes (Signed)
Cardiology Office Note:    Date:  02/11/2018   ID:  Curtis Perez, DOB 1951/02/26, MRN [id number]  PCP:  Unk Pinto, MD  Cardiologist:  No primary care provider on file.   Referring MD: Unk Pinto, MD   Problem list 1.  Coronary artery disease-status post coronary artery bypass grafting in 2013 2.  Cigarette smoking 3.  Hyperlipidemia  4.  Hypertension  Chief Complaint  Patient presents with  . Coronary Artery Disease    Feb. 25, 2019   Curtis Perez is a 67 y.o. male with a hx of coronary artery disease.  I met him 6 years ago   He recently had a non-ST segment elevation myocardial infarction in November. Cardiac catheter revealed a 99% stenosis of the circumflex artery and an occlusion of the saphenous vein graft to the OM and saphenous vein graft to the RCA.  Drug-eluting stent was placed in the proximal circumflex artery the distal circumflex was occluded and was not amenable to angioplasty.  His left ventricular systolic function is mildly reduced with an EF of 40-45%.  He continues to smoke.  He does not seem to have any interest in smoking cessation or dietary changes.     He developed CHF following his  NSTEMI .    Still eats ha  Past Medical History:  Diagnosis Date  . Acute inferior myocardial infarction ([place]) 10/24/2017  . Adenomatous colon polyp 02/15/2009  . Bradycardia   . CAD (coronary artery disease)    remote MI in 2004 with PCI; s/p CABG x 3 03/2012  . Depression   . Family history of malignant neoplasm of gastrointestinal tract   . GERD (gastroesophageal reflux disease)   . Hyperlipidemia   . Hypertension   . Hypothyroidism   . Myocardial infarction ([place])    2004  . Obesity   . Prediabetes   . 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 Tobacco abuse 03/28/2012  . Vitamin D deficiency     Past Surgical History:  Procedure Laterality Date  . BACK SURGERY    . COLONOSCOPY  02/15/2009  .  CORONARY ANGIOPLASTY WITH STENT PLACEMENT     about 8 years ago at [facility]  . CORONARY ARTERY BYPASS GRAFT  04/02/2012   Procedure: CORONARY ARTERY BYPASS GRAFTING (CABG);  Surgeon: Rexene Alberts, MD;  Location: [facility];  Service: Open Heart Surgery;  Laterality: N/A;  On pump, times three graphs using endoscopically harvested right greater saphenous vein and left internal mammary artery.   [place] ACUTE MI REVASCULARIZATION N/A 10/23/2017   Procedure: Coronary/Graft Acute MI Revascularization;  Surgeon: Jettie Booze, MD;  Location: [place] [facility];  Service: Cardiovascular;  Laterality: N/A;  . KNEE SURGERY    . LEFT HEART CATH AND CORS/GRAFTS ANGIOGRAPHY N/A 10/23/2017   Procedure: LEFT HEART CATH AND CORS/GRAFTS ANGIOGRAPHY;  Surgeon: Jettie Booze, MD;  Location: [place] [facility];  Service: Cardiovascular;  Laterality: N/A;  . TONSILLECTOMY      Current Medications: Current Meds  Medication Sig  . aspirin 81 MG chewable tablet Chew 1 tablet (81 mg total) daily by mouth.  Marland Kitchen atorvastatin (LIPITOR) 80 MG tablet TAKE 1 TABLET (80 MG TOTAL) DAILY AT 6 PM BY MOUTH.  . Cyanocobalamin (VITAMIN B-12 PO) Take 1,000 mcg by mouth daily.   Marland Kitchen ezetimibe (ZETIA) 10 MG tablet TAKE 1 TABLET BY MOUTH  EVERY DAY FOR CHOLESTEROL  . FLUoxetine (PROZAC) 40 MG capsule TAKE 1 CAPSULE DAILY FOR MOOD  . furosemide (LASIX) 20 MG tablet Take 1 tablet (20 mg total) by mouth 2 (two) times daily.  . IRON PO Take 65 mg (of elemental iron) every other day  . isosorbide mononitrate (IMDUR) 30 MG 24 hr tablet TAKE 1 TABLET (30 MG TOTAL) DAILY BY MOUTH.  Marland Kitchen levothyroxine (SYNTHROID, LEVOTHROID) 100 MCG tablet TAKE 1 TABLET (100 MCG TOTAL) BY MOUTH DAILY BEFORE BREAKFAST.  Marland Kitchen losartan (COZAAR) 100 MG tablet TAKE 1/2 TO 1 TABLET DAILY FOR BLOOD PRESSURE  . Magnesium 400 MG CAPS Take 400 mg 4 (four) times daily by mouth.   . metFORMIN (GLUCOPHAGE-XR) 500 MG 24 hr tablet TAKE 1 TABLET WITH  BREAKFAST AND LUNCH & 2 TABLETS AT SUPPER FOR DIABETES  . metoprolol tartrate (LOPRESSOR) 25 MG tablet TAKE 1 TABLET (25 MG TOTAL) 2 (TWO) TIMES DAILY BY MOUTH.  . nitroGLYCERIN (NITROSTAT) 0.4 MG SL tablet Place 1 tablet (0.4 mg total) every 5 (five) minutes as needed under the tongue.  . Omega-3 Fatty Acids (FISH OIL PO) Take 2,000 mg by mouth daily.  Marland Kitchen omeprazole (PRILOSEC) 40 MG capsule Take 1 capsule daily for acid Indigestion & Reflux  . potassium chloride SA (K-DUR,KLOR-CON) 20 MEQ tablet Take 20 mEq by mouth 2 (two) times daily. SUNDAYS AND Thursday 40MEQ  . ticagrelor (BRILINTA) 90 MG TABS tablet Take 1 tablet (90 mg total) 2 (two) times daily by mouth.  . Vitamin D, Ergocalciferol, (DRISDOL) 50000 UNITS CAPS capsule Take 1 capsule (50,000 Units total) by mouth 3 (three) times a week. (Patient taking differently: Take 50,000 Units by mouth every 7 (seven) days. )     Allergies:   Lisinopril   Social History   Socioeconomic History  . Marital status: Divorced    Spouse name: None  . Number of children: 0  . Years of education: None  . Highest education level: None  Social Needs  . Financial resource strain: Not very hard  . Food insecurity - worry: Never true  . Food insecurity - inability: Never true  . Transportation needs - medical: No  . Transportation needs - non-medical: No  Occupational History  . Occupation: Disability  . Occupation: Counsellor for [employer]  Tobacco Use  . Smoking status: Current Every Day Smoker    Packs/day: 2.00    Years: 40.00    Pack years: 80.00    Types: Cigarettes  . Smokeless tobacco: Never Used  . Tobacco comment: down from 2ppd  Substance and Sexual Activity  . Alcohol use: No    Alcohol/week: 1.2 oz    Types: 1 Glasses of wine, 1 Shots of liquor per week    Comment: Rarely  . Drug use: No  . Sexual activity: Not Currently  Other Topics Concern  . None  Social History Narrative  . None     Family History: The  patient's family history includes Alzheimer's disease in his mother; Colon cancer in his father and paternal grandmother; Hypertension in his mother. There is no history of Esophageal cancer, Rectal cancer, or Stomach cancer.  ROS:   Please see the history of present illness.     All other systems reviewed and are negative.  EKGs/Labs/Other Studies Reviewed:    The following studies were reviewed today:   EKG:    Recent Labs: 08/13/2017: TSH 1.56 10/24/2017: ALT 15 10/25/2017: Magnesium 1.7 11/16/2017: BUN 10; Creatinine, Ser 1.36; Hemoglobin 13.3;  Platelets 255; Potassium 3.8; Sodium 135  Recent Lipid Panel    Component Value Date/Time   CHOL 193 10/24/2017 0247   TRIG 171 (H) 10/24/2017 0247   HDL 43 10/24/2017 0247   CHOLHDL 4.5 10/24/2017 0247   VLDL 34 10/24/2017 0247   LDLCALC 116 (H) 10/24/2017 0247    Physical Exam:    VS:  BP 132/70   Pulse 61   Ht '6\' 3"'  (1.905 m)   Wt 247 lb 9.6 oz (112.3 kg)   SpO2 96%   BMI 30.95 kg/m     Wt Readings from Last 3 Encounters:  02/11/18 247 lb 9.6 oz (112.3 kg)  11/16/17 271 lb (122.9 kg)  11/01/17 262 lb 8 oz (119.1 kg)     GEN:  Well nourished, well developed in no acute distress HEENT: Normal NECK: No JVD; No carotid bruits LYMPHATICS: No lymphadenopathy CARDIAC: RRR, no murmurs, rubs, gallops RESPIRATORY:  Clear to auscultation without rales, wheezing or rhonchi  ABDOMEN: Soft, non-tender, non-distended MUSCULOSKELETAL:  No edema; No deformity  SKIN: Warm and dry NEUROLOGIC:  Alert and oriented x 3 PSYCHIATRIC:  Normal affect   ASSESSMENT:    No diagnosis found. PLAN:    In order of problems listed above:  1.  CAD: He status post stenting of the circumflex vessel.  He has had a previous angioplasty.  Continues to smoke.  We discussed the benefits of smoking cessation.  2.  Acute on chronic sys. / dias CHF :   Better .   He takes Lasix 20 mg a day.  He takes potassium chloride 20 mEq a day but then takes  an extra tablet on Thursdays and Sundays because of leg cramping.  3.  hyperlipidemiia   Check labs today .   Medication Adjustments/Labs and Tests Ordered: Current medicines are reviewed at length with the patient today.  Concerns regarding medicines are outlined above.  No orders of the defined types were placed in this encounter.  No orders of the defined types were placed in this encounter.   Signed, Mertie Moores, MD  02/11/2018 9:38 AM    [facility]

## 2018-02-22 ENCOUNTER — Other Ambulatory Visit: Payer: Self-pay

## 2018-02-22 DIAGNOSIS — E785 Hyperlipidemia, unspecified: Secondary | ICD-10-CM

## 2018-02-22 MED ORDER — EZETIMIBE 10 MG PO TABS: ORAL_TABLET | ORAL | 0 refills | Status: DC

## 2018-03-03 NOTE — Progress Notes (Signed)
NO SHOW

## 2018-03-04 ENCOUNTER — Ambulatory Visit (INDEPENDENT_AMBULATORY_CARE_PROVIDER_SITE_OTHER): Payer: 59 | Admitting: Internal Medicine

## 2018-03-04 DIAGNOSIS — Z5329 Procedure and treatment not carried out because of patient's decision for other reasons: Secondary | ICD-10-CM

## 2018-03-04 DIAGNOSIS — Z9119 Patient's noncompliance with other medical treatment and regimen: Secondary | ICD-10-CM | POA: Diagnosis not present

## 2018-03-17 ENCOUNTER — Other Ambulatory Visit: Payer: Self-pay | Admitting: Adult Health

## 2018-03-25 ENCOUNTER — Ambulatory Visit: Payer: 59 | Admitting: Internal Medicine

## 2018-03-25 ENCOUNTER — Encounter: Payer: Self-pay | Admitting: Internal Medicine

## 2018-03-25 VITALS — BP 120/76 | HR 56 | Temp 97.5°F | Resp 18 | Ht 75.0 in | Wt 247.2 lb

## 2018-03-25 DIAGNOSIS — E6609 Other obesity due to excess calories: Secondary | ICD-10-CM | POA: Diagnosis not present

## 2018-03-25 DIAGNOSIS — E1122 Type 2 diabetes mellitus with diabetic chronic kidney disease: Secondary | ICD-10-CM | POA: Diagnosis not present

## 2018-03-25 DIAGNOSIS — N182 Chronic kidney disease, stage 2 (mild): Secondary | ICD-10-CM

## 2018-03-25 DIAGNOSIS — I25708 Atherosclerosis of coronary artery bypass graft(s), unspecified, with other forms of angina pectoris: Secondary | ICD-10-CM | POA: Diagnosis not present

## 2018-03-25 DIAGNOSIS — E039 Hypothyroidism, unspecified: Secondary | ICD-10-CM | POA: Diagnosis not present

## 2018-03-25 DIAGNOSIS — K219 Gastro-esophageal reflux disease without esophagitis: Secondary | ICD-10-CM

## 2018-03-25 DIAGNOSIS — E559 Vitamin D deficiency, unspecified: Secondary | ICD-10-CM | POA: Diagnosis not present

## 2018-03-25 DIAGNOSIS — E782 Mixed hyperlipidemia: Secondary | ICD-10-CM

## 2018-03-25 DIAGNOSIS — Z79899 Other long term (current) drug therapy: Secondary | ICD-10-CM | POA: Diagnosis not present

## 2018-03-25 DIAGNOSIS — Z6830 Body mass index (BMI) 30.0-30.9, adult: Secondary | ICD-10-CM

## 2018-03-25 DIAGNOSIS — I10 Essential (primary) hypertension: Secondary | ICD-10-CM | POA: Diagnosis not present

## 2018-03-25 MED ORDER — PHENTERMINE HCL 37.5 MG PO TABS: ORAL_TABLET | ORAL | 5 refills | Status: DC

## 2018-03-25 NOTE — Progress Notes (Signed)
This very nice 67 y.o. single WM presents for 6 month follow up with HTN, HLD, T2_NIDDM and Vitamin D Deficiency.      Patient is treated for HTN (2005) & BP has been controlled at home. Today's BP is at goal - 120/76.  In 2005, patient presented w/ACS undergoing PCA/Stent and again with ACS in 2013- then undergoing CABG.  In Nov 2018 , he had MI and cath found a 99%Circ with a prox DES placed by Dr Acie Fredrickson & other lesions not amenable to PCA.  In later Nov 2018, he was treated for mild CHF, diuresed and released and has done well since.  Since then patient has had no complaints of any cardiac type chest pain, palpitations, dyspnea / orthopnea / PND, dizziness, claudication, or dependent edema.     Hyperlipidemia is controlled with diet & meds. Patient denies myalgias or other med SE's. Last Lipids were at goal:  Lab Results  Component Value Date   CHOL 92 (L) 02/11/2018   HDL 42 02/11/2018   LDLCALC 33 02/11/2018   TRIG 84 02/11/2018   CHOLHDL 2.2 02/11/2018      Also, the patient has history of PreDM (2011) and then T2_NIDDM (2016) w/ hx/o CKD2 and has had no symptoms of reactive hypoglycemia, diabetic polys, paresthesias or visual blurring.  Last A1c was not at goal: Lab Results  Component Value Date   HGBA1C 6.0 (H) 08/13/2017      Further, the patient also has history of Vitamin D Deficiency ("6"/2008) and supplements vitamin D without any suspected side-effects. Last vitamin D was at goal:  Lab Results  Component Value Date   VD25OH 89 08/13/2017   Current Outpatient Medications on File Prior to Visit  Medication Sig  . aspirin 81 MG chewable tablet Chew 1 tablet (81 mg total) daily by mouth.  Marland Kitchen atorvastatin (LIPITOR) 80 MG tablet TAKE 1 TABLET (80 MG TOTAL) DAILY AT 6 PM BY MOUTH.  . Cyanocobalamin (VITAMIN B-12 PO) Take 1,000 mcg by mouth daily.   Marland Kitchen ezetimibe (ZETIA) 10 MG tablet TAKE 1 TABLET BY MOUTH EVERY DAY FOR CHOLESTEROL  . FLUoxetine (PROZAC) 40 MG capsule TAKE 1  CAPSULE DAILY FOR MOOD  . furosemide (LASIX) 20 MG tablet Take 1 tablet (20 mg total) by mouth 2 (two) times daily.  . IRON PO Take 65 mg (of elemental iron) every other day  . isosorbide mononitrate (IMDUR) 30 MG 24 hr tablet TAKE 1 TABLET (30 MG TOTAL) DAILY BY MOUTH.  Marland Kitchen levothyroxine (SYNTHROID, LEVOTHROID) 100 MCG tablet TAKE 1 TABLET (100 MCG TOTAL) BY MOUTH DAILY BEFORE BREAKFAST.  Marland Kitchen losartan (COZAAR) 100 MG tablet TAKE 1/2 TO 1 TABLET DAILY FOR BLOOD PRESSURE  . Magnesium 400 MG CAPS Take 400 mg by mouth 5 (five) times daily.   . metFORMIN (GLUCOPHAGE-XR) 500 MG 24 hr tablet TAKE 1 TABLET WITH BREAKFAST AND LUNCH & 2 TABLETS AT SUPPER FOR DIABETES  . metoprolol tartrate (LOPRESSOR) 25 MG tablet TAKE 1 TABLET (25 MG TOTAL) 2 (TWO) TIMES DAILY BY MOUTH.  . nitroGLYCERIN (NITROSTAT) 0.4 MG SL tablet Place 1 tablet (0.4 mg total) every 5 (five) minutes as needed under the tongue.  . Omega-3 Fatty Acids (FISH OIL PO) Take 2,000 mg by mouth daily.  Marland Kitchen omeprazole (PRILOSEC) 40 MG capsule Take 1 capsule daily for acid Indigestion & Reflux  . potassium chloride SA (K-DUR,KLOR-CON) 20 MEQ tablet Take 20 mEq by mouth 2 (two) times daily. SUNDAYS AND Thursday  40MEQ  . ticagrelor (BRILINTA) 90 MG TABS tablet Take 1 tablet (90 mg total) 2 (two) times daily by mouth.  . Vitamin D, Ergocalciferol, (DRISDOL) 50000 UNITS CAPS capsule Take 1 capsule (50,000 Units total) by mouth 3 (three) times a week. (Patient taking differently: Take 50,000 Units by mouth every 7 (seven) days. )   No current facility-administered medications on file prior to visit.    Allergies  Allergen Reactions  . Lisinopril Cough   PMHx:   Past Medical History:  Diagnosis Date  . Acute inferior myocardial infarction ([place]) 10/24/2017  . Adenomatous colon polyp 02/15/2009  . Bradycardia   . CAD (coronary artery disease)    remote MI in 2004 with PCI; s/p CABG x 3 03/2012  . Depression   . Family history of malignant neoplasm of  gastrointestinal tract   . GERD (gastroesophageal reflux disease)   . Hyperlipidemia   . Hypertension   . Hypothyroidism   . Myocardial infarction ([place])    2004  . Obesity   . Prediabetes   . 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 Tobacco abuse 03/28/2012  . Vitamin D deficiency    Immunization History  Administered Date(s) Administered  . PPD Test 06/15/2014, 07/10/2016  . Pneumococcal Polysaccharide-23 01/05/2009, 08/10/2014  . Td 01/05/2009  . Zoster 01/03/2016   Past Surgical History:  Procedure Laterality Date  . BACK SURGERY    . COLONOSCOPY  02/15/2009  . CORONARY ANGIOPLASTY WITH STENT PLACEMENT     about 8 years ago at [facility]  . CORONARY ARTERY BYPASS GRAFT  04/02/2012   Procedure: CORONARY ARTERY BYPASS GRAFTING (CABG);  Surgeon: Rexene Alberts, MD;  Location: [facility];  Service: Open Heart Surgery;  Laterality: N/A;  On pump, times three graphs using endoscopically harvested right greater saphenous vein and left internal mammary artery.   [facility] ACUTE MI REVASCULARIZATION N/A 10/23/2017   Procedure: Coronary/Graft Acute MI Revascularization;  Surgeon: Jettie Booze, MD;  Location: [facility];  Service: Cardiovascular;  Laterality: N/A;  . KNEE SURGERY    . LEFT HEART CATH AND CORS/GRAFTS ANGIOGRAPHY N/A 10/23/2017   Procedure: LEFT HEART CATH AND CORS/GRAFTS ANGIOGRAPHY;  Surgeon: Jettie Booze, MD;  Location: [facility];  Service: Cardiovascular;  Laterality: N/A;  . TONSILLECTOMY     - Negative Colon - July 2018  FHx:    Reviewed / unchanged  SHx:    Reviewed / unchanged   Systems Review:  Constitutional: Denies fever, chills, wt changes, headaches, insomnia, fatigue, night sweats, change in appetite. Eyes: Denies redness, blurred vision, diplopia, discharge, itchy, watery eyes.  ENT: Denies discharge, congestion, post nasal drip, epistaxis, sore throat, earache,  hearing loss, dental pain, tinnitus, vertigo, sinus pain, snoring.  CV: Denies chest pain, palpitations, irregular heartbeat, syncope, dyspnea, diaphoresis, orthopnea, PND, claudication or edema. Respiratory: denies cough, dyspnea, DOE, pleurisy, hoarseness, laryngitis, wheezing.  Gastrointestinal: Denies dysphagia, odynophagia, heartburn, reflux, water brash, abdominal pain or cramps, nausea, vomiting, bloating, diarrhea, constipation, hematemesis, melena, hematochezia  or hemorrhoids. Genitourinary: Denies dysuria, frequency, urgency, nocturia, hesitancy, discharge, hematuria or flank pain. Musculoskeletal: Denies arthralgias, myalgias, stiffness, jt. swelling, pain, limping or strain/sprain.  Skin: Denies pruritus, rash, hives, warts, acne, eczema or change in skin lesion(s). Neuro: No weakness, tremor, incoordination, spasms, paresthesia or pain. Psychiatric: Denies confusion, memory loss or sensory loss. Endo: Denies change in weight, skin or  hair change.  Heme/Lymph: No excessive bleeding, bruising or enlarged lymph nodes.  Physical Exam  BP 120/76   Pulse (!) 56   Temp (!) 97.5 F (36.4 C)   Resp 18   Ht 6\' 3"  (1.905 m)   Wt 247 lb 3.2 oz (112.1 kg)   BMI 30.90 kg/m   Appears  well nourished, well groomed  and in no distress.  Eyes: PERRLA, EOMs, conjunctiva no swelling or erythema. Sinuses: No frontal/maxillary tenderness ENT/Mouth: EAC's clear, TM's nl w/o erythema, bulging. Nares clear w/o erythema, swelling, exudates. Oropharynx clear without erythema or exudates. Oral hygiene is good. Tongue normal, non obstructing. Hearing intact.  Neck: Supple. Thyroid not palpable. Car 2+/2+ without bruits, nodes or JVD. Chest: Respirations nl with BS clear & equal w/o rales, rhonchi, wheezing or stridor.  Cor: Heart sounds normal w/ regular rate and rhythm without sig. murmurs, gallops, clicks or rubs. Peripheral pulses normal and equal  without edema.  Abdomen: Soft & bowel sounds  normal. Non-tender w/o guarding, rebound, hernias, masses or organomegaly.  Lymphatics: Unremarkable.  Musculoskeletal: Full ROM all peripheral extremities, joint stability, 5/5 strength and normal gait.  Skin: Warm, dry without exposed rashes, lesions or ecchymosis apparent.  Neuro: Cranial nerves intact, reflexes equal bilaterally. Sensory-motor testing grossly intact. Tendon reflexes grossly intact.  Pysch: Alert & oriented x 3.  Insight and judgement nl & appropriate. No ideations.  Assessment and Plan:  1. Essential hypertension  - Continue medication, monitor blood pressure at home.  - Continue DASH diet.  Reminder to go to the ER if any CP,  SOB, nausea, dizziness, severe HA, changes vision/speech.  - CBC with Differential/Platelet - BASIC METABOLIC PANEL WITH GFR - Magnesium - TSH  2. Hyperlipidemia, mixed  - Continue diet/meds, exercise,& lifestyle modifications.  - Continue monitor periodic cholesterol/liver & renal functions   - Hepatic function panel - Lipid panel - TSH  3. Type 2 diabetes mellitus with stage 2 chronic kidney disease, without long-term current use of insulin (HCC)  - Continue diet, exercise, lifestyle modifications.  - Monitor appropriate labs. - Hemoglobin A1c - Insulin, random  4. Vitamin D deficiency  - Continue supplementation.  - VITAMIN D 25 Hydroxyl  5. 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 Hypothyroidism  - TSH  7. Gastroesophageal reflux disease  - CBC with Differential/Platelet  8. Medication management  - CBC with Differential/Platelet - BASIC METABOLIC PANEL WITH GFR - Hepatic function panel - Magnesium - Lipid panel - TSH - Hemoglobin A1c - Insulin, random - VITAMIN D 25 Hydroxyl  9. Class 1 obesity due to excess calories with serious comorbidity and body mass index (BMI) of 30.0 to 30.9 in adult  - phentermine (ADIPEX-P)  37.5 MG tablet; Take 1/2 to 1 tablet every morning for dieting / weight loss and Mood  Dispense: 30 tablet; Refill: 5           Discussed  regular exercise, BP monitoring, weight control to achieve/maintain BMI less than 25 and discussed med and SE's. Recommended labs to assess and monitor clinical status with further disposition pending results of labs. Over 30 minutes of exam, counseling, chart review was performed.

## 2018-03-25 NOTE — Patient Instructions (Signed)

## 2018-03-26 LAB — TSH: TSH: 0.99 mIU/L (ref 0.40–4.50)

## 2018-03-26 LAB — BASIC METABOLIC PANEL WITH GFR
BUN/Creatinine Ratio: 15 (calc) (ref 6–22)
BUN: 19 mg/dL (ref 7–25)
CO2: 29 mmol/L (ref 20–32)
Calcium: 9.4 mg/dL (ref 8.6–10.3)
Chloride: 99 mmol/L (ref 98–110)
Creat: 1.26 mg/dL — ABNORMAL HIGH (ref 0.70–1.25)
GFR, Est African American: 69 mL/min/{1.73_m2} (ref >=60)
GFR, Est Non African American: 60 mL/min/{1.73_m2} (ref >=60)
Glucose, Bld: 107 mg/dL — ABNORMAL HIGH (ref 65–99)
Potassium: 4.6 mmol/L (ref 3.5–5.3)
Sodium: 136 mmol/L (ref 135–146)

## 2018-03-26 LAB — CBC WITH DIFFERENTIAL/PLATELET
Basophils Absolute: 50 cells/uL (ref 0–200)
Basophils Relative: 0.8 %
Eosinophils Absolute: 161 cells/uL (ref 15–500)
Eosinophils Relative: 2.6 %
HCT: 38.5 % (ref 38.5–50.0)
Hemoglobin: 13.1 g/dL — ABNORMAL LOW (ref 13.2–17.1)
Lymphs Abs: 1401 cells/uL (ref 850–3900)
MCH: 28.9 pg (ref 27.0–33.0)
MCHC: 34 g/dL (ref 32.0–36.0)
MCV: 85 fL (ref 80.0–100.0)
MPV: 11.8 fL (ref 7.5–12.5)
Monocytes Relative: 6.5 %
Neutro Abs: 4185 cells/uL (ref 1500–7800)
Neutrophils Relative %: 67.5 %
Platelets: 223 10*3/uL (ref 140–400)
RBC: 4.53 10*6/uL (ref 4.20–5.80)
RDW: 14.3 % (ref 11.0–15.0)
Total Lymphocyte: 22.6 %
WBC mixed population: 403 cells/uL (ref 200–950)
WBC: 6.2 10*3/uL (ref 3.8–10.8)

## 2018-03-26 LAB — HEPATIC FUNCTION PANEL
AG Ratio: 1.8 (calc) (ref 1.0–2.5)
ALT: 12 U/L (ref 9–46)
AST: 12 U/L (ref 10–35)
Albumin: 4.2 g/dL (ref 3.6–5.1)
Alkaline phosphatase (APISO): 108 U/L (ref 40–115)
Bilirubin, Direct: 0.2 mg/dL (ref 0.0–0.2)
Globulin: 2.3 g/dL (calc) (ref 1.9–3.7)
Indirect Bilirubin: 0.5 mg/dL (calc) (ref 0.2–1.2)
Total Bilirubin: 0.7 mg/dL (ref 0.2–1.2)
Total Protein: 6.5 g/dL (ref 6.1–8.1)

## 2018-03-26 LAB — LIPID PANEL
Cholesterol: 113 mg/dL (ref <200)
HDL: 54 mg/dL (ref >40)
LDL Cholesterol (Calc): 45 mg/dL (calc)
Non-HDL Cholesterol (Calc): 59 mg/dL (calc) (ref <130)
Total CHOL/HDL Ratio: 2.1 (calc) (ref <5.0)
Triglycerides: 56 mg/dL (ref <150)

## 2018-03-26 LAB — HEMOGLOBIN A1C
Hgb A1c MFr Bld: 6.3 % of total Hgb — ABNORMAL HIGH (ref <5.7)
Mean Plasma Glucose: 134 (calc)
eAG (mmol/L): 7.4 (calc)

## 2018-03-26 LAB — VITAMIN D 25 HYDROXY (VIT D DEFICIENCY, FRACTURES): Vit D, 25-Hydroxy: 79 ng/mL (ref 30–100)

## 2018-03-26 LAB — INSULIN, RANDOM: Insulin: 6.7 u[IU]/mL (ref 2.0–19.6)

## 2018-03-26 LAB — MAGNESIUM: Magnesium: 2 mg/dL (ref 1.5–2.5)

## 2018-03-31 ENCOUNTER — Other Ambulatory Visit: Payer: Self-pay | Admitting: Physician Assistant

## 2018-04-05 ENCOUNTER — Other Ambulatory Visit: Payer: Self-pay | Admitting: Urgent Care

## 2018-04-05 ENCOUNTER — Other Ambulatory Visit: Payer: Self-pay | Admitting: Internal Medicine

## 2018-04-14 ENCOUNTER — Other Ambulatory Visit: Payer: Self-pay | Admitting: Adult Health

## 2018-04-16 ENCOUNTER — Encounter: Payer: Self-pay | Admitting: Cardiovascular Disease

## 2018-04-17 ENCOUNTER — Other Ambulatory Visit: Payer: Self-pay | Admitting: Internal Medicine

## 2018-04-17 ENCOUNTER — Other Ambulatory Visit: Payer: Self-pay | Admitting: Adult Health

## 2018-04-18 ENCOUNTER — Encounter: Payer: Self-pay | Admitting: Internal Medicine

## 2018-04-18 ENCOUNTER — Ambulatory Visit: Payer: 59 | Admitting: Internal Medicine

## 2018-04-18 VITALS — BP 122/76 | HR 65 | Temp 97.0°F | Ht 75.0 in | Wt 249.6 lb

## 2018-04-18 DIAGNOSIS — J4540 Moderate persistent asthma, uncomplicated: Secondary | ICD-10-CM | POA: Diagnosis not present

## 2018-04-18 DIAGNOSIS — J0410 Acute tracheitis without obstruction: Secondary | ICD-10-CM | POA: Diagnosis not present

## 2018-04-18 MED ORDER — PREDNISONE 20 MG PO TABS: ORAL_TABLET | ORAL | 1 refills | Status: DC

## 2018-04-18 MED ORDER — BENZONATATE 200 MG PO CAPS: ORAL_CAPSULE | ORAL | 1 refills | Status: DC

## 2018-04-18 NOTE — Patient Instructions (Signed)
   STOP LOSARTAN &   Monitor BP's 2 x/ day and   call if over 145/90

## 2018-04-21 ENCOUNTER — Encounter: Payer: Self-pay | Admitting: Internal Medicine

## 2018-04-21 NOTE — Progress Notes (Signed)
Subjective:    Patient ID: Curtis Perez, male    DOB: 1950/12/21, 67 y.o.   MRN: [id number]  HPI  This nice 67 yo single WM w/ HTN, ASHD and T2_DM presents with head & chest congestion and productive cough. No fever, chills, dyspnea.   Outpatient Medications Prior to Visit  Medication Sig Dispense Refill  . aspirin 81 MG chewable tablet Chew 1 tablet (81 mg total) daily by mouth.    Marland Kitchen atorvastatin (LIPITOR) 80 MG tablet TAKE 1 TABLET (80 MG TOTAL) DAILY AT 6 PM BY MOUTH. 90 tablet 0  . Cyanocobalamin (VITAMIN B-12 PO) Take 1,000 mcg by mouth daily.     Marland Kitchen ezetimibe (ZETIA) 10 MG tablet TAKE 1 TABLET BY MOUTH EVERY DAY FOR CHOLESTEROL 90 tablet 0  . FLUoxetine (PROZAC) 40 MG capsule TAKE 1 CAPSULE DAILY FOR MOOD 90 capsule 1  . furosemide (LASIX) 20 MG tablet Take 1 tablet (20 mg total) by mouth daily as needed for fluid. 30 tablet 9  . IRON PO Take 65 mg (of elemental iron) every other day    . isosorbide mononitrate (IMDUR) 30 MG 24 hr tablet TAKE 1 TABLET (30 MG TOTAL) DAILY BY MOUTH. 90 tablet 3  . levothyroxine (SYNTHROID, LEVOTHROID) 100 MCG tablet TAKE 1 TABLET (100 MCG TOTAL) BY MOUTH DAILY BEFORE BREAKFAST. 90 tablet 1  . losartan (COZAAR) 100 MG tablet TAKE 1/2 TO 1 TABLET DAILY FOR BLOOD PRESSURE 90 tablet 2  . Magnesium 400 MG CAPS Take 400 mg by mouth 5 (five) times daily.     . metFORMIN (GLUCOPHAGE-XR) 500 MG 24 hr tablet TAKE 1 TABLET WITH BREAKFAST AND LUNCH & 2 TABLETS AT SUPPER FOR DIABETES 360 tablet 1  . metoprolol tartrate (LOPRESSOR) 25 MG tablet TAKE 1 TABLET (25 MG TOTAL) 2 (TWO) TIMES DAILY BY MOUTH. 60 tablet 2  . nitroGLYCERIN (NITROSTAT) 0.4 MG SL tablet Place 1 tablet (0.4 mg total) every 5 (five) minutes as needed under the tongue. 25 tablet 2  . Omega-3 Fatty Acids (FISH OIL PO) Take 2,000 mg by mouth daily.    Marland Kitchen omeprazole (PRILOSEC) 40 MG capsule Take 1 capsule daily for acid Indigestion & Reflux 90 capsule 1  . phentermine (ADIPEX-P) 37.5 MG tablet Take 1/2  to 1 tablet every morning for dieting / weight loss and Mood 30 tablet 5  . potassium chloride SA (K-DUR,KLOR-CON) 20 MEQ tablet Take 20 mEq by mouth 2 (two) times daily. SUNDAYS AND Thursday 40MEQ    . ticagrelor (BRILINTA) 90 MG TABS tablet Take 1 tablet (90 mg total) 2 (two) times daily by mouth. 60 tablet 10  . Vitamin D, Ergocalciferol, (DRISDOL) 50000 UNITS CAPS capsule Take 1 capsule (50,000 Units total) by mouth 3 (three) times a week. (Patient taking differently: Take 50,000 Units by mouth every 7 (seven) days. ) 30 capsule 3  . furosemide (LASIX) 20 MG tablet Take 1 tablet (20 mg total) by mouth 2 (two) times daily. (Patient not taking: Reported on 04/18/2018) 60 tablet 0   No facility-administered medications prior to visit.    Allergies  Allergen Reactions  . Lisinopril Cough    Review of Systems  10 point systems review negative except as above.    Objective:   Physical Exam  BP 122/76   Pulse 65   Temp (!) 97 F (36.1 C)   Ht 6\' 3"  (1.905 m)   Wt 249 lb 9.6 oz (113.2 kg)   SpO2 97%   BMI 31.20 kg/m  Congested wheezy cough. No stridor.  HEENT - EACs / TMs - Nl. (+) fronto-maxillary tenderness. N/O/P - clear  Neck - supple.  Chest - Scattered coarse rales & rhonchi & forced post tussive end expiratory wheezes. Cor - Nl HS. RRR w/o sig m. MS- FROM w/o deformities.  Gait Nl. Neuro -  Nl w/o focal abnormalities.     Assessment & Plan:   1. Tracheitis  - predniSONE (DELTASONE) 20 MG tablet; 1 tab 3 x day for 3 days, then 1 tab 2 x day for 3 days, then 1 tab 1 x day for 5 days  Dispense: 20 tablet; Refill: 1  - benzonatate (TESSALON) 200 MG capsule; Take 1 perle 3 x / day to prevent cough  Dispense: 30 capsule; Refill: 1  2. Moderate persistent asthma without complication  - predniSONE (DELTASONE) 20 MG tablet; 1 tab 3 x day for 3 days, then 1 tab 2 x day for 3 days, then 1 tab 1 x day for 5 days  Dispense: 20 tablet; Refill: 1

## 2018-05-06 ENCOUNTER — Ambulatory Visit: Payer: 59 | Admitting: Cardiovascular Disease

## 2018-05-06 DIAGNOSIS — R0989 Other specified symptoms and signs involving the circulatory and respiratory systems: Secondary | ICD-10-CM

## 2018-05-14 ENCOUNTER — Other Ambulatory Visit: Payer: Self-pay | Admitting: Internal Medicine

## 2018-05-14 ENCOUNTER — Encounter (INDEPENDENT_AMBULATORY_CARE_PROVIDER_SITE_OTHER): Payer: Self-pay

## 2018-05-14 DIAGNOSIS — J4540 Moderate persistent asthma, uncomplicated: Secondary | ICD-10-CM

## 2018-05-14 DIAGNOSIS — J0410 Acute tracheitis without obstruction: Secondary | ICD-10-CM

## 2018-05-14 MED ORDER — PREDNISONE 20 MG PO TABS: ORAL_TABLET | ORAL | 1 refills | Status: DC

## 2018-05-14 MED ORDER — LEVOFLOXACIN 500 MG PO TABS: ORAL_TABLET | ORAL | 0 refills | Status: DC

## 2018-05-14 MED ORDER — PROMETHAZINE-PHENYLEPHRINE 6.25-5 MG/5ML PO SYRP: ORAL_SOLUTION | ORAL | 1 refills | Status: DC

## 2018-05-16 ENCOUNTER — Other Ambulatory Visit: Payer: Self-pay | Admitting: Physician Assistant

## 2018-05-16 DIAGNOSIS — E785 Hyperlipidemia, unspecified: Secondary | ICD-10-CM

## 2018-05-17 ENCOUNTER — Other Ambulatory Visit: Payer: Self-pay | Admitting: Internal Medicine

## 2018-06-09 ENCOUNTER — Other Ambulatory Visit: Payer: Self-pay | Admitting: Internal Medicine

## 2018-07-05 NOTE — Progress Notes (Signed)
FOLLOW UP  Assessment and Plan:    CHF Weights stable Emphasized salt restriction, less than 2000mg  a day. Encouraged daily monitoring of the patient's weight, call office if 5 lb weight loss or gain in a day.  Encouraged regular exercise. If any increasing shortness of breath, swelling, or chest pressure go to ER immediately.  decrease your fluid intake to less than 2 L daily please remember to always increase your potassium intake with any increase of your fluid pill.   Hypertension Well controlled with current medications, ? Hypotensive symptoms, cut losartan in 1/2 if consistently running low at home with continued dizziness Monitor blood pressure at home; patient to call if consistently greater than 130/80 Continue DASH diet.   Reminder to go to the ER if any CP, SOB, nausea, dizziness, severe HA, changes vision/speech, left arm numbness and tingling and jaw pain.  Cholesterol Currently at goal;  Continue low cholesterol diet and exercise.  Check lipid panel.   Diabetes with diabetic chronic kidney disease Continue medication: metformin Continue diet and exercise.  Perform daily foot/skin check, notify office of any concerning changes.  Check A1C  Hypothyroidism continue medications the same pending lab results reminded to take on an empty stomach 30-37mins before food.  check TSH level  Obesity with co morbidities Long discussion about weight loss, diet, and exercise Recommended diet heavy in fruits and veggies and low in animal meats, cheeses, and dairy products, appropriate calorie intake Discussed ideal weight for height Will follow up in 3 months  Vitamin D Def At goal at last visit; continue supplementation to maintain goal of 70-100 Defer Vit D level  Tobacco use Discussed risks associated with tobacco use and advised to reduce or quit Patient is not ready to do so, but advised to consider strongly Will follow up at the next visit  Continue diet and  meds as discussed. Further disposition pending results of labs. Discussed med's effects and SE's.   Over 30 minutes of exam, counseling, chart review, and critical decision making was performed.   Future Appointments  Date Time Provider [place]  10/14/2018  2:00 PM Unk Pinto, MD [id number] None    ----------------------------------------------------------------------------------------------------------------------  HPI 67 y.o. male  presents for 3 month follow up on CAD, CHF, hypertension, cholesterol, T2 diabetes, hypothyroid, obesity and vitamin D deficiency. He has hx of ACS in 2005 and underwent PCA/stent; repeat ACS in 2013 and had CABG. Recently in 10/2017 he had MI with 99% circ with proximal DES placed by Dr. Acie Fredrickson; he has done well since.   he unfortunately currently continues to smoke 2 packs a day; discussed risks associated with smoking, patient is not willing to quit.   BMI is Body mass index is 29.5 kg/m., he has been working on diet , he does not intentionally exercise. He is watching portions.   He has a history of Combined Systolic and Diastolic CHF (90/2409 ECHO EF 73-53%, grade 2 diastolic dysfunction), denies orthopnea, paroxysmal nocturnal dyspnea and edema. Positive for exertional dyspnea. Wt Readings from Last 3 Encounters:  07/08/18 236 lb (107 kg)  04/18/18 249 lb 9.6 oz (113.2 kg)  03/25/18 247 lb 3.2 oz (112.1 kg)   His blood pressure has been controlled at home, today their BP is BP: 104/60   He does not workout. He denies chest pain, he does endorse exertional dyspnea, and some dizziness   He is on cholesterol medication (lipitor, zetia) and denies myalgias. His cholesterol is at goal. The cholesterol last visit was:  Lab Results  Component Value Date   CHOL 113 03/25/2018   HDL 54 03/25/2018   LDLCALC 45 03/25/2018   TRIG 56 03/25/2018   CHOLHDL 2.1 03/25/2018    He has been working on diet for T2DM on metformin, and denies foot  ulcerations, increased appetite, nausea, paresthesia of the feet, polydipsia, polyuria, visual disturbances and vomiting. He does not check sugars at home. Last A1C in the office was:  Lab Results  Component Value Date   HGBA1C 6.3 (H) 03/25/2018   He is on thyroid medication. His medication was not changed last visit.   Lab Results  Component Value Date   TSH 0.99 03/25/2018   Patient is on Vitamin D supplement.   Lab Results  Component Value Date   VD25OH 79 03/25/2018        Current Medications:  Current Outpatient Medications on File Prior to Visit  Medication Sig  . aspirin 81 MG chewable tablet Chew 1 tablet (81 mg total) daily by mouth.  Marland Kitchen atorvastatin (LIPITOR) 80 MG tablet TAKE 1 TABLET (80 MG TOTAL) DAILY AT 6 PM BY MOUTH.  . Cyanocobalamin (VITAMIN B-12 PO) Take 1,000 mcg by mouth daily.   Marland Kitchen ezetimibe (ZETIA) 10 MG tablet TAKE 1 TABLET BY MOUTH EVERY DAY  . FLUoxetine (PROZAC) 40 MG capsule TAKE 1 CAPSULE DAILY FOR MOOD  . IRON PO Take 65 mg (of elemental iron) every other day  . isosorbide mononitrate (IMDUR) 30 MG 24 hr tablet TAKE 1 TABLET (30 MG TOTAL) DAILY BY MOUTH.  Marland Kitchen levofloxacin (LEVAQUIN) 500 MG tablet Take 1 tablet / daily with food x 10 days for infection  . levothyroxine (SYNTHROID, LEVOTHROID) 100 MCG tablet TAKE 1 TABLET (100 MCG TOTAL) BY MOUTH DAILY BEFORE BREAKFAST.  Marland Kitchen losartan (COZAAR) 100 MG tablet TAKE 1/2 TO 1 TABLET DAILY FOR BLOOD PRESSURE  . Magnesium 400 MG CAPS Take 400 mg by mouth 5 (five) times daily.   . metFORMIN (GLUCOPHAGE-XR) 500 MG 24 hr tablet TAKE 1 TABLET WITH BREAKFAST AND LUNCH & 2 TABLETS AT SUPPER FOR DIABETES  . metoprolol tartrate (LOPRESSOR) 25 MG tablet TAKE 1 TABLET (25 MG TOTAL) 2 (TWO) TIMES DAILY BY MOUTH.  . nitroGLYCERIN (NITROSTAT) 0.4 MG SL tablet Place 1 tablet (0.4 mg total) every 5 (five) minutes as needed under the tongue.  . Omega-3 Fatty Acids (FISH OIL PO) Take 2,000 mg by mouth daily.  Marland Kitchen omeprazole (PRILOSEC)  40 MG capsule Take 1 capsule daily for acid Indigestion & Reflux  . phentermine (ADIPEX-P) 37.5 MG tablet Take 1/2 to 1 tablet every morning for dieting / weight loss and Mood  . potassium chloride SA (K-DUR,KLOR-CON) 20 MEQ tablet Take 20 mEq by mouth daily.   . predniSONE (DELTASONE) 20 MG tablet 1 tab 3 x day for 3 days, then 1 tab 2 x day for 3 days, then 1 tab 1 x day for 5 days  . ticagrelor (BRILINTA) 90 MG TABS tablet Take 1 tablet (90 mg total) 2 (two) times daily by mouth.  . Vitamin D, Ergocalciferol, (DRISDOL) 50000 UNITS CAPS capsule Take 1 capsule (50,000 Units total) by mouth 3 (three) times a week.  . benzonatate (TESSALON) 200 MG capsule Take 1 perle 3 x / day to prevent cough  . furosemide (LASIX) 20 MG tablet Take 1 tablet (20 mg total) by mouth daily as needed for fluid.  Marland Kitchen losartan (COZAAR) 100 MG tablet TAKE 1/2 TO 1 TABLET DAILY FOR BLOOD PRESSURE  . promethazine-phenylephrine (  PROMETHAZINE VC) 6.25-5 MG/5ML SYRP Take 1 to 2 teaspoons every 4 hour if needed for cough & congestion   No current facility-administered medications on file prior to visit.      Allergies:  Allergies  Allergen Reactions  . Lisinopril Cough     Medical History:  Past Medical History:  Diagnosis Date  . Acute inferior myocardial infarction ([place]) 10/24/2017  . Adenomatous colon polyp 02/15/2009  . Bradycardia   . CAD (coronary artery disease)    remote MI in 2004 with PCI; s/p CABG x 3 03/2012  . Depression   . Family history of malignant neoplasm of gastrointestinal tract   . GERD (gastroesophageal reflux disease)   . Hyperlipidemia   . Hypertension   . Hypothyroidism   . Myocardial infarction ([place])    2004  . Obesity   . Prediabetes   . 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 Tobacco abuse 03/28/2012  . Vitamin D deficiency    Family history- Reviewed and unchanged Social history- Reviewed and unchanged   Review of Systems:   Review of Systems  Constitutional: Negative for malaise/fatigue and weight loss.  HENT: Negative for hearing loss and tinnitus.   Eyes: Negative for blurred vision and double vision.  Respiratory: Positive for shortness of breath (exertional). Negative for cough and wheezing.   Cardiovascular: Negative for chest pain, palpitations, orthopnea, claudication and leg swelling.  Gastrointestinal: Negative for abdominal pain, blood in stool, constipation, diarrhea, heartburn, melena, nausea and vomiting.  Genitourinary: Negative.   Musculoskeletal: Negative for joint pain and myalgias.  Skin: Negative for rash.  Neurological: Positive for dizziness (with standing). Negative for tingling, sensory change, weakness and headaches.  Endo/Heme/Allergies: Negative for polydipsia.  Psychiatric/Behavioral: Negative.   All other systems reviewed and are negative.   Physical Exam: BP 104/60   Pulse 65   Temp (!) 97.3 F (36.3 C)   Ht 6\' 3"  (1.905 m)   Wt 236 lb (107 kg)   SpO2 97%   BMI 29.50 kg/m  Wt Readings from Last 3 Encounters:  07/08/18 236 lb (107 kg)  04/18/18 249 lb 9.6 oz (113.2 kg)  03/25/18 247 lb 3.2 oz (112.1 kg)   General Appearance: Well nourished, in no apparent distress. Eyes: PERRLA, EOMs, conjunctiva no swelling or erythema Sinuses: No Frontal/maxillary tenderness ENT/Mouth: Ext aud canals clear, TMs without erythema, bulging. No erythema, swelling, or exudate on post pharynx.  Tonsils not swollen or erythematous. Hearing normal.  Neck: Supple, thyroid normal.  Respiratory: Respiratory effort normal, BS equal bilaterally without rales, rhonchi, wheezing or stridor.  Cardio: RRR with no MRGs. Brisk peripheral pulses without edema.  Abdomen: Soft, + BS.  Non tender, no guarding, rebound, hernias, masses. Lymphatics: Non tender without lymphadenopathy.  Musculoskeletal: Full ROM, 5/5 strength, Normal gait Skin: Warm, dry without rashes, lesions, ecchymosis.  Neuro:  Cranial nerves intact. No cerebellar symptoms.  Psych: Awake and oriented X 3, normal affect, Insight and Judgment appropriate.    Curtis Ribas, NP 4:52 PM [facility]

## 2018-07-08 ENCOUNTER — Encounter: Payer: Self-pay | Admitting: Adult Health

## 2018-07-08 ENCOUNTER — Ambulatory Visit: Payer: 59 | Admitting: Adult Health

## 2018-07-08 VITALS — BP 104/60 | HR 65 | Temp 97.3°F | Ht 75.0 in | Wt 236.0 lb

## 2018-07-08 DIAGNOSIS — I10 Essential (primary) hypertension: Secondary | ICD-10-CM | POA: Diagnosis not present

## 2018-07-08 DIAGNOSIS — N182 Chronic kidney disease, stage 2 (mild): Secondary | ICD-10-CM

## 2018-07-08 DIAGNOSIS — E559 Vitamin D deficiency, unspecified: Secondary | ICD-10-CM | POA: Diagnosis not present

## 2018-07-08 DIAGNOSIS — I5042 Chronic combined systolic (congestive) and diastolic (congestive) heart failure: Secondary | ICD-10-CM

## 2018-07-08 DIAGNOSIS — E782 Mixed hyperlipidemia: Secondary | ICD-10-CM

## 2018-07-08 DIAGNOSIS — Z720 Tobacco use: Secondary | ICD-10-CM | POA: Diagnosis not present

## 2018-07-08 DIAGNOSIS — E039 Hypothyroidism, unspecified: Secondary | ICD-10-CM

## 2018-07-08 DIAGNOSIS — K219 Gastro-esophageal reflux disease without esophagitis: Secondary | ICD-10-CM

## 2018-07-08 DIAGNOSIS — E1122 Type 2 diabetes mellitus with diabetic chronic kidney disease: Secondary | ICD-10-CM | POA: Diagnosis not present

## 2018-07-08 DIAGNOSIS — Z79899 Other long term (current) drug therapy: Secondary | ICD-10-CM

## 2018-07-08 DIAGNOSIS — E669 Obesity, unspecified: Secondary | ICD-10-CM

## 2018-07-08 DIAGNOSIS — I25708 Atherosclerosis of coronary artery bypass graft(s), unspecified, with other forms of angina pectoris: Secondary | ICD-10-CM

## 2018-07-08 NOTE — Patient Instructions (Addendum)
Please monitor your blood pressure. If it is getting below 130/80 AND you are having fatigue with exertion, dizziness we may need to cut your blood pressure medication (losartan) in half. Please call the office if this is happening. Hypotension As your heart beats, it forces blood through your body. This force is called blood pressure. If you have hypotension, you have low blood pressure. When your blood pressure is too low, you may not get enough blood to your brain. You may feel weak, feel lightheaded, have a fast heartbeat, or even pass out (faint). HOME CARE  Drink enough fluids to keep your pee (urine) clear or pale yellow.  Take all medicines as told by your doctor.  Get up slowly after sitting or lying down.  Wear support stockings as told by your doctor.  Maintain a healthy diet by including foods such as fruits, vegetables, nuts, whole grains, and lean meats. GET HELP IF:  You are throwing up (vomiting) or have watery poop (diarrhea).  You have a fever for more than 2-3 days.  You feel more thirsty than usual.  You feel weak and tired. GET HELP RIGHT AWAY IF:   You pass out (faint).  You have chest pain or a fast or irregular heartbeat.  You lose feeling in part of your body.  You cannot move your arms or legs.  You have trouble speaking.  You get sweaty or feel lightheaded. MAKE SURE YOU:   Understand these instructions.  Will watch your condition.  Will get help right away if you are not doing well or get worse. Document Released: 02/28/2010 Document Revised: 08/06/2013 Document Reviewed: 06/06/2013 [facility] Patient Information 2015 [place], [place]. This information is not intended to replace advice given to you by your health care provider. Make sure you discuss any questions you have with your health care provider.   Hypoglycemia Hypoglycemia occurs when the level of sugar (glucose) in the blood is too low. Glucose is a type of sugar that provides  the body's main source of energy. Certain hormones (insulin and glucagon) control the level of glucose in the blood. Insulin lowers blood glucose, and glucagon increases blood glucose. Hypoglycemia can result from having too much insulin in the bloodstream, or from not eating enough food that contains glucose. Hypoglycemia can happen in people who do or do not have diabetes. It can develop quickly, and it can be a medical emergency. What are the causes? Hypoglycemia occurs most often in people who have diabetes. If you have diabetes, hypoglycemia may be caused by:  Diabetes medicine.  Not eating enough, or not eating often enough.  Increased physical activity.  Drinking alcohol, especially when you have not eaten recently.  If you do not have diabetes, hypoglycemia may be caused by:  A tumor in the pancreas. The pancreas is the organ that makes insulin.  Not eating enough, or not eating for long periods at a time (fasting).  Severe infection or illness that affects the liver, heart, or kidneys.  Certain medicines.  You may also have reactive hypoglycemia. This condition causes hypoglycemia within 4 hours of eating a meal. This may occur after having stomach surgery. Sometimes, the cause of reactive hypoglycemia is not known. What increases the risk? Hypoglycemia is more likely to develop in:  People who have diabetes and take medicines to lower blood glucose.  People who abuse alcohol.  People who have a severe illness.  What are the signs or symptoms? Hypoglycemia may not cause any symptoms.  If you have symptoms, they may include:  Hunger.  Anxiety.  Sweating and feeling clammy.  Confusion.  Dizziness or feeling light-headed.  Sleepiness.  Nausea.  Increased heart rate.  Headache.  Blurry vision.  Seizure.  Nightmares.  Tingling or numbness around the mouth, lips, or tongue.  A change in speech.  Decreased ability to concentrate.  A change in  coordination.  Restless sleep.  Tremors or shakes.  Fainting.  Irritability.  How is this diagnosed? Hypoglycemia is diagnosed with a blood test to measure your blood glucose level. This blood test is done while you are having symptoms. Your health care provider may also do a physical exam and review your medical history. If you do not have diabetes, other tests may be done to find the cause of your hypoglycemia. How is this treated? This condition can often be treated by immediately eating or drinking something that contains glucose, such as:  3-4 sugar tablets (glucose pills).  Glucose gel, 15-gram tube.  Fruit juice, 4 oz (120 mL).  Regular soda (not diet soda), 4 oz (120 mL).  Low-fat milk, 4 oz (120 mL).  Several pieces of hard candy.  Sugar or honey, 1 Tbsp.  Treating Hypoglycemia If You Have Diabetes  If you are alert and able to swallow safely, follow the 15:15 rule:  Take 15 grams of a rapid-acting carbohydrate. Rapid-acting options include: ? 1 tube of glucose gel. ? 3 glucose pills. ? 6-8 pieces of hard candy. ? 4 oz (120 mL) of fruit juice. ? 4 oz (120 ml) of regular (not diet) soda.  Check your blood glucose 15 minutes after you take the carbohydrate.  If the repeat blood glucose level is still at or below 70 mg/dL (3.9 mmol/L), take 15 grams of a carbohydrate again.  If your blood glucose level does not increase above 70 mg/dL (3.9 mmol/L) after 3 tries, seek emergency medical care.  After your blood glucose level returns to normal, eat a meal or a snack within 1 hour.  Treating Severe Hypoglycemia Severe hypoglycemia is when your blood glucose level is at or below 54 mg/dL (3 mmol/L). Severe hypoglycemia is an emergency. Do not wait to see if the symptoms will go away. Get medical help right away. Call your local emergency services (911 in the [place] Do not drive yourself to the hospital. If you have severe hypoglycemia and you cannot eat or drink,  you may need an injection of glucagon. A family member or close friend should learn how to check your blood glucose and how to give you a glucagon injection. Ask your health care provider if you need to have an emergency glucagon injection kit available. Severe hypoglycemia may need to be treated in a hospital. The treatment may include getting glucose through an IV tube. You may also need treatment for the cause of your hypoglycemia. Follow these instructions at home: General instructions  Avoid any diets that cause you to not eat enough food. Talk with your health care provider before you start any new diet.  Take over-the-counter and prescription medicines only as told by your health care provider.  Limit alcohol intake to no more than 1 drink per day for nonpregnant women and 2 drinks per day for men. One drink equals 12 oz of beer, 5 oz of wine, or 1 oz of hard liquor.  Keep all follow-up visits as told by your health care provider. This is important. If You Have Diabetes:   Make sure you know  the symptoms of hypoglycemia.  Always have a rapid-acting carbohydrate snack with you to treat low blood sugar.  Follow your diabetes management plan, as told by your health care provider. Make sure you: ? Take your medicines as directed. ? Follow your exercise plan. ? Follow your meal plan. Eat on time, and do not skip meals. ? Check your blood glucose as often as directed. Make sure to check your blood glucose before and after exercise. If you exercise longer or in a different way than usual, check your blood glucose more often. ? Follow your sick day plan whenever you cannot eat or drink normally. Make this plan in advance with your health care provider.  Share your diabetes management plan with people in your workplace, school, and household.  Check your urine for ketones when you are ill and as told by your health care provider.  Carry a medical alert card or wear medical alert  jewelry. If You Have Reactive Hypoglycemia or Low Blood Sugar From Other Causes:  Monitor your blood glucose as told by your health care provider.  Follow instructions from your health care provider about eating or drinking restrictions. Contact a health care provider if:  You have problems keeping your blood glucose in your target range.  You have frequent episodes of hypoglycemia. Get help right away if:  You continue to have hypoglycemia symptoms after eating or drinking something containing glucose.  Your blood glucose is at or below 54 mg/dL (3 mmol/L).  You have a seizure.  You faint. These symptoms may represent a serious problem that is an emergency. Do not wait to see if the symptoms will go away. Get medical help right away. Call your local emergency services (911 in the [place] Do not drive yourself to the hospital. This information is not intended to replace advice given to you by your health care provider. Make sure you discuss any questions you have with your health care provider. Document Released: 12/04/2005 Document Revised: 05/17/2016 Document Reviewed: 01/07/2016 Elsevier Interactive Patient Education  [employer].

## 2018-07-09 LAB — CBC WITH DIFFERENTIAL/PLATELET
Basophils Absolute: 62 cells/uL (ref 0–200)
Basophils Relative: 1 %
Eosinophils Absolute: 130 cells/uL (ref 15–500)
Eosinophils Relative: 2.1 %
HCT: 41.8 % (ref 38.5–50.0)
Hemoglobin: 14.2 g/dL (ref 13.2–17.1)
Lymphs Abs: 1079 cells/uL (ref 850–3900)
MCH: 29.4 pg (ref 27.0–33.0)
MCHC: 34 g/dL (ref 32.0–36.0)
MCV: 86.5 fL (ref 80.0–100.0)
MPV: 11.9 fL (ref 7.5–12.5)
Monocytes Relative: 6.9 %
Neutro Abs: 4501 cells/uL (ref 1500–7800)
Neutrophils Relative %: 72.6 %
Platelets: 266 10*3/uL (ref 140–400)
RBC: 4.83 10*6/uL (ref 4.20–5.80)
RDW: 14.2 % (ref 11.0–15.0)
Total Lymphocyte: 17.4 %
WBC mixed population: 428 cells/uL (ref 200–950)
WBC: 6.2 10*3/uL (ref 3.8–10.8)

## 2018-07-09 LAB — LIPID PANEL
Cholesterol: 190 mg/dL (ref <200)
HDL: 51 mg/dL (ref >40)
LDL Cholesterol (Calc): 103 mg/dL (calc) — ABNORMAL HIGH
Non-HDL Cholesterol (Calc): 139 mg/dL (calc) — ABNORMAL HIGH (ref <130)
Total CHOL/HDL Ratio: 3.7 (calc) (ref <5.0)
Triglycerides: 251 mg/dL — ABNORMAL HIGH (ref <150)

## 2018-07-09 LAB — HEMOGLOBIN A1C
Hgb A1c MFr Bld: 6.3 % of total Hgb — ABNORMAL HIGH (ref <5.7)
Mean Plasma Glucose: 134 (calc)
eAG (mmol/L): 7.4 (calc)

## 2018-07-09 LAB — COMPLETE METABOLIC PANEL WITH GFR
AG Ratio: 1.8 (calc) (ref 1.0–2.5)
ALT: 7 U/L — ABNORMAL LOW (ref 9–46)
AST: 9 U/L — ABNORMAL LOW (ref 10–35)
Albumin: 4.2 g/dL (ref 3.6–5.1)
Alkaline phosphatase (APISO): 92 U/L (ref 40–115)
BUN: 11 mg/dL (ref 7–25)
CO2: 31 mmol/L (ref 20–32)
Calcium: 9.6 mg/dL (ref 8.6–10.3)
Chloride: 95 mmol/L — ABNORMAL LOW (ref 98–110)
Creat: 1.18 mg/dL (ref 0.70–1.25)
GFR, Est African American: 75 mL/min/{1.73_m2} (ref >=60)
GFR, Est Non African American: 65 mL/min/{1.73_m2} (ref >=60)
Globulin: 2.3 g/dL (calc) (ref 1.9–3.7)
Glucose, Bld: 104 mg/dL — ABNORMAL HIGH (ref 65–99)
Potassium: 4.5 mmol/L (ref 3.5–5.3)
Sodium: 134 mmol/L — ABNORMAL LOW (ref 135–146)
Total Bilirubin: 0.8 mg/dL (ref 0.2–1.2)
Total Protein: 6.5 g/dL (ref 6.1–8.1)

## 2018-07-09 LAB — TSH: TSH: 1.01 mIU/L (ref 0.40–4.50)

## 2018-07-09 LAB — MAGNESIUM: Magnesium: 1.3 mg/dL — ABNORMAL LOW (ref 1.5–2.5)

## 2018-07-27 ENCOUNTER — Other Ambulatory Visit: Payer: Self-pay | Admitting: Internal Medicine

## 2018-09-05 ENCOUNTER — Other Ambulatory Visit: Payer: Self-pay | Admitting: Internal Medicine

## 2018-09-05 DIAGNOSIS — E785 Hyperlipidemia, unspecified: Secondary | ICD-10-CM

## 2018-09-11 ENCOUNTER — Encounter: Payer: Self-pay | Admitting: Internal Medicine

## 2018-10-02 ENCOUNTER — Other Ambulatory Visit: Payer: Self-pay | Admitting: Internal Medicine

## 2018-10-13 ENCOUNTER — Encounter: Payer: Self-pay | Admitting: Internal Medicine

## 2018-10-13 NOTE — Progress Notes (Signed)
       R  E  S  C  H  E  D  U  L  E  D                                                                                                     This very nice 67 y.o. DWM  presents for a Screening /Preventative Visit & comprehensive evaluation and management of multiple medical co-morbidities.  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 Patient has GERD controlled with his meds     HTN predates since 2005 when he 1st presented w/ACS & had PCA/Stent. In 2013, he presented again  ACS and underwent CABG.  In Nov 2018, patient had an acute MI and had a DES placed in the prox Circ by Dr Acie Fredrickson. He also has hx/o combined Sys/Dias CHF Patient's BP has been controlled at home.  Today's  . Patient denies any cardiac symptoms as chest pain, palpitations, dizziness or ankle swelling, but does have shortness of breath.     Patient's hyperlipidemia is controlled with diet and medications. Patient denies myalgias or other medication SE's. Last lipids were  Lab Results  Component Value Date   CHOL 190 07/08/2018   HDL 51 07/08/2018   LDLCALC 103 (H) 07/08/2018   TRIG 251 (H) 07/08/2018   CHOLHDL 3.7 07/08/2018      Patient has hx/o Morbid Obesity (BMI 34+) and was Prediabetic (A1c 6.2%) in 2011 and then in 2016 transitioned to DM/CKD2 with A1c 6.6%  and patient denies reactive hypoglycemic symptoms, visual blurring, diabetic polys or paresthesias. Last A1c was not at goal: Lab Results  Component Value Date   HGBA1C 6.3 (H) 07/08/2018       Patient has been on Thyroid replacement since 2015.      Finally, patient has history of Vitamin D Deficiency ("6" / 2008) and last vitamin D was at goal:

## 2018-10-14 ENCOUNTER — Ambulatory Visit: Payer: Self-pay | Admitting: Internal Medicine

## 2018-10-15 ENCOUNTER — Emergency Department (HOSPITAL_COMMUNITY): Payer: 59

## 2018-10-15 ENCOUNTER — Observation Stay (HOSPITAL_COMMUNITY)
Admission: EM | Admit: 2018-10-15 | Discharge: 2018-10-16 | Disposition: A | Discharge status: Home / Self Care | Payer: 59 | Attending: Cardiovascular Disease | Admitting: Cardiovascular Disease

## 2018-10-15 ENCOUNTER — Ambulatory Visit (INDEPENDENT_AMBULATORY_CARE_PROVIDER_SITE_OTHER): Payer: 59 | Admitting: Internal Medicine

## 2018-10-15 ENCOUNTER — Encounter: Payer: Self-pay | Admitting: Internal Medicine

## 2018-10-15 ENCOUNTER — Other Ambulatory Visit: Payer: Self-pay

## 2018-10-15 ENCOUNTER — Encounter (HOSPITAL_COMMUNITY): Payer: Self-pay

## 2018-10-15 VITALS — BP 136/80 | HR 68 | Temp 97.9°F | Resp 18 | Ht 75.0 in | Wt 246.6 lb

## 2018-10-15 DIAGNOSIS — Z951 Presence of aortocoronary bypass graft: Secondary | ICD-10-CM | POA: Diagnosis not present

## 2018-10-15 DIAGNOSIS — I4891 Unspecified atrial fibrillation: Principal | ICD-10-CM | POA: Diagnosis present

## 2018-10-15 DIAGNOSIS — E039 Hypothyroidism, unspecified: Secondary | ICD-10-CM | POA: Diagnosis not present

## 2018-10-15 DIAGNOSIS — Z6830 Body mass index (BMI) 30.0-30.9, adult: Secondary | ICD-10-CM | POA: Insufficient documentation

## 2018-10-15 DIAGNOSIS — N179 Acute kidney failure, unspecified: Secondary | ICD-10-CM | POA: Insufficient documentation

## 2018-10-15 DIAGNOSIS — E785 Hyperlipidemia, unspecified: Secondary | ICD-10-CM | POA: Insufficient documentation

## 2018-10-15 DIAGNOSIS — K219 Gastro-esophageal reflux disease without esophagitis: Secondary | ICD-10-CM | POA: Diagnosis not present

## 2018-10-15 DIAGNOSIS — E119 Type 2 diabetes mellitus without complications: Secondary | ICD-10-CM | POA: Insufficient documentation

## 2018-10-15 DIAGNOSIS — I509 Heart failure, unspecified: Secondary | ICD-10-CM

## 2018-10-15 DIAGNOSIS — Z79899 Other long term (current) drug therapy: Secondary | ICD-10-CM | POA: Diagnosis not present

## 2018-10-15 DIAGNOSIS — I252 Old myocardial infarction: Secondary | ICD-10-CM | POA: Diagnosis not present

## 2018-10-15 DIAGNOSIS — E876 Hypokalemia: Secondary | ICD-10-CM | POA: Diagnosis not present

## 2018-10-15 DIAGNOSIS — I110 Hypertensive heart disease with heart failure: Secondary | ICD-10-CM | POA: Insufficient documentation

## 2018-10-15 DIAGNOSIS — Z7982 Long term (current) use of aspirin: Secondary | ICD-10-CM | POA: Insufficient documentation

## 2018-10-15 DIAGNOSIS — Z955 Presence of coronary angioplasty implant and graft: Secondary | ICD-10-CM | POA: Insufficient documentation

## 2018-10-15 DIAGNOSIS — Z7984 Long term (current) use of oral hypoglycemic drugs: Secondary | ICD-10-CM | POA: Diagnosis not present

## 2018-10-15 DIAGNOSIS — E669 Obesity, unspecified: Secondary | ICD-10-CM | POA: Insufficient documentation

## 2018-10-15 DIAGNOSIS — I25700 Atherosclerosis of coronary artery bypass graft(s), unspecified, with unstable angina pectoris: Secondary | ICD-10-CM | POA: Diagnosis not present

## 2018-10-15 DIAGNOSIS — E559 Vitamin D deficiency, unspecified: Secondary | ICD-10-CM | POA: Insufficient documentation

## 2018-10-15 DIAGNOSIS — F329 Major depressive disorder, single episode, unspecified: Secondary | ICD-10-CM | POA: Diagnosis not present

## 2018-10-15 DIAGNOSIS — I5023 Acute on chronic systolic (congestive) heart failure: Secondary | ICD-10-CM | POA: Insufficient documentation

## 2018-10-15 DIAGNOSIS — I10 Essential (primary) hypertension: Secondary | ICD-10-CM | POA: Diagnosis present

## 2018-10-15 DIAGNOSIS — Z136 Encounter for screening for cardiovascular disorders: Secondary | ICD-10-CM

## 2018-10-15 DIAGNOSIS — I25118 Atherosclerotic heart disease of native coronary artery with other forms of angina pectoris: Secondary | ICD-10-CM | POA: Diagnosis present

## 2018-10-15 DIAGNOSIS — R0602 Shortness of breath: Secondary | ICD-10-CM | POA: Diagnosis not present

## 2018-10-15 LAB — BASIC METABOLIC PANEL
Anion gap: 13 (ref 5–15)
BUN: 14 mg/dL (ref 8–23)
CO2: 25 mmol/L (ref 22–32)
Calcium: 9 mg/dL (ref 8.9–10.3)
Chloride: 96 mmol/L — ABNORMAL LOW (ref 98–111)
Creatinine, Ser: 1.38 mg/dL — ABNORMAL HIGH (ref 0.61–1.24)
GFR calc Af Amer: >60 mL/min (ref >60)
GFR calc non Af Amer: 53 mL/min — ABNORMAL LOW (ref >60)
Glucose, Bld: 113 mg/dL — ABNORMAL HIGH (ref 70–99)
Potassium: 5.3 mmol/L — ABNORMAL HIGH (ref 3.5–5.1)
Sodium: 134 mmol/L — ABNORMAL LOW (ref 135–145)

## 2018-10-15 LAB — TSH: TSH: 1.688 u[IU]/mL (ref 0.350–4.500)

## 2018-10-15 LAB — URINALYSIS, ROUTINE W REFLEX MICROSCOPIC
Bilirubin Urine: NEGATIVE
Glucose, UA: NEGATIVE mg/dL
Hgb urine dipstick: NEGATIVE
Ketones, ur: NEGATIVE mg/dL
Leukocytes, UA: NEGATIVE
Nitrite: NEGATIVE
Protein, ur: NEGATIVE mg/dL
Specific Gravity, Urine: 1.005 (ref 1.005–1.030)
pH: 7 (ref 5.0–8.0)

## 2018-10-15 LAB — CBC
HCT: 38.8 % — ABNORMAL LOW (ref 39.0–52.0)
Hemoglobin: 12.4 g/dL — ABNORMAL LOW (ref 13.0–17.0)
MCH: 27.4 pg (ref 26.0–34.0)
MCHC: 32 g/dL (ref 30.0–36.0)
MCV: 85.7 fL (ref 80.0–100.0)
Platelets: 306 10*3/uL (ref 150–400)
RBC: 4.53 MIL/uL (ref 4.22–5.81)
RDW: 14.1 % (ref 11.5–15.5)
WBC: 9.2 10*3/uL (ref 4.0–10.5)
nRBC: 0 % (ref 0.0–0.2)

## 2018-10-15 LAB — MAGNESIUM: Magnesium: 1.5 mg/dL — ABNORMAL LOW (ref 1.7–2.4)

## 2018-10-15 LAB — HEPATIC FUNCTION PANEL
ALT: 23 U/L (ref 0–44)
AST: 23 U/L (ref 15–41)
Albumin: 3.2 g/dL — ABNORMAL LOW (ref 3.5–5.0)
Alkaline Phosphatase: 98 U/L (ref 38–126)
Bilirubin, Direct: 0.4 mg/dL — ABNORMAL HIGH (ref 0.0–0.2)
Indirect Bilirubin: 1.9 mg/dL — ABNORMAL HIGH (ref 0.3–0.9)
Total Bilirubin: 2.3 mg/dL — ABNORMAL HIGH (ref 0.3–1.2)
Total Protein: 5.6 g/dL — ABNORMAL LOW (ref 6.5–8.1)

## 2018-10-15 LAB — LIPASE, BLOOD: Lipase: 19 U/L (ref 11–51)

## 2018-10-15 LAB — I-STAT TROPONIN, ED
Troponin i, poc: 0.04 ng/mL (ref 0.00–0.08)
Troponin i, poc: 0.05 ng/mL (ref 0.00–0.08)

## 2018-10-15 LAB — BRAIN NATRIURETIC PEPTIDE: B Natriuretic Peptide: 978.3 pg/mL — ABNORMAL HIGH (ref 0.0–100.0)

## 2018-10-15 MED ORDER — ONDANSETRON HCL 4 MG/2ML IJ SOLN: 4.0000 mg | Freq: Four times a day (QID) | INTRAMUSCULAR | Status: DC | PRN

## 2018-10-15 MED ORDER — ACETAMINOPHEN 325 MG PO TABS: 650.0000 mg | ORAL_TABLET | ORAL | Status: DC | PRN

## 2018-10-15 MED ORDER — EZETIMIBE 10 MG PO TABS
10.0000 mg | ORAL_TABLET | Freq: Every day | ORAL | Status: DC
  Administered 2018-10-16: 10 mg via ORAL
  Filled 2018-10-15: qty 1

## 2018-10-15 MED ORDER — ATORVASTATIN CALCIUM 80 MG PO TABS
80.0000 mg | ORAL_TABLET | Freq: Every day | ORAL | Status: DC
  Administered 2018-10-15 – 2018-10-16 (×2): 80 mg via ORAL
  Filled 2018-10-15 (×2): qty 1

## 2018-10-15 MED ORDER — ISOSORBIDE MONONITRATE ER 30 MG PO TB24
30.0000 mg | ORAL_TABLET | Freq: Every day | ORAL | Status: DC
  Administered 2018-10-16: 30 mg via ORAL
  Filled 2018-10-15: qty 1

## 2018-10-15 MED ORDER — METOPROLOL TARTRATE 25 MG PO TABS
25.0000 mg | ORAL_TABLET | Freq: Two times a day (BID) | ORAL | Status: DC
  Administered 2018-10-15 – 2018-10-16 (×2): 25 mg via ORAL
  Filled 2018-10-15 (×2): qty 1

## 2018-10-15 MED ORDER — FUROSEMIDE 10 MG/ML IJ SOLN
40.0000 mg | Freq: Every day | INTRAMUSCULAR | Status: DC
  Administered 2018-10-15: 40 mg via INTRAVENOUS
  Filled 2018-10-15: qty 4

## 2018-10-15 MED ORDER — LOSARTAN POTASSIUM 50 MG PO TABS
50.0000 mg | ORAL_TABLET | Freq: Every day | ORAL | Status: DC
  Administered 2018-10-16: 50 mg via ORAL
  Filled 2018-10-15: qty 1

## 2018-10-15 MED ORDER — ASPIRIN 81 MG PO CHEW
81.0000 mg | CHEWABLE_TABLET | Freq: Every day | ORAL | Status: DC
  Administered 2018-10-16: 81 mg via ORAL
  Filled 2018-10-15: qty 1

## 2018-10-15 MED ORDER — APIXABAN 5 MG PO TABS
5.0000 mg | ORAL_TABLET | Freq: Two times a day (BID) | ORAL | Status: DC
  Administered 2018-10-15 – 2018-10-16 (×2): 5 mg via ORAL
  Filled 2018-10-15 (×2): qty 1

## 2018-10-15 NOTE — ED Triage Notes (Signed)
Pt brought in by [facility] from doctors office. MD at doctors office reported pt was very SOB and when placed on their monitor he had new onset afib. Pt states he has been SOB x1 week. A&O x4. Pt does have hx of CHF and MI x2. Pt states he ran out of his lasix for 1 week without knowing. Pt is a DNR pt.  138/98 BP, 74 HR, 129 CBG, 96% RA

## 2018-10-15 NOTE — Plan of Care (Signed)
Will cont with poc 

## 2018-10-15 NOTE — ED Notes (Signed)
ED TO INPATIENT HANDOFF REPORT  Name/Age/Gender Curtis Perez 67 y.o. male  Code Status Code Status History    Date Active Date Inactive Code Status Order ID Comments User Context   10/25/2017 2025 10/26/2017 1709 DNR [id number]  Pixie Casino, MD Inpatient   10/24/2017 0245 10/25/2017 2025 Full Code [id number]  Jettie Booze, MD Inpatient   08/08/2014 0528 08/10/2014 1704 Full Code [id number]  Etta Quill, DO ED   04/03/2012 0813 04/09/2012 2054 Full Code [id number]  Rexene Alberts, MD Inpatient   04/02/2012 1256 04/03/2012 0810 Full Code [id number]  Lunette Stands, RN Inpatient    Questions for Most Recent Historical Code Status (Order [id number])    Question Answer Comment   In the event of cardiac or respiratory ARREST Do not call a "code blue"    In the event of cardiac or respiratory ARREST Do not perform Intubation, CPR, defibrillation or ACLS    In the event of cardiac or respiratory ARREST Use medication by any route, position, wound care, and other measures to relive pain and suffering. May use oxygen, suction and manual treatment of airway obstruction as needed for comfort.       Home/SNF/Other Home  Chief Complaint AFIB  Level of Care/Admitting Diagnosis ED Disposition    ED Disposition Condition Comment   Admit  Hospital Area: [facility] [[id number]]  Level of Care: Telemetry [5]  Diagnosis: Atrial fibrillation ([place]) [427.31.ICD-9-CM]  Admitting Physician: Burnell Blanks [[id number]]  Attending Physician: Lauree Chandler D [[id number]]  PT Class (Do Not Modify): Observation [104]  PT Acc Code (Do Not Modify): Observation [10022]       Medical History Past Medical History:  Diagnosis Date  . Acute inferior myocardial infarction ([place]) 10/24/2017  . Adenomatous colon polyp 02/15/2009  . Bradycardia   . CAD (coronary artery disease)    remote MI in 2004 with PCI; s/p CABG x 3 03/2012  . Depression   . Family history of malignant neoplasm  of gastrointestinal tract   . GERD (gastroesophageal reflux disease)   . Hyperlipidemia   . Hypertension   . Hypothyroidism   . Myocardial infarction ([place])    2004  . Obesity   . Prediabetes   . 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 Tobacco abuse 03/28/2012  . Vitamin D deficiency     Allergies Allergies  Allergen Reactions  . Lisinopril Cough    IV Location/Drains/Wounds Patient Lines/Drains/Airways Status   Active Line/Drains/Airways    Name:   Placement date:   Placement time:   Site:   Days:   Peripheral IV 11/16/17 Right Antecubital   11/16/17    0843    Antecubital   333   Peripheral IV 10/15/18 Right Hand   10/15/18    1124    Hand   less than 1   Incision 04/02/12 Chest   04/02/12    2458     2387   Incision 04/02/12 Leg Right   04/02/12    0998     2387          Labs/Imaging Results for orders placed or performed during the hospital encounter of 10/15/18 (from the past 48 hour(s))  Basic metabolic panel     Status: Abnormal   Collection Time: 10/15/18 11:40 AM  Result Value Ref Range   Sodium 134 (L) 135 - 145 mmol/L  Potassium 5.3 (H) 3.5 - 5.1 mmol/L    Comment: HEMOLYSIS AT THIS LEVEL MAY AFFECT RESULT   Chloride 96 (L) 98 - 111 mmol/L   CO2 25 22 - 32 mmol/L   Glucose, Bld 113 (H) 70 - 99 mg/dL   BUN 14 8 - 23 mg/dL   Creatinine, Ser 1.38 (H) 0.61 - 1.24 mg/dL   Calcium 9.0 8.9 - 10.3 mg/dL   GFR calc non Af Amer 53 (L) >60 mL/min   GFR calc Af Amer >60 >60 mL/min    Comment: (NOTE) The eGFR has been calculated using the CKD EPI equation. This calculation has not been validated in all clinical situations. eGFR's persistently <60 mL/min signify possible Chronic Kidney Disease.    Anion gap 13 5 - 15    Comment: Performed at [facility] [address] [place], [place] [address]  CBC     Status: Abnormal   Collection Time: 10/15/18 11:40 AM  Result Value Ref Range   WBC 9.2 4.0 -  10.5 K/uL   RBC 4.53 4.22 - 5.81 MIL/uL   Hemoglobin 12.4 (L) 13.0 - 17.0 g/dL   HCT 38.8 (L) 39.0 - 52.0 %   MCV 85.7 80.0 - 100.0 fL   MCH 27.4 26.0 - 34.0 pg   MCHC 32.0 30.0 - 36.0 g/dL   RDW 14.1 11.5 - 15.5 %   Platelets 306 150 - 400 K/uL   nRBC 0.0 0.0 - 0.2 %    Comment: Performed at [facility], [place] [address], [place], [place] [address]  I-stat troponin, ED     Status: None   Collection Time: 10/15/18 12:34 PM  Result Value Ref Range   Troponin i, poc 0.04 0.00 - 0.08 ng/mL   Comment 3            Comment: Due to the release kinetics of cTnI, a negative result within the first hours of the onset of symptoms does not rule out myocardial infarction with certainty. If myocardial infarction is still suspected, repeat the test at appropriate intervals.   Urinalysis, Routine w reflex microscopic     Status: Abnormal   Collection Time: 10/15/18 12:36 PM  Result Value Ref Range   Color, Urine STRAW (A) YELLOW   APPearance CLEAR CLEAR   Specific Gravity, Urine 1.005 1.005 - 1.030   pH 7.0 5.0 - 8.0   Glucose, UA NEGATIVE NEGATIVE mg/dL   Hgb urine dipstick NEGATIVE NEGATIVE   Bilirubin Urine NEGATIVE NEGATIVE   Ketones, ur NEGATIVE NEGATIVE mg/dL   Protein, ur NEGATIVE NEGATIVE mg/dL   Nitrite NEGATIVE NEGATIVE   Leukocytes, UA NEGATIVE NEGATIVE    Comment: Performed at [facility] [address] [place], [place] [address]  Hepatic function panel     Status: Abnormal   Collection Time: 10/15/18 12:38 PM  Result Value Ref Range   Total Protein 5.6 (L) 6.5 - 8.1 g/dL   Albumin 3.2 (L) 3.5 - 5.0 g/dL   AST 23 15 - 41 U/L   ALT 23 0 - 44 U/L   Alkaline Phosphatase 98 38 - 126 U/L   Total Bilirubin 2.3 (H) 0.3 - 1.2 mg/dL   Bilirubin, Direct 0.4 (H) 0.0 - 0.2 mg/dL   Indirect Bilirubin 1.9 (H) 0.3 - 0.9 mg/dL    Comment: Performed at [facility] [address] [place], [place] [address]  Lipase, blood     Status: None   Collection Time: 10/15/18  12:38 PM  Result Value Ref Range   Lipase 19 11 - 51 U/L    Comment: Performed at [facility], [place] [address], [place], [place] [address]  Magnesium     Status: Abnormal   Collection Time: 10/15/18 12:38 PM  Result Value Ref Range   Magnesium 1.5 (L) 1.7 - 2.4 mg/dL    Comment: Performed at [facility] [address], [place], [place] [address]  Brain natriuretic peptide     Status: Abnormal   Collection Time: 10/15/18  1:48 PM  Result Value Ref Range   B Natriuretic Peptide 978.3 (H) 0.0 - 100.0 pg/mL    Comment: Performed at [facility] [address] [place], [place] [address]  TSH     Status: None   Collection Time: 10/15/18  1:48 PM  Result Value Ref Range   TSH 1.688 0.350 - 4.500 uIU/mL    Comment: Performed by a 3rd Generation assay with a functional sensitivity of <=0.01 uIU/mL. Performed at [facility], [place] [address], [place], [place] [address]   I-stat troponin, ED     Status: None   Collection Time: 10/15/18  4:01 PM  Result Value Ref Range   Troponin i, poc 0.05 0.00 - 0.08 ng/mL   Comment 3            Comment: Due to the release kinetics of cTnI, a negative result within the first hours of the onset of symptoms does not rule out myocardial infarction with certainty. If myocardial infarction is still suspected, repeat the test at appropriate intervals.    Dg Chest 2 View  Result Date: 10/15/2018 CLINICAL DATA:  Exertional shortness of breath since being off of Lasix for the past week. Abnormal breath sounds in the lungs. New onset of atrial fibrillation. History of CABG, current smoker, diabetes. EXAM: CHEST - 2 VIEW COMPARISON:  PA and lateral chest x-ray of November 16, 2017 FINDINGS: The lungs are well-expanded. The interstitial markings are coarse. There is no alveolar infiltrate or pleural effusion. The heart is normal in size. The pulmonary vascularity is not engorged. There are post CABG changes. There is faint calcification in  the wall of the aortic arch. The bony thorax exhibits no acute abnormality. IMPRESSION: COPD.  No alveolar pneumonia nor pulmonary edema.  Previous CABG. Thoracic aortic atherosclerosis. Electronically Signed   By: David  Martinique M.D.   On: 10/15/2018 13:28    Pending Labs [employer] (From admission, onward)    Start     Ordered   Signed and Held  HIV antibody (Routine Testing)  Once,   R     Signed and Held   Signed and Held  Basic metabolic panel  Tomorrow morning,   R     Signed and Held   Signed and Held  CBC  Tomorrow morning,   R     Signed and Held          Vitals/Pain Today's Vitals   10/15/18 1645 10/15/18 1656 10/15/18 1700 10/15/18 1715  BP: (!) 163/87  136/89 (!) 143/78  Pulse: 70  70 69  Resp: (!) 21  (!) 22 (!) 21  SpO2: 99%  98% 99%  Weight:      Height:      PainSc:  0-No pain      Isolation Precautions No active isolations  Medications Medications - No data to display  Mobility walks

## 2018-10-15 NOTE — ED Provider Notes (Signed)
[facility] EMERGENCY DEPARTMENT Provider Note   CSN: [id number] Arrival date & time: 10/15/18  1120     History   Chief Complaint Chief Complaint  Patient presents with  . Atrial Fibrillation  . Shortness of Breath    HPI Curtis Perez is a 67 y.o. male.  The history is provided by the patient and medical records. No language interpreter was used.  Shortness of Breath  This is a new problem. The average episode lasts 1 week. The problem occurs continuously.The current episode started more than 1 week ago. The problem has not changed since onset.Pertinent negatives include no fever, no headaches, no coryza, no rhinorrhea, no ear pain, no neck pain, no cough, no sputum production, no hemoptysis, no wheezing, no chest pain, no syncope, no vomiting, no abdominal pain, no rash and no leg swelling. He has tried nothing for the symptoms. The treatment provided no relief. Associated medical issues include CAD and heart failure.    Past Medical History:  Diagnosis Date  . Acute inferior myocardial infarction ([place]) 10/24/2017  . Adenomatous colon polyp 02/15/2009  . Bradycardia   . CAD (coronary artery disease)    remote MI in 2004 with PCI; s/p CABG x 3 03/2012  . Depression   . Family history of malignant neoplasm of gastrointestinal tract   . GERD (gastroesophageal reflux disease)   . Hyperlipidemia   . Hypertension   . Hypothyroidism   . Myocardial infarction ([place])    2004  . Obesity   . Prediabetes   . 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 Tobacco abuse 03/28/2012  . Vitamin D deficiency     Patient Active Problem List   Diagnosis Date Noted  . Hypothyroidism 11/18/2017  . Chronic combined systolic and diastolic CHF (congestive heart failure) ([place]) 11/18/2017  . Tobacco abuse counseling   . Coronary artery disease involving coronary bypass graft of native heart with unstable angina pectoris ([place])     . History of MI (myocardial infarction) 05/20/2017  . Rectus sheath hematoma 08/08/2014  . Medication management 03/30/2014  . CAD (coronary artery disease) 12/25/2013  . Type 2 diabetes mellitus ([place])   . GERD (gastroesophageal reflux disease)   . Vitamin D deficiency   . Obesity (BMI 30.0-34.9)   . 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 Hypertension 03/28/2012  . Tobacco abuse 03/28/2012  . HLD (hyperlipidemia) 03/28/2012  . COLONIC POLYPS, ADENOMATOUS, HX OF 02/19/2009    Past Surgical History:  Procedure Laterality Date  . BACK SURGERY    . COLONOSCOPY  02/15/2009  . CORONARY ANGIOPLASTY WITH STENT PLACEMENT     about 8 years ago at [facility]  . CORONARY ARTERY BYPASS GRAFT  04/02/2012   Procedure: CORONARY ARTERY BYPASS GRAFTING (CABG);  Surgeon: Rexene Alberts, MD;  Location: [facility];  Service: Open Heart Surgery;  Laterality: N/A;  On pump, times three graphs using endoscopically harvested right greater saphenous vein and left internal mammary artery.   [facility] ACUTE MI REVASCULARIZATION N/A 10/23/2017   Procedure: Coronary/Graft Acute MI Revascularization;  Surgeon: Jettie Booze, MD;  Location: [facility];  Service: Cardiovascular;  Laterality: N/A;  . KNEE SURGERY    . LEFT HEART CATH AND CORS/GRAFTS ANGIOGRAPHY N/A 10/23/2017   Procedure: LEFT HEART CATH AND CORS/GRAFTS ANGIOGRAPHY;  Surgeon: Jettie Booze, MD;  Location: [facility];  Service: Cardiovascular;  Laterality: N/A;  . TONSILLECTOMY          Home Medications    Prior to Admission medications   Medication Sig Start Date End Date Taking? Authorizing Provider  aspirin 81 MG chewable tablet Chew 1 tablet (81 mg total) daily by mouth. 10/27/17   Cheryln Manly, NP  atorvastatin (LIPITOR) 80 MG tablet TAKE 1 TABLET (80 MG TOTAL) DAILY AT 6 PM BY MOUTH. 07/27/18   Unk Pinto, MD  Cyanocobalamin (VITAMIN B-12 PO) Take 1,000 mcg by mouth daily.     [provider]   ezetimibe (ZETIA) 10 MG tablet TAKE 1 TABLET BY MOUTH EVERY DAY 09/05/18   Unk Pinto, MD  FLUoxetine (PROZAC) 40 MG capsule TAKE 1 CAPSULE DAILY FOR MOOD 04/17/18   Unk Pinto, MD  furosemide (LASIX) 20 MG tablet Take 1 tablet (20 mg total) by mouth daily as needed for fluid. 04/02/18 07/01/18  Bhagat, Crista Luria, PA  IRON PO Take 65 mg (of elemental iron) every other day    [provider]  isosorbide mononitrate (IMDUR) 30 MG 24 hr tablet TAKE 1 TABLET (30 MG TOTAL) DAILY BY MOUTH. 04/14/18   Unk Pinto, MD  levothyroxine (SYNTHROID, LEVOTHROID) 100 MCG tablet TAKE 1 TABLET (100 MCG TOTAL) BY MOUTH DAILY BEFORE BREAKFAST. 10/02/18   Unk Pinto, MD  losartan (COZAAR) 100 MG tablet TAKE 1/2 TO 1 TABLET DAILY FOR BLOOD PRESSURE 05/17/18   Unk Pinto, MD  Magnesium 400 MG CAPS Take 400 mg by mouth 5 (five) times daily.     [provider]  metFORMIN (GLUCOPHAGE-XR) 500 MG 24 hr tablet TAKE 1 TABLET WITH BREAKFAST AND LUNCH & 2 TABLETS AT SUPPER FOR DIABETES 04/05/18   Vicie Mutters, PA-C  metoprolol tartrate (LOPRESSOR) 25 MG tablet TAKE 1 TABLET (25 MG TOTAL) 2 (TWO) TIMES DAILY BY MOUTH. 06/09/18   Unk Pinto, MD  nitroGLYCERIN (NITROSTAT) 0.4 MG SL tablet Place 1 tablet (0.4 mg total) every 5 (five) minutes as needed under the tongue. 10/26/17   Cheryln Manly, NP  Omega-3 Fatty Acids (FISH OIL PO) Take 2,000 mg by mouth daily.    [provider]  omeprazole (PRILOSEC) 40 MG capsule Take 1 capsule daily for acid Indigestion & Reflux 12/21/17 07/21/18  Unk Pinto, MD  phentermine (ADIPEX-P) 37.5 MG tablet Take 1/2 to 1 tablet every morning for dieting / weight loss and Mood 03/25/18   Unk Pinto, MD  potassium chloride SA (K-DUR,KLOR-CON) 20 MEQ tablet Take 20 mEq by mouth daily.     [provider]  ticagrelor (BRILINTA) 90 MG TABS tablet Take 1 tablet (90 mg total) 2 (two) times daily by mouth. 10/26/17   Cheryln Manly, NP  Vitamin D, Ergocalciferol, (DRISDOL) 50000 UNITS CAPS capsule Take 1 capsule (50,000 Units total) by mouth 3 (three) times a week. 09/23/15   Rolene Course, PA-C    Family History Family History  Problem Relation Age of Onset  . Hypertension Mother   . Alzheimer's disease Mother   . Colon cancer Father   . Colon cancer Paternal Grandmother   . Esophageal cancer Neg Hx   . Rectal cancer Neg Hx   . Stomach cancer Neg Hx     Social History Social History   Tobacco Use  . Smoking status: Current Every Day Smoker    Packs/day: 2.00    Years: 40.00    Pack years: 80.00    Types: Cigarettes  . Smokeless tobacco: Never Used  .  Tobacco comment: down from 2ppd  Substance Use Topics  . Alcohol use: No    Alcohol/week: 2.0 standard drinks    Types: 1 Glasses of wine, 1 Shots of liquor per week    Comment: Rarely  . Drug use: No     Allergies   Lisinopril   Review of Systems Review of Systems  Constitutional: Positive for fatigue. Negative for chills, diaphoresis and fever.  HENT: Negative for congestion, ear pain and rhinorrhea.   Eyes: Negative for visual disturbance.  Respiratory: Positive for shortness of breath. Negative for cough, hemoptysis, sputum production, chest tightness, wheezing and stridor.   Cardiovascular: Positive for palpitations. Negative for chest pain, leg swelling and syncope.  Gastrointestinal: Negative for abdominal pain, constipation, diarrhea, nausea and vomiting.  Genitourinary: Negative for dysuria.  Musculoskeletal: Negative for back pain, neck pain and neck stiffness.  Skin: Negative for rash.  Neurological: Negative for dizziness, light-headedness and headaches.  Psychiatric/Behavioral: Negative for agitation.  All other systems reviewed and are negative.    Physical Exam Updated Vital Signs Ht 6\' 3"  (1.905 m)   Wt 112 kg   BMI 30.87 kg/m   Physical Exam  Constitutional: He is oriented to person, place, and time. He appears  well-developed and well-nourished.  Non-toxic appearance. He does not appear ill. No distress.  HENT:  Head: Normocephalic and atraumatic.  Nose: Nose normal.  Mouth/Throat: Oropharynx is clear and moist. No oropharyngeal exudate.  Eyes: Pupils are equal, round, and reactive to light. Conjunctivae and EOM are normal.  Neck: Normal range of motion.  Cardiovascular: Normal rate and intact distal pulses.  No murmur heard. Pulmonary/Chest: Effort normal. No stridor. No respiratory distress. He has no decreased breath sounds. He has no wheezes. He has no rhonchi. He has rales in the right lower field and the left lower field. He exhibits no tenderness.  Abdominal: Soft. He exhibits no distension. There is no tenderness.  Musculoskeletal: He exhibits edema (mild). He exhibits no tenderness.  Neurological: He is alert and oriented to person, place, and time. No sensory deficit. He exhibits normal muscle tone.  Skin: Capillary refill takes less than 2 seconds. He is not diaphoretic. No erythema. No pallor.  Nursing note and vitals reviewed.    ED Treatments / Results  Labs (all labs ordered are listed, but only abnormal results are displayed) Labs Reviewed  BASIC METABOLIC PANEL - Abnormal; Notable for the following components:      Result Value   Sodium 134 (*)    Potassium 5.3 (*)    Chloride 96 (*)    Glucose, Bld 113 (*)    Creatinine, Ser 1.38 (*)    GFR calc non Af Amer 53 (*)    All other components within normal limits  CBC - Abnormal; Notable for the following components:   Hemoglobin 12.4 (*)    HCT 38.8 (*)    All other components within normal limits  HEPATIC FUNCTION PANEL - Abnormal; Notable for the following components:   Total Protein 5.6 (*)    Albumin 3.2 (*)    Total Bilirubin 2.3 (*)    Bilirubin, Direct 0.4 (*)    Indirect Bilirubin 1.9 (*)    All other components within normal limits  MAGNESIUM - Abnormal; Notable for the following components:   Magnesium 1.5  (*)    All other components within normal limits  URINALYSIS, ROUTINE W REFLEX MICROSCOPIC - Abnormal; Notable for the following components:   Color, Urine STRAW (*)  All other components within normal limits  BRAIN NATRIURETIC PEPTIDE - Abnormal; Notable for the following components:   B Natriuretic Peptide 978.3 (*)    All other components within normal limits  LIPASE, BLOOD  TSH  BASIC METABOLIC PANEL  CBC  HIV ANTIBODY (ROUTINE TESTING W REFLEX)  I-STAT TROPONIN, ED  I-STAT TROPONIN, ED    EKG EKG Interpretation  Date/Time:  Tuesday October 15 2018 11:29:09 EDT Ventricular Rate:  74 PR Interval:    QRS Duration: 116 QT Interval:  387 QTC Calculation: 430 R Axis:   81 Text Interpretation:  Atrial fibrillation Ventricular premature complex LVH with secondary repolarization abnormality Inferior infarct, age indeterminate When compared to prior, new Afib.  No STEMI Confirmed by Antony Blackbird [phone]) on 10/15/2018 12:03:05 PM   Radiology Dg Chest 2 View  Result Date: 10/15/2018 CLINICAL DATA:  Exertional shortness of breath since being off of Lasix for the past week. Abnormal breath sounds in the lungs. New onset of atrial fibrillation. History of CABG, current smoker, diabetes. EXAM: CHEST - 2 VIEW COMPARISON:  PA and lateral chest x-ray of November 16, 2017 FINDINGS: The lungs are well-expanded. The interstitial markings are coarse. There is no alveolar infiltrate or pleural effusion. The heart is normal in size. The pulmonary vascularity is not engorged. There are post CABG changes. There is faint calcification in the wall of the aortic arch. The bony thorax exhibits no acute abnormality. IMPRESSION: COPD.  No alveolar pneumonia nor pulmonary edema.  Previous CABG. Thoracic aortic atherosclerosis. Electronically Signed   By: David  Martinique M.D.   On: 10/15/2018 13:28    Procedures Procedures (including critical care time)  Medications Ordered in ED Medications  aspirin  chewable tablet 81 mg (has no administration in time range)  atorvastatin (LIPITOR) tablet 80 mg (80 mg Oral Given 10/15/18 2007)  ezetimibe (ZETIA) tablet 10 mg (has no administration in time range)  isosorbide mononitrate (IMDUR) 24 hr tablet 30 mg (has no administration in time range)  losartan (COZAAR) tablet 50 mg (has no administration in time range)  metoprolol tartrate (LOPRESSOR) tablet 25 mg (25 mg Oral Given 10/15/18 2146)  acetaminophen (TYLENOL) tablet 650 mg (has no administration in time range)  ondansetron (ZOFRAN) injection 4 mg (has no administration in time range)  apixaban (ELIQUIS) tablet 5 mg (5 mg Oral Given 10/15/18 2146)  furosemide (LASIX) injection 40 mg (40 mg Intravenous Given 10/15/18 2007)     Initial Impression / Assessment and Plan / ED Course  I have reviewed the triage vital signs and the nursing notes.  Pertinent labs & imaging results that were available during my care of the patient were reviewed by me and considered in my medical decision making (see chart for details).     Curtis Perez is a 67 y.o. male with a past medical history significant for CAD with MI and CABG, CHF, diabetes, hypertension, hyperlipidemia, GERD, hypothyroidism, who presents from his PCP office for fatigue, exertional shortness of breath, nausea, and new atrial fibrillation.  Patient reports that for the last week he has been having extreme fatigue.  He has also had exertional shortness of breath that is much worse than his baseline.  He denies fevers, chills, or cough.  He denies vomiting but does report some mild nausea when short of breath is worsened.  He denies any chest pain, conservation, diarrhea, or urinary symptoms.  He reports he does not feel more edematous than baseline.  He reports that he is never had  atrial fibrillation in his life but has had significant cardiac history with CABG and his heart failure.  She was found to be in atrial fibrillation this morning and was  sent to the ED for evaluation.  He reports that he has not been on his Lasix for the last week after he ran out of it.  On arrival, patient is in atrial fibrillation.  Patient is on room air at rest.  Patient had no murmur appreciated.  Abdomen and chest are nontender.  Legs have mild edema.  Patient alert and oriented resting comfortably.  Given patient's fatigue and shortness of breath worse in the baseline for the last week and his new discovery of A. fib, I suspect he is been in A. fib all week.  Patient is not on anticoagulation.  Patient will screen laboratory testing to look for fluid overload with his exertional shortness of breath as well as chest x-ray.  Chest x-ray shows no pneumonia.  Magnesium is slightly low.  Potassium elevated at 5.3.  Creatinine up at 1.38.  No leukocytosis, mild anemia.  Urine shows no infection.  Cardiology be called for the new A. fib for the last week causing symptoms.  Anticipate admission.  Cards to admit.    Final Clinical Impressions(s) / ED Diagnoses   Final diagnoses:  Atrial fibrillation, unspecified type ([place])    Clinical Impression: 1. Atrial fibrillation, unspecified type [facility])     Disposition: Admit  This note was prepared with assistance of Dragon voice recognition software. Occasional wrong-word or sound-a-like substitutions may have occurred due to the inherent limitations of voice recognition software.      Larisa Lanius, Gwenyth Allegra, MD 10/15/18 2259

## 2018-10-15 NOTE — Progress Notes (Signed)
Subjective:    Patient ID: Curtis Perez, male    DOB: [birth year], 67 y.o.   MRN: [id number]  HPI      67 yo single WM with hx/o HTN, HLD, ASCAD s/p Stent (2005) & CABG (2013). Had a PCA/DES  In Nov 2018 by Dr Acie Fredrickson and was also tx'd for CHF. Apparently he's been off of his Lasix for about a week. Presents now with c/o DOE & at rest and severe fatigue. O2 sat was 85%. EKG showed new Afib.   Medication Sig  . aspirin 81 MG chewable tablet Chew 1 tablet (81 mg total) daily by mouth.  Marland Kitchen atorvastatin (LIPITOR) 80 MG tablet TAKE 1 TABLET (80 MG TOTAL) DAILY AT 6 PM BY MOUTH.  . Cyanocobalamin (VITAMIN B-12 PO) Take 1,000 mcg by mouth daily.   Marland Kitchen ezetimibe (ZETIA) 10 MG tablet TAKE 1 TABLET BY MOUTH EVERY DAY  . FLUoxetine (PROZAC) 40 MG capsule TAKE 1 CAPSULE DAILY FOR MOOD  . IRON PO Take 65 mg (of elemental iron) every other day  . isosorbide mononitrate (IMDUR) 30 MG 24 hr tablet TAKE 1 TABLET (30 MG TOTAL) DAILY BY MOUTH.  Marland Kitchen levothyroxine (SYNTHROID, LEVOTHROID) 100 MCG tablet TAKE 1 TABLET (100 MCG TOTAL) BY MOUTH DAILY BEFORE BREAKFAST.  Marland Kitchen losartan (COZAAR) 100 MG tablet TAKE 1/2 TO 1 TABLET DAILY FOR BLOOD PRESSURE  . Magnesium 400 MG CAPS Take 400 mg by mouth 5 (five) times daily.   . metFORMIN (GLUCOPHAGE-XR) 500 MG 24 hr tablet TAKE 1 TABLET WITH BREAKFAST AND LUNCH & 2 TABLETS AT SUPPER FOR DIABETES  . metoprolol tartrate (LOPRESSOR) 25 MG tablet TAKE 1 TABLET (25 MG TOTAL) 2 (TWO) TIMES DAILY BY MOUTH.  . nitroGLYCERIN (NITROSTAT) 0.4 MG SL tablet Place 1 tablet (0.4 mg total) every 5 (five) minutes as needed under the tongue.  . Omega-3 Fatty Acids (FISH OIL PO) Take 2,000 mg by mouth daily.  . phentermine (ADIPEX-P) 37.5 MG tablet Take 1/2 to 1 tablet every morning for dieting / weight loss and Mood  . potassium chloride SA (K-DUR,KLOR-CON) 20 MEQ tablet Take 20 mEq by mouth daily.   . ticagrelor (BRILINTA) 90 MG TABS tablet Take 1 tablet (90 mg total) 2 (two) times daily by mouth.   . Vitamin D, Ergocalciferol, (DRISDOL) 50000 UNITS CAPS capsule Take 1 capsule (50,000 Units total) by mouth 3 (three) times a week.  . furosemide (LASIX) 20 MG tablet Take 1 tablet (20 mg total) by mouth daily as needed for fluid.  Marland Kitchen omeprazole (PRILOSEC) 40 MG capsule Take 1 capsule daily for acid Indigestion & Reflux    Allergies  Allergen Reactions  . Lisinopril Cough   Past Medical History:  Diagnosis Date  . Acute inferior myocardial infarction ([place]) 10/24/2017  . Adenomatous colon polyp 02/15/2009  . Bradycardia   . CAD (coronary artery disease)    remote MI in 2004 with PCI; s/p CABG x 3 03/2012  . Depression   . Family history of malignant neoplasm of gastrointestinal tract   . GERD (gastroesophageal reflux disease)   . Hyperlipidemia   . Hypertension   . Hypothyroidism   . Myocardial infarction ([place])    2004  . Obesity   . Prediabetes   . 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 Tobacco abuse 03/28/2012  . Vitamin D deficiency    Review of Systems    10  point systems review negative except as above.    Objective:   Physical Exam  BP 136/80   Pulse 68   Temp 97.9 F (36.6 C)   Resp 18   Ht 6\' 3"  (1.905 m)   Wt 246 lb 9.6 oz (111.9 kg)   SpO2 (!) 85% Comment: 85% after walking down hall/ 98% after sitting 10 minutes  BMI 30.82 kg/m   Pale, no cyanosis/icterus/rash. Morbidly obese.   HEENT - WNL. Neck - supple.  Chest - BS decreased. Cor - HS soft with irreg RR w/o sig m. PP 1(+). 1+ edema. MS- FROM w/o deformities.   Neuro -  Nl w/o focal abnormalities.    Assessment & Plan:   1. Atrial fibrillation, new onset ([place])   2. Acute on chronic congestive heart failure, unspecified heart failure type ([place])  - transport to [facility] ER for Eval.

## 2018-10-15 NOTE — Plan of Care (Signed)
Will cont to mon 

## 2018-10-15 NOTE — ED Notes (Signed)
Attempted report, RN in another pt room

## 2018-10-15 NOTE — H&P (Addendum)
Cardiology H&P    Patient ID: JAE SKEET MRN: [id number], DOB/AGE: Nov 18, 1951   Admit date: 10/15/2018 Date of Consult: 10/15/2018  Primary Physician: Unk Pinto, MD Primary Cardiologist: Dr. Acie Fredrickson Requesting Provider: Dr. Sherry Ruffing Reason for Consultation: New onset Afib  Curtis Perez is a 67 y.o. male who is being seen today for the evaluation of new onset afib at the request of Dr. Sherry Ruffing.   Patient Profile    67 yo male with PMH of CAD, HTN, HL, DM, tobacco use, depression, GERD and obesity who presented from PCP office with new onset Afib and shortness of breath.   Past Medical History   Past Medical History:  Diagnosis Date  . Acute inferior myocardial infarction ([place]) 10/24/2017  . Adenomatous colon polyp 02/15/2009  . Bradycardia   . CAD (coronary artery disease)    remote MI in 2004 with PCI; s/p CABG x 3 03/2012  . Depression   . Family history of malignant neoplasm of gastrointestinal tract   . GERD (gastroesophageal reflux disease)   . Hyperlipidemia   . Hypertension   . Hypothyroidism   . Myocardial infarction ([place])    2004  . Obesity   . Prediabetes   . 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 Tobacco abuse 03/28/2012  . Vitamin D deficiency     Past Surgical History:  Procedure Laterality Date  . BACK SURGERY    . COLONOSCOPY  02/15/2009  . CORONARY ANGIOPLASTY WITH STENT PLACEMENT     about 8 years ago at [facility]  . CORONARY ARTERY BYPASS GRAFT  04/02/2012   Procedure: CORONARY ARTERY BYPASS GRAFTING (CABG);  Surgeon: Rexene Alberts, MD;  Location: [place] [facility];  Service: Open Heart Surgery;  Laterality: N/A;  On pump, times three graphs using endoscopically harvested right greater saphenous vein and left internal mammary artery.   [place] ACUTE MI REVASCULARIZATION N/A 10/23/2017   Procedure: Coronary/Graft Acute MI Revascularization;  Surgeon: Jettie Booze, MD;  Location:  [place] [facility];  Service: Cardiovascular;  Laterality: N/A;  . KNEE SURGERY    . LEFT HEART CATH AND CORS/GRAFTS ANGIOGRAPHY N/A 10/23/2017   Procedure: LEFT HEART CATH AND CORS/GRAFTS ANGIOGRAPHY;  Surgeon: Jettie Booze, MD;  Location: [place] [facility];  Service: Cardiovascular;  Laterality: N/A;  . TONSILLECTOMY       Allergies  Allergies  Allergen Reactions  . Lisinopril Cough    History of Present Illness    Mr. [name] is a 67 yo male with PMH of CAD, HTN, HL, DM, tobacco use, depression, GERD and obesity. He underwent CABG back in 2013. Presented with chest pain back in 11/18 chest pain, diaphoresis and weakness.  Was noted to have an elevated troponin on admission with EKG changes.  He was placed on IV heparin and nitro and went for urgent cath.  Catheter revealed 90% stenosis of the circumflex and occlusion of the SVG to OM, and SVG to RCA.  Drug-eluting stent was placed to the proximal circumflex, but distal circumflex was 100% stenosed and not amenable to balloon angioplasty.  Echo at that time noted in EF of 40 to 45% with akinesis of the inferior lateral and inferior myocardium with grade 2 diastolic dysfunction.  He was placed on DAPT with aspirin and Brilinta post cath. He followed up in the office on 11/01/2017 and reported being compliant with his medications.  He was last seen back in February of this year and his diuretic was increased to 20 mg daily.  He continues to smoke cigars, and does have some dietary indiscretion.  He presented to his PCP office today for routine follow-up, and was found to be in new onset atrial fibrillation rate controlled.  He was sent to the ED for further evaluation.  Also reports being out of his Lasix for the past week and has had exertional shortness of breath as a result.  In the ED his labs showed sodium 134, potassium 5.3, creatinine 1.38, POC troponin 0.04, BNP 978.  EKG showed atrial fibrillation with a rate in the 70s.  TSH was  checked and noted at 1.68.  Chest x-ray was negative for edema.  By the time of exam he actually converted to sinus rhythm.   Inpatient Medications      Family History    Family History  Problem Relation Age of Onset  . Hypertension Mother   . Alzheimer's disease Mother   . Colon cancer Father   . Colon cancer Paternal Grandmother   . Esophageal cancer Neg Hx   . Rectal cancer Neg Hx   . Stomach cancer Neg Hx     Social History    Social History   Socioeconomic History  . Marital status: Divorced    Spouse name: Not on file  . Number of children: 0  . Years of education: Not on file  . Highest education level: Not on file  Occupational History  . Occupation: Disability  . Occupation: Counsellor for [employer]  . Financial resource strain: Not very hard  . Food insecurity:    Worry: Never true    Inability: Never true  . Transportation needs:    Medical: No    Non-medical: No  Tobacco Use  . Smoking status: Current Every Day Smoker    Packs/day: 2.00    Years: 40.00    Pack years: 80.00    Types: Cigarettes  . Smokeless tobacco: Never Used  . Tobacco comment: down from 2ppd  Substance and Sexual Activity  . Alcohol use: No    Alcohol/week: 2.0 standard drinks    Types: 1 Glasses of wine, 1 Shots of liquor per week    Comment: Rarely  . Drug use: No  . Sexual activity: Not Currently  Lifestyle  . Physical activity:    Days per week: Patient refused    Minutes per session: Patient refused  . Stress: Patient refused  Relationships  . Social connections:    Talks on phone: Patient refused    Gets together: Patient refused    Attends religious service: Patient refused    Active member of club or organization: Patient refused    Attends meetings of clubs or organizations: Patient refused    Relationship status: Patient refused  . Intimate partner violence:    Fear of current or ex partner: Not on file    Emotionally abused: Not on file      Physically abused: Not on file    Forced sexual activity: Not on file  Other Topics Concern  . Not on file  Social History Narrative  . Not on file     Review of Systems    See HPI  All other systems reviewed and are otherwise negative except as noted above.  Physical Exam    Blood pressure (!) 136/99, pulse 82, resp. rate (!) 22, height 6\' 3"  (1.905 m), weight  112 kg, SpO2 98 %.  General: Pleasant, WM, NAD Psych: Normal affect. Neuro: Alert and oriented X 3. Moves all extremities spontaneously. HEENT: Normal  Neck: Supple without bruits or JVD. Lungs:  Resp regular and unlabored, CTA. Heart: RRR nomurmurs. Abdomen: Soft, non-tender, non-distended, BS + x 4.  Extremities: No clubbing, cyanosis or edema. DP/PT/Radials 2+ and equal bilaterally.  Labs    Troponin [facility] of Care Test) Recent Labs    10/15/18 1234  TROPIPOC 0.04   No results for input(s): CKTOTAL, CKMB, TROPONINI in the last 72 hours. Lab Results  Component Value Date   WBC 9.2 10/15/2018   HGB 12.4 (L) 10/15/2018   HCT 38.8 (L) 10/15/2018   MCV 85.7 10/15/2018   PLT 306 10/15/2018    Recent Labs  Lab 10/15/18 1140 10/15/18 1238  NA 134*  --   K 5.3*  --   CL 96*  --   CO2 25  --   BUN 14  --   CREATININE 1.38*  --   CALCIUM 9.0  --   PROT  --  5.6*  BILITOT  --  2.3*  ALKPHOS  --  98  ALT  --  23  AST  --  23  GLUCOSE 113*  --    Lab Results  Component Value Date   CHOL 190 07/08/2018   HDL 51 07/08/2018   LDLCALC 103 (H) 07/08/2018   TRIG 251 (H) 07/08/2018   No results found for: [facility]   Radiology Studies    Dg Chest 2 View  Result Date: 10/15/2018 CLINICAL DATA:  Exertional shortness of breath since being off of Lasix for the past week. Abnormal breath sounds in the lungs. New onset of atrial fibrillation. History of CABG, current smoker, diabetes. EXAM: CHEST - 2 VIEW COMPARISON:  PA and lateral chest x-ray of November 16, 2017 FINDINGS: The lungs are well-expanded. The  interstitial markings are coarse. There is no alveolar infiltrate or pleural effusion. The heart is normal in size. The pulmonary vascularity is not engorged. There are post CABG changes. There is faint calcification in the wall of the aortic arch. The bony thorax exhibits no acute abnormality. IMPRESSION: COPD.  No alveolar pneumonia nor pulmonary edema.  Previous CABG. Thoracic aortic atherosclerosis. Electronically Signed   By: David  Martinique M.D.   On: 10/15/2018 13:28    ECG & Cardiac Imaging    EKG:  The EKG was personally reviewed and demonstrates Afib--> SR  Echo: 10/25/17  Study Conclusions  - Left ventricle: The cavity size was mildly dilated. There was   mild concentric hypertrophy. Systolic function was mildly to   moderately reduced. The estimated ejection fraction was in the   range of 40% to 45%. Akinesis and scarring of the   entireinferolateral and inferior myocardium; consistent with   infarction in the distribution of the right coronary and left   circumflex coronary artery. There was a reduced contribution of   atrial contraction to ventricular filling, due to increased   ventricular diastolic pressure or atrial contractile dysfunction.   Features are consistent with a pseudonormal left ventricular   filling pattern, with concomitant abnormal relaxation and   increased filling pressure (grade 2 diastolic dysfunction). - Mitral valve: There was moderate to severe regurgitation directed   centrally. - Left atrium: The atrium was severely dilated. - Right atrium: The atrium was mildly dilated. - Pulmonary arteries: PA peak pressure: 37 mm Hg (S).  Assessment & Plan    67 yo  male with PMH of CAD, HTN, HL, DM, tobacco use, depression, GERD and obesity who presented from PCP office with new onset Afib and shortness of breath.   1. New onset Afib: Noted on EKG while at PCP office today. He is unaware of this, and had no palpitations. Actually converted to SR while in the  ED. This patients CHA2DS2-VASc Score of at least 4. He has been on ASA and Brilinta prior to admission.  -- stop Brilinta and start Eliquis 5mg  BID.  -- continue ASA given CAD -- back in SR therefore no need for further rate controlling at this time  2. CAD: No chest pain. Stop Brilinta given need for Brilinta  3. Acute on Chronic systolic HF: Has been out of his lasix for the past week. BNP >900 on admission. Will start IV lasix 40mg  daily now, follow up on response in the am.   4. Tobacco use: cessation advised.   5. Mild AKI: Cr 1.37 on admission. Recheck BMET in the morning.   Barnet Pall, NP-C Pager [phone] 10/15/2018, 3:30 PM   I have personally seen and examined this patient with Reino Bellis, NP-C. I agree with the assessment and plan as outlined above. He is known to have CAD and is s/p CABG with most recent PCI/stenting of the Circumflex in 2018. He continues to smoke. He has been been out of his lasix for a week. He has had progressive dyspnea. No chest pain. He was seen in primary care today and was found to have atrial fibrillation. He is now in sinus with PACs. CHADS VASC score 4.  EKG reviewed by me shows sinus with PACs Labs reviewed.  My exam shows:   General: Well developed, well nourished, NAD  HEENT: OP clear, mucus membranes moist  SKIN: warm, dry. No rashes. Neuro: No focal deficits  Musculoskeletal: Muscle strength 5/5 all ext  Psychiatric: Mood and affect normal  Neck: No JVD, no carotid bruits, no thyromegaly, no lymphadenopathy.  Lungs:Clear bilaterally, no wheezes, rhonci, crackles Cardiovascular: Regular rate and rhythm. No murmurs, gallops or rubs. Abdomen:Soft. Bowel sounds present. Non-tender.  Extremities: No lower extremity edema. Pulses are 2 + in the bilateral DP/PT.  Plan:  Atrial fib: He converted to sinus. Will start Eliqius. Will stop Brilinta. Continue ASA CAD: no chest pain Acute on chronic systolic CHF: LVEF 63-84% by  echo November 2018. Will give one dose of IV Lasix tonight then re-evaluate in the am.   Lauree Chandler 10/15/2018 4:11 PM

## 2018-10-15 NOTE — Addendum Note (Signed)
Addended by: Unk Pinto on: 10/15/2018 07:58 PM   Modules accepted: Orders

## 2018-10-16 DIAGNOSIS — I4891 Unspecified atrial fibrillation: Secondary | ICD-10-CM | POA: Diagnosis not present

## 2018-10-16 DIAGNOSIS — I5023 Acute on chronic systolic (congestive) heart failure: Secondary | ICD-10-CM | POA: Diagnosis not present

## 2018-10-16 LAB — CBC
HCT: 36.2 % — ABNORMAL LOW (ref 39.0–52.0)
Hemoglobin: 11.9 g/dL — ABNORMAL LOW (ref 13.0–17.0)
MCH: 27.8 pg (ref 26.0–34.0)
MCHC: 32.9 g/dL (ref 30.0–36.0)
MCV: 84.6 fL (ref 80.0–100.0)
Platelets: 242 K/uL (ref 150–400)
RBC: 4.28 MIL/uL (ref 4.22–5.81)
RDW: 14.1 % (ref 11.5–15.5)
WBC: 6.8 K/uL (ref 4.0–10.5)
nRBC: 0 % (ref 0.0–0.2)

## 2018-10-16 LAB — BASIC METABOLIC PANEL WITH GFR
Anion gap: 9 (ref 5–15)
BUN: 15 mg/dL (ref 8–23)
CO2: 32 mmol/L (ref 22–32)
Calcium: 8.9 mg/dL (ref 8.9–10.3)
Chloride: 95 mmol/L — ABNORMAL LOW (ref 98–111)
Creatinine, Ser: 1.28 mg/dL — ABNORMAL HIGH (ref 0.61–1.24)
GFR calc Af Amer: >60 mL/min (ref >60)
GFR calc non Af Amer: 58 mL/min — ABNORMAL LOW (ref >60)
Glucose, Bld: 116 mg/dL — ABNORMAL HIGH (ref 70–99)
Potassium: 3.4 mmol/L — ABNORMAL LOW (ref 3.5–5.1)
Sodium: 136 mmol/L (ref 135–145)

## 2018-10-16 LAB — HIV ANTIBODY (ROUTINE TESTING W REFLEX): HIV Screen 4th Generation wRfx: NONREACTIVE

## 2018-10-16 MED ORDER — POTASSIUM CHLORIDE CRYS ER 20 MEQ PO TBCR
40.0000 meq | EXTENDED_RELEASE_TABLET | Freq: Once | ORAL | Status: AC
  Administered 2018-10-16: 40 meq via ORAL
  Filled 2018-10-16: qty 2

## 2018-10-16 MED ORDER — APIXABAN 5 MG PO TABS: 5.0000 mg | ORAL_TABLET | Freq: Two times a day (BID) | ORAL | 2 refills | Status: DC

## 2018-10-16 MED ORDER — FUROSEMIDE 10 MG/ML IJ SOLN
40.0000 mg | Freq: Once | INTRAMUSCULAR | Status: AC
  Administered 2018-10-16: 40 mg via INTRAVENOUS
  Filled 2018-10-16: qty 4

## 2018-10-16 MED FILL — ELIQUIS 5 MG TABLET: 5 | 30 days supply | Qty: 60 | Fill #0 | Status: TO

## 2018-10-16 NOTE — Progress Notes (Addendum)
Progress Note  Patient Name: Curtis Perez Date of Encounter: 10/16/2018  Primary Cardiologist: Nahser  Subjective   He feels better today. Dyspnea improved. No chest pain.   Inpatient Medications    Scheduled Meds: . apixaban  5 mg Oral BID  . aspirin  81 mg Oral Daily  . atorvastatin  80 mg Oral Daily  . ezetimibe  10 mg Oral Daily  . furosemide  40 mg Intravenous Daily  . isosorbide mononitrate  30 mg Oral Daily  . losartan  50 mg Oral Daily  . metoprolol tartrate  25 mg Oral BID   Continuous Infusions:  PRN Meds: acetaminophen, ondansetron (ZOFRAN) IV   Vital Signs    Vitals:   10/15/18 1715 10/15/18 1824 10/15/18 2254 10/16/18 0652  BP: (!) 143/78 (!) 144/88 (!) 118/91 140/87  Pulse: 69 73 69 65  Resp: (!) 21  16 16   Temp:   98.8 F (37.1 C) 98.1 F (36.7 C)  TempSrc:   Oral Oral  SpO2: 99% 100% 98% 96%  Weight:      Height:       No intake or output data in the 24 hours ending 10/16/18 0802 Filed Weights   10/15/18 1128  Weight: 112 kg    Telemetry    Sinus - Personally Reviewed  ECG    No am ekg - Personally Reviewed  Physical Exam   GEN: No acute distress.   Neck: No JVD Cardiac: RRR, no murmurs, rubs, or gallops.  Respiratory: Clear to auscultation bilaterally. GI: Soft, nontender, non-distended  MS: No edema; No deformity. Neuro:  Nonfocal  Psych: Normal affect   Labs    Chemistry Recent Labs  Lab 10/15/18 1140 10/15/18 1238 10/16/18 0449  NA 134*  --  136  K 5.3*  --  3.4*  CL 96*  --  95*  CO2 25  --  32  GLUCOSE 113*  --  116*  BUN 14  --  15  CREATININE 1.38*  --  1.28*  CALCIUM 9.0  --  8.9  PROT  --  5.6*  --   ALBUMIN  --  3.2*  --   AST  --  23  --   ALT  --  23  --   ALKPHOS  --  98  --   BILITOT  --  2.3*  --   GFRNONAA 53*  --  58*  GFRAA >60  --  >60  ANIONGAP 13  --  9     Hematology Recent Labs  Lab 10/15/18 1140 10/16/18 0449  WBC 9.2 6.8  RBC 4.53 4.28  HGB 12.4* 11.9*  HCT 38.8* 36.2*    MCV 85.7 84.6  MCH 27.4 27.8  MCHC 32.0 32.9  RDW 14.1 14.1  PLT 306 242    Cardiac EnzymesNo results for input(s): TROPONINI in the last 168 hours.  Recent Labs  Lab 10/15/18 1234 10/15/18 1601  TROPIPOC 0.04 0.05     BNP Recent Labs  Lab 10/15/18 1348  BNP 978.3*     DDimer No results for input(s): DDIMER in the last 168 hours.   Radiology    Dg Chest 2 View  Result Date: 10/15/2018 CLINICAL DATA:  Exertional shortness of breath since being off of Lasix for the past week. Abnormal breath sounds in the lungs. New onset of atrial fibrillation. History of CABG, current smoker, diabetes. EXAM: CHEST - 2 VIEW COMPARISON:  PA and lateral chest x-ray of November 16, 2017 FINDINGS:  The lungs are well-expanded. The interstitial markings are coarse. There is no alveolar infiltrate or pleural effusion. The heart is normal in size. The pulmonary vascularity is not engorged. There are post CABG changes. There is faint calcification in the wall of the aortic arch. The bony thorax exhibits no acute abnormality. IMPRESSION: COPD.  No alveolar pneumonia nor pulmonary edema.  Previous CABG. Thoracic aortic atherosclerosis. Electronically Signed   By: David  Martinique M.D.   On: 10/15/2018 13:28    Cardiac Studies     Patient Profile     67 y.o. male with PMH of CAD, HTN, HLD, DM, tobacco use, depression, GERD and obesity who presented from PCP office with new onset Afib and shortness of breath. He converted to sinus in the ED. Mild volume overload on exam.   Assessment & Plan    1. Atrial fibrillation, new onset: Sinus this am. Will continue beta blocker and Eliquis. CHADS VASC score 4. Of note, we stopped his Brilinta as he is now almost 12 months out from his coronary stent.   2. CAD: No chest pain. We have stopped Brlilinta. Will continue ASA along with his Eliquis now. Continue beta blocker and statin.   3. Acute on chronic systolic CHF: I/O not documented. He says that he urinated  frequently last night. I think his volume status is better. Will given one dose IV Lasix this am. Then discharge home later this am. Resume Lasix 20 mg daily at home tomorrow.   4. Acute kidney injury: Resolving.   5. Hypokalemia: Will replace potassium this am. I would start KDur 10 meq per day at home.   6. Tobacco abuse: tobacco cessation is recommended.   Plan discharge later this morning.   For questions or updates, please contact [place] Please consult [email or web address] for contact info under        Signed, Lauree Chandler, MD  10/16/2018, 8:02 AM

## 2018-10-16 NOTE — Discharge Summary (Signed)
Discharge Summary    Patient ID: Curtis Perez,  MRN: [id number], DOB/AGE: [birth year] 65 y.o.  Admit date: 10/15/2018 Discharge date: 10/16/2018  Primary Care Provider: Unk Pinto Primary Cardiologist: Dr. Acie Fredrickson   Discharge Diagnoses    Active Problems:   Atrial fibrillation [facility])   Hypertension   HLD (hyperlipidemia)   Coronary artery disease involving coronary bypass graft of native heart with unstable angina pectoris (HCC)  Allergies Allergies  Allergen Reactions  . Lisinopril Cough    Diagnostic Studies/Procedures    N/a  _____________   History of Present Illness     Curtis Perez is a 67 yo male with PMH of CAD, HTN, HL, DM, tobacco use, depression, GERD and obesity. He underwent CABG back in 2013. Presented with chest pain back in 11/18 chest pain, diaphoresis and weakness.  Was noted to have an elevated troponin on admission with EKG changes.  He was placed on IV heparin and nitro and went for urgent cath.  Catheterization revealed 90% stenosis of the circumflex and occlusion of the SVG to OM, and SVG to RCA.  Drug-eluting stent was placed to the proximal circumflex, but distal circumflex was 100% stenosed and not amenable to balloon angioplasty.  Echo at that time noted in EF of 40 to 45% with akinesis of the inferior lateral and inferior myocardium with grade 2 diastolic dysfunction.  He was placed on DAPT with aspirin and Brilinta post cath. He followed up in the office on 11/01/2017 and reported being compliant with his medications.  He was last seen back in February of this year and his diuretic was increased to 20 mg daily.  He continued to smoke cigars, and does have some dietary indiscretion.  He presented to his PCP office the day of admission for routine follow-up, and was found to be in new onset atrial fibrillation rate controlled.  He was sent to the ED for further evaluation.  Also reported being out of his Lasix for the past week and had exertional  shortness of breath as a result.  In the ED his labs showed sodium 134, potassium 5.3, creatinine 1.38, POC troponin 0.04, BNP 978.  EKG showed atrial fibrillation with a rate in the 70s.  TSH was checked and noted at 1.68.  Chest x-ray was negative for edema.  By the time of exam he actually converted to sinus rhythm.   Hospital Course     He was admitted and diuresed with IV lasix, with reported improvement in his breathing. No I&O was documented on admission, nor weights. He remained in SR, and was placed on Eliquis 5mg  BID on admission. Given he was a year out from stent placement, his Curtis Perez was stopped with the need for Eliquis. ASA was continued. He was placed back on lasix 20mg  daily to restart the day following discharge. Continue on K+ supplement as well. Discussed the need for tobacco cessation.    Curtis Perez was seen by Dr. Angelena Form and determined stable for discharge home. Follow up in the office has been arranged. Medications are listed below.   _____________  Discharge Vitals Blood pressure 140/87, pulse 65, temperature 98.1 F (36.7 C), temperature source Oral, resp. rate 16, height 6\' 3"  (1.905 m), weight 112 kg, SpO2 96 %.  Filed Weights   10/15/18 1128  Weight: 112 kg    Labs & Radiologic Studies    CBC Recent Labs    10/15/18 1140 10/16/18 0449  WBC 9.2 6.8  HGB 12.4*  11.9*  HCT 38.8* 36.2*  MCV 85.7 84.6  PLT 306 128   Basic Metabolic Panel Recent Labs    10/15/18 1140 10/15/18 1238 10/16/18 0449  NA 134*  --  136  K 5.3*  --  3.4*  CL 96*  --  95*  CO2 25  --  32  GLUCOSE 113*  --  116*  BUN 14  --  15  CREATININE 1.38*  --  1.28*  CALCIUM 9.0  --  8.9  MG  --  1.5*  --    Liver Function Tests Recent Labs    10/15/18 1238  AST 23  ALT 23  ALKPHOS 98  BILITOT 2.3*  PROT 5.6*  ALBUMIN 3.2*   Recent Labs    10/15/18 1238  LIPASE 19   Cardiac Enzymes No results for input(s): CKTOTAL, CKMB, CKMBINDEX, TROPONINI in the last 72  hours. BNP Invalid input(s): POCBNP D-Dimer No results for input(s): DDIMER in the last 72 hours. Hemoglobin A1C No results for input(s): HGBA1C in the last 72 hours. Fasting Lipid Panel No results for input(s): CHOL, HDL, LDLCALC, TRIG, CHOLHDL, LDLDIRECT in the last 72 hours. Thyroid Function Tests Recent Labs    10/15/18 1348  TSH 1.688   _____________  Dg Chest 2 View  Result Date: 10/15/2018 CLINICAL DATA:  Exertional shortness of breath since being off of Lasix for the past week. Abnormal breath sounds in the lungs. New onset of atrial fibrillation. History of CABG, current smoker, diabetes. EXAM: CHEST - 2 VIEW COMPARISON:  PA and lateral chest x-ray of November 16, 2017 FINDINGS: The lungs are well-expanded. The interstitial markings are coarse. There is no alveolar infiltrate or pleural effusion. The heart is normal in size. The pulmonary vascularity is not engorged. There are post CABG changes. There is faint calcification in the wall of the aortic arch. The bony thorax exhibits no acute abnormality. IMPRESSION: COPD.  No alveolar pneumonia nor pulmonary edema.  Previous CABG. Thoracic aortic atherosclerosis. Electronically Signed   By: David  Martinique M.D.   On: 10/15/2018 13:28   Disposition   Pt is being discharged home today in good condition.  Follow-up Plans & Appointments    Follow-up Information    [place], Crista [place], [place] Follow up on 11/05/2018.   Specialty:  Cardiology Why:  at 10am for your follow up app.  Contact information: [address] [place] [address] [phone]          Discharge Instructions    (HEART FAILURE PATIENTS) Call MD:  Anytime you have any of the following symptoms: 1) 3 pound weight gain in 24 hours or 5 pounds in 1 week 2) shortness of breath, with or without a dry hacking cough 3) swelling in the hands, feet or stomach 4) if you have to sleep on extra pillows at night in order to breathe.   Complete by:  As directed     Diet - low sodium heart healthy   Complete by:  As directed    Increase activity slowly   Complete by:  As directed      Discharge Medications     Medication List    STOP taking these medications   phentermine 37.5 MG tablet Commonly known as:  ADIPEX-P   ticagrelor 90 MG Tabs tablet Commonly known as:  BRILINTA     TAKE these medications   apixaban 5 MG Tabs tablet Commonly known as:  ELIQUIS Take 1 tablet (5 mg total) by mouth 2 (  two) times daily.   aspirin 81 MG chewable tablet Chew 1 tablet (81 mg total) daily by mouth.   atorvastatin 80 MG tablet Commonly known as:  LIPITOR TAKE 1 TABLET (80 MG TOTAL) DAILY AT 6 PM BY MOUTH. What changed:  when to take this   ezetimibe 10 MG tablet Commonly known as:  ZETIA TAKE 1 TABLET BY MOUTH EVERY DAY   FISH OIL PO Take 2,000 mg by mouth daily.   FLUoxetine 40 MG capsule Commonly known as:  PROZAC TAKE 1 CAPSULE DAILY FOR MOOD What changed:  See the new instructions.   furosemide 20 MG tablet Commonly known as:  LASIX Take 1 tablet (20 mg total) by mouth daily as needed for fluid.   IRON PO Take 65 mg (of elemental iron) every other day   isosorbide mononitrate 30 MG 24 hr tablet Commonly known as:  IMDUR TAKE 1 TABLET (30 MG TOTAL) DAILY BY MOUTH.   levothyroxine 100 MCG tablet Commonly known as:  SYNTHROID, LEVOTHROID TAKE 1 TABLET (100 MCG TOTAL) BY MOUTH DAILY BEFORE BREAKFAST. What changed:  See the new instructions.   losartan 100 MG tablet Commonly known as:  COZAAR TAKE 1/2 TO 1 TABLET DAILY FOR BLOOD PRESSURE What changed:  See the new instructions.   Magnesium 400 MG Caps Take 400 mg by mouth 5 (five) times daily.   metFORMIN 500 MG 24 hr tablet Commonly known as:  GLUCOPHAGE-XR TAKE 1 TABLET WITH BREAKFAST AND LUNCH & 2 TABLETS AT SUPPER FOR DIABETES What changed:  See the new instructions.   metoprolol tartrate 25 MG tablet Commonly known as:  LOPRESSOR TAKE 1 TABLET (25 MG TOTAL) 2  (TWO) TIMES DAILY BY MOUTH.   nitroGLYCERIN 0.4 MG SL tablet Commonly known as:  NITROSTAT Place 1 tablet (0.4 mg total) every 5 (five) minutes as needed under the tongue.   omeprazole 40 MG capsule Commonly known as:  PRILOSEC Take 1 capsule daily for acid Indigestion & Reflux What changed:    how much to take  how to take this  when to take this  additional instructions   potassium chloride SA 20 MEQ tablet Commonly known as:  K-DUR,KLOR-CON Take 20 mEq by mouth daily.   VITAMIN B-12 PO Take 1,000 mcg by mouth daily.   Vitamin D (Ergocalciferol) 50000 units Caps capsule Commonly known as:  DRISDOL Take 1 capsule (50,000 Units total) by mouth 3 (three) times a week. What changed:  when to take this        Acute coronary syndrome (MI, NSTEMI, STEMI, etc) this admission?: No.   Outstanding Labs/Studies   N/a  Duration of Discharge Encounter   Greater than 30 minutes including physician time.  Signed, Reino Bellis NP-C 10/16/2018, 10:52 AM

## 2018-10-16 NOTE — Care Management Note (Signed)
Case Management Note  Patient Details  Name: OBRIEN HUSKINS MRN: [id number] Date of Birth: 01-23-1951  Subjective/Objective:  67yo male presented with new onset Afib. PMH: CAD, HTN, HL, DM, tobacco use, depression and GERD                  Action/Plan: CM met with patient to discuss transitional needs. Patient lives at home, independent with ADLs PTA. Benefits check for Eliquis completed with est copay: Eliquis is covered 30 day supply for 62m 2xday at pharmacy is $300.00 if mail ordered 90 day supply is $300.00 no prior auth required. CM discussed with patient who verbalized understanding; Eliquis copay card provided. Patient's Eliquis was filled by [facility] delivered to room. No further needs from CM.   Expected Discharge Date:  10/16/18               Expected Discharge Plan:  Home/Self Care  In-House Referral:  NA  Discharge planning Services  CM Consult, Medication Assistance(Eliquis copay card)  Post Acute Care Choice:  NA Choice offered to:  NA  DME Arranged:  N/A DME Agency:  NA  HH Arranged:  NA HH Agency:  NA  Status of Service:  Completed, signed off  If discussed at [facility] Stay Meetings, dates discussed:    Additional Comments:  NMidge MiniumRN, BSN, NCM-BC, ACM-RN [phone] [id number]/30/2019, 4:06 PM

## 2018-10-16 NOTE — Progress Notes (Signed)
Patient alert and oriented in NAD. Patient verbalized understanding of discharge instructions.

## 2018-10-18 ENCOUNTER — Telehealth: Payer: Self-pay | Admitting: *Deleted

## 2018-10-18 NOTE — Telephone Encounter (Signed)
Called patient on 10/18/2018 , 9:05 AM in an attempt to reach the patient for a hospital follow up.  Admit date: 10/15/18 Discharge date: 10/16/18.

## 2018-10-23 ENCOUNTER — Telehealth: Payer: Self-pay | Admitting: *Deleted

## 2018-10-23 NOTE — Telephone Encounter (Signed)
Called patient on 10/23/2018 , 3:53 PM in an attempt to reach the patient for a hospital follow up.  Admit date: 10/15/18 Discharge date: 10/16/18.  We have tried several times to reach patient

## 2018-10-25 ENCOUNTER — Other Ambulatory Visit: Payer: Self-pay | Admitting: Internal Medicine

## 2018-10-25 DIAGNOSIS — K219 Gastro-esophageal reflux disease without esophagitis: Secondary | ICD-10-CM

## 2018-10-30 ENCOUNTER — Inpatient Hospital Stay (HOSPITAL_COMMUNITY)
Admission: EM | Admit: 2018-10-30 | Discharge: 2018-11-01 | DRG: 291 | Disposition: A | Discharge status: Home / Self Care | Payer: 59 | Attending: Internal Medicine | Admitting: Internal Medicine

## 2018-10-30 ENCOUNTER — Emergency Department (HOSPITAL_COMMUNITY): Payer: 59

## 2018-10-30 ENCOUNTER — Encounter (HOSPITAL_COMMUNITY): Payer: Self-pay | Admitting: Emergency Medicine

## 2018-10-30 DIAGNOSIS — R0609 Other forms of dyspnea: Secondary | ICD-10-CM | POA: Diagnosis present

## 2018-10-30 DIAGNOSIS — J449 Chronic obstructive pulmonary disease, unspecified: Secondary | ICD-10-CM | POA: Diagnosis present

## 2018-10-30 DIAGNOSIS — Z820 Family history of epilepsy and other diseases of the nervous system: Secondary | ICD-10-CM

## 2018-10-30 DIAGNOSIS — Z6829 Body mass index (BMI) 29.0-29.9, adult: Secondary | ICD-10-CM

## 2018-10-30 DIAGNOSIS — I110 Hypertensive heart disease with heart failure: Principal | ICD-10-CM | POA: Diagnosis present

## 2018-10-30 DIAGNOSIS — Z800 Family history of malignant neoplasm of digestive organs: Secondary | ICD-10-CM

## 2018-10-30 DIAGNOSIS — Z86010 Personal history of colonic polyps: Secondary | ICD-10-CM

## 2018-10-30 DIAGNOSIS — I4891 Unspecified atrial fibrillation: Secondary | ICD-10-CM | POA: Diagnosis present

## 2018-10-30 DIAGNOSIS — Z9119 Patient's noncompliance with other medical treatment and regimen: Secondary | ICD-10-CM

## 2018-10-30 DIAGNOSIS — Z955 Presence of coronary angioplasty implant and graft: Secondary | ICD-10-CM

## 2018-10-30 DIAGNOSIS — E1122 Type 2 diabetes mellitus with diabetic chronic kidney disease: Secondary | ICD-10-CM

## 2018-10-30 DIAGNOSIS — R0602 Shortness of breath: Secondary | ICD-10-CM | POA: Diagnosis not present

## 2018-10-30 DIAGNOSIS — E039 Hypothyroidism, unspecified: Secondary | ICD-10-CM | POA: Diagnosis present

## 2018-10-30 DIAGNOSIS — Z8249 Family history of ischemic heart disease and other diseases of the circulatory system: Secondary | ICD-10-CM

## 2018-10-30 DIAGNOSIS — I25118 Atherosclerotic heart disease of native coronary artery with other forms of angina pectoris: Secondary | ICD-10-CM | POA: Diagnosis present

## 2018-10-30 DIAGNOSIS — F329 Major depressive disorder, single episode, unspecified: Secondary | ICD-10-CM | POA: Diagnosis present

## 2018-10-30 DIAGNOSIS — Z79890 Hormone replacement therapy (postmenopausal): Secondary | ICD-10-CM

## 2018-10-30 DIAGNOSIS — Z7982 Long term (current) use of aspirin: Secondary | ICD-10-CM

## 2018-10-30 DIAGNOSIS — Z9089 Acquired absence of other organs: Secondary | ICD-10-CM

## 2018-10-30 DIAGNOSIS — N182 Chronic kidney disease, stage 2 (mild): Secondary | ICD-10-CM

## 2018-10-30 DIAGNOSIS — E119 Type 2 diabetes mellitus without complications: Secondary | ICD-10-CM | POA: Diagnosis present

## 2018-10-30 DIAGNOSIS — R740 Nonspecific elevation of levels of transaminase and lactic acid dehydrogenase [LDH]: Secondary | ICD-10-CM

## 2018-10-30 DIAGNOSIS — I4819 Other persistent atrial fibrillation: Secondary | ICD-10-CM | POA: Diagnosis present

## 2018-10-30 DIAGNOSIS — I491 Atrial premature depolarization: Secondary | ICD-10-CM | POA: Diagnosis not present

## 2018-10-30 DIAGNOSIS — Z7901 Long term (current) use of anticoagulants: Secondary | ICD-10-CM

## 2018-10-30 DIAGNOSIS — Z9114 Patient's other noncompliance with medication regimen: Secondary | ICD-10-CM

## 2018-10-30 DIAGNOSIS — N179 Acute kidney failure, unspecified: Secondary | ICD-10-CM | POA: Diagnosis present

## 2018-10-30 DIAGNOSIS — Z79899 Other long term (current) drug therapy: Secondary | ICD-10-CM

## 2018-10-30 DIAGNOSIS — I252 Old myocardial infarction: Secondary | ICD-10-CM

## 2018-10-30 DIAGNOSIS — Z794 Long term (current) use of insulin: Secondary | ICD-10-CM

## 2018-10-30 DIAGNOSIS — I5023 Acute on chronic systolic (congestive) heart failure: Secondary | ICD-10-CM | POA: Diagnosis present

## 2018-10-30 DIAGNOSIS — I499 Cardiac arrhythmia, unspecified: Secondary | ICD-10-CM | POA: Diagnosis not present

## 2018-10-30 DIAGNOSIS — J9601 Acute respiratory failure with hypoxia: Secondary | ICD-10-CM | POA: Diagnosis present

## 2018-10-30 DIAGNOSIS — E785 Hyperlipidemia, unspecified: Secondary | ICD-10-CM | POA: Diagnosis present

## 2018-10-30 DIAGNOSIS — Z720 Tobacco use: Secondary | ICD-10-CM | POA: Diagnosis present

## 2018-10-30 DIAGNOSIS — I5043 Acute on chronic combined systolic (congestive) and diastolic (congestive) heart failure: Secondary | ICD-10-CM | POA: Diagnosis present

## 2018-10-30 DIAGNOSIS — R0600 Dyspnea, unspecified: Secondary | ICD-10-CM | POA: Diagnosis present

## 2018-10-30 DIAGNOSIS — K761 Chronic passive congestion of liver: Secondary | ICD-10-CM | POA: Diagnosis present

## 2018-10-30 DIAGNOSIS — I10 Essential (primary) hypertension: Secondary | ICD-10-CM | POA: Diagnosis present

## 2018-10-30 DIAGNOSIS — Z66 Do not resuscitate: Secondary | ICD-10-CM | POA: Diagnosis not present

## 2018-10-30 DIAGNOSIS — F17200 Nicotine dependence, unspecified, uncomplicated: Secondary | ICD-10-CM | POA: Diagnosis present

## 2018-10-30 DIAGNOSIS — R7401 Elevation of levels of liver transaminase levels: Secondary | ICD-10-CM | POA: Diagnosis present

## 2018-10-30 DIAGNOSIS — K219 Gastro-esophageal reflux disease without esophagitis: Secondary | ICD-10-CM | POA: Diagnosis present

## 2018-10-30 DIAGNOSIS — I25700 Atherosclerosis of coronary artery bypass graft(s), unspecified, with unstable angina pectoris: Secondary | ICD-10-CM | POA: Diagnosis present

## 2018-10-30 HISTORY — DX: Chronic obstructive pulmonary disease, unspecified: J44.9

## 2018-10-30 LAB — COMPREHENSIVE METABOLIC PANEL WITH GFR
ALT: 501 U/L — ABNORMAL HIGH (ref 0–44)
AST: 600 U/L — ABNORMAL HIGH (ref 15–41)
Albumin: 3.1 g/dL — ABNORMAL LOW (ref 3.5–5.0)
Alkaline Phosphatase: 106 U/L (ref 38–126)
Anion gap: 13 (ref 5–15)
BUN: 28 mg/dL — ABNORMAL HIGH (ref 8–23)
CO2: 21 mmol/L — ABNORMAL LOW (ref 22–32)
Calcium: 8.7 mg/dL — ABNORMAL LOW (ref 8.9–10.3)
Chloride: 101 mmol/L (ref 98–111)
Creatinine, Ser: 1.55 mg/dL — ABNORMAL HIGH (ref 0.61–1.24)
GFR calc Af Amer: 53 mL/min — ABNORMAL LOW
GFR calc non Af Amer: 46 mL/min — ABNORMAL LOW
Glucose, Bld: 139 mg/dL — ABNORMAL HIGH (ref 70–99)
Potassium: 4.2 mmol/L (ref 3.5–5.1)
Sodium: 135 mmol/L (ref 135–145)
Total Bilirubin: 1.9 mg/dL — ABNORMAL HIGH (ref 0.3–1.2)
Total Protein: 5.6 g/dL — ABNORMAL LOW (ref 6.5–8.1)

## 2018-10-30 LAB — CBC
HCT: 38.2 % — ABNORMAL LOW (ref 39.0–52.0)
Hemoglobin: 12.2 g/dL — ABNORMAL LOW (ref 13.0–17.0)
MCH: 27.6 pg (ref 26.0–34.0)
MCHC: 31.9 g/dL (ref 30.0–36.0)
MCV: 86.4 fL (ref 80.0–100.0)
Platelets: 230 K/uL (ref 150–400)
RBC: 4.42 MIL/uL (ref 4.22–5.81)
RDW: 14.2 % (ref 11.5–15.5)
WBC: 6.6 K/uL (ref 4.0–10.5)
nRBC: 0.3 % — ABNORMAL HIGH (ref 0.0–0.2)

## 2018-10-30 LAB — I-STAT CG4 LACTIC ACID, ED
Lactic Acid, Venous: 3.21 mmol/L (ref 0.5–1.9)
Lactic Acid, Venous: 2.52 mmol/L (ref 0.5–1.9)

## 2018-10-30 LAB — GLUCOSE, CAPILLARY: Glucose-Capillary: 196 mg/dL — ABNORMAL HIGH (ref 70–99)

## 2018-10-30 LAB — TSH: TSH: 2.797 u[IU]/mL (ref 0.350–4.500)

## 2018-10-30 LAB — I-STAT TROPONIN, ED: Troponin i, poc: 0.04 ng/mL (ref 0.00–0.08)

## 2018-10-30 LAB — TROPONIN I: Troponin I: 0.04 ng/mL (ref <0.03)

## 2018-10-30 MED ORDER — ONDANSETRON HCL 4 MG/2ML IJ SOLN: 4.0000 mg | Freq: Four times a day (QID) | INTRAMUSCULAR | Status: DC | PRN

## 2018-10-30 MED ORDER — ONDANSETRON HCL 4 MG PO TABS: 4.0000 mg | ORAL_TABLET | Freq: Four times a day (QID) | ORAL | Status: DC | PRN

## 2018-10-30 MED ORDER — SODIUM CHLORIDE 0.9 % IV BOLUS
500.0000 mL | Freq: Once | INTRAVENOUS | Status: AC
  Administered 2018-10-30: 500 mL via INTRAVENOUS

## 2018-10-30 MED ORDER — INSULIN ASPART 100 UNIT/ML ~~LOC~~ SOLN
0.0000 [IU] | Freq: Three times a day (TID) | SUBCUTANEOUS | Status: DC
  Administered 2018-10-31: 2 [IU] via SUBCUTANEOUS
  Administered 2018-11-01: 1 [IU] via SUBCUTANEOUS

## 2018-10-30 MED ORDER — OMEGA-3-ACID ETHYL ESTERS 1 G PO CAPS
1.0000 g | ORAL_CAPSULE | Freq: Two times a day (BID) | ORAL | Status: DC
  Administered 2018-10-30 – 2018-11-01 (×4): 1 g via ORAL
  Filled 2018-10-30 (×4): qty 1

## 2018-10-30 MED ORDER — ASPIRIN 81 MG PO CHEW
81.0000 mg | CHEWABLE_TABLET | Freq: Every day | ORAL | Status: DC
  Administered 2018-10-31 – 2018-11-01 (×2): 81 mg via ORAL
  Filled 2018-10-30 (×2): qty 1

## 2018-10-30 MED ORDER — METFORMIN HCL ER 500 MG PO TB24: 500.0000 mg | ORAL_TABLET | ORAL | Status: DC

## 2018-10-30 MED ORDER — ATORVASTATIN CALCIUM 80 MG PO TABS
80.0000 mg | ORAL_TABLET | Freq: Every day | ORAL | Status: DC
  Administered 2018-10-31: 80 mg via ORAL
  Filled 2018-10-30: qty 1

## 2018-10-30 MED ORDER — METFORMIN HCL ER 500 MG PO TB24
1000.0000 mg | ORAL_TABLET | Freq: Every day | ORAL | Status: DC
  Administered 2018-10-31: 1000 mg via ORAL
  Filled 2018-10-30 (×2): qty 2

## 2018-10-30 MED ORDER — FUROSEMIDE 10 MG/ML IJ SOLN
20.0000 mg | Freq: Two times a day (BID) | INTRAMUSCULAR | Status: DC
  Administered 2018-10-31: 20 mg via INTRAVENOUS
  Filled 2018-10-30: qty 2

## 2018-10-30 MED ORDER — VITAMIN B-12 1000 MCG PO TABS
1000.0000 ug | ORAL_TABLET | Freq: Every day | ORAL | Status: DC
  Administered 2018-10-30 – 2018-11-01 (×3): 1000 ug via ORAL
  Filled 2018-10-30 (×3): qty 1

## 2018-10-30 MED ORDER — APIXABAN 5 MG PO TABS
5.0000 mg | ORAL_TABLET | Freq: Two times a day (BID) | ORAL | Status: DC
  Administered 2018-10-30 – 2018-11-01 (×4): 5 mg via ORAL
  Filled 2018-10-30 (×4): qty 1

## 2018-10-30 MED ORDER — SODIUM CHLORIDE 0.9% FLUSH: 3.0000 mL | INTRAVENOUS | Status: DC | PRN

## 2018-10-30 MED ORDER — VITAMIN D (ERGOCALCIFEROL) 1.25 MG (50000 UNIT) PO CAPS
50000.0000 [IU] | ORAL_CAPSULE | ORAL | Status: DC
  Administered 2018-10-30: 50000 [IU] via ORAL
  Filled 2018-10-30: qty 1

## 2018-10-30 MED ORDER — SODIUM CHLORIDE 0.9% FLUSH
3.0000 mL | Freq: Two times a day (BID) | INTRAVENOUS | Status: DC
  Administered 2018-10-30 – 2018-11-01 (×4): 3 mL via INTRAVENOUS

## 2018-10-30 MED ORDER — PANTOPRAZOLE SODIUM 40 MG PO TBEC
40.0000 mg | DELAYED_RELEASE_TABLET | Freq: Every day | ORAL | Status: DC
  Administered 2018-10-31 – 2018-11-01 (×2): 40 mg via ORAL
  Filled 2018-10-30 (×2): qty 1

## 2018-10-30 MED ORDER — ISOSORBIDE MONONITRATE ER 30 MG PO TB24
30.0000 mg | ORAL_TABLET | Freq: Every day | ORAL | Status: DC
  Administered 2018-10-30 – 2018-11-01 (×3): 30 mg via ORAL
  Filled 2018-10-30 (×3): qty 1

## 2018-10-30 MED ORDER — FUROSEMIDE 20 MG PO TABS: 20.0000 mg | ORAL_TABLET | Freq: Every day | ORAL | Status: DC | PRN

## 2018-10-30 MED ORDER — EZETIMIBE 10 MG PO TABS
10.0000 mg | ORAL_TABLET | Freq: Every day | ORAL | Status: DC
  Administered 2018-10-30 – 2018-11-01 (×3): 10 mg via ORAL
  Filled 2018-10-30 (×3): qty 1

## 2018-10-30 MED ORDER — INSULIN ASPART 100 UNIT/ML ~~LOC~~ SOLN: 0.0000 [IU] | Freq: Every day | SUBCUTANEOUS | Status: DC

## 2018-10-30 MED ORDER — LOSARTAN POTASSIUM 50 MG PO TABS
100.0000 mg | ORAL_TABLET | Freq: Every day | ORAL | Status: DC
  Administered 2018-10-31: 100 mg via ORAL
  Filled 2018-10-30: qty 2

## 2018-10-30 MED ORDER — FLUOXETINE HCL 20 MG PO CAPS
40.0000 mg | ORAL_CAPSULE | Freq: Every day | ORAL | Status: DC
  Administered 2018-10-31 – 2018-11-01 (×2): 40 mg via ORAL
  Filled 2018-10-30 (×2): qty 2

## 2018-10-30 MED ORDER — MAGNESIUM OXIDE 400 (241.3 MG) MG PO TABS
400.0000 mg | ORAL_TABLET | Freq: Two times a day (BID) | ORAL | Status: DC
  Administered 2018-10-30 – 2018-11-01 (×4): 400 mg via ORAL
  Filled 2018-10-30 (×4): qty 1

## 2018-10-30 MED ORDER — LEVOTHYROXINE SODIUM 100 MCG PO TABS
100.0000 ug | ORAL_TABLET | Freq: Every day | ORAL | Status: DC
  Administered 2018-10-31 – 2018-11-01 (×2): 100 ug via ORAL
  Filled 2018-10-30 (×2): qty 1

## 2018-10-30 MED ORDER — SODIUM CHLORIDE 0.9 % IV SOLN: 250.0000 mL | INTRAVENOUS | Status: DC | PRN

## 2018-10-30 MED ORDER — MAGNESIUM 400 MG PO CAPS: 400.0000 mg | ORAL_CAPSULE | Freq: Every day | ORAL | Status: DC

## 2018-10-30 MED ORDER — METFORMIN HCL ER 500 MG PO TB24
500.0000 mg | ORAL_TABLET | Freq: Every day | ORAL | Status: DC
  Administered 2018-10-31 – 2018-11-01 (×2): 500 mg via ORAL
  Filled 2018-10-30 (×2): qty 1

## 2018-10-30 MED ORDER — FLUOXETINE HCL 40 MG PO CAPS: 40.0000 mg | ORAL_CAPSULE | Freq: Every day | ORAL | Status: DC

## 2018-10-30 MED ORDER — NITROGLYCERIN 0.4 MG SL SUBL: 0.4000 mg | SUBLINGUAL_TABLET | SUBLINGUAL | Status: DC | PRN

## 2018-10-30 MED ORDER — METOPROLOL TARTRATE 25 MG PO TABS
25.0000 mg | ORAL_TABLET | Freq: Two times a day (BID) | ORAL | Status: DC
  Administered 2018-10-30 – 2018-11-01 (×4): 25 mg via ORAL
  Filled 2018-10-30 (×4): qty 1

## 2018-10-30 MED ORDER — POTASSIUM CHLORIDE CRYS ER 20 MEQ PO TBCR
20.0000 meq | EXTENDED_RELEASE_TABLET | Freq: Every day | ORAL | Status: DC
  Administered 2018-10-30 – 2018-11-01 (×3): 20 meq via ORAL
  Filled 2018-10-30 (×3): qty 1

## 2018-10-30 NOTE — ED Notes (Signed)
Patient denies pain and is resting comfortably.  

## 2018-10-30 NOTE — ED Notes (Signed)
Ambulated pt in hallway, started at 97-100%, then walking for down to 92-93% throughout the ambulation.  Once he got back to the room, O2 sat got down to 79%, but happened after he stop walking and was sitting down on bed.

## 2018-10-30 NOTE — ED Triage Notes (Signed)
Patient arrived via EMS reporting chest pain from Monday. He states that he does not have chest pain right now, he states he is "weak under stress and get shortness of breath." He also reports "hot flashes on Monday." Patient reports taking 324mg  ASA before EMS arrived

## 2018-10-30 NOTE — Progress Notes (Signed)
Attempted report 

## 2018-10-30 NOTE — H&P (Signed)
History and Physical   Curtis Perez:[id number] DOB: 1951/03/27 DOA: 10/30/2018  Referring MD/NP/PA: Dr. Sedonia Small  PCP: Unk Pinto, MD   Outpatient Specialists: Dr. Mertie Moores cardiology  Patient coming from: Home  Chief Complaint: Chest pain with exertion  HPI: Curtis Perez is a 67 y.o. male with medical history significant of coronary artery disease status post previous CABG, GERD, hypertension, hyperlipidemia, hypothyroidism with recent diagnosis of atrial fibrillation and previous EF of about 45% from last year Who came to the ER today with exertional dyspnea.  Patient was known to have significant stenosis.  In November of last year he has catheterization revealing 90% stenosis of the circumflex and occlusion of SVG to OM and SVG to RCA.  He had drug-eluting stent placed in the proximal circumflex with distal circumflex found to be 100% stenosis not amenable to balloon angioplasty.  At that time he did have the echo with akinesis of the inferior lateral and inferior myocardium with grade 2 diastolic dysfunction.  Patient has been on aspirin and Brilinta post cath.  When he was seen last month in the office he did have new onset atrial fibrillation but rate has been controlled.  At that point he reported being out of his Lasix.  Today however he is presenting with exertional dyspnea which has been progressive.  No evidence of overt fluid overload in the ER.  Patient's vitals are generally stable.  Due to his significant history is being admitted for evaluation of exertional dyspnea.  ED Course: Temperature is 97.6 blood pressure 150/94 with pulse 85 respiratory of 22 oxygen sat 83% on room air currently 100% 2 L.  His white count is 6.6 hemoglobin 12.2 and platelets count of 230.  BUN is 28 creatinine 1.55 glucose 139 lactic acid 3.21 and calcium 8.7.  Chest x-ray showed no significant fluid overload.  TSH is also within normal.  EKG showed A. fib with a rate of 91 with no significant  changes.  Patient is being admitted for evaluation  Review of Systems: As per HPI otherwise 10 point review of systems negative.    Past Medical History:  Diagnosis Date  . Acute inferior myocardial infarction ([place]) 10/24/2017  . Adenomatous colon polyp 02/15/2009  . Bradycardia   . CAD (coronary artery disease)    remote MI in 2004 with PCI; s/p CABG x 3 03/2012  . Depression   . Family history of malignant neoplasm of gastrointestinal tract   . GERD (gastroesophageal reflux disease)   . Hyperlipidemia   . Hypertension   . Hypothyroidism   . Myocardial infarction ([place])    2004  . Obesity   . Prediabetes   . 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 Tobacco abuse 03/28/2012  . Vitamin D deficiency     Past Surgical History:  Procedure Laterality Date  . BACK SURGERY    . COLONOSCOPY  02/15/2009  . CORONARY ANGIOPLASTY WITH STENT PLACEMENT     about 8 years ago at [facility]  . CORONARY ARTERY BYPASS GRAFT  04/02/2012   Procedure: CORONARY ARTERY BYPASS GRAFTING (CABG);  Surgeon: Rexene Alberts, MD;  Location: [place];  Service: Open Heart Surgery;  Laterality: N/A;  On pump, times three graphs using endoscopically harvested right greater saphenous vein and left internal mammary artery.   Remus Blake ACUTE MI REVASCULARIZATION N/A 10/23/2017   Procedure: Coronary/Graft Acute MI Revascularization;  Surgeon: Jettie Booze, MD;  Location: [place] CV [facility];  Service: Cardiovascular;  Laterality: N/A;  . KNEE SURGERY    . LEFT HEART CATH AND CORS/GRAFTS ANGIOGRAPHY N/A 10/23/2017   Procedure: LEFT HEART CATH AND CORS/GRAFTS ANGIOGRAPHY;  Surgeon: Jettie Booze, MD;  Location: [place] CV [facility];  Service: Cardiovascular;  Laterality: N/A;  . TONSILLECTOMY       reports that he has been smoking cigarettes. He has a 80.00 pack-year smoking history. He has never used smokeless tobacco. He reports that he does not  drink alcohol or use drugs.  Allergies  Allergen Reactions  . Lisinopril Cough    Family History  Problem Relation Age of Onset  . Hypertension Mother   . Alzheimer's disease Mother   . Colon cancer Father   . Colon cancer Paternal Grandmother   . Esophageal cancer Neg Hx   . Rectal cancer Neg Hx   . Stomach cancer Neg Hx      Prior to Admission medications   Medication Sig Start Date End Date Taking? Authorizing Provider  apixaban (ELIQUIS) 5 MG TABS tablet Take 1 tablet (5 mg total) by mouth 2 (two) times daily. 10/16/18  Yes Cheryln Manly, NP  aspirin 81 MG chewable tablet Chew 1 tablet (81 mg total) daily by mouth. 10/27/17  Yes Cheryln Manly, NP  atorvastatin (LIPITOR) 80 MG tablet TAKE 1 TABLET (80 MG TOTAL) DAILY AT 6 PM BY MOUTH. 10/25/18  Yes Unk Pinto, MD  Cyanocobalamin (VITAMIN B-12 PO) Take 1,000 mcg by mouth daily.    Yes [provider]  ezetimibe (ZETIA) 10 MG tablet TAKE 1 TABLET BY MOUTH EVERY DAY Patient taking differently: Take 10 mg by mouth daily.  09/05/18  Yes Unk Pinto, MD  FLUoxetine (PROZAC) 40 MG capsule TAKE 1 CAPSULE DAILY FOR MOOD Patient taking differently: Take 40 mg by mouth daily. FOR MOOD 04/17/18  Yes Unk Pinto, MD  IRON PO Take 65 mg (of elemental iron) every other day   Yes [provider]  isosorbide mononitrate (IMDUR) 30 MG 24 hr tablet TAKE 1 TABLET (30 MG TOTAL) DAILY BY MOUTH. 04/14/18  Yes Unk Pinto, MD  levothyroxine (SYNTHROID, LEVOTHROID) 100 MCG tablet TAKE 1 TABLET (100 MCG TOTAL) BY MOUTH DAILY BEFORE BREAKFAST. Patient taking differently: Take 100 mcg by mouth daily before breakfast.  10/02/18  Yes Unk Pinto, MD  losartan (COZAAR) 100 MG tablet TAKE 1/2 TO 1 TABLET DAILY FOR BLOOD PRESSURE Patient taking differently: Take 50-100 mg by mouth daily.  05/17/18  Yes Unk Pinto, MD  Magnesium 400 MG CAPS Take 400 mg by mouth 5 (five) times daily.    Yes [provider]  metFORMIN (GLUCOPHAGE-XR) 500 MG 24 hr tablet TAKE 1 TABLET WITH BREAKFAST AND LUNCH & 2 TABLETS AT SUPPER FOR DIABETES Patient taking differently: Take 500-1,000 mg by mouth See admin instructions. Take 1 tablet by mouth with breakfast and lunch and 2 tablets at supper for diabetes. 04/05/18  Yes Vicie Mutters, PA-C  metoprolol tartrate (LOPRESSOR) 25 MG tablet TAKE 1 TABLET (25 MG TOTAL) 2 (TWO) TIMES DAILY BY MOUTH. 06/09/18  Yes Unk Pinto, MD  nitroGLYCERIN (NITROSTAT) 0.4 MG SL tablet Place 1 tablet (0.4 mg total) every 5 (five) minutes as needed under the tongue. 10/26/17  Yes Cheryln Manly, NP  Omega-3 Fatty Acids (FISH OIL PO) Take 2,000 mg by mouth daily.   Yes [provider]  omeprazole (PRILOSEC) 40 MG capsule TAKE 1  CAPSULE DAILY FOR ACID INDIGESTION & REFLUX Patient taking differently: Take 40 mg by mouth daily. For acid Indigestion & Reflux 10/25/18  Yes Unk Pinto, MD  potassium chloride SA (K-DUR,KLOR-CON) 20 MEQ tablet Take 20 mEq by mouth daily.    Yes [provider]  Vitamin D, Ergocalciferol, (DRISDOL) 50000 UNITS CAPS capsule Take 1 capsule (50,000 Units total) by mouth 3 (three) times a week. Patient taking differently: Take 50,000 Units by mouth every Wednesday.  09/23/15  Yes Rolene Course, PA-C  furosemide (LASIX) 20 MG tablet Take 1 tablet (20 mg total) by mouth daily as needed for fluid. 04/02/18 10/15/18  Leanor Kail, PA    Physical Exam: Vitals:   10/30/18 1815 10/30/18 1830 10/30/18 1845 10/30/18 1900  BP: (!) 150/94 (!) 148/123 (!) 144/106 (!) 140/92  Pulse: (!) 58  82 70  Resp: 16 19 17 20   Temp:      TempSrc:      SpO2: 95%  100% 98%      Constitutional: NAD, calm, comfortable Vitals:   10/30/18 1815 10/30/18 1830 10/30/18 1845 10/30/18 1900  BP: (!) 150/94 (!) 148/123 (!) 144/106 (!) 140/92  Pulse: (!) 58  82 70  Resp: 16 19 17 20   Temp:      TempSrc:      SpO2: 95%  100% 98%   Eyes:  PERRL, lids and conjunctivae normal ENMT: Mucous membranes are moist. Posterior pharynx clear of any exudate or lesions.Normal dentition.  Neck: normal, supple, no masses, no thyromegaly Respiratory: clear to auscultation bilaterally, no wheezing, no crackles. Normal respiratory effort. No accessory muscle use.  Cardiovascular: Irregularly irregular rhythm, no murmurs / rubs / gallops. No extremity edema. 2+ pedal pulses. No carotid bruits.  Abdomen: no tenderness, no masses palpated. No hepatosplenomegaly. Bowel sounds positive.  Musculoskeletal: no clubbing / cyanosis. No joint deformity upper and lower extremities. Good ROM, no contractures. Normal muscle tone.  Skin: no rashes, lesions, ulcers. No induration Neurologic: CN 2-12 grossly intact. Sensation intact, DTR normal. Strength 5/5 in all 4.  Psychiatric: Normal judgment and insight. Alert and oriented x 3. Normal mood.     Labs on Admission: I have personally reviewed following labs and imaging studies  CBC: Recent Labs  Lab 10/30/18 1628  WBC 6.6  HGB 12.2*  HCT 38.2*  MCV 86.4  PLT 211   Basic Metabolic Panel: Recent Labs  Lab 10/30/18 1628  NA 135  K 4.2  CL 101  CO2 21*  GLUCOSE 139*  BUN 28*  CREATININE 1.55*  CALCIUM 8.7*   GFR: CrCl cannot be calculated (Unknown ideal weight.). Liver Function Tests: Recent Labs  Lab 10/30/18 1628  AST 600*  ALT 501*  ALKPHOS 106  BILITOT 1.9*  PROT 5.6*  ALBUMIN 3.1*   No results for input(s): LIPASE, AMYLASE in the last 168 hours. No results for input(s): AMMONIA in the last 168 hours. Coagulation Profile: No results for input(s): INR, PROTIME in the last 168 hours. Cardiac Enzymes: No results for input(s): CKTOTAL, CKMB, CKMBINDEX, TROPONINI in the last 168 hours. BNP (last 3 results) No results for input(s): PROBNP in the last 8760 hours. HbA1C: No results for input(s): HGBA1C in the last 72 hours. CBG: No results for input(s): GLUCAP in the last 168  hours. Lipid Profile: No results for input(s): CHOL, HDL, LDLCALC, TRIG, CHOLHDL, LDLDIRECT in the last 72 hours. Thyroid Function Tests: Recent Labs    10/30/18 1628  TSH 2.797   Anemia Panel: No results for  input(s): VITAMINB12, FOLATE, FERRITIN, TIBC, IRON, RETICCTPCT in the last 72 hours. Urine analysis:    Component Value Date/Time   COLORURINE STRAW (A) 10/15/2018 1236   APPEARANCEUR CLEAR 10/15/2018 1236   LABSPEC 1.005 10/15/2018 1236   PHURINE 7.0 10/15/2018 1236   GLUCOSEU NEGATIVE 10/15/2018 1236   HGBUR NEGATIVE 10/15/2018 1236   BILIRUBINUR NEGATIVE 10/15/2018 1236   [place] 10/15/2018 1236   PROTEINUR NEGATIVE 10/15/2018 1236   UROBILINOGEN 0.2 03/29/2012 1449   NITRITE NEGATIVE 10/15/2018 1236   LEUKOCYTESUR NEGATIVE 10/15/2018 1236   Sepsis Labs: @LABRCNTIP (procalcitonin:4,lacticidven:4) )No results found for this or any previous visit (from the past 240 hour(s)).   Radiological Exams on Admission: Dg Chest 2 View  Result Date: 10/30/2018 CLINICAL DATA:  67 year old male with atrial fibrillation and shortness of breath during exertion for the past 4 days EXAM: CHEST - 2 VIEW COMPARISON:  Prior chest x-ray 10/15/2018 FINDINGS: Stable mild cardiomegaly. Patient is status post median sternotomy with evidence of prior multivessel CABG. No evidence of pulmonary edema, pneumothorax or pleural effusion. Mild hyperinflation and chronic bronchitic changes are similar compared to prior. IMPRESSION: Stable chest x-ray without evidence of acute cardiopulmonary process. Stable cardiomegaly and background hyperinflation suggesting underlying COPD. Electronically Signed   By: Jacqulynn Cadet M.D.   On: 10/30/2018 16:50    EKG: Independently reviewed.  Atrial fibrillation with a rate of 91 no significant changes  Assessment/Plan Principal Problem:   Dyspnea on exertion Active Problems:   Hypertension   Tobacco abuse   S/P CABG x 3   GERD (gastroesophageal  reflux disease)   Coronary artery disease involving coronary bypass graft of native heart with unstable angina pectoris (HCC)   Atrial fibrillation ([place])     #1 dyspnea on exertion: This could be due to patient's systolic dysfunction.  Based on his previous echocardiogram a year ago it appears to have a shown some decreased EF with akinesis.  Patient will be admitted and continue with cardiac medications.  We will give him IV Lasix at this point.  Since he is not overtly fluid overloaded this could reflect angina equivalent.  We will get cardiology involved for further evaluation.  #2 hypertension: Continue his home regimen.  #3 tobacco abuse: Counseling provided.  Patient will get nicotine patch as well.  #4 GERD: Continue with PPIs.  #5 morbid obesity: Counseling provided.  #6 persistent atrial fibrillation: Rate is controlled.  Patient is on Eliquis.  Continue monitor   DVT prophylaxis: Eliquis Code Status: Full code Family Communication: No family available Disposition Plan: To be determined but home slightly Consults called: Labile cardiology to be called in the morning Admission status: Observation   Severity of Illness: The appropriate patient status for this patient is OBSERVATION. Observation status is judged to be reasonable and necessary in order to provide the required intensity of service to ensure the patient's safety. The patient's presenting symptoms, physical exam findings, and initial radiographic and laboratory data in the context of their medical condition is felt to place them at decreased risk for further clinical deterioration. Furthermore, it is anticipated that the patient will be medically stable for discharge from the hospital within 2 midnights of admission. The following factors support the patient status of observation.   " The patient's presenting symptoms include exertional dyspnea. " The physical exam findings include no obvious fluid overload. " The  initial radiographic and laboratory data are chest x-ray showed no significant finding.     Barbette Merino MD [facility] Pager [phone]  If 7PM-7AM, please contact night-coverage [email or web address] Password [facility]  10/30/2018, 8:18 PM

## 2018-10-30 NOTE — ED Provider Notes (Signed)
[facility] Emergency Department Provider Note MRN:  [id number]  Arrival date & time: 10/30/18     Chief Complaint   Shortness of breath History of Present Illness   Curtis Perez is a 67 y.o. year-old male with a history of CAD, A. fib presenting to the ED with chief complaint of shortness of breath.  Patient explains that he felt well 3 days ago, woke up 2 days ago feeling severe malaise and fatigue.  Endorsing significant dyspnea on exertion since that time, only able to walk to the bathroom and back to his bed.  Endorsing clammy sensation Monday, but denies fevers.  Recent mild cough.  Endorses a "twinge" in the left side of his chest that is unchanged and has been present for several months.  Denies abdominal pain, no headache or vision change, no numbness or weakness in the arms or legs, no leg pain or swelling.  He believes that he is back in A. fib, explaining this is how he felt when he was admitted a few weeks ago.  Review of Systems  A complete 10 system review of systems was obtained and all systems are negative except as noted in the HPI and PMH.   Patient's Health History    Past Medical History:  Diagnosis Date  . Acute inferior myocardial infarction ([place]) 10/24/2017  . Adenomatous colon polyp 02/15/2009  . Bradycardia   . CAD (coronary artery disease)    remote MI in 2004 with PCI; s/p CABG x 3 03/2012  . Depression   . Family history of malignant neoplasm of gastrointestinal tract   . GERD (gastroesophageal reflux disease)   . Hyperlipidemia   . Hypertension   . Hypothyroidism   . Myocardial infarction ([place])    2004  . Obesity   . Prediabetes   . 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 Tobacco abuse 03/28/2012  . Vitamin D deficiency     Past Surgical History:  Procedure Laterality Date  . BACK SURGERY    . COLONOSCOPY  02/15/2009  . CORONARY ANGIOPLASTY WITH STENT PLACEMENT     about 8  years ago at [facility]  . CORONARY ARTERY BYPASS GRAFT  04/02/2012   Procedure: CORONARY ARTERY BYPASS GRAFTING (CABG);  Surgeon: Rexene Alberts, MD;  Location: [place];  Service: Open Heart Surgery;  Laterality: N/A;  On pump, times three graphs using endoscopically harvested right greater saphenous vein and left internal mammary artery.   Remus Blake ACUTE MI REVASCULARIZATION N/A 10/23/2017   Procedure: Coronary/Graft Acute MI Revascularization;  Surgeon: Jettie Booze, MD;  Location: [place] [facility];  Service: Cardiovascular;  Laterality: N/A;  . KNEE SURGERY    . LEFT HEART CATH AND CORS/GRAFTS ANGIOGRAPHY N/A 10/23/2017   Procedure: LEFT HEART CATH AND CORS/GRAFTS ANGIOGRAPHY;  Surgeon: Jettie Booze, MD;  Location: [place] CV [facility];  Service: Cardiovascular;  Laterality: N/A;  . TONSILLECTOMY      Family History  Problem Relation Age of Onset  . Hypertension Mother   . Alzheimer's disease Mother   . Colon cancer Father   . Colon cancer Paternal Grandmother   . Esophageal cancer Neg Hx   . Rectal cancer Neg Hx   . Stomach cancer Neg Hx     Social History   Socioeconomic History  . Marital status: Divorced    Spouse name: Not on file  .  Number of children: 0  . Years of education: Not on file  . Highest education level: Not on file  Occupational History  . Occupation: Disability  . Occupation: Counsellor for [employer]  . Financial resource strain: Not very hard  . Food insecurity:    Worry: Never true    Inability: Never true  . Transportation needs:    Medical: No    Non-medical: No  Tobacco Use  . Smoking status: Current Every Day Smoker    Packs/day: 2.00    Years: 40.00    Pack years: 80.00    Types: Cigarettes  . Smokeless tobacco: Never Used  . Tobacco comment: down from 2ppd  Substance and Sexual Activity  . Alcohol use: No    Alcohol/week: 2.0 standard drinks    Types: 1 Glasses of wine, 1 Shots of liquor per  week    Comment: Rarely  . Drug use: No  . Sexual activity: Not Currently  Lifestyle  . Physical activity:    Days per week: Patient refused    Minutes per session: Patient refused  . Stress: Patient refused  Relationships  . Social connections:    Talks on phone: Patient refused    Gets together: Patient refused    Attends religious service: Patient refused    Active member of club or organization: Patient refused    Attends meetings of clubs or organizations: Patient refused    Relationship status: Patient refused  . Intimate partner violence:    Fear of current or ex partner: Not on file    Emotionally abused: Not on file    Physically abused: Not on file    Forced sexual activity: Not on file  Other Topics Concern  . Not on file  Social History Narrative  . Not on file     Physical Exam  Vital Signs and Nursing Notes reviewed Vitals:   10/30/18 1900 10/30/18 2044  BP: (!) 140/92 (!) 144/102  Pulse: 70 76  Resp: 20 18  Temp:  97.6 F (36.4 C)  SpO2: 98% 99%    CONSTITUTIONAL: Well-appearing, NAD NEURO:  Alert and oriented x 3, no focal deficits EYES:  eyes equal and reactive ENT/NECK:  no LAD, no JVD CARDIO: Irregular rate, well-perfused, normal S1 and S2 PULM:  CTAB no wheezing or rhonchi GI/GU:  normal bowel sounds, non-distended, non-tender MSK/SPINE:  No gross deformities, no edema SKIN:  no rash, atraumatic PSYCH:  Appropriate speech and behavior  Diagnostic and Interventional Summary    EKG Interpretation  Date/Time:  Wednesday October 30 2018 15:51:12 EST Ventricular Rate:  91 PR Interval:    QRS Duration: 113 QT Interval:  388 QTC Calculation: 478 R Axis:   75 Text Interpretation:  Atrial fibrillation Borderline intraventricular conduction delay Borderline repolarization abnormality Borderline prolonged QT interval Confirmed by Gerlene Fee [phone]) on 10/30/2018 5:07:45 PM      Labs Reviewed  CBC - Abnormal; Notable for the following  components:      Result Value   Hemoglobin 12.2 (*)    HCT 38.2 (*)    nRBC 0.3 (*)    All other components within normal limits  COMPREHENSIVE METABOLIC PANEL - Abnormal; Notable for the following components:   CO2 21 (*)    Glucose, Bld 139 (*)    BUN 28 (*)    Creatinine, Ser 1.55 (*)    Calcium 8.7 (*)    Total Protein 5.6 (*)    Albumin 3.1 (*)  AST 600 (*)    ALT 501 (*)    Total Bilirubin 1.9 (*)    GFR calc non Af Amer 46 (*)    GFR calc Af Amer 53 (*)    All other components within normal limits  I-STAT CG4 LACTIC ACID, ED - Abnormal; Notable for the following components:   Lactic Acid, Venous 3.21 (*)    All other components within normal limits  I-STAT CG4 LACTIC ACID, ED - Abnormal; Notable for the following components:   Lactic Acid, Venous 2.52 (*)    All other components within normal limits  TSH  URINALYSIS, ROUTINE W REFLEX MICROSCOPIC  COMPREHENSIVE METABOLIC PANEL  CBC  TROPONIN I  TROPONIN I  TROPONIN I  I-STAT TROPONIN, ED    DG Chest 2 View  Final Result      Medications  vitamin B-12 (CYANOCOBALAMIN) tablet 1,000 mcg (1,000 mcg Oral Given 10/30/18 2318)  omega-3 acid ethyl esters (LOVAZA) capsule 1 g (1 g Oral Given 10/30/18 2245)  Vitamin D (Ergocalciferol) (DRISDOL) capsule 50,000 Units (50,000 Units Oral Given 10/30/18 2318)  aspirin chewable tablet 81 mg (has no administration in time range)  nitroGLYCERIN (NITROSTAT) SL tablet 0.4 mg (has no administration in time range)  potassium chloride SA (K-DUR,KLOR-CON) CR tablet 20 mEq (20 mEq Oral Given 10/30/18 2319)  furosemide (LASIX) tablet 20 mg (has no administration in time range)  isosorbide mononitrate (IMDUR) 24 hr tablet 30 mg (30 mg Oral Given 10/30/18 2318)  losartan (COZAAR) tablet 100 mg (has no administration in time range)  metoprolol tartrate (LOPRESSOR) tablet 25 mg (25 mg Oral Given 10/30/18 2245)  ezetimibe (ZETIA) tablet 10 mg (10 mg Oral Given 10/30/18 2318)    levothyroxine (SYNTHROID, LEVOTHROID) tablet 100 mcg (has no administration in time range)  apixaban (ELIQUIS) tablet 5 mg (5 mg Oral Given 10/30/18 2245)  pantoprazole (PROTONIX) EC tablet 40 mg (has no administration in time range)  atorvastatin (LIPITOR) tablet 80 mg (has no administration in time range)  sodium chloride flush (NS) 0.9 % injection 3 mL (3 mLs Intravenous Given 10/30/18 2247)  sodium chloride flush (NS) 0.9 % injection 3 mL (has no administration in time range)  0.9 %  sodium chloride infusion (has no administration in time range)  ondansetron (ZOFRAN) tablet 4 mg (has no administration in time range)    Or  ondansetron (ZOFRAN) injection 4 mg (has no administration in time range)  furosemide (LASIX) injection 20 mg (has no administration in time range)  magnesium oxide (MAG-OX) tablet 400 mg (400 mg Oral Given 10/30/18 2245)  metFORMIN (GLUCOPHAGE-XR) 24 hr tablet 500 mg (has no administration in time range)    And  metFORMIN (GLUCOPHAGE-XR) 24 hr tablet 1,000 mg (has no administration in time range)  FLUoxetine (PROZAC) capsule 40 mg (has no administration in time range)  insulin aspart (novoLOG) injection 0-9 Units (has no administration in time range)  insulin aspart (novoLOG) injection 0-5 Units (has no administration in time range)  sodium chloride 0.9 % bolus 500 mL (0 mLs Intravenous Stopped 10/30/18 1817)     Procedures Critical Care  ED Course and Medical Decision Making  I have reviewed the triage vital signs and the nursing notes.  Pertinent labs & imaging results that were available during my care of the patient were reviewed by me and considered in my medical decision making (see below for details).  Considering symptomatic A. fib versus metabolic disarray in this 67 year old male with dyspnea on exertion, general malaise.  Also considering anemia.  Patient's atypical and very mild chest pain has been present for several months but still considering  cardiac etiology.  A. fib noted on monitor, work-up pending.  Work-up reveals mild lactic acidosis, mild AKI.  Pulse ox decreased to 79% during ambulatory testing.  Question of symptomatic A. fib versus COPD exacerbation.  Admitted to hospitalist service for further care and evaluation.  Barth Kirks. Sedonia Small, [place] [email or web address]  Final Clinical Impressions(s) / ED Diagnoses     ICD-10-CM   1. AKI (acute kidney injury) ([place]) N17.9   2. SOB (shortness of breath) R06.02 DG Chest 2 View    DG Chest 2 View  3. Dyspnea on exertion R06.09     ED Discharge Orders    None         Maudie Flakes, MD 10/30/18 2321

## 2018-10-31 ENCOUNTER — Encounter (HOSPITAL_COMMUNITY): Payer: Self-pay | Admitting: General Practice

## 2018-10-31 ENCOUNTER — Observation Stay (HOSPITAL_BASED_OUTPATIENT_CLINIC_OR_DEPARTMENT_OTHER): Payer: 59

## 2018-10-31 ENCOUNTER — Other Ambulatory Visit: Payer: Self-pay

## 2018-10-31 DIAGNOSIS — Z79890 Hormone replacement therapy (postmenopausal): Secondary | ICD-10-CM | POA: Diagnosis not present

## 2018-10-31 DIAGNOSIS — J9601 Acute respiratory failure with hypoxia: Secondary | ICD-10-CM

## 2018-10-31 DIAGNOSIS — I110 Hypertensive heart disease with heart failure: Secondary | ICD-10-CM | POA: Diagnosis not present

## 2018-10-31 DIAGNOSIS — R740 Nonspecific elevation of levels of transaminase and lactic acid dehydrogenase [LDH]: Secondary | ICD-10-CM

## 2018-10-31 DIAGNOSIS — E785 Hyperlipidemia, unspecified: Secondary | ICD-10-CM | POA: Diagnosis present

## 2018-10-31 DIAGNOSIS — E1122 Type 2 diabetes mellitus with diabetic chronic kidney disease: Secondary | ICD-10-CM | POA: Diagnosis not present

## 2018-10-31 DIAGNOSIS — I340 Nonrheumatic mitral (valve) insufficiency: Secondary | ICD-10-CM | POA: Diagnosis not present

## 2018-10-31 DIAGNOSIS — I5023 Acute on chronic systolic (congestive) heart failure: Secondary | ICD-10-CM | POA: Diagnosis not present

## 2018-10-31 DIAGNOSIS — J449 Chronic obstructive pulmonary disease, unspecified: Secondary | ICD-10-CM | POA: Diagnosis present

## 2018-10-31 DIAGNOSIS — I4891 Unspecified atrial fibrillation: Secondary | ICD-10-CM

## 2018-10-31 DIAGNOSIS — K219 Gastro-esophageal reflux disease without esophagitis: Secondary | ICD-10-CM | POA: Diagnosis present

## 2018-10-31 DIAGNOSIS — Z79899 Other long term (current) drug therapy: Secondary | ICD-10-CM | POA: Diagnosis not present

## 2018-10-31 DIAGNOSIS — I252 Old myocardial infarction: Secondary | ICD-10-CM | POA: Diagnosis not present

## 2018-10-31 DIAGNOSIS — F17200 Nicotine dependence, unspecified, uncomplicated: Secondary | ICD-10-CM | POA: Diagnosis present

## 2018-10-31 DIAGNOSIS — R0609 Other forms of dyspnea: Secondary | ICD-10-CM

## 2018-10-31 DIAGNOSIS — Z820 Family history of epilepsy and other diseases of the nervous system: Secondary | ICD-10-CM | POA: Diagnosis not present

## 2018-10-31 DIAGNOSIS — Z8249 Family history of ischemic heart disease and other diseases of the circulatory system: Secondary | ICD-10-CM | POA: Diagnosis not present

## 2018-10-31 DIAGNOSIS — Z66 Do not resuscitate: Secondary | ICD-10-CM | POA: Diagnosis not present

## 2018-10-31 DIAGNOSIS — N179 Acute kidney failure, unspecified: Secondary | ICD-10-CM | POA: Diagnosis not present

## 2018-10-31 DIAGNOSIS — Z6829 Body mass index (BMI) 29.0-29.9, adult: Secondary | ICD-10-CM | POA: Diagnosis not present

## 2018-10-31 DIAGNOSIS — I4819 Other persistent atrial fibrillation: Secondary | ICD-10-CM

## 2018-10-31 DIAGNOSIS — K761 Chronic passive congestion of liver: Secondary | ICD-10-CM | POA: Diagnosis present

## 2018-10-31 DIAGNOSIS — I5043 Acute on chronic combined systolic (congestive) and diastolic (congestive) heart failure: Secondary | ICD-10-CM | POA: Diagnosis present

## 2018-10-31 DIAGNOSIS — I25700 Atherosclerosis of coronary artery bypass graft(s), unspecified, with unstable angina pectoris: Secondary | ICD-10-CM | POA: Diagnosis not present

## 2018-10-31 DIAGNOSIS — E039 Hypothyroidism, unspecified: Secondary | ICD-10-CM | POA: Diagnosis present

## 2018-10-31 DIAGNOSIS — R7401 Elevation of levels of liver transaminase levels: Secondary | ICD-10-CM | POA: Diagnosis present

## 2018-10-31 DIAGNOSIS — Z7901 Long term (current) use of anticoagulants: Secondary | ICD-10-CM | POA: Diagnosis not present

## 2018-10-31 DIAGNOSIS — N182 Chronic kidney disease, stage 2 (mild): Secondary | ICD-10-CM | POA: Diagnosis not present

## 2018-10-31 DIAGNOSIS — Z7982 Long term (current) use of aspirin: Secondary | ICD-10-CM | POA: Diagnosis not present

## 2018-10-31 DIAGNOSIS — Z800 Family history of malignant neoplasm of digestive organs: Secondary | ICD-10-CM | POA: Diagnosis not present

## 2018-10-31 DIAGNOSIS — I10 Essential (primary) hypertension: Secondary | ICD-10-CM

## 2018-10-31 DIAGNOSIS — E119 Type 2 diabetes mellitus without complications: Secondary | ICD-10-CM | POA: Diagnosis present

## 2018-10-31 DIAGNOSIS — R0602 Shortness of breath: Secondary | ICD-10-CM | POA: Diagnosis present

## 2018-10-31 LAB — URINALYSIS, ROUTINE W REFLEX MICROSCOPIC
Bilirubin Urine: NEGATIVE
Glucose, UA: NEGATIVE mg/dL
Hgb urine dipstick: NEGATIVE
Ketones, ur: NEGATIVE mg/dL
Leukocytes, UA: NEGATIVE
Nitrite: NEGATIVE
Protein, ur: NEGATIVE mg/dL
Specific Gravity, Urine: 1.004 — ABNORMAL LOW (ref 1.005–1.030)
pH: 6 (ref 5.0–8.0)

## 2018-10-31 LAB — COMPREHENSIVE METABOLIC PANEL
ALT: 440 U/L — ABNORMAL HIGH (ref 0–44)
AST: 377 U/L — ABNORMAL HIGH (ref 15–41)
Albumin: 2.9 g/dL — ABNORMAL LOW (ref 3.5–5.0)
Alkaline Phosphatase: 111 U/L (ref 38–126)
Anion gap: 10 (ref 5–15)
BUN: 29 mg/dL — ABNORMAL HIGH (ref 8–23)
CO2: 24 mmol/L (ref 22–32)
Calcium: 8.6 mg/dL — ABNORMAL LOW (ref 8.9–10.3)
Chloride: 99 mmol/L (ref 98–111)
Creatinine, Ser: 1.37 mg/dL — ABNORMAL HIGH (ref 0.61–1.24)
GFR calc Af Amer: >60 mL/min (ref >60)
GFR calc non Af Amer: 53 mL/min — ABNORMAL LOW (ref >60)
Glucose, Bld: 105 mg/dL — ABNORMAL HIGH (ref 70–99)
Potassium: 4.3 mmol/L (ref 3.5–5.1)
Sodium: 133 mmol/L — ABNORMAL LOW (ref 135–145)
Total Bilirubin: 1.6 mg/dL — ABNORMAL HIGH (ref 0.3–1.2)
Total Protein: 5.2 g/dL — ABNORMAL LOW (ref 6.5–8.1)

## 2018-10-31 LAB — ECHOCARDIOGRAM COMPLETE
Height: 75 in
Weight: 3792 oz

## 2018-10-31 LAB — CBC
HCT: 36 % — ABNORMAL LOW (ref 39.0–52.0)
Hemoglobin: 11.9 g/dL — ABNORMAL LOW (ref 13.0–17.0)
MCH: 27.5 pg (ref 26.0–34.0)
MCHC: 33.1 g/dL (ref 30.0–36.0)
MCV: 83.1 fL (ref 80.0–100.0)
Platelets: 255 10*3/uL (ref 150–400)
RBC: 4.33 MIL/uL (ref 4.22–5.81)
RDW: 14.3 % (ref 11.5–15.5)
WBC: 8.4 10*3/uL (ref 4.0–10.5)
nRBC: 0 % (ref 0.0–0.2)

## 2018-10-31 LAB — TROPONIN I
Troponin I: 0.05 ng/mL (ref <0.03)
Troponin I: 0.04 ng/mL (ref <0.03)

## 2018-10-31 LAB — GLUCOSE, CAPILLARY
Glucose-Capillary: 112 mg/dL — ABNORMAL HIGH (ref 70–99)
Glucose-Capillary: 156 mg/dL — ABNORMAL HIGH (ref 70–99)
Glucose-Capillary: 109 mg/dL — ABNORMAL HIGH (ref 70–99)
Glucose-Capillary: 99 mg/dL (ref 70–99)

## 2018-10-31 MED ORDER — IPRATROPIUM-ALBUTEROL 0.5-2.5 (3) MG/3ML IN SOLN
3.0000 mL | Freq: Four times a day (QID) | RESPIRATORY_TRACT | Status: DC
  Administered 2018-10-31 (×2): 3 mL via RESPIRATORY_TRACT
  Filled 2018-10-31 (×2): qty 3

## 2018-10-31 MED ORDER — FUROSEMIDE 10 MG/ML IJ SOLN
40.0000 mg | Freq: Once | INTRAMUSCULAR | Status: AC
  Administered 2018-10-31: 40 mg via INTRAVENOUS
  Filled 2018-10-31: qty 4

## 2018-10-31 MED ORDER — DIPHENHYDRAMINE HCL 25 MG PO CAPS
25.0000 mg | ORAL_CAPSULE | Freq: Every evening | ORAL | Status: DC | PRN
  Administered 2018-10-31: 25 mg via ORAL
  Filled 2018-10-31: qty 1

## 2018-10-31 MED ORDER — IPRATROPIUM-ALBUTEROL 0.5-2.5 (3) MG/3ML IN SOLN
3.0000 mL | Freq: Three times a day (TID) | RESPIRATORY_TRACT | Status: DC
  Administered 2018-11-01 (×2): 3 mL via RESPIRATORY_TRACT
  Filled 2018-10-31 (×2): qty 3

## 2018-10-31 MED ORDER — FUROSEMIDE 10 MG/ML IJ SOLN
40.0000 mg | Freq: Two times a day (BID) | INTRAMUSCULAR | Status: DC
  Administered 2018-10-31 – 2018-11-01 (×2): 40 mg via INTRAVENOUS
  Filled 2018-10-31 (×2): qty 4

## 2018-10-31 MED ORDER — SACUBITRIL-VALSARTAN 24-26 MG PO TABS
1.0000 | ORAL_TABLET | Freq: Two times a day (BID) | ORAL | Status: DC
  Administered 2018-11-01: 1 via ORAL
  Filled 2018-10-31 (×2): qty 1

## 2018-10-31 NOTE — Progress Notes (Signed)
SATURATION QUALIFICATIONS: (This note is used to comply with regulatory documentation for home oxygen)  Patient Saturations on Room Air at Rest = 99%  Patient Saturations on Room Air while Ambulating = 98%  Patient Saturations on 0 Liters of oxygen while Ambulating = 98%  Please briefly explain why patient needs home oxygen: pt sats are normal; however, pt has dyspnea with exertion

## 2018-10-31 NOTE — Progress Notes (Signed)
RN rounded on pt. Pt states he does not need anything at this time. 

## 2018-10-31 NOTE — Progress Notes (Signed)
Benefit check in progress for Entresto. B Zamirah Denny RN,MHA,BSN [phone] 

## 2018-10-31 NOTE — Progress Notes (Signed)
Pt code status changed to DNR per pt request. RN placed DNR bracelet on pt's arm.

## 2018-10-31 NOTE — Progress Notes (Signed)
Pt requests medication to help him sleep. Pt states he has "hardly slept in four days." RN notified MD. No new orders.

## 2018-10-31 NOTE — Progress Notes (Signed)
  Echocardiogram 2D Echocardiogram has been performed.  Darlina Sicilian M 10/31/2018, 8:21 AM

## 2018-10-31 NOTE — Plan of Care (Signed)
  Problem: Education: Goal: Knowledge of General Education information will improve Description: Including pain rating scale, medication(s)/side effects and non-pharmacologic comfort measures Outcome: Progressing   Problem: Health Behavior/Discharge Planning: Goal: Ability to manage health-related needs will improve Outcome: Progressing   Problem: Clinical Measurements: Goal: Ability to maintain clinical measurements within normal limits will improve Outcome: Progressing   Problem: Activity: Goal: Risk for activity intolerance will decrease Outcome: Progressing   Problem: Nutrition: Goal: Adequate nutrition will be maintained Outcome: Progressing   Problem: Coping: Goal: Level of anxiety will decrease Outcome: Progressing   Problem: Pain Managment: Goal: General experience of comfort will improve Outcome: Progressing   Problem: Skin Integrity: Goal: Risk for impaired skin integrity will decrease Outcome: Progressing   

## 2018-10-31 NOTE — Progress Notes (Signed)
PT. With troponin of 0.04. On call for [facility] paged to make aware.

## 2018-10-31 NOTE — Care Management (Signed)
Entresto requires Prior Auth however the [place] only come in 40mg  and is covered the co pay 60 for 30 days retail is 35.00 and 90 day supply mail order is 87.50

## 2018-10-31 NOTE — Discharge Instructions (Signed)

## 2018-10-31 NOTE — Consult Note (Addendum)
Cardiology Consultation:   Patient ID: Curtis Perez MRN: [id number]; DOB: 13-Oct-1951  Admit date: 10/30/2018 Date of Consult: 10/31/2018  Primary Care Provider: Unk Pinto, MD Primary Cardiologist: No primary care provider on file.  Primary Electrophysiologist:  None    Patient Profile:   Curtis Perez is a 67 y.o. male with CAD s/p CABGx3 in 2013 and subsequent stent placement in 2018, COPD, hypothyroidism, hypertension, hyperlipidemia who is being seen today for the evaluation of chest pain and dyspnea at the request of Dr. Reesa Chew.  History of Present Illness:   Mr. Curtis Perez is a 67 y.o male with CAD s/p CABGx3 in 2013 and subsequent stent placement in 2018, COPD, recently diagnosed atrial fibrillation 2 weeks ago, hypertension, hypothyroidism, hyperlipidemia who presented with 3 day history of weakness and exertional dyspnea. The patient stated that he could previously walk 30 to 40 feet but he noticed that he could not even walk 10 to 15 feet without getting short of breath. He was noted to be hypoxic down to 83% on room air. Troponin was mildly elevated to 0.04. Chest xray did not show any infiltrate, effusions, or vascular congestion. EKG on 10/30/18 showed atrial fibrillation with normal rate and prolonged qt.   The patient mentioned that over the past year he has been eating out a lot more and not fluid restricting himself.  He states that he is unable to cook at home because he has been having difficult time standing up after his cardiac procedure.    Patient's last echo in Nov 2018 showed lvef 40-45%, akinesis and scarring of inferolateral and inferior myocardium, g2dd, severely dilated left atrium, mildly dilated right atrium. Left heart cath was also done in Nov 2018 which showed 100% stenosed prox rca, 90% stenosed prox lad lesion, 99% stenosed prox cx lesion with des placed. He was placed on dapt with aspirin and brilinta for a year subsequently.  Patient was recently admitted  from 10/29-10/30 with new onset atrial fibrillation and acute on chronic systolic heart failure likely due to running out of his lasix. He received iv diuresis, brilinta was stopped and he was started on eliquis.   Patient is being treated for presumed heart failure exacerbation with iv diuresis. Thus far, the patient has diuresed close to 2L thus far. Patient's weight is 108kg which is decreased from 112kgs which he was on day of discharge during his alast hospitalization. Cardiology consulted due to patient's continued dyspnea.   Past Medical History:  Diagnosis Date  . Acute inferior myocardial infarction ([place]) 10/24/2017  . Adenomatous colon polyp 02/15/2009  . Bradycardia   . CAD (coronary artery disease)    remote MI in 2004 with PCI; s/p CABG x 3 03/2012  . COPD (chronic obstructive pulmonary disease) ([place])   . Depression   . Family history of malignant neoplasm of gastrointestinal tract   . GERD (gastroesophageal reflux disease)   . Hyperlipidemia   . Hypertension   . Hypothyroidism   . Myocardial infarction ([place])    2004  . Obesity   . Prediabetes   . 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 Tobacco abuse 03/28/2012  . Vitamin D deficiency     Past Surgical History:  Procedure Laterality Date  . BACK SURGERY    . COLONOSCOPY  02/15/2009  . CORONARY ANGIOPLASTY WITH STENT PLACEMENT     about 8 years ago at [facility]  .  CORONARY ARTERY BYPASS GRAFT  04/02/2012   Procedure: CORONARY ARTERY BYPASS GRAFTING (CABG);  Surgeon: Rexene Alberts, MD;  Location: [place];  Service: Open Heart Surgery;  Laterality: N/A;  On pump, times three graphs using endoscopically harvested right greater saphenous vein and left internal mammary artery.   [facility] ACUTE MI REVASCULARIZATION N/A 10/23/2017   Procedure: Coronary/Graft Acute MI Revascularization;  Surgeon: Jettie Booze, MD;  Location: [place] [facility];  Service:  Cardiovascular;  Laterality: N/A;  . KNEE SURGERY    . LEFT HEART CATH AND CORS/GRAFTS ANGIOGRAPHY N/A 10/23/2017   Procedure: LEFT HEART CATH AND CORS/GRAFTS ANGIOGRAPHY;  Surgeon: Jettie Booze, MD;  Location: [place] [facility];  Service: Cardiovascular;  Laterality: N/A;  . TONSILLECTOMY       Home Medications:  Prior to Admission medications   Medication Sig Start Date End Date Taking? Authorizing Provider  apixaban (ELIQUIS) 5 MG TABS tablet Take 1 tablet (5 mg total) by mouth 2 (two) times daily. 10/16/18  Yes Cheryln Manly, NP  aspirin 81 MG chewable tablet Chew 1 tablet (81 mg total) daily by mouth. 10/27/17  Yes Cheryln Manly, NP  atorvastatin (LIPITOR) 80 MG tablet TAKE 1 TABLET (80 MG TOTAL) DAILY AT 6 PM BY MOUTH. 10/25/18  Yes Unk Pinto, MD  Cyanocobalamin (VITAMIN B-12 PO) Take 1,000 mcg by mouth daily.    Yes [provider]  ezetimibe (ZETIA) 10 MG tablet TAKE 1 TABLET BY MOUTH EVERY DAY Patient taking differently: Take 10 mg by mouth daily.  09/05/18  Yes Unk Pinto, MD  FLUoxetine (PROZAC) 40 MG capsule TAKE 1 CAPSULE DAILY FOR MOOD Patient taking differently: Take 40 mg by mouth daily. FOR MOOD 04/17/18  Yes Unk Pinto, MD  IRON PO Take 65 mg (of elemental iron) every other day   Yes [provider]  isosorbide mononitrate (IMDUR) 30 MG 24 hr tablet TAKE 1 TABLET (30 MG TOTAL) DAILY BY MOUTH. 04/14/18  Yes Unk Pinto, MD  levothyroxine (SYNTHROID, LEVOTHROID) 100 MCG tablet TAKE 1 TABLET (100 MCG TOTAL) BY MOUTH DAILY BEFORE BREAKFAST. Patient taking differently: Take 100 mcg by mouth daily before breakfast.  10/02/18  Yes Unk Pinto, MD  losartan (COZAAR) 100 MG tablet TAKE 1/2 TO 1 TABLET DAILY FOR BLOOD PRESSURE Patient taking differently: Take 50-100 mg by mouth daily.  05/17/18  Yes Unk Pinto, MD  Magnesium 400 MG CAPS Take 400 mg by mouth 5 (five) times daily.    Yes [provider]    metFORMIN (GLUCOPHAGE-XR) 500 MG 24 hr tablet TAKE 1 TABLET WITH BREAKFAST AND LUNCH & 2 TABLETS AT SUPPER FOR DIABETES Patient taking differently: Take 500-1,000 mg by mouth See admin instructions. Take 1 tablet by mouth with breakfast and lunch and 2 tablets at supper for diabetes. 04/05/18  Yes Vicie Mutters, PA-C  metoprolol tartrate (LOPRESSOR) 25 MG tablet TAKE 1 TABLET (25 MG TOTAL) 2 (TWO) TIMES DAILY BY MOUTH. 06/09/18  Yes Unk Pinto, MD  nitroGLYCERIN (NITROSTAT) 0.4 MG SL tablet Place 1 tablet (0.4 mg total) every 5 (five) minutes as needed under the tongue. 10/26/17  Yes Cheryln Manly, NP  Omega-3 Fatty Acids (FISH OIL PO) Take 2,000 mg by mouth daily.   Yes [provider]  omeprazole (PRILOSEC) 40 MG capsule TAKE 1 CAPSULE DAILY FOR ACID INDIGESTION & REFLUX Patient taking differently: Take 40 mg by mouth daily. For acid Indigestion & Reflux 10/25/18  Yes Unk Pinto, MD  potassium  chloride SA (K-DUR,KLOR-CON) 20 MEQ tablet Take 20 mEq by mouth daily.    Yes [provider]  Vitamin D, Ergocalciferol, (DRISDOL) 50000 UNITS CAPS capsule Take 1 capsule (50,000 Units total) by mouth 3 (three) times a week. Patient taking differently: Take 50,000 Units by mouth every Wednesday.  09/23/15  Yes Rolene Course, PA-C  furosemide (LASIX) 20 MG tablet Take 1 tablet (20 mg total) by mouth daily as needed for fluid. 04/02/18 10/15/18  Leanor Kail, PA    Inpatient Medications: Scheduled Meds: . apixaban  5 mg Oral BID  . aspirin  81 mg Oral Daily  . atorvastatin  80 mg Oral q1800  . ezetimibe  10 mg Oral Daily  . FLUoxetine  40 mg Oral Daily  . furosemide  40 mg Intravenous BID  . insulin aspart  0-5 Units Subcutaneous QHS  . insulin aspart  0-9 Units Subcutaneous TID WC  . ipratropium-albuterol  3 mL Nebulization Q6H  . isosorbide mononitrate  30 mg Oral Daily  . levothyroxine  100 mcg Oral QAC breakfast  . losartan  100 mg Oral Daily  .  magnesium oxide  400 mg Oral BID  . metFORMIN  500 mg Oral Q breakfast   And  . metFORMIN  1,000 mg Oral Q supper  . metoprolol tartrate  25 mg Oral BID  . omega-3 acid ethyl esters  1 g Oral BID  . pantoprazole  40 mg Oral Daily  . potassium chloride SA  20 mEq Oral Daily  . sodium chloride flush  3 mL Intravenous Q12H  . vitamin B-12  1,000 mcg Oral Daily  . Vitamin D (Ergocalciferol)  50,000 Units Oral Q Wed   Continuous Infusions: . sodium chloride     PRN Meds: sodium chloride, diphenhydrAMINE, furosemide, nitroGLYCERIN, ondansetron **OR** ondansetron (ZOFRAN) IV, sodium chloride flush  Allergies:    Allergies  Allergen Reactions  . Lisinopril Cough    Social History:   Social History   Socioeconomic History  . Marital status: Divorced    Spouse name: Not on file  . Number of children: 0  . Years of education: Not on file  . Highest education level: Not on file  Occupational History  . Occupation: Disability  . Occupation: Counsellor for [employer]  . Financial resource strain: Not very hard  . Food insecurity:    Worry: Never true    Inability: Never true  . Transportation needs:    Medical: No    Non-medical: No  Tobacco Use  . Smoking status: Current Every Day Smoker    Packs/day: 2.00    Years: 40.00    Pack years: 80.00    Types: Cigarettes  . Smokeless tobacco: Never Used  . Tobacco comment: down from 2ppd  Substance and Sexual Activity  . Alcohol use: No    Alcohol/week: 2.0 standard drinks    Types: 1 Glasses of wine, 1 Shots of liquor per week    Comment: Rarely  . Drug use: No  . Sexual activity: Not Currently  Lifestyle  . Physical activity:    Days per week: Patient refused    Minutes per session: Patient refused  . Stress: Patient refused  Relationships  . Social connections:    Talks on phone: Patient refused    Gets together: Patient refused    Attends religious service: Patient refused    Active member of club  or organization: Patient refused    Attends meetings of clubs or organizations:  Patient refused    Relationship status: Patient refused  . Intimate partner violence:    Fear of current or ex partner: Not on file    Emotionally abused: Not on file    Physically abused: Not on file    Forced sexual activity: Not on file  Other Topics Concern  . Not on file  Social History Narrative  . Not on file    Family History:    Family History  Problem Relation Age of Onset  . Hypertension Mother   . Alzheimer's disease Mother   . Colon cancer Father   . Colon cancer Paternal Grandmother   . Esophageal cancer Neg Hx   . Rectal cancer Neg Hx   . Stomach cancer Neg Hx      ROS:  Please see the history of present illness.   All other ROS reviewed and negative.     Physical Exam/Data:   Vitals:   10/31/18 0022 10/31/18 0513 10/31/18 0841 10/31/18 1148  BP: 115/88 (!) 136/100 (!) 134/98 117/75  Pulse: (!) 56 85 85 68  Resp: 18 18  19   Temp: (!) 97.4 F (36.3 C) 97.6 F (36.4 C)  (!) 97.5 F (36.4 C)  TempSrc: Oral Oral  Oral  SpO2: 96% 95%  98%  Weight:  107.5 kg    Height:        Intake/Output Summary (Last 24 hours) at 10/31/2018 1302 Last data filed at 10/31/2018 1040 Gross per 24 hour  Intake 462 ml  Output 975 ml  Net -513 ml   Filed Weights   10/30/18 2044 10/31/18 0513  Weight: 108.2 kg 107.5 kg   Body mass index is 29.62 kg/m.   Physical Exam  Constitutional: Appears well-developed and well-nourished. No distress.  HENT:  Head: Normocephalic and atraumatic.  Eyes: Conjunctivae are normal.  Cardiovascular: Normal rate, regular rhythm and normal heart sounds.  Respiratory: Effort normal and breath sounds normal. No respiratory distress. No wheezes.  GI: Soft. Bowel sounds are normal. No distension. There is no tenderness.  Musculoskeletal: No edema.  Neurological: Is alert.  Skin: Not diaphoretic. No erythema.  Psychiatric: Normal mood and affect. Behavior  is normal. Judgment and thought content normal.    EKG 10/30/18:  The EKG was personally reviewed and demonstrates:  Atrial fibrillation, normal rate, prolonged qt  Telemetry:  Telemetry was personally reviewed and demonstrates:  Atrial fibrilaltion  Relevant CV Studies:  Left heart cath Nov 2018  Prox Cx lesion is 99% stenosed. SVG to OM is occluded. Thrombectomy, followed by a drug-eluting stent which was successfully placed using a STENT SYNERGY DES 3.5X20, postdilated to 3.75.  Post intervention, there is a 0% residual stenosis.  Ost 1st Mrg lesion is 70% stenosed. Balloon angioplasty was performed in the ostial vessel before and after circumflex stent placement, using a BALLOON SAPPHIRE 2.5X12.  Post intervention, there is a 20% residual stenosis.  Dist Cx lesion is 100% stenosed. Balloon angioplasty ws attempted but the lesion was too distal in the vessel.  Prox LAD lesion is 90% stenosed. LIMA to LAD is patent.  Prox RCA lesion is 100% stenosed. SVG to RCA is occluded.  LV end diastolic pressure is mildly elevated.  There is no aortic valve stenosis.    TTE Nov 2018  - Left ventricle: The cavity size was mildly dilated. There was   mild concentric hypertrophy. Systolic function was mildly to   moderately reduced. The estimated ejection fraction was in the   range of 40%  to 45%. Akinesis and scarring of the   entireinferolateral and inferior myocardium; consistent with   infarction in the distribution of the right coronary and left   circumflex coronary artery. There was a reduced contribution of   atrial contraction to ventricular filling, due to increased   ventricular diastolic pressure or atrial contractile dysfunction.   Features are consistent with a pseudonormal left ventricular   filling pattern, with concomitant abnormal relaxation and   increased filling pressure (grade 2 diastolic dysfunction). - Mitral valve: There was moderate to severe regurgitation  directed   centrally. - Left atrium: The atrium was severely dilated. - Right atrium: The atrium was mildly dilated. - Pulmonary arteries: PA peak pressure: 37 mm Hg (S).    Laboratory Data:  Chemistry Recent Labs  Lab 10/30/18 1628 10/31/18 0213  NA 135 133*  K 4.2 4.3  CL 101 99  CO2 21* 24  GLUCOSE 139* 105*  BUN 28* 29*  CREATININE 1.55* 1.37*  CALCIUM 8.7* 8.6*  GFRNONAA 46* 53*  GFRAA 53* >60  ANIONGAP 13 10    Recent Labs  Lab 10/30/18 1628 10/31/18 0213  PROT 5.6* 5.2*  ALBUMIN 3.1* 2.9*  AST 600* 377*  ALT 501* 440*  ALKPHOS 106 111  BILITOT 1.9* 1.6*   Hematology Recent Labs  Lab 10/30/18 1628 10/31/18 0213  WBC 6.6 8.4  RBC 4.42 4.33  HGB 12.2* 11.9*  HCT 38.2* 36.0*  MCV 86.4 83.1  MCH 27.6 27.5  MCHC 31.9 33.1  RDW 14.2 14.3  PLT 230 255   Cardiac Enzymes Recent Labs  Lab 10/30/18 2205 10/31/18 0213 10/31/18 0830  TROPONINI 0.04* 0.05* 0.04*    Recent Labs  Lab 10/30/18 1647  TROPIPOC 0.04    BNPNo results for input(s): BNP, PROBNP in the last 168 hours.  DDimer No results for input(s): DDIMER in the last 168 hours.  Radiology/Studies:  Dg Chest 2 View  Result Date: 10/30/2018 CLINICAL DATA:  67 year old male with atrial fibrillation and shortness of breath during exertion for the past 4 days EXAM: CHEST - 2 VIEW COMPARISON:  Prior chest x-ray 10/15/2018 FINDINGS: Stable mild cardiomegaly. Patient is status post median sternotomy with evidence of prior multivessel CABG. No evidence of pulmonary edema, pneumothorax or pleural effusion. Mild hyperinflation and chronic bronchitic changes are similar compared to prior. IMPRESSION: Stable chest x-ray without evidence of acute cardiopulmonary process. Stable cardiomegaly and background hyperinflation suggesting underlying COPD. Electronically Signed   By: Jacqulynn Cadet M.D.   On: 10/30/2018 16:50    Assessment and Plan:   Acute on chronic systolic heart failure The patient was  resting comfortably in his bed and does not mention any dyspnea at this time.  He is breathing on room air and he states that he was able to walk back and forth to the bathroom without any dyspnea.  He mentioned that he feels much better after he got his Lasix today and has put out close to 2 L today. At home the patient is being prescribed lasix 20mg  qd for diuresis. The patient has received 60mg  of IV lasix thusfar. He will likely benefit from a higher dose.   Patient's last echo was done in Nov 2018 showed lvef 40-45%, akinesis and scarring of inferolateral and inferior myocardium, g2dd, severely dilated left atrium, mildly dilated right atrium.   Will check for worsening of patient's heart failure with echo.  Patient's creatinine is 1.37 today which is improved from prior 1.55. Patient has baseline cr ranging  1-1.3.   -TTE read pending -Daily weights and I/Os -Continue with Iv lasix 40mg  bid -discharge home on po lasix 40mg  qd -stop losartan  -start entresto 24-26 11/15 -low salt and fluid restricted diet  -Heart failure education  Atrial fibrillation  Patient is currently in atrial fibrillation rhythm. He takes eliquis and metoprolol at home.   -continue metoprolol 25mg  bid  -continue eliquis 5mg  bid  -Will schedule for TEE cardioversion outpatient -F/u in heartcare clinic in 1 week  Hypertension  Patient's blood pressure has been ranging 115-140s/80-100s. Patient is on losartan 50-100mg  qd at home.   -stop losartan 100mg  qd   CAD s/p CABG Patient had CABGx4 in 2013 and subsequent placement in 2018. He has been on aspirin 81mg , metoprolol 25mg  bid, imdur 30mg  qd, atorvastatin 80mg  qd, and ezetimbe 10mg  qd.   -Continue metoprolol 25mg  bid  -continue eliquis 5mg  bid    -continue imdur 30mg  qd -Continue atorvastatin 80mg  qd -continue aspirin 81mg  qd  For questions or updates, please contact [facility] Please consult [email or web address] for contact info under      Signed, Lars Mage, MD  10/31/2018 1:02 PM

## 2018-10-31 NOTE — Progress Notes (Signed)
[facility] PROGRESS NOTE  Curtis Perez BHA:[id number] DOB: 1951-04-30 DOA: 10/30/2018 PCP: Unk Pinto, MD  Assessment/Plan:   # Acute respiratory failure with hypoxia likely related to acute on chronic systolic heart failure in setting of recent afib and hx non-compliance with meds and diet and underlying lung disease. Chest xray with cardiomegaly and hyperinflation, oxygen saturation level 79% with ambulation, bnp 978. Mild LE edema. Remains sob. Lasix IV ordered -will increase lasix to 40mg  bid -obtain echo -oxygen supplementation as indicated -scheduled nebs -obtain daily weight -monitor intake and output -request cards consult  #2 Acute on chronic systolic heart failure. Patient admits to dietary indescretions and reports "running out" of meds. Echo 12 months ago with EF 40% and akinesis and grade 2 diastolic dysfunction. Chest xray as noted above. BNP elevated. Chest xray without cardio pulmonary process. Home meds include losartan and metoprolol and prn lasix.  IV lasix ordered -follow echo -IV lasix as noted above.  -continue metoprolol and losartan -obtain daily weight -monitor intake and output  #3 persistent atrial fibrillation: Rate is controlled.  Patient is on Eliquis. Recently diagnosed. Continue monitor  #4 Hypertension. Poor control. However, IV lasix had not been given and due for home anti-hypertensive medication. -IV lasix as noted above -continue home regimen -monitor  #5 Diabetes. Serum glucose 105. A1c 7/19 6.3. Home meds include metformin -continue to hold oral agents -SSI for optimal control  #6 COPD. Current smoker.  Chest Xray with hyperinflation -schedule nebs -may benefit OP pulmonary function tests  #7. CAD. S/p CABG. Cath last year with  90% stenosis of the circumflex and occlusion of SVG to OM and SVG to RCA. S/p drug eluting stent placedin the proximal circumflex with distal circumflex found to be 100% not amenable to  angioplasty. Patient denies chest pain now and on presentation. Troponin slightly elevated but flat. ekg without acute changes -continue home med -cardiology consult  #8. gerd stable at baseline  #9. AKI. Creatinine 1.55 on admission. Was 1.19 6 months ago. Likely related to above.  -monitor intake and output -recheck in am  #10. Tobacco use -cessation counseling offered  #11. Elevated transaminase level. Likely related to #2. Levels trending down slowly from yesterday. Total bili 1.6 down from 1.9 yesterday -see #2 -cmet in am  Code Status: full Family Communication: none present Disposition Plan: home when ready   Consultants:  [employer]master  Procedures:  echo  Antibiotics:    HPI/Subjective: Pt denies chest pain now or on presentation "I dont care what the file says, I dont have chest pain". Remain sob with conversation  Admitted last night with DOR workup revealed acute respiratory failure related to acute on chronic heart failure in setting of non-compliance and new afib  Objective: Vitals:   10/31/18 0513 10/31/18 0841  BP: (!) 136/100 (!) 134/98  Pulse: 85 85  Resp: 18   Temp: 97.6 F (36.4 C)   SpO2: 95%     Intake/Output Summary (Last 24 hours) at 10/31/2018 1022 Last data filed at 10/31/2018 1009 Gross per 24 hour  Intake 462 ml  Output 525 ml  Net -63 ml   Filed Weights   10/30/18 2044 10/31/18 0513  Weight: 108.2 kg 107.5 kg    Exam:   General:  Awake alert irritable looking older than stated age  Cardiovascular: irregularly irregular HS distant no mgr trace LE edema  Respiratory: mild increased work of breathing with conversation. Diminished BS fine crackles bilateral bases no wheeze  Abdomen: obese soft +BS  no guarding or rebouding  Musculoskeletal: joints without swelling/erythema   Data Reviewed: Basic Metabolic Panel: Recent Labs  Lab 10/30/18 1628 10/31/18 0213  NA 135 133*  K 4.2 4.3  CL 101 99  CO2 21* 24  GLUCOSE  139* 105*  BUN 28* 29*  CREATININE 1.55* 1.37*  CALCIUM 8.7* 8.6*   Liver Function Tests: Recent Labs  Lab 10/30/18 1628 10/31/18 0213  AST 600* 377*  ALT 501* 440*  ALKPHOS 106 111  BILITOT 1.9* 1.6*  PROT 5.6* 5.2*  ALBUMIN 3.1* 2.9*   No results for input(s): LIPASE, AMYLASE in the last 168 hours. No results for input(s): AMMONIA in the last 168 hours. CBC: Recent Labs  Lab 10/30/18 1628 10/31/18 0213  WBC 6.6 8.4  HGB 12.2* 11.9*  HCT 38.2* 36.0*  MCV 86.4 83.1  PLT 230 255   Cardiac Enzymes: Recent Labs  Lab 10/30/18 2205 10/31/18 0213 10/31/18 0830  TROPONINI 0.04* 0.05* 0.04*   BNP (last 3 results) Recent Labs    10/15/18 1348  BNP 978.3*    ProBNP (last 3 results) No results for input(s): PROBNP in the last 8760 hours.  CBG: Recent Labs  Lab 10/30/18 2325 10/31/18 0804  GLUCAP 196* 112*    No results found for this or any previous visit (from the past 240 hour(s)).   Studies: Dg Chest 2 View  Result Date: 10/30/2018 CLINICAL DATA:  67 year old male with atrial fibrillation and shortness of breath during exertion for the past 4 days EXAM: CHEST - 2 VIEW COMPARISON:  Prior chest x-ray 10/15/2018 FINDINGS: Stable mild cardiomegaly. Patient is status post median sternotomy with evidence of prior multivessel CABG. No evidence of pulmonary edema, pneumothorax or pleural effusion. Mild hyperinflation and chronic bronchitic changes are similar compared to prior. IMPRESSION: Stable chest x-ray without evidence of acute cardiopulmonary process. Stable cardiomegaly and background hyperinflation suggesting underlying COPD. Electronically Signed   By: Jacqulynn Cadet M.D.   On: 10/30/2018 16:50    Scheduled Meds: . apixaban  5 mg Oral BID  . aspirin  81 mg Oral Daily  . atorvastatin  80 mg Oral q1800  . ezetimibe  10 mg Oral Daily  . FLUoxetine  40 mg Oral Daily  . furosemide  40 mg Intravenous BID  . insulin aspart  0-5 Units Subcutaneous QHS  .  insulin aspart  0-9 Units Subcutaneous TID WC  . ipratropium-albuterol  3 mL Nebulization Q6H  . isosorbide mononitrate  30 mg Oral Daily  . levothyroxine  100 mcg Oral QAC breakfast  . losartan  100 mg Oral Daily  . magnesium oxide  400 mg Oral BID  . metFORMIN  500 mg Oral Q breakfast   And  . metFORMIN  1,000 mg Oral Q supper  . metoprolol tartrate  25 mg Oral BID  . omega-3 acid ethyl esters  1 g Oral BID  . pantoprazole  40 mg Oral Daily  . potassium chloride SA  20 mEq Oral Daily  . sodium chloride flush  3 mL Intravenous Q12H  . vitamin B-12  1,000 mcg Oral Daily  . Vitamin D (Ergocalciferol)  50,000 Units Oral Q Wed   Continuous Infusions: . sodium chloride      Principal Problem:   Acute respiratory failure with hypoxia (HCC) Active Problems:   Acute on chronic systolic heart failure (HCC)   Elevated transaminase level   Hypertension   Type 2 diabetes mellitus (HCC)   Coronary artery disease involving coronary bypass graft of  native heart with unstable angina pectoris (HCC)   Atrial fibrillation (HCC)   COPD (chronic obstructive pulmonary disease) (HCC)   Tobacco abuse   GERD (gastroesophageal reflux disease)   Acute kidney injury ([place])    Time spent: 40 min    [facility] M NP [facility]  If 7PM-7AM, please contact night-coverage at [email or web address], password [facility] 10/31/2018, 10:22 AM  LOS: 0 days

## 2018-10-31 NOTE — Plan of Care (Signed)
  Problem: Pain Managment: Goal: General experience of comfort will improve Outcome: Progressing   Problem: Safety: Goal: Ability to remain free from injury will improve Outcome: Progressing   

## 2018-10-31 NOTE — Care Management Note (Signed)
Case Management Note  Patient Details  Name: Curtis Perez MRN: [id number] Date of Birth: 1951/08/01  Subjective/Objective:       Dyspnea            Action/Plan: Patient lives at home; PCP: Unk Pinto, MD; has private insurance with [facility] with prescription drug coverage; independent of his ADL's; CM will continue to follow for progression of care. Expected Discharge Date:   possibly 11/03/2018               Expected Discharge Plan:  Home/Self Care  Discharge planning Services  CM Consult  Status of Service:  In process, will continue to follow  Sherrilyn Rist [phone] 10/31/2018, 10:39 AM

## 2018-11-01 DIAGNOSIS — E1122 Type 2 diabetes mellitus with diabetic chronic kidney disease: Secondary | ICD-10-CM

## 2018-11-01 DIAGNOSIS — N182 Chronic kidney disease, stage 2 (mild): Secondary | ICD-10-CM

## 2018-11-01 LAB — GLUCOSE, CAPILLARY
Glucose-Capillary: 104 mg/dL — ABNORMAL HIGH (ref 70–99)
Glucose-Capillary: 139 mg/dL — ABNORMAL HIGH (ref 70–99)

## 2018-11-01 LAB — COMPREHENSIVE METABOLIC PANEL
ALT: 344 U/L — ABNORMAL HIGH (ref 0–44)
AST: 145 U/L — ABNORMAL HIGH (ref 15–41)
Albumin: 3 g/dL — ABNORMAL LOW (ref 3.5–5.0)
Alkaline Phosphatase: 127 U/L — ABNORMAL HIGH (ref 38–126)
Anion gap: 11 (ref 5–15)
BUN: 24 mg/dL — ABNORMAL HIGH (ref 8–23)
CO2: 28 mmol/L (ref 22–32)
Calcium: 8.7 mg/dL — ABNORMAL LOW (ref 8.9–10.3)
Chloride: 95 mmol/L — ABNORMAL LOW (ref 98–111)
Creatinine, Ser: 1.41 mg/dL — ABNORMAL HIGH (ref 0.61–1.24)
GFR calc Af Amer: 59 mL/min — ABNORMAL LOW (ref >60)
GFR calc non Af Amer: 51 mL/min — ABNORMAL LOW (ref >60)
Glucose, Bld: 122 mg/dL — ABNORMAL HIGH (ref 70–99)
Potassium: 3.5 mmol/L (ref 3.5–5.1)
Sodium: 134 mmol/L — ABNORMAL LOW (ref 135–145)
Total Bilirubin: 1.5 mg/dL — ABNORMAL HIGH (ref 0.3–1.2)
Total Protein: 5.3 g/dL — ABNORMAL LOW (ref 6.5–8.1)

## 2018-11-01 LAB — HEMOGLOBIN A1C
Hgb A1c MFr Bld: 6.3 % — ABNORMAL HIGH (ref 4.8–5.6)
Mean Plasma Glucose: 134.11 mg/dL

## 2018-11-01 MED ORDER — FUROSEMIDE 40 MG PO TABS: 40.0000 mg | ORAL_TABLET | Freq: Every day | ORAL | 0 refills | Status: DC

## 2018-11-01 MED ORDER — IPRATROPIUM-ALBUTEROL 0.5-2.5 (3) MG/3ML IN SOLN: 3.0000 mL | RESPIRATORY_TRACT | Status: DC | PRN

## 2018-11-01 MED ORDER — SACUBITRIL-VALSARTAN 24-26 MG PO TABS: 1.0000 | ORAL_TABLET | Freq: Two times a day (BID) | ORAL | 0 refills | Status: DC

## 2018-11-01 MED FILL — ENTRESTO 24 MG-26 MG TABLET: 24-26 | 30 days supply | Qty: 60 | Fill #0

## 2018-11-01 MED FILL — FUROSEMIDE 40 MG TABLET: 40 | 30 days supply | Qty: 30 | Fill #0

## 2018-11-01 NOTE — Progress Notes (Signed)
Pt discharge instructions reviewed with pt. Pt verbalizes understanding and states he has no questions. Pt belongings with pt. Pt is not in distress. Pt's friend is coming to drive him home. Pharmacy brought pt medication.

## 2018-11-01 NOTE — Progress Notes (Signed)
Entresto coupon card given to patient with explanation of usage;B Slayter Moorhouse RN,MHA, BSN [phone] 

## 2018-11-01 NOTE — Progress Notes (Signed)
Progress Note  Patient Name: Curtis Perez Date of Encounter: 11/01/2018  Primary Cardiologist: No primary care provider on file.   Subjective   Symptomatically feeling good today he denies any chest pain, shortness of breath or lower extremity edema.  He put out 2.175 L yesterday and is net -1.67 L.  Weight is down 7 pounds.  Inpatient Medications    Scheduled Meds: . apixaban  5 mg Oral BID  . aspirin  81 mg Oral Daily  . atorvastatin  80 mg Oral q1800  . ezetimibe  10 mg Oral Daily  . FLUoxetine  40 mg Oral Daily  . furosemide  40 mg Intravenous BID  . insulin aspart  0-5 Units Subcutaneous QHS  . insulin aspart  0-9 Units Subcutaneous TID WC  . ipratropium-albuterol  3 mL Nebulization TID  . isosorbide mononitrate  30 mg Oral Daily  . levothyroxine  100 mcg Oral QAC breakfast  . magnesium oxide  400 mg Oral BID  . metFORMIN  500 mg Oral Q breakfast   And  . metFORMIN  1,000 mg Oral Q supper  . metoprolol tartrate  25 mg Oral BID  . omega-3 acid ethyl esters  1 g Oral BID  . pantoprazole  40 mg Oral Daily  . potassium chloride SA  20 mEq Oral Daily  . sacubitril-valsartan  1 tablet Oral BID  . sodium chloride flush  3 mL Intravenous Q12H  . vitamin B-12  1,000 mcg Oral Daily  . Vitamin D (Ergocalciferol)  50,000 Units Oral Q Wed   Continuous Infusions: . sodium chloride     PRN Meds: sodium chloride, diphenhydrAMINE, nitroGLYCERIN, ondansetron **OR** ondansetron (ZOFRAN) IV, sodium chloride flush   Vital Signs    Vitals:   11/01/18 0006 11/01/18 0510 11/01/18 0756 11/01/18 0815  BP: 105/60 118/84  114/82  Pulse: 82 82  82  Resp: 18 18    Temp: 98.2 F (36.8 C) 98 F (36.7 C)    TempSrc: Oral Oral    SpO2: 90% 92% 96%   Weight:  104.9 kg    Height:        Intake/Output Summary (Last 24 hours) at 11/01/2018 0907 Last data filed at 11/01/2018 9381 Gross per 24 hour  Intake 243 ml  Output 2175 ml  Net -1932 ml   Filed Weights   10/30/18 2044  10/31/18 0513 11/01/18 0510  Weight: 108.2 kg 107.5 kg 104.9 kg    Telemetry    Atrial fibrillation- Personally Reviewed  ECG    New EKG to review- Personally Reviewed  Physical Exam   GEN: No acute distress.   Neck: No JVD Cardiac:  Irregularly irregular, no murmurs, rubs, or gallops.  Respiratory: Clear to auscultation bilaterally. GI: Soft, nontender, non-distended  MS: No edema; No deformity. Neuro:  Nonfocal  Psych: Normal affect   Labs    Chemistry Recent Labs  Lab 10/30/18 1628 10/31/18 0213 11/01/18 0521  NA 135 133* 134*  K 4.2 4.3 3.5  CL 101 99 95*  CO2 21* 24 28  GLUCOSE 139* 105* 122*  BUN 28* 29* 24*  CREATININE 1.55* 1.37* 1.41*  CALCIUM 8.7* 8.6* 8.7*  PROT 5.6* 5.2* 5.3*  ALBUMIN 3.1* 2.9* 3.0*  AST 600* 377* 145*  ALT 501* 440* 344*  ALKPHOS 106 111 127*  BILITOT 1.9* 1.6* 1.5*  GFRNONAA 46* 53* 51*  GFRAA 53* >60 59*  ANIONGAP 13 10 11      Hematology Recent Labs  Lab 10/30/18 1628  10/31/18 0213  WBC 6.6 8.4  RBC 4.42 4.33  HGB 12.2* 11.9*  HCT 38.2* 36.0*  MCV 86.4 83.1  MCH 27.6 27.5  MCHC 31.9 33.1  RDW 14.2 14.3  PLT 230 255    Cardiac Enzymes Recent Labs  Lab 10/30/18 2205 10/31/18 0213 10/31/18 0830  TROPONINI 0.04* 0.05* 0.04*    Recent Labs  Lab 10/30/18 1647  TROPIPOC 0.04     BNPNo results for input(s): BNP, PROBNP in the last 168 hours.   DDimer No results for input(s): DDIMER in the last 168 hours.   Radiology    Dg Chest 2 View  Result Date: 10/30/2018 CLINICAL DATA:  67 year old male with atrial fibrillation and shortness of breath during exertion for the past 4 days EXAM: CHEST - 2 VIEW COMPARISON:  Prior chest x-ray 10/15/2018 FINDINGS: Stable mild cardiomegaly. Patient is status post median sternotomy with evidence of prior multivessel CABG. No evidence of pulmonary edema, pneumothorax or pleural effusion. Mild hyperinflation and chronic bronchitic changes are similar compared to prior.  IMPRESSION: Stable chest x-ray without evidence of acute cardiopulmonary process. Stable cardiomegaly and background hyperinflation suggesting underlying COPD. Electronically Signed   By: Jacqulynn Cadet M.D.   On: 10/30/2018 16:50    Cardiac Studies   2D echo Study Conclusions  - Left ventricle: The cavity size was normal. Wall thickness was   increased in a pattern of moderate LVH. Systolic function was   moderately to severely reduced. The estimated ejection fraction   was in the range of 30% to 35%. Diffuse hypokinesis. - Mitral valve: There was moderate regurgitation. - Left atrium: The atrium was moderately dilated. - Right atrium: The atrium was moderately to severely dilated. - Pulmonary arteries: Systolic pressure was moderately increased.   PA peak pressure: 42 mm Hg (S).  Patient Profile     67 y.o. male male with a history of CAD status post remote CABG x3 in 2013 and subsequent stent placement in 2018.  He also has a history of COPD, hypertension and hyperlipidemia.  His last cath in November 2018 showed an occluded proximal RCA, 90% proximal LAD and 99% proximal left circumflex and a drug-eluting stent was placed.  Echo in Nov 2018 showed lvef 40-45%, akinesis and scarring of inferolateral and inferior myocardium, g2dd, severely dilated left atrium, mildly dilated right atrium and moderate to severe MR.  Admitted with SOB and acute CHF.  Assessment & Plan    1.  Acute on chronic systolic/diastolic CHF -EF a year ago was 45% but has now decreased to 30 to 35%.  Question whether this is related to his recent diagnosis of atrial fibrillation with poorly controlled heart rate -He put out 2.175 L yesterday and is net -1.67 L.  His weight is down 7 pounds. -Creatinine bumped slightly from 1.371.41 but fairly stable.  Potassium okay at 3.5 today. -Started on Entresto yesterday and blood pressure is stable today. -Continue Entresto 24-26 mg twice daily, Lopressor 25 mg twice  daily and uptitrate heart failure medicines in the clinic in a few weeks. -Recommend changing Lasix to p.o. today at 40 mg daily. -Encouraged him to be compliant with following a 2 g sodium diet  2.  CAD status post remote CABG -last cath in November 2018 showed an occluded proximal RCA, 90% proximal LAD and 99% proximal left circumflex and a drug-eluting stent was placed to the left circumflex.  He has a known occluded SVG to the RCA and patent LIMA to the LAD by  cath at that time as well. -Denies any chest pain or pressure. -Troponin minimally elevated but likely related to demand ischemia in setting of CHF as well as A. Fib. -Given recent decline in LV function which is likely related to A. fib would recommend outpatient nuclear stress test to rule out ischemia. -He will continue on aspirin 81 mg daily, high-dose statin, Imdur 30 mg daily and beta-blocker.  3.  Hyperlipidemia -LDL was at goal at 51 on 07/08/2018 -He will continue on atorvastatin 80 mg daily, Zetia 10 mg daily and Levoxyl 1 g twice daily.  4.  New onset atrial fibrillation -HR is controlled on Lopressor 25 mg twice daily. -Continue Eliquis 5 mg twice daily -We will plan to see patient back in the office in 1 week to reassess shortness of breath. -We cannot do TEE cardioversion today because of a full schedule an endoscopy.  If the patient is still symptomatic next week when he seen in the office and we will plan outpatient TEE cardioversion.  Otherwise will wait a full 4 weeks of anticoagulation prior to cardioversion.  [facility] will sign off.   Medication Recommendations: Apixaban 5 mg twice daily, aspirin 81 mg daily, Lipitor 80 mg daily, Zetia 10 mg daily, Lasix 40 mg daily, Imdur 30 mg daily, Lopressor 25 mg twice daily and Entresto 24-26 mg twice daily. Other recommendations (labs, testing, etc): Nuclear stress testing as an outpatient Follow up as an outpatient: We will set him up for office visit early next week  to see how he is doing.  For questions or updates, please contact [place] Please consult [email or web address] for contact info under Cardiology/STEMI.      Signed, Fransico Him, MD  11/01/2018, 9:07 AM

## 2018-11-01 NOTE — Discharge Summary (Signed)
Curtis Perez, is a 67 y.o. male  DOB 10-Jan-1951  MRN [id number].  Admission date:  10/30/2018  Admitting Physician  Elwyn Reach, MD  Discharge Date:  11/01/2018   Primary MD  Unk Pinto, MD  Recommendations for primary care physician for things to follow:  -Please check CBC, BMP during next visit -please continue counseling about tobacco cessation -Check LFTs during next visit, if continues to trend down, resume back on atorvastatin  Admission Diagnosis  SOB (shortness of breath) [R06.02]   Discharge Diagnosis  SOB (shortness of breath) [R06.02]   Principal Problem:   Acute respiratory failure with hypoxia ([place]) Active Problems:   Hypertension   Tobacco abuse   Type 2 diabetes mellitus (HCC)   GERD (gastroesophageal reflux disease)   Coronary artery disease involving coronary bypass graft of native heart with unstable angina pectoris (HCC)   Atrial fibrillation (HCC)   Dyspnea on exertion   Acute on chronic systolic heart failure (HCC)   COPD (chronic obstructive pulmonary disease) ([place])   Acute kidney injury ([place])   Elevated transaminase level   AKI (acute kidney injury) ([place])      Past Medical History:  Diagnosis Date  . Acute inferior myocardial infarction ([place]) 10/24/2017  . Adenomatous colon polyp 02/15/2009  . Bradycardia   . CAD (coronary artery disease)    remote MI in 2004 with PCI; s/p CABG x 3 03/2012  . COPD (chronic obstructive pulmonary disease) ([place])   . Depression   . Family history of malignant neoplasm of gastrointestinal tract   . GERD (gastroesophageal reflux disease)   . Hyperlipidemia   . Hypertension   . Hypothyroidism   . Myocardial infarction ([place])    2004  . Obesity   . Prediabetes   . 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 Tobacco abuse 03/28/2012  . Vitamin D deficiency     Past  Surgical History:  Procedure Laterality Date  . BACK SURGERY    . COLONOSCOPY  02/15/2009  . CORONARY ANGIOPLASTY WITH STENT PLACEMENT     about 8 years ago at [facility]  . CORONARY ARTERY BYPASS GRAFT  04/02/2012   Procedure: CORONARY ARTERY BYPASS GRAFTING (CABG);  Surgeon: Rexene Alberts, MD;  Location: [facility];  Service: Open Heart Surgery;  Laterality: N/A;  On pump, times three graphs using endoscopically harvested right greater saphenous vein and left internal mammary artery.   [facility] ACUTE MI REVASCULARIZATION N/A 10/23/2017   Procedure: Coronary/Graft Acute MI Revascularization;  Surgeon: Jettie Booze, MD;  Location: [facility];  Service: Cardiovascular;  Laterality: N/A;  . KNEE SURGERY    . LEFT HEART CATH AND CORS/GRAFTS ANGIOGRAPHY N/A 10/23/2017   Procedure: LEFT HEART CATH AND CORS/GRAFTS ANGIOGRAPHY;  Surgeon: Jettie Booze, MD;  Location: [place];  Service: Cardiovascular;  Laterality: N/A;  . TONSILLECTOMY         History  of present illness and  Hospital Course:     Kindly see H&P for history of present illness and admission details, please review complete Labs, Consult reports and Test reports for all details in brief  HPI  from the history and physical done on the day of admission 10/30/2018 HPI: Curtis Perez is a 67 y.o. male with medical history significant of coronary artery disease status post previous CABG, GERD, hypertension, hyperlipidemia, hypothyroidism with recent diagnosis of atrial fibrillation and previous EF of about 45% from last year Who came to the ER today with exertional dyspnea.  Patient was known to have significant stenosis.  In November of last year he has catheterization revealing 90% stenosis of the circumflex and occlusion of SVG to OM and SVG to RCA.  He had drug-eluting stent placed in the proximal circumflex with distal circumflex found to be 100% stenosis not amenable to balloon angioplasty.  At  that time he did have the echo with akinesis of the inferior lateral and inferior myocardium with grade 2 diastolic dysfunction.  Patient has been on aspirin and Brilinta post cath.  When he was seen last month in the office he did have new onset atrial fibrillation but rate has been controlled.  At that point he reported being out of his Lasix.  Today however he is presenting with exertional dyspnea which has been progressive.  No evidence of overt fluid overload in the ER.  Patient's vitals are generally stable.  Due to his significant history is being admitted for evaluation of exertional dyspnea.  ED Course: Temperature is 97.6 blood pressure 150/94 with pulse 85 respiratory of 22 oxygen sat 83% on room air currently 100% 2 L.  His white count is 6.6 hemoglobin 12.2 and platelets count of 230.  BUN is 28 creatinine 1.55 glucose 139 lactic acid 3.21 and calcium 8.7.  Chest x-ray showed no significant fluid overload.  TSH is also within normal.  EKG showed A. fib with a rate of 91 with no significant changes.  Patient is being admitted for evaluation   Hospital Course  67 year old with history of coronary artery disease status post CABG, GERD, hypertension, hyperlipidemia, hypothyroidism, systolic CHF ejection fraction 45% came to the hospital with complains of exertional dyspnea.  He was initially also noted to be hypoxic saturating 83% on room air.  Upon admission he was started on IV diuresis.  He is very well, no further hypoxia, reports a echo showing drop in EF 30 to 35%, please see discussion below.   Acute respiratory failure with hypoxia  -Due to volume overload from CHF, appropriately diuresed, no further hypoxia, currently on room air  Acute on chronic systolic heart failure.  -Improved, appropriately diuresed, he is -2.1 L over last 24 hours, weight is down 7 pounds, no leg edema, no further volume overload. -Showing EF 30%, beta-blockers, started on Entresto, Lasix dose has been  increased, continue with Imdur, plan for stress test as an outpatient.  persistent atrial fibrillation -On Eliquis and beta-blockers  Hypertension. -Continue with Lopressor, Entresto, Imdur and Lasix  Diabetes mellitus, controlled - Serum glucose 105. A1c 7/19 6.3.  Resume Home meds   COPD.  Current smoker.  He was counseled, not interested to quit smoking Chest Xray with hyperinflation   CAD. S/p CABG. - Cath last year with  90% stenosis of the circumflex and occlusion of SVG to OM and SVG to RCA. S/p drug eluting stent placedin the proximal circumflex with distal circumflex found to be 100%  not amenable to angioplasty. Patient denies chest pain now and on presentation. Troponin slightly elevated but flat. ekg without acute changes -continue home med  gerd stable at baseline  AKI. Creatinine 1.55 on admission. Was 1.19 6 months ago. Likely related to above.  It is 1.4 on discharge -monitor intake and output    Elevated transaminase level.  -Most likely hepatic congestion in the setting of volume overload due to acute and chronic systolic CHF, overall trending down, but still significantly elevated from baseline, so I will hold statin on discharge, it can be resumed as an outpatient if repeat LFTs continues to improve .  likely related to #2. Levels trending down slowly from yesterday. Total bili 1.6 down from 1.9 yesterday    Discharge Condition:   stable  Follow UP  Follow-up Information    Unk Pinto, MD. Go on 11/05/2018.   Specialty:  Internal Medicine Why:  @2 :30pm Contact information: [id number] [place] [address] [phone]             Discharge Instructions  and  Discharge Medications    Discharge Instructions    Discharge instructions   Complete by:  As directed    Follow with Primary MD Unk Pinto, MD in 7 days   Get CBC, CMP,checked  by Primary MD next visit.    Activity: As tolerated with Full  fall precautions use walker/cane & assistance as needed   Disposition Home    Diet: Heart Healthy carbohydrate modified, low-salt diet, with feeding assistance and aspiration precautions.  For Heart failure patients - Check your Weight same time everyday, if you gain over 2 pounds, or you develop in leg swelling, experience more shortness of breath or chest pain, call your Primary MD immediately. Follow Cardiac Low Salt Diet and 1.5 lit/day fluid restriction.   On your next visit with your primary care physician please Get Medicines reviewed and adjusted.   Please request your Prim.MD to go over all Hospital Tests and Procedure/Radiological results at the follow up, please get all Hospital records sent to your Prim MD by signing hospital release before you go home.   If you experience worsening of your admission symptoms, develop shortness of breath, life threatening emergency, suicidal or homicidal thoughts you must seek medical attention immediately by calling 911 or calling your MD immediately  if symptoms less severe.  You Must read complete instructions/literature along with all the possible adverse reactions/side effects for all the Medicines you take and that have been prescribed to you. Take any new Medicines after you have completely understood and accpet all the possible adverse reactions/side effects.   Do not drive, operating heavy machinery, perform activities at heights, swimming or participation in water activities or provide baby sitting services if your were admitted for syncope or siezures until you have seen by Primary MD or a Neurologist and advised to do so again.  Do not drive when taking Pain medications.    Do not take more than prescribed Pain, Sleep and Anxiety Medications  Special Instructions: If you have smoked or chewed Tobacco  in the last 2 yrs please stop smoking, stop any regular Alcohol  and or any Recreational drug use.  Wear Seat belts while  driving.   Please note  You were cared for by a hospitalist during your hospital stay. If you have any questions about your discharge medications or the care you received while you were in the hospital after you are discharged, you can call the unit  and asked to speak with the hospitalist on call if the hospitalist that took care of you is not available. Once you are discharged, your primary care physician will handle any further medical issues. Please note that NO REFILLS for any discharge medications will be authorized once you are discharged, as it is imperative that you return to your primary care physician (or establish a relationship with a primary care physician if you do not have one) for your aftercare needs so that they can reassess your need for medications and monitor your lab values.   Increase activity slowly   Complete by:  As directed      Allergies as of 11/01/2018      Reactions   Lisinopril Cough      Medication List    STOP taking these medications   atorvastatin 80 MG tablet Commonly known as:  LIPITOR   losartan 100 MG tablet Commonly known as:  COZAAR     TAKE these medications   apixaban 5 MG Tabs tablet Commonly known as:  ELIQUIS Take 1 tablet (5 mg total) by mouth 2 (two) times daily.   aspirin 81 MG chewable tablet Chew 1 tablet (81 mg total) daily by mouth.   ezetimibe 10 MG tablet Commonly known as:  ZETIA TAKE 1 TABLET BY MOUTH EVERY DAY   FISH OIL PO Take 2,000 mg by mouth daily.   FLUoxetine 40 MG capsule Commonly known as:  PROZAC TAKE 1 CAPSULE DAILY FOR MOOD What changed:  See the new instructions.   furosemide 40 MG tablet Commonly known as:  LASIX Take 1 tablet (40 mg total) by mouth daily. What changed:    medication strength  how much to take  when to take this  reasons to take this   IRON PO Take 65 mg (of elemental iron) every other day   isosorbide mononitrate 30 MG 24 hr tablet Commonly known as:  IMDUR TAKE 1  TABLET (30 MG TOTAL) DAILY BY MOUTH.   levothyroxine 100 MCG tablet Commonly known as:  SYNTHROID, LEVOTHROID TAKE 1 TABLET (100 MCG TOTAL) BY MOUTH DAILY BEFORE BREAKFAST. What changed:  See the new instructions.   Magnesium 400 MG Caps Take 400 mg by mouth 5 (five) times daily.   metFORMIN 500 MG 24 hr tablet Commonly known as:  GLUCOPHAGE-XR TAKE 1 TABLET WITH BREAKFAST AND LUNCH & 2 TABLETS AT SUPPER FOR DIABETES What changed:  See the new instructions.   metoprolol tartrate 25 MG tablet Commonly known as:  LOPRESSOR TAKE 1 TABLET (25 MG TOTAL) 2 (TWO) TIMES DAILY BY MOUTH.   nitroGLYCERIN 0.4 MG SL tablet Commonly known as:  NITROSTAT Place 1 tablet (0.4 mg total) every 5 (five) minutes as needed under the tongue.   omeprazole 40 MG capsule Commonly known as:  PRILOSEC TAKE 1 CAPSULE DAILY FOR ACID INDIGESTION & REFLUX What changed:  See the new instructions.   potassium chloride SA 20 MEQ tablet Commonly known as:  K-DUR,KLOR-CON Take 20 mEq by mouth daily.   sacubitril-valsartan 24-26 MG Commonly known as:  ENTRESTO Take 1 tablet by mouth 2 (two) times daily.   VITAMIN B-12 PO Take 1,000 mcg by mouth daily.   Vitamin D (Ergocalciferol) 1.25 MG (50000 UT) Caps capsule Commonly known as:  DRISDOL Take 1 capsule (50,000 Units total) by mouth 3 (three) times a week. What changed:  when to take this         Diet and Activity recommendation: See Discharge Instructions above  Consults obtained -  cardiology   Major procedures and Radiology Reports - PLEASE review detailed and final reports for all details, in brief -    Dg Chest 2 View  Result Date: 10/30/2018 CLINICAL DATA:  67 year old male with atrial fibrillation and shortness of breath during exertion for the past 4 days EXAM: CHEST - 2 VIEW COMPARISON:  Prior chest x-ray 10/15/2018 FINDINGS: Stable mild cardiomegaly. Patient is status post median sternotomy with evidence of prior multivessel CABG.  No evidence of pulmonary edema, pneumothorax or pleural effusion. Mild hyperinflation and chronic bronchitic changes are similar compared to prior. IMPRESSION: Stable chest x-ray without evidence of acute cardiopulmonary process. Stable cardiomegaly and background hyperinflation suggesting underlying COPD. Electronically Signed   By: Jacqulynn Cadet M.D.   On: 10/30/2018 16:50   Dg Chest 2 View  Result Date: 10/15/2018 CLINICAL DATA:  Exertional shortness of breath since being off of Lasix for the past week. Abnormal breath sounds in the lungs. New onset of atrial fibrillation. History of CABG, current smoker, diabetes. EXAM: CHEST - 2 VIEW COMPARISON:  PA and lateral chest x-ray of November 16, 2017 FINDINGS: The lungs are well-expanded. The interstitial markings are coarse. There is no alveolar infiltrate or pleural effusion. The heart is normal in size. The pulmonary vascularity is not engorged. There are post CABG changes. There is faint calcification in the wall of the aortic arch. The bony thorax exhibits no acute abnormality. IMPRESSION: COPD.  No alveolar pneumonia nor pulmonary edema.  Previous CABG. Thoracic aortic atherosclerosis. Electronically Signed   By: David  Martinique M.D.   On: 10/15/2018 13:28    Micro Results     No results found for this or any previous visit (from the past 240 hour(s)).   Advisory  Today   Subjective:   Curtis Perez today has no headache,no chest abdominal pain,no new weakness tingling or numbness, feels much better  today.  Dyspnea significantly improved  Objective:   Blood pressure 114/82, pulse 82, temperature 98 F (36.7 C), temperature source Oral, resp. rate 18, height 6\' 3"  (1.905 m), weight 104.9 kg, SpO2 96 %.   Intake/Output Summary (Last 24 hours) at 11/01/2018 1133 Last data filed at 11/01/2018 2263 Gross per 24 hour  Intake 243 ml  Output 1400 ml  Net -1157 ml    Exam Awake Alert, Oriented x 3, No new F.N deficits, Normal  affect Symmetrical Chest wall movement, Good air movement bilaterally, CTAB RRR,No Gallops,Rubs or new Murmurs, No Parasternal Heave +ve B.Sounds, Abd Soft, Non tender,  No rebound -guarding or rigidity. No Cyanosis, Clubbing or edema, No new Rash or bruise  Data Review   CBC w Diff:  Lab Results  Component Value Date   WBC 8.4 10/31/2018   HGB 11.9 (L) 10/31/2018   HGB 12.9 (L) 11/01/2017   HCT 36.0 (L) 10/31/2018   HCT 39.7 11/01/2017   PLT 255 10/31/2018   PLT 342 11/01/2017   LYMPHOPCT 11 10/24/2017   MONOPCT 6.9 07/08/2018   EOSPCT 2.1 07/08/2018   BASOPCT 1.0 07/08/2018    CMP:  Lab Results  Component Value Date   NA 134 (L) 11/01/2018   NA 137 02/11/2018   K 3.5 11/01/2018   CL 95 (L) 11/01/2018   CO2 28 11/01/2018   BUN 24 (H) 11/01/2018   BUN 13 02/11/2018   CREATININE 1.41 (H) 11/01/2018   CREATININE 1.18 07/08/2018   PROT 5.3 (L) 11/01/2018   PROT 6.3 02/11/2018   ALBUMIN 3.0 (L) 11/01/2018  ALBUMIN 4.2 02/11/2018   BILITOT 1.5 (H) 11/01/2018   BILITOT 0.7 02/11/2018   ALKPHOS 127 (H) 11/01/2018   AST 145 (H) 11/01/2018   ALT 344 (H) 11/01/2018  .   Total Time in preparing paper work, data evaluation and todays exam - 44 minutes  Phillips Climes M.D on 11/01/2018 at 11:33 AM  [facility]   Office  [phone]

## 2018-11-05 ENCOUNTER — Encounter: Payer: Self-pay | Admitting: Physician Assistant

## 2018-11-05 ENCOUNTER — Ambulatory Visit: Payer: 59 | Admitting: Physician Assistant

## 2018-11-05 ENCOUNTER — Telehealth: Payer: Self-pay | Admitting: *Deleted

## 2018-11-05 ENCOUNTER — Ambulatory Visit: Payer: Self-pay | Admitting: Adult Health Nurse Practitioner

## 2018-11-05 NOTE — Telephone Encounter (Signed)
Patient has follow up office visit. Knows to call with any questions or concerns before that visit.

## 2018-11-05 NOTE — Telephone Encounter (Signed)
Called patient on 11/05/2018 , 9:15 AM in an attempt to reach the patient for a hospital follow up.   Admit date: 10/30/18 Discharge: 11/01/18   He DOES NOT have any questions or concerns about medications from the hospital admission. The patient's medications were reviewed over the phone, they were counseled to bring in all current medications to the hospital follow up visit.   I advised the patient to call if any questions or concerns arise about the hospital admission or medications    Home health WAS NOT  started in the hospital.  All questions were answered and a follow up appointment was made.   Prior to Admission medications   Medication Sig Start Date End Date Taking? Authorizing Provider  apixaban (ELIQUIS) 5 MG TABS tablet Take 1 tablet (5 mg total) by mouth 2 (two) times daily. 10/16/18   Cheryln Manly, NP  aspirin 81 MG chewable tablet Chew 1 tablet (81 mg total) daily by mouth. 10/27/17   Cheryln Manly, NP  Cyanocobalamin (VITAMIN B-12 PO) Take 1,000 mcg by mouth daily.     [provider]  ezetimibe (ZETIA) 10 MG tablet TAKE 1 TABLET BY MOUTH EVERY DAY Patient taking differently: Take 10 mg by mouth daily.  09/05/18   Unk Pinto, MD  FLUoxetine (PROZAC) 40 MG capsule TAKE 1 CAPSULE DAILY FOR MOOD Patient taking differently: Take 40 mg by mouth daily. FOR MOOD 04/17/18   Unk Pinto, MD  furosemide (LASIX) 40 MG tablet Take 1 tablet (40 mg total) by mouth daily. 11/01/18 11/01/19  Elgergawy, Silver Huguenin, MD  IRON PO Take 65 mg (of elemental iron) every other day    [provider]  isosorbide mononitrate (IMDUR) 30 MG 24 hr tablet TAKE 1 TABLET (30 MG TOTAL) DAILY BY MOUTH. 04/14/18   Unk Pinto, MD  levothyroxine (SYNTHROID, LEVOTHROID) 100 MCG tablet TAKE 1 TABLET (100 MCG TOTAL) BY MOUTH DAILY BEFORE BREAKFAST. Patient taking differently: Take 100 mcg by mouth daily before breakfast.  10/02/18   Unk Pinto, MD  Magnesium 400 MG  CAPS Take 400 mg by mouth 5 (five) times daily.     [provider]  metFORMIN (GLUCOPHAGE-XR) 500 MG 24 hr tablet TAKE 1 TABLET WITH BREAKFAST AND LUNCH & 2 TABLETS AT SUPPER FOR DIABETES Patient taking differently: Take 500-1,000 mg by mouth See admin instructions. Take 1 tablet by mouth with breakfast and lunch and 2 tablets at supper for diabetes. 04/05/18   Vicie Mutters, PA-C  metoprolol tartrate (LOPRESSOR) 25 MG tablet TAKE 1 TABLET (25 MG TOTAL) 2 (TWO) TIMES DAILY BY MOUTH. 06/09/18   Unk Pinto, MD  nitroGLYCERIN (NITROSTAT) 0.4 MG SL tablet Place 1 tablet (0.4 mg total) every 5 (five) minutes as needed under the tongue. 10/26/17   Cheryln Manly, NP  Omega-3 Fatty Acids (FISH OIL PO) Take 2,000 mg by mouth daily.    [provider]  omeprazole (PRILOSEC) 40 MG capsule TAKE 1 CAPSULE DAILY FOR ACID INDIGESTION & REFLUX Patient taking differently: Take 40 mg by mouth daily. For acid Indigestion & Reflux 10/25/18   Unk Pinto, MD  potassium chloride SA (K-DUR,KLOR-CON) 20 MEQ tablet Take 20 mEq by mouth daily.     [provider]  sacubitril-valsartan (ENTRESTO) 24-26 MG Take 1 tablet by mouth 2 (two) times daily. 11/01/18   Elgergawy, Silver Huguenin, MD  Vitamin D, Ergocalciferol, (DRISDOL) 50000 UNITS CAPS capsule Take 1 capsule (50,000 Units total) by mouth 3 (three) times a week. Patient  taking differently: Take 50,000 Units by mouth every Wednesday.  09/23/15   Rolene Course, PA-C

## 2018-11-05 NOTE — Progress Notes (Deleted)
Cardiology Office Note    Date:  11/05/2018   ID:  Curtis, Perez December 08, 1951, MRN [id number]  PCP:  Unk Pinto, MD  Cardiologist:  Dr. Acie Fredrickson  Chief Complaint: Hospital follow up   History of Present Illness:   Curtis Perez is a 67 y.o. male with PMH of CAD, HTN, HL, DM, COPD with tobacco use, depression, GERD and obesity presents for hospital follow up.   He underwent CABG back in 2013. His last cath in November 2018 showed an occluded proximal RCA, 90% proximal LAD and 99% proximal left circumflex and a drug-eluting stent was placed to LCX.Curtis Perez He has a known occluded SVG to the RCA and patent LIMA to the LAD by cath at that time as well. Echo in Nov 2018 showed lvef 40-45%, akinesis and scarring of inferolateral and inferior myocardium, g2dd, severely dilated left atrium, mildly dilated right atrium and moderate to severe MR  Admitted 10/2018 for acute CHF in setting of new onset atrial fibrillation. EF a year ago was 45% but has now decreased to 30 to 35%. Question whether this is related to his recent diagnosis of atrial fibrillation with poorly controlled heart rate. Recommend outpatient nuclear stress test to rule out ischemia. Recommended outpatient cardioversion.   Here today for follow up.            Past Medical History:  Diagnosis Date  . Acute inferior myocardial infarction ([place]) 10/24/2017  . Adenomatous colon polyp 02/15/2009  . Bradycardia   . CAD (coronary artery disease)    remote MI in 2004 with PCI; s/p CABG x 3 03/2012  . COPD (chronic obstructive pulmonary disease) ([place])   . Depression   . Family history of malignant neoplasm of gastrointestinal tract   . GERD (gastroesophageal reflux disease)   . Hyperlipidemia   . Hypertension   . Hypothyroidism   . Myocardial infarction ([place])    2004  . Obesity   . Prediabetes   . 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 Tobacco abuse 03/28/2012  .  Vitamin D deficiency     Past Surgical History:  Procedure Laterality Date  . BACK SURGERY    . COLONOSCOPY  02/15/2009  . CORONARY ANGIOPLASTY WITH STENT PLACEMENT     about 8 years ago at [facility]  . CORONARY ARTERY BYPASS GRAFT  04/02/2012   Procedure: CORONARY ARTERY BYPASS GRAFTING (CABG);  Surgeon: Rexene Alberts, MD;  Location: [facility];  Service: Open Heart Surgery;  Laterality: N/A;  On pump, times three graphs using endoscopically harvested right greater saphenous vein and left internal mammary artery.   [facility] ACUTE MI REVASCULARIZATION N/A 10/23/2017   Procedure: Coronary/Graft Acute MI Revascularization;  Surgeon: Jettie Booze, MD;  Location: [facility];  Service: Cardiovascular;  Laterality: N/A;  . KNEE SURGERY    . LEFT HEART CATH AND CORS/GRAFTS ANGIOGRAPHY N/A 10/23/2017   Procedure: LEFT HEART CATH AND CORS/GRAFTS ANGIOGRAPHY;  Surgeon: Jettie Booze, MD;  Location: [facility];  Service: Cardiovascular;  Laterality: N/A;  . TONSILLECTOMY      Current Medications: Prior to Admission medications   Medication Sig Start Date End Date Taking? Authorizing Provider  apixaban (ELIQUIS) 5 MG TABS tablet Take 1 tablet (5 mg total) by mouth 2 (two) times daily. 10/16/18   Cheryln Manly, NP  aspirin 81 MG chewable tablet Chew  1 tablet (81 mg total) daily by mouth. 10/27/17   Reino Bellis B, NP  Cyanocobalamin (VITAMIN B-12 PO) Take 1,000 mcg by mouth daily.     [provider]  ezetimibe (ZETIA) 10 MG tablet TAKE 1 TABLET BY MOUTH EVERY DAY Patient taking differently: Take 10 mg by mouth daily.  09/05/18   Unk Pinto, MD  FLUoxetine (PROZAC) 40 MG capsule TAKE 1 CAPSULE DAILY FOR MOOD Patient taking differently: Take 40 mg by mouth daily. FOR MOOD 04/17/18   Unk Pinto, MD  furosemide (LASIX) 40 MG tablet Take 1 tablet (40 mg total) by mouth daily. 11/01/18 11/01/19  Elgergawy, Silver Huguenin, MD  IRON PO Take 65 mg  (of elemental iron) every other day    [provider]  isosorbide mononitrate (IMDUR) 30 MG 24 hr tablet TAKE 1 TABLET (30 MG TOTAL) DAILY BY MOUTH. 04/14/18   Unk Pinto, MD  levothyroxine (SYNTHROID, LEVOTHROID) 100 MCG tablet TAKE 1 TABLET (100 MCG TOTAL) BY MOUTH DAILY BEFORE BREAKFAST. Patient taking differently: Take 100 mcg by mouth daily before breakfast.  10/02/18   Unk Pinto, MD  Magnesium 400 MG CAPS Take 400 mg by mouth 5 (five) times daily.     [provider]  metFORMIN (GLUCOPHAGE-XR) 500 MG 24 hr tablet TAKE 1 TABLET WITH BREAKFAST AND LUNCH & 2 TABLETS AT SUPPER FOR DIABETES Patient taking differently: Take 500-1,000 mg by mouth See admin instructions. Take 1 tablet by mouth with breakfast and lunch and 2 tablets at supper for diabetes. 04/05/18   Vicie Mutters, PA-C  metoprolol tartrate (LOPRESSOR) 25 MG tablet TAKE 1 TABLET (25 MG TOTAL) 2 (TWO) TIMES DAILY BY MOUTH. 06/09/18   Unk Pinto, MD  nitroGLYCERIN (NITROSTAT) 0.4 MG SL tablet Place 1 tablet (0.4 mg total) every 5 (five) minutes as needed under the tongue. 10/26/17   Cheryln Manly, NP  Omega-3 Fatty Acids (FISH OIL PO) Take 2,000 mg by mouth daily.    [provider]  omeprazole (PRILOSEC) 40 MG capsule TAKE 1 CAPSULE DAILY FOR ACID INDIGESTION & REFLUX Patient taking differently: Take 40 mg by mouth daily. For acid Indigestion & Reflux 10/25/18   Unk Pinto, MD  potassium chloride SA (K-DUR,KLOR-CON) 20 MEQ tablet Take 20 mEq by mouth daily.     [provider]  sacubitril-valsartan (ENTRESTO) 24-26 MG Take 1 tablet by mouth 2 (two) times daily. 11/01/18   Elgergawy, Silver Huguenin, MD  Vitamin D, Ergocalciferol, (DRISDOL) 50000 UNITS CAPS capsule Take 1 capsule (50,000 Units total) by mouth 3 (three) times a week. Patient taking differently: Take 50,000 Units by mouth every Wednesday.  09/23/15   Rolene Course, PA-C    Allergies:   Lisinopril   Social History    Socioeconomic History  . Marital status: Divorced    Spouse name: Not on file  . Number of children: 0  . Years of education: Not on file  . Highest education level: Not on file  Occupational History  . Occupation: Disability  . Occupation: Counsellor for [employer]  . Financial resource strain: Not very hard  . Food insecurity:    Worry: Never true    Inability: Never true  . Transportation needs:    Medical: No    Non-medical: No  Tobacco Use  . Smoking status: Current Every Day Smoker    Packs/day: 2.00    Years: 40.00    Pack years: 80.00    Types: Cigarettes  . Smokeless tobacco: Never  Used  . Tobacco comment: down from 2ppd  Substance and Sexual Activity  . Alcohol use: No    Alcohol/week: 2.0 standard drinks    Types: 1 Glasses of wine, 1 Shots of liquor per week    Comment: Rarely  . Drug use: No  . Sexual activity: Not Currently  Lifestyle  . Physical activity:    Days per week: Patient refused    Minutes per session: Patient refused  . Stress: Patient refused  Relationships  . Social connections:    Talks on phone: Patient refused    Gets together: Patient refused    Attends religious service: Patient refused    Active member of club or organization: Patient refused    Attends meetings of clubs or organizations: Patient refused    Relationship status: Patient refused  Other Topics Concern  . Not on file  Social History Narrative  . Not on file     Family History:  The patient's family history includes Alzheimer's disease in his mother; Colon cancer in his father and paternal grandmother; Hypertension in his mother. ***  ROS:   Please see the history of present illness.    ROS All other systems reviewed and are negative.   PHYSICAL EXAM:   VS:  There were no vitals taken for this visit.   GEN: Well nourished, well developed, in no acute distress  HEENT: normal  Neck: no JVD, carotid bruits, or masses Cardiac: ***RRR; no  murmurs, rubs, or gallops,no edema  Respiratory:  clear to auscultation bilaterally, normal work of breathing GI: soft, nontender, nondistended, + BS MS: no deformity or atrophy  Skin: warm and dry, no rash Neuro:  Alert and Oriented x 3, Strength and sensation are intact Psych: euthymic mood, full affect  Wt Readings from Last 3 Encounters:  11/01/18 231 lb 3.2 oz (104.9 kg)  10/15/18 247 lb (112 kg)  10/15/18 246 lb 9.6 oz (111.9 kg)      Studies/Labs Reviewed:   EKG:  EKG is ordered today.  The ekg ordered today demonstrates ***  Recent Labs: 10/15/2018: B Natriuretic Peptide 978.3; Magnesium 1.5 10/30/2018: TSH 2.797 10/31/2018: Hemoglobin 11.9; Platelets 255 11/01/2018: ALT 344; BUN 24; Creatinine, Ser 1.41; Potassium 3.5; Sodium 134   Lipid Panel    Component Value Date/Time   CHOL 190 07/08/2018 1711   CHOL 92 (L) 02/11/2018 1011   TRIG 251 (H) 07/08/2018 1711   HDL 51 07/08/2018 1711   HDL 42 02/11/2018 1011   CHOLHDL 3.7 07/08/2018 1711   VLDL 34 10/24/2017 0247   LDLCALC 103 (H) 07/08/2018 1711    Additional studies/ records that were reviewed today include:   Echocardiogram: 10/31/18 Study Conclusions  - Left ventricle: The cavity size was normal. Wall thickness was   increased in a pattern of moderate LVH. Systolic function was   moderately to severely reduced. The estimated ejection fraction   was in the range of 30% to 35%. Diffuse hypokinesis. - Mitral valve: There was moderate regurgitation. - Left atrium: The atrium was moderately dilated. - Right atrium: The atrium was moderately to severely dilated. - Pulmonary arteries: Systolic pressure was moderately increased.   PA peak pressure: 42 mm Hg (S).  Coronary/Graft Acute MI Revascularization  10/2017  LEFT HEART CATH AND CORS/GRAFTS ANGIOGRAPHY  Conclusion     Prox Cx lesion is 99% stenosed. SVG to OM is occluded. Thrombectomy, followed by a drug-eluting stent which was successfully placed  using a STENT SYNERGY DES 3.5X20, postdilated to  3.75.  Post intervention, there is a 0% residual stenosis.  Ost 1st Mrg lesion is 70% stenosed. Balloon angioplasty was performed in the ostial vessel before and after circumflex stent placement, using a BALLOON SAPPHIRE 2.5X12.  Post intervention, there is a 20% residual stenosis.  Dist Cx lesion is 100% stenosed. Balloon angioplasty ws attempted but the lesion was too distal in the vessel.  Prox LAD lesion is 90% stenosed. LIMA to LAD is patent.  Prox RCA lesion is 100% stenosed. SVG to RCA is occluded.  LV end diastolic pressure is mildly elevated.  There is no aortic valve stenosis.   Continue angiomax for another hour.  He will need dual antiplatelet therapy for at least a year, along with aggressive secondary prevention.          ASSESSMENT & PLAN:    1. CAD s/p CABG  2. Chronic systolic CHF - Recent echo showed newly depressed LVEF to 30-35% from 45%. ? Rate related given new dx of afib. Will get lexiscan as recommended in hospital .   3. Atrial fibrillation     Medication Adjustments/Labs and Tests Ordered: Current medicines are reviewed at length with the patient today.  Concerns regarding medicines are outlined above.  Medication changes, Labs and Tests ordered today are listed in the Patient Instructions below. There are no Patient Instructions on file for this visit.   Jarrett Soho, [place]  11/05/2018 9:42 AM    [facility] [place], [place], [place]  [address] Phone: [phone]; Fax: [phone]

## 2018-11-05 NOTE — Progress Notes (Deleted)
Hospital follow up  Assessment and Plan: Hospital visit follow up for exertional shortness of breath, history of coronary artery disease status post CABG, GERD, hypertension, hyperlipidemia, hypothyroidism, systolic CHF ejection fraction 45% came to the hospital.  Also hypoxic 85% room air and started on IV diuresis.  [facility] admission echo showing drop in EF 30 to 35%. During this course atorvastatin and Losartan were discontinued and the patient has stopped these medications as directed.  Hospital discharge meds were reviewed, and reconciled with the patient.    There are no discontinued medications.  Diagnoses and all orders for this visit:  Acute respiratory failure with hypoxia ([place])  Acute on chronic systolic heart failure (HCC)  Chronic obstructive pulmonary disease, unspecified COPD type ([place])  Acute kidney injury ([place])  Elevated transaminase level  Dyspnea on exertion  Persistent atrial fibrillation  Coronary artery disease involving coronary bypass graft of native heart with unstable angina pectoris (HCC)  Gastroesophageal reflux disease, esophagitis presence not specified  Type 2 diabetes mellitus with stage 2 chronic kidney disease, without long-term current use of insulin ([place])  Essential hypertension  Tobacco abuse  Mixed hyperlipidemia    Over 40 minutes of exam, counseling, chart review, and complex, high/moderate level critical decision making was performed this visit.     HPI 67 y.o.male presents for follow up for transition from recent hospitalization . Admit date to the hospital was 10/30/18, patient was discharged from the hospital on 11/01/18 and our clinical staff contacted the office the day after discharge to set up a follow up appointment. The discharge summary, medications, and diagnostic test results were reviewed before meeting with the patient. The patient was admitted for:    Home health {ACTION; IS/IS [id number]} involved.   Images  while in the hospital: Dg Chest 2 View  Result Date: 10/30/2018 CLINICAL DATA:  67 year old male with atrial fibrillation and shortness of breath during exertion for the past 4 days EXAM: CHEST - 2 VIEW COMPARISON:  Prior chest x-ray 10/15/2018 FINDINGS: Stable mild cardiomegaly. Patient is status post median sternotomy with evidence of prior multivessel CABG. No evidence of pulmonary edema, pneumothorax or pleural effusion. Mild hyperinflation and chronic bronchitic changes are similar compared to prior. IMPRESSION: Stable chest x-ray without evidence of acute cardiopulmonary process. Stable cardiomegaly and background hyperinflation suggesting underlying COPD. Electronically Signed   By: Jacqulynn Cadet M.D.   On: 10/30/2018 16:50    Past Medical History:  Diagnosis Date  . Acute inferior myocardial infarction ([place]) 10/24/2017  . Adenomatous colon polyp 02/15/2009  . Bradycardia   . CAD (coronary artery disease)    remote MI in 2004 with PCI; s/p CABG x 3 03/2012  . COPD (chronic obstructive pulmonary disease) ([place])   . Depression   . Family history of malignant neoplasm of gastrointestinal tract   . GERD (gastroesophageal reflux disease)   . Hyperlipidemia   . Hypertension   . Hypothyroidism   . Myocardial infarction ([place])    2004  . Obesity   . Prediabetes   . 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 Tobacco abuse 03/28/2012  . Vitamin D deficiency      Allergies  Allergen Reactions  . Lisinopril Cough      Current Outpatient Medications on File Prior to Visit  Medication Sig Dispense Refill  . apixaban (ELIQUIS) 5 MG TABS tablet Take 1 tablet (5 mg total) by mouth 2 (two)  times daily. 60 tablet 2  . aspirin 81 MG chewable tablet Chew 1 tablet (81 mg total) daily by mouth.    . Cyanocobalamin (VITAMIN B-12 PO) Take 1,000 mcg by mouth daily.     Marland Kitchen ezetimibe (ZETIA) 10 MG tablet TAKE 1 TABLET BY MOUTH EVERY DAY (Patient  taking differently: Take 10 mg by mouth daily. ) 90 tablet 0  . FLUoxetine (PROZAC) 40 MG capsule TAKE 1 CAPSULE DAILY FOR MOOD (Patient taking differently: Take 40 mg by mouth daily. FOR MOOD) 90 capsule 1  . furosemide (LASIX) 40 MG tablet Take 1 tablet (40 mg total) by mouth daily. 30 tablet 0  . IRON PO Take 65 mg (of elemental iron) every other day    . isosorbide mononitrate (IMDUR) 30 MG 24 hr tablet TAKE 1 TABLET (30 MG TOTAL) DAILY BY MOUTH. 90 tablet 3  . levothyroxine (SYNTHROID, LEVOTHROID) 100 MCG tablet TAKE 1 TABLET (100 MCG TOTAL) BY MOUTH DAILY BEFORE BREAKFAST. (Patient taking differently: Take 100 mcg by mouth daily before breakfast. ) 90 tablet 1  . Magnesium 400 MG CAPS Take 400 mg by mouth 5 (five) times daily.     . metFORMIN (GLUCOPHAGE-XR) 500 MG 24 hr tablet TAKE 1 TABLET WITH BREAKFAST AND LUNCH & 2 TABLETS AT SUPPER FOR DIABETES (Patient taking differently: Take 500-1,000 mg by mouth See admin instructions. Take 1 tablet by mouth with breakfast and lunch and 2 tablets at supper for diabetes.) 360 tablet 1  . metoprolol tartrate (LOPRESSOR) 25 MG tablet TAKE 1 TABLET (25 MG TOTAL) 2 (TWO) TIMES DAILY BY MOUTH. 180 tablet 0  . nitroGLYCERIN (NITROSTAT) 0.4 MG SL tablet Place 1 tablet (0.4 mg total) every 5 (five) minutes as needed under the tongue. 25 tablet 2  . Omega-3 Fatty Acids (FISH OIL PO) Take 2,000 mg by mouth daily.    Marland Kitchen omeprazole (PRILOSEC) 40 MG capsule TAKE 1 CAPSULE DAILY FOR ACID INDIGESTION & REFLUX (Patient taking differently: Take 40 mg by mouth daily. For acid Indigestion & Reflux) 30 capsule 0  . potassium chloride SA (K-DUR,KLOR-CON) 20 MEQ tablet Take 20 mEq by mouth daily.     . sacubitril-valsartan (ENTRESTO) 24-26 MG Take 1 tablet by mouth 2 (two) times daily. 60 tablet 0  . Vitamin D, Ergocalciferol, (DRISDOL) 50000 UNITS CAPS capsule Take 1 capsule (50,000 Units total) by mouth 3 (three) times a week. (Patient taking differently: Take 50,000 Units  by mouth every Wednesday. ) 30 capsule 3   No current facility-administered medications on file prior to visit.     ROS: all negative except above.   Physical Exam: There were no vitals filed for this visit. There were no vitals taken for this visit. General Appearance: Well nourished, in no apparent distress. Eyes: PERRLA, EOMs, conjunctiva no swelling or erythema Sinuses: No Frontal/maxillary tenderness ENT/Mouth: Ext aud canals clear, TMs without erythema, bulging. No erythema, swelling, or exudate on post pharynx.  Tonsils not swollen or erythematous. Hearing normal.  Neck: Supple, thyroid normal.  Respiratory: Respiratory effort normal, BS equal bilaterally without rales, rhonchi, wheezing or stridor.  Cardio: RRR with no MRGs. Brisk peripheral pulses without edema.  Abdomen: Soft, + BS.  Non tender, no guarding, rebound, hernias, masses. Lymphatics: Non tender without lymphadenopathy.  Musculoskeletal: Full ROM, 5/5 strength, normal gait.  Skin: Warm, dry without rashes, lesions, ecchymosis.  Neuro: Cranial nerves intact. Normal muscle tone, no cerebellar symptoms. Sensation intact.  Psych: Awake and oriented X 3, normal affect, Insight and Judgment  appropriate.     Garnet Sierras, NP 7:51 AM [facility]

## 2018-11-06 ENCOUNTER — Encounter: Payer: Self-pay | Admitting: Cardiovascular Disease

## 2018-11-06 ENCOUNTER — Ambulatory Visit: Payer: 59 | Admitting: Cardiovascular Disease

## 2018-11-06 ENCOUNTER — Ambulatory Visit: Payer: Self-pay | Admitting: Physician Assistant

## 2018-11-06 DIAGNOSIS — R0989 Other specified symptoms and signs involving the circulatory and respiratory systems: Secondary | ICD-10-CM

## 2018-11-23 ENCOUNTER — Other Ambulatory Visit: Payer: Self-pay | Admitting: Physician Assistant

## 2018-11-25 ENCOUNTER — Other Ambulatory Visit: Payer: Self-pay | Admitting: Internal Medicine

## 2018-11-26 ENCOUNTER — Other Ambulatory Visit: Payer: Self-pay | Admitting: Internal Medicine

## 2018-11-26 DIAGNOSIS — I10 Essential (primary) hypertension: Secondary | ICD-10-CM

## 2018-11-26 DIAGNOSIS — I509 Heart failure, unspecified: Secondary | ICD-10-CM

## 2018-11-26 MED ORDER — FUROSEMIDE 40 MG PO TABS: ORAL_TABLET | ORAL | 3 refills | Status: DC

## 2018-11-27 ENCOUNTER — Encounter (HOSPITAL_COMMUNITY): Payer: Self-pay | Admitting: Emergency Medicine

## 2018-11-27 ENCOUNTER — Emergency Department (HOSPITAL_COMMUNITY)
Admission: EM | Admit: 2018-11-27 | Discharge: 2018-11-27 | Disposition: A | Discharge status: Home / Self Care | Payer: 59 | Attending: Emergency Medicine | Admitting: Emergency Medicine

## 2018-11-27 ENCOUNTER — Emergency Department (HOSPITAL_COMMUNITY): Payer: 59

## 2018-11-27 DIAGNOSIS — R05 Cough: Secondary | ICD-10-CM | POA: Diagnosis not present

## 2018-11-27 DIAGNOSIS — E039 Hypothyroidism, unspecified: Secondary | ICD-10-CM | POA: Insufficient documentation

## 2018-11-27 DIAGNOSIS — Z79899 Other long term (current) drug therapy: Secondary | ICD-10-CM | POA: Insufficient documentation

## 2018-11-27 DIAGNOSIS — I5023 Acute on chronic systolic (congestive) heart failure: Secondary | ICD-10-CM | POA: Diagnosis not present

## 2018-11-27 DIAGNOSIS — Z7984 Long term (current) use of oral hypoglycemic drugs: Secondary | ICD-10-CM | POA: Diagnosis not present

## 2018-11-27 DIAGNOSIS — F17210 Nicotine dependence, cigarettes, uncomplicated: Secondary | ICD-10-CM | POA: Diagnosis not present

## 2018-11-27 DIAGNOSIS — I2510 Atherosclerotic heart disease of native coronary artery without angina pectoris: Secondary | ICD-10-CM | POA: Diagnosis not present

## 2018-11-27 DIAGNOSIS — R600 Localized edema: Secondary | ICD-10-CM | POA: Diagnosis not present

## 2018-11-27 DIAGNOSIS — Z955 Presence of coronary angioplasty implant and graft: Secondary | ICD-10-CM | POA: Insufficient documentation

## 2018-11-27 DIAGNOSIS — Z7901 Long term (current) use of anticoagulants: Secondary | ICD-10-CM | POA: Diagnosis not present

## 2018-11-27 DIAGNOSIS — Z7982 Long term (current) use of aspirin: Secondary | ICD-10-CM | POA: Diagnosis not present

## 2018-11-27 DIAGNOSIS — I10 Essential (primary) hypertension: Secondary | ICD-10-CM | POA: Diagnosis not present

## 2018-11-27 DIAGNOSIS — I5042 Chronic combined systolic (congestive) and diastolic (congestive) heart failure: Secondary | ICD-10-CM | POA: Diagnosis not present

## 2018-11-27 DIAGNOSIS — E119 Type 2 diabetes mellitus without complications: Secondary | ICD-10-CM | POA: Insufficient documentation

## 2018-11-27 DIAGNOSIS — I4891 Unspecified atrial fibrillation: Secondary | ICD-10-CM | POA: Diagnosis not present

## 2018-11-27 DIAGNOSIS — Z951 Presence of aortocoronary bypass graft: Secondary | ICD-10-CM | POA: Insufficient documentation

## 2018-11-27 DIAGNOSIS — I110 Hypertensive heart disease with heart failure: Secondary | ICD-10-CM | POA: Insufficient documentation

## 2018-11-27 DIAGNOSIS — J449 Chronic obstructive pulmonary disease, unspecified: Secondary | ICD-10-CM | POA: Insufficient documentation

## 2018-11-27 DIAGNOSIS — R0602 Shortness of breath: Secondary | ICD-10-CM | POA: Diagnosis not present

## 2018-11-27 HISTORY — DX: Heart failure, unspecified: I50.9

## 2018-11-27 LAB — CBC
HCT: 40.4 % (ref 39.0–52.0)
Hemoglobin: 12.6 g/dL — ABNORMAL LOW (ref 13.0–17.0)
MCH: 27.3 pg (ref 26.0–34.0)
MCHC: 31.2 g/dL (ref 30.0–36.0)
MCV: 87.4 fL (ref 80.0–100.0)
Platelets: 272 10*3/uL (ref 150–400)
RBC: 4.62 MIL/uL (ref 4.22–5.81)
RDW: 15.9 % — ABNORMAL HIGH (ref 11.5–15.5)
WBC: 7.8 10*3/uL (ref 4.0–10.5)
nRBC: 0 % (ref 0.0–0.2)

## 2018-11-27 LAB — COMPREHENSIVE METABOLIC PANEL
ALT: 255 U/L — ABNORMAL HIGH (ref 0–44)
AST: 266 U/L — ABNORMAL HIGH (ref 15–41)
Albumin: 3.5 g/dL (ref 3.5–5.0)
Alkaline Phosphatase: 127 U/L — ABNORMAL HIGH (ref 38–126)
Anion gap: 16 — ABNORMAL HIGH (ref 5–15)
BUN: 23 mg/dL (ref 8–23)
CO2: 24 mmol/L (ref 22–32)
Calcium: 8.9 mg/dL (ref 8.9–10.3)
Chloride: 95 mmol/L — ABNORMAL LOW (ref 98–111)
Creatinine, Ser: 1.78 mg/dL — ABNORMAL HIGH (ref 0.61–1.24)
GFR calc Af Amer: 46 mL/min — ABNORMAL LOW (ref >60)
GFR calc non Af Amer: 39 mL/min — ABNORMAL LOW (ref >60)
Glucose, Bld: 113 mg/dL — ABNORMAL HIGH (ref 70–99)
Potassium: 4 mmol/L (ref 3.5–5.1)
Sodium: 135 mmol/L (ref 135–145)
Total Bilirubin: 1.5 mg/dL — ABNORMAL HIGH (ref 0.3–1.2)
Total Protein: 6.1 g/dL — ABNORMAL LOW (ref 6.5–8.1)

## 2018-11-27 LAB — TROPONIN I: Troponin I: 0.05 ng/mL (ref <0.03)

## 2018-11-27 MED ORDER — FUROSEMIDE 10 MG/ML IJ SOLN
60.0000 mg | Freq: Once | INTRAMUSCULAR | Status: AC
  Administered 2018-11-27: 60 mg via INTRAVENOUS
  Filled 2018-11-27: qty 6

## 2018-11-27 NOTE — ED Triage Notes (Signed)
Pt arrives via EMS from home with exertional SOB and weakness. Pt has hx of CHF and believes he has gained about 10 pounds in the week. Pt endorses a non-productive cough that seems to have gotten worse in the past 10 days.

## 2018-11-27 NOTE — ED Notes (Signed)
Pt verbalized understanding of discharge paperwork and follow-up care.  °

## 2018-11-27 NOTE — Discharge Instructions (Addendum)
Increase your lasix to 40 mg TWICE A DAY for the next 3 days  Return to the ER for new or worsening symptoms

## 2018-11-27 NOTE — ED Provider Notes (Signed)
[facility] EMERGENCY DEPARTMENT Provider Note   CSN: [id number] Arrival date & time: 11/27/18  0741     History   Chief Complaint Chief Complaint  Patient presents with  . Shortness of Breath    HPI Curtis Perez is a 67 y.o. male.  HPI Patient is a 67 year old male with a history of congestive heart failure who presents the emergency department with worsening exertional shortness of breath over the past 10 days.  Mild cough.  10 pound weight gain over the past week.  Compliant with his 40 mg of daily Lasix.  Last ejection fraction was noted to be 30 to 35%.  Recently diagnosed with atrial fibrillation on anticoagulation and rate controlled.  Primary cardiologist is Dr. Acie Fredrickson.  Denies dietary indiscretion.  Symptoms are mild to moderate in severity.   Past Medical History:  Diagnosis Date  . Acute inferior myocardial infarction ([place]) 10/24/2017  . Adenomatous colon polyp 02/15/2009  . Bradycardia   . CAD (coronary artery disease)    remote MI in 2004 with PCI; s/p CABG x 3 03/2012  . CHF (congestive heart failure) ([place])   . COPD (chronic obstructive pulmonary disease) ([place])   . Depression   . Family history of malignant neoplasm of gastrointestinal tract   . GERD (gastroesophageal reflux disease)   . Hyperlipidemia   . Hypertension   . Hypothyroidism   . Myocardial infarction ([place])    2004  . Obesity   . Prediabetes   . 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 Tobacco abuse 03/28/2012  . Vitamin D deficiency     Patient Active Problem List   Diagnosis Date Noted  . Acute respiratory failure with hypoxia ([place]) 10/31/2018  . Acute on chronic systolic heart failure ([place]) 10/31/2018  . Acute kidney injury ([place]) 10/31/2018  . Elevated transaminase level 10/31/2018  . COPD (chronic obstructive pulmonary disease) ([place])   . Dyspnea on exertion 10/30/2018  . Atrial fibrillation ([place]) 10/15/2018  .  Hypothyroidism 11/18/2017  . Chronic combined systolic and diastolic CHF (congestive heart failure) ([place]) 11/18/2017  . Tobacco abuse counseling   . Coronary artery disease involving coronary bypass graft of native heart with unstable angina pectoris ([place])   . History of MI (myocardial infarction) 05/20/2017  . Rectus sheath hematoma 08/08/2014  . Medication management 03/30/2014  . CAD (coronary artery disease) 12/25/2013  . Type 2 diabetes mellitus ([place])   . GERD (gastroesophageal reflux disease)   . Vitamin D deficiency   . Obesity (BMI 30.0-34.9)   . 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 Hypertension 03/28/2012  . Tobacco abuse 03/28/2012  . HLD (hyperlipidemia) 03/28/2012  . COLONIC POLYPS, ADENOMATOUS, HX OF 02/19/2009    Past Surgical History:  Procedure Laterality Date  . BACK SURGERY    . COLONOSCOPY  02/15/2009  . CORONARY ANGIOPLASTY WITH STENT PLACEMENT     about 8 years ago at [facility]  . CORONARY ARTERY BYPASS GRAFT  04/02/2012   Procedure: CORONARY ARTERY BYPASS GRAFTING (CABG);  Surgeon: Rexene Alberts, MD;  Location: [place] [facility];  Service: Open Heart Surgery;  Laterality: N/A;  On pump, times three graphs using endoscopically harvested right greater saphenous vein and left internal mammary artery.   [place] ACUTE MI REVASCULARIZATION N/A 10/23/2017   Procedure: Coronary/Graft Acute MI Revascularization;  Surgeon: Jettie Booze, MD;  Location: [facility];  Service: Cardiovascular;  Laterality: N/A;  . KNEE SURGERY    . LEFT HEART CATH AND CORS/GRAFTS ANGIOGRAPHY N/A 10/23/2017   Procedure: LEFT HEART CATH AND CORS/GRAFTS ANGIOGRAPHY;  Surgeon: Jettie Booze, MD;  Location: [facility];  Service: Cardiovascular;  Laterality: N/A;  . TONSILLECTOMY          Home Medications    Prior to Admission medications   Medication Sig Start Date End Date Taking? Authorizing Provider  apixaban (ELIQUIS) 5 MG TABS tablet Take 1 tablet (5 mg total) by  mouth 2 (two) times daily. 10/16/18  Yes Cheryln Manly, NP  aspirin 81 MG chewable tablet Chew 1 tablet (81 mg total) daily by mouth. 10/27/17  Yes Reino Bellis B, NP  Cyanocobalamin (VITAMIN B-12 PO) Take 1,000 mcg by mouth daily.    Yes [provider]  ezetimibe (ZETIA) 10 MG tablet TAKE 1 TABLET BY MOUTH EVERY DAY Patient taking differently: Take 10 mg by mouth daily.  09/05/18  Yes Unk Pinto, MD  FLUoxetine (PROZAC) 40 MG capsule TAKE 1 CAPSULE DAILY FOR MOOD Patient taking differently: Take 40 mg by mouth daily. TAKE 1 CAPSULE DAILY FOR MOOD 11/25/18  Yes Unk Pinto, MD  furosemide (LASIX) 40 MG tablet Take 1 tablet daily for BP & Fluid 11/26/18  Yes Unk Pinto, MD  IRON PO Take 65 mg (of elemental iron) every other day   Yes [provider]  isosorbide mononitrate (IMDUR) 30 MG 24 hr tablet TAKE 1 TABLET (30 MG TOTAL) DAILY BY MOUTH. 04/14/18  Yes Unk Pinto, MD  levothyroxine (SYNTHROID, LEVOTHROID) 100 MCG tablet TAKE 1 TABLET (100 MCG TOTAL) BY MOUTH DAILY BEFORE BREAKFAST. Patient taking differently: Take 100 mcg by mouth daily before breakfast.  10/02/18  Yes Unk Pinto, MD  Magnesium 500 MG TABS Take 500 mg by mouth 4 (four) times daily.    Yes [provider]  metFORMIN (GLUCOPHAGE-XR) 500 MG 24 hr tablet TAKE 1 TABLET WITH BREAKFAST AND LUNCH & 2 TABLETS AT SUPPER FOR DIABETES Patient taking differently: Take 500-1,000 mg by mouth See admin instructions. Taking 1 tablet (500mg ) at breakfast and lunch and 2 tablets (1000mg ) at supper 11/23/18  Yes Unk Pinto, MD  metoprolol tartrate (LOPRESSOR) 25 MG tablet Take 1 tablet 2 x /day for BP & Heart 11/25/18  Yes Unk Pinto, MD  nitroGLYCERIN (NITROSTAT) 0.4 MG SL tablet Place 1 tablet (0.4 mg total) every 5 (five) minutes as needed under the tongue. 10/26/17  Yes Cheryln Manly, NP  Omega-3 Fatty Acids (FISH OIL PO) Take 2,000 mg by mouth daily.   Yes [provider]  omeprazole (PRILOSEC) 40 MG capsule TAKE 1 CAPSULE DAILY FOR ACID INDIGESTION & REFLUX Patient taking differently: Take 40 mg by mouth daily. For acid Indigestion & Reflux 10/25/18  Yes Unk Pinto, MD  potassium chloride SA (K-DUR,KLOR-CON) 20 MEQ tablet Take 20 mEq by mouth daily.    Yes [provider]  sacubitril-valsartan (ENTRESTO) 24-26 MG Take 1 tablet by mouth 2 (two) times daily. 11/01/18  Yes Elgergawy, Silver Huguenin, MD  Vitamin D, Ergocalciferol, (DRISDOL) 50000 UNITS CAPS capsule Take 1 capsule (50,000 Units total) by mouth 3 (three) times a week. Patient taking differently: Take 50,000 Units by mouth every Wednesday.  09/23/15  Yes Rolene Course, PA-C    Family History Family History  Problem Relation Age of Onset  . Hypertension Mother   . Alzheimer's disease Mother   . Colon cancer Father   .  Colon cancer Paternal Grandmother   . Esophageal cancer Neg Hx   . Rectal cancer Neg Hx   . Stomach cancer Neg Hx     Social History Social History   Tobacco Use  . Smoking status: Current Every Day Smoker    Packs/day: 2.00    Years: 40.00    Pack years: 80.00    Types: Cigarettes  . Smokeless tobacco: Never Used  . Tobacco comment: down from 2ppd  Substance Use Topics  . Alcohol use: No    Alcohol/week: 2.0 standard drinks    Types: 1 Glasses of wine, 1 Shots of liquor per week    Comment: Rarely  . Drug use: No     Allergies   Lisinopril   Review of Systems Review of Systems  All other systems reviewed and are negative.    Physical Exam Updated Vital Signs BP (!) 137/91   Pulse 84   Temp (!) 97.2 F (36.2 C) (Oral)   Resp 19   Ht 6\' 3"  (1.905 m)   Wt 108 kg   SpO2 99%   BMI 29.75 kg/m   Physical Exam  Constitutional: He is oriented to person, place, and time. He appears well-developed and well-nourished.  HENT:  Head: Normocephalic and atraumatic.  Eyes: EOM are normal.  Neck: Normal range of motion.    Cardiovascular: Normal rate, regular rhythm, normal heart sounds and intact distal pulses.  Pulmonary/Chest: Effort normal and breath sounds normal. No respiratory distress.  Abdominal: Soft. He exhibits no distension. There is no tenderness.  Musculoskeletal: Normal range of motion.       Right lower leg: He exhibits edema.       Left lower leg: He exhibits edema.  Neurological: He is alert and oriented to person, place, and time.  Skin: Skin is warm and dry.  Psychiatric: He has a normal mood and affect. Judgment normal.  Nursing note and vitals reviewed.    ED Treatments / Results  Labs (all labs ordered are listed, but only abnormal results are displayed) Labs Reviewed  CBC - Abnormal; Notable for the following components:      Result Value   Hemoglobin 12.6 (*)    RDW 15.9 (*)    All other components within normal limits  COMPREHENSIVE METABOLIC PANEL - Abnormal; Notable for the following components:   Chloride 95 (*)    Glucose, Bld 113 (*)    Creatinine, Ser 1.78 (*)    Total Protein 6.1 (*)    AST 266 (*)    ALT 255 (*)    Alkaline Phosphatase 127 (*)    Total Bilirubin 1.5 (*)    GFR calc non Af Amer 39 (*)    GFR calc Af Amer 46 (*)    Anion gap 16 (*)    All other components within normal limits  TROPONIN I - Abnormal; Notable for the following components:   Troponin I 0.05 (*)    All other components within normal limits    EKG EKG Interpretation  Date/Time:  Wednesday November 27 2018 07:48:46 EST Ventricular Rate:  98 PR Interval:    QRS Duration: 108 QT Interval:  370 QTC Calculation: 473 R Axis:   113 Text Interpretation:  Atrial fibrillation Inferior infarct, age indeterminate Lateral leads are also involved No significant change was found Confirmed by Jola Schmidt [phone]) on 11/27/2018 9:30:54 AM   Radiology Dg Chest 2 View  Result Date: 11/27/2018 CLINICAL DATA:  Shortness of Breath EXAM: CHEST -  2 VIEW COMPARISON:  10/30/2018 FINDINGS:  Cardiac shadow is stable. Postsurgical changes are again seen. The lungs are well aerated bilaterally. No focal infiltrate is seen. No sizable effusion is noted. No bony abnormality is seen. IMPRESSION: No acute abnormality noted. Electronically Signed   By: Inez Catalina M.D.   On: 11/27/2018 08:46    Procedures Procedures (including critical care time)  Medications Ordered in ED Medications  furosemide (LASIX) injection 60 mg (60 mg Intravenous Given 11/27/18 0833)     Initial Impression / Assessment and Plan / ED Course  I have reviewed the triage vital signs and the nursing notes.  Pertinent labs & imaging results that were available during my care of the patient were reviewed by me and considered in my medical decision making (see chart for details).     Trace edema bilaterally.  Patient with mild congestive heart failure exacerbation.  Vital signs are otherwise stable.  Patient given IV dose of Lasix in the emergency department feels much better at this time.  He is urinated almost a liter and a half at this time.  He feels improved.  Will increase his Lasix from 40 mg once a day to 40 mg twice a day for the next 3 days.  He will follow-up closely with his cardiology team.  He understands to weigh himself daily and log this.  Patient is encouraged to return the emergency department for any new or worsening symptoms.  At this time I do not think he needs additional work-up in the emergency department nor does he need hospitalization at this time.  Patient is agreeable to outpatient plan.  Final Clinical Impressions(s) / ED Diagnoses   Final diagnoses:  Acute on chronic systolic congestive heart failure [facility])    ED Discharge Orders    None       Jola Schmidt, MD 11/27/18 [phone]

## 2018-12-19 ENCOUNTER — Other Ambulatory Visit: Payer: Self-pay | Admitting: Internal Medicine

## 2018-12-19 DIAGNOSIS — K219 Gastro-esophageal reflux disease without esophagitis: Secondary | ICD-10-CM

## 2018-12-23 ENCOUNTER — Other Ambulatory Visit: Payer: Self-pay | Admitting: Nurse Practitioner

## 2018-12-23 MED ORDER — SACUBITRIL-VALSARTAN 24-26 MG PO TABS: 1.0000 | ORAL_TABLET | Freq: Two times a day (BID) | ORAL | 0 refills | Status: DC

## 2018-12-24 ENCOUNTER — Telehealth: Payer: Self-pay

## 2018-12-24 NOTE — Telephone Encounter (Signed)
**Note De-Identified Curtis Perez Obfuscation** I have done a [employer] [place] through covermymeds. Key: [id number]

## 2018-12-25 NOTE — Telephone Encounter (Signed)
**Note De-Identified Sherrill Buikema Obfuscation** Approval letter received from [place] on the pts [place]. Approval good until 12/25/2019  I have notified the pts pharmacy of this approval.

## 2019-01-11 NOTE — Progress Notes (Signed)
FOLLOW UP  Assessment and Plan:   A. Fib Rate controlled Continue xarelto; no concerns with excess bleeding or falls Reminded to present to ED for any falls or trauma Follow up with Cardiology - he needs to schedule follow up with Dr. Acie Fredrickson  CHF Weights stable on home scale, no sx of fluid overload Emphasized salt restriction, less than 2000mg  a day. Encouraged daily monitoring of the patient's weight, call office if 5 lb weight loss or gain in a day.  Encouraged regular exercise. If any increasing shortness of breath, swelling, or chest pressure go to ER immediately.  decrease your fluid intake to less than 2 L daily please remember to always increase your potassium intake with any increase of your fluid pill.   Hypertension STOP losartan (restarted in error after hospitalization) continue other medications Monitor blood pressure at home; patient to call if consistently greater than 130/80 Continue DASH diet.   Reminder to go to the ER if any CP, SOB, nausea, dizziness, severe HA, changes vision/speech, left arm numbness and tingling and jaw pain.  Cholesterol Currently above goal; off of atorvastatin 80 mg due to elevated LFTs in hospital - hold off until labs result, restart if normalized; consider switch to rosuvastatin 40 mg   Continue low cholesterol diet and exercise.  Check lipid panel.   Diabetes with diabetic chronic kidney disease Continue medication: metformin Continue diet and exercise.  Perform daily foot/skin check, notify office of any concerning changes.  Check A1C  Hypothyroidism continue medications the same pending lab results reminded to take on an empty stomach 30-45mins before food.  check TSH level  Obesity with co morbidities Long discussion about weight loss, diet, and exercise Recommended diet heavy in fruits and veggies and low in animal meats, cheeses, and dairy products, appropriate calorie intake Discussed ideal weight for height Will  follow up in 3 months  Vitamin D Def At goal at last visit; continue supplementation to maintain goal of 70-100 Defer Vit D level  Tobacco use Discussed risks associated with tobacco use and advised to reduce or quit Patient is not ready to do so, but advised to consider strongly Will follow up at the next visit  Continue diet and meds as discussed. Further disposition pending results of labs. Discussed med's effects and SE's.   Over 30 minutes of exam, counseling, chart review, and critical decision making was performed.   Future Appointments  Date Time Provider [place]  05/05/2019  3:30 PM Unk Pinto, MD [id number] None    ----------------------------------------------------------------------------------------------------------------------  HPI 68 y.o. male  presents for 3 month follow up on CAD, CHF, hypertension, cholesterol, T2 diabetes, hypothyroid, obesity and vitamin D deficiency.   He has hx of ACS in 2005 and underwent PCA/stent; repeat ACS in 2013 and had CABG. Recently in 10/2017 he had MI with 99% circ with proximal DES placed by Dr. Acie Fredrickson; he had done well since until recently when he was found to be newly in a. Fib and was sent to the ER; he spontaneously converted back to NSR, was initiated on elequis; unfortunately he experienced CHF exacerbation in 10/30/2018 and was readmitted - ECHO on 10/31/2018 showed moderate LVH, Systolic CHF with The estimated EF of 30-35% with moderate MVR, moderately increased systolic pressure. Despite medication adjustments, he again presented to ED on 11/27/2018 with yet another exacerbation but was successfully diuresed by IV lasix in ER and discharged for close follow up with cardiology Dr. Acie Fredrickson though admits he has not scheduled this.  He missed several attempts for hospital visit follow ups by our office during this time and this is his first follow up since initial discovery of new onset a. Fib.   He continues with  lopressor 25 mg BID, entresto 24/36 mg BID, imdur 30 mg daily, lasix 40 mg daily, with plan to increase to BID x 3 days when needed for exacerbations. He is also taking losartan 161 mg - duplicating ARB, this was not on discharge meds, anticipate this was automatically refilled and he should not be taking.   he unfortunately currently continues to smoke but has cut down from 2 packs to 1.5 packs a day; discussed risks associated with smoking, patient is not willing to quit, declines medications to assist. He reports he has no desire to quit as he didn't expect to live this long.   BMI is Body mass index is 30.12 kg/m., he has been working on diet , he does not intentionally exercise. He is watching portions.   He has a history of Combined Systolic and Diastolic CHF (08/6044 ECHO EF 30-35%), denies orthopnea, paroxysmal nocturnal dyspnea and edema. Positive for exertional dyspnea. He reports he does check weights at home, reports weights have been stable within a 2-3 lb range.  Wt Readings from Last 3 Encounters:  01/13/19 241 lb (109.3 kg)  11/27/18 238 lb (108 kg)  11/01/18 231 lb 3.2 oz (104.9 kg)   His blood pressure has been controlled at home, today their BP is BP: 93/61   He does not workout. He denies chest pain, he does endorse exertional dyspnea, and some dizziness   He is on cholesterol medication (zetia 10 mg daily, off of lipitor 80 mg due to elevated LFTs in hospital during 10/2018 admission) and denies myalgias. His cholesterol is not at goal. The cholesterol last visit was:   Lab Results  Component Value Date   CHOL 190 07/08/2018   HDL 51 07/08/2018   LDLCALC 103 (H) 07/08/2018   TRIG 251 (H) 07/08/2018   CHOLHDL 3.7 07/08/2018    He has been working on diet for T2DM on metformin 500 mg x 1 tabs, and denies foot ulcerations, increased appetite, nausea, paresthesia of the feet, polydipsia, polyuria, visual disturbances and vomiting. He does not check sugars at home. Last A1C  in the office was:  Lab Results  Component Value Date   HGBA1C 6.3 (H) 11/01/2018   He is on thyroid medication. His medication was not changed last visit.   Lab Results  Component Value Date   TSH 2.797 10/30/2018   Patient is on Vitamin D supplement.   Lab Results  Component Value Date   VD25OH 79 03/25/2018        Current Medications:  Current Outpatient Medications on File Prior to Visit  Medication Sig  . apixaban (ELIQUIS) 5 MG TABS tablet Take 1 tablet (5 mg total) by mouth 2 (two) times daily.  Mar[place] Kitchen aspirin 81 MG chewable tablet Chew 1 tablet (81 mg total) daily by mouth.  . Cyanocobalamin (VITAMIN B-12 PO) Take 1,000 mcg by mouth daily.   Marland Kitchen ezetimibe (ZETIA) 10 MG tablet TAKE 1 TABLET BY MOUTH EVERY DAY (Patient taking differently: Take 10 mg by mouth daily. )  . FLUoxetine (PROZAC) 40 MG capsule TAKE 1 CAPSULE DAILY FOR MOOD (Patient taking differently: Take 40 mg by mouth daily. TAKE 1 CAPSULE DAILY FOR MOOD)  . furosemide (LASIX) 40 MG tablet Take 1 tablet daily for BP & Fluid  . IRON PO  Take 65 mg (of elemental iron) every other day  . isosorbide mononitrate (IMDUR) 30 MG 24 hr tablet TAKE 1 TABLET (30 MG TOTAL) DAILY BY MOUTH.  Marland Kitchen levothyroxine (SYNTHROID, LEVOTHROID) 100 MCG tablet TAKE 1 TABLET (100 MCG TOTAL) BY MOUTH DAILY BEFORE BREAKFAST. (Patient taking differently: Take 100 mcg by mouth daily before breakfast. )  . Magnesium 500 MG TABS Take 500 mg by mouth 4 (four) times daily.   . metFORMIN (GLUCOPHAGE-XR) 500 MG 24 hr tablet TAKE 1 TABLET WITH BREAKFAST AND LUNCH & 2 TABLETS AT SUPPER FOR DIABETES (Patient taking differently: Take 500-1,000 mg by mouth See admin instructions. Taking 1 tablet (500mg ) at breakfast and lunch and 2 tablets (1000mg ) at supper)  . metoprolol tartrate (LOPRESSOR) 25 MG tablet Take 1 tablet 2 x /day for BP & Heart  . nitroGLYCERIN (NITROSTAT) 0.4 MG SL tablet Place 1 tablet (0.4 mg total) every 5 (five) minutes as needed under the  tongue.  . Omega-3 Fatty Acids (FISH OIL PO) Take 2,000 mg by mouth daily.  Marland Kitchen omeprazole (PRILOSEC) 40 MG capsule TAKE 1 CAPSULE DAILY FOR ACID INDIGESTION & REFLUX  . potassium chloride SA (K-DUR,KLOR-CON) 20 MEQ tablet Take 20 mEq by mouth daily.   . sacubitril-valsartan (ENTRESTO) 24-26 MG Take 1 tablet by mouth 2 (two) times daily.  . Vitamin D, Ergocalciferol, (DRISDOL) 50000 UNITS CAPS capsule Take 1 capsule (50,000 Units total) by mouth 3 (three) times a week. (Patient taking differently: Take 50,000 Units by mouth every Wednesday. )   No current facility-administered medications on file prior to visit.      Allergies:  Allergies  Allergen Reactions  . Lisinopril Cough     Medical History:  Past Medical History:  Diagnosis Date  . Acute inferior myocardial infarction ([place]) 10/24/2017  . Adenomatous colon polyp 02/15/2009  . Bradycardia   . CAD (coronary artery disease)    remote MI in 2004 with PCI; s/p CABG x 3 03/2012  . CHF (congestive heart failure) ([place])   . COPD (chronic obstructive pulmonary disease) ([place])   . Depression   . Family history of malignant neoplasm of gastrointestinal tract   . GERD (gastroesophageal reflux disease)   . Hyperlipidemia   . Hypertension   . Hypothyroidism   . Myocardial infarction ([place])    2004  . Obesity   . Prediabetes   . 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 Tobacco abuse 03/28/2012  . Vitamin D deficiency    Family history- Reviewed and unchanged Social history- Reviewed and unchanged   Review of Systems:  Review of Systems  Constitutional: Negative for malaise/fatigue and weight loss.  HENT: Negative for hearing loss and tinnitus.   Eyes: Negative for blurred vision and double vision.  Respiratory: Negative for cough, shortness of breath and wheezing.   Cardiovascular: Negative for chest pain, palpitations, orthopnea, claudication and leg swelling.  Gastrointestinal:  Negative for abdominal pain, blood in stool, constipation, diarrhea, heartburn, melena, nausea and vomiting.  Genitourinary: Negative.   Musculoskeletal: Negative for joint pain and myalgias.  Skin: Negative for rash.  Neurological: Positive for dizziness (with standing). Negative for tingling, sensory change, weakness and headaches.  Endo/Heme/Allergies: Negative for polydipsia.  Psychiatric/Behavioral: Negative.   All other systems reviewed and are negative.   Physical Exam: BP 93/61   Pulse (!) 51   Temp (!) 97.5 F (36.4 C)   Ht 6\' 3"  (  1.905 m)   Wt 241 lb (109.3 kg)   SpO2 97%   BMI 30.12 kg/m  Wt Readings from Last 3 Encounters:  01/13/19 241 lb (109.3 kg)  11/27/18 238 lb (108 kg)  11/01/18 231 lb 3.2 oz (104.9 kg)   General Appearance: Well nourished, in no apparent distress. Eyes: PERRLA, EOMs, conjunctiva no swelling or erythema Sinuses: No Frontal/maxillary tenderness ENT/Mouth: Ext aud canals clear, TMs without erythema, bulging. No erythema, swelling, or exudate on post pharynx.  Tonsils not swollen or erythematous. Hearing normal. Very poor dentition with missing teeth.  Neck: Supple, thyroid normal.  Respiratory: Respiratory effort normal, BS equal bilaterally without rales, rhonchi, wheezing or stridor.  Cardio: RRR with no MRGs. Brisk peripheral pulses without edema.  Abdomen: Soft, + BS.  Non tender, no guarding, rebound, hernias, masses. Lymphatics: Non tender without lymphadenopathy.  Musculoskeletal: Full ROM, 5/5 strength, Normal gait Skin: Warm, dry without rashes, lesions, ecchymosis.  Neuro: Cranial nerves intact. No cerebellar symptoms.  Psych: Awake and oriented X 3, normal affect, Insight and Judgment appropriate.    Izora Ribas, NP 4:41 PM [facility]

## 2019-01-13 ENCOUNTER — Encounter: Payer: Self-pay | Admitting: Adult Health

## 2019-01-13 ENCOUNTER — Ambulatory Visit (INDEPENDENT_AMBULATORY_CARE_PROVIDER_SITE_OTHER): Payer: 59 | Admitting: Adult Health

## 2019-01-13 VITALS — BP 93/61 | HR 51 | Temp 97.5°F | Ht 75.0 in | Wt 241.0 lb

## 2019-01-13 DIAGNOSIS — E66811 Obesity, class 1: Secondary | ICD-10-CM

## 2019-01-13 DIAGNOSIS — I5042 Chronic combined systolic (congestive) and diastolic (congestive) heart failure: Secondary | ICD-10-CM | POA: Diagnosis not present

## 2019-01-13 DIAGNOSIS — E669 Obesity, unspecified: Secondary | ICD-10-CM

## 2019-01-13 DIAGNOSIS — I25700 Atherosclerosis of coronary artery bypass graft(s), unspecified, with unstable angina pectoris: Secondary | ICD-10-CM

## 2019-01-13 DIAGNOSIS — Z720 Tobacco use: Secondary | ICD-10-CM

## 2019-01-13 DIAGNOSIS — I4819 Other persistent atrial fibrillation: Secondary | ICD-10-CM | POA: Diagnosis not present

## 2019-01-13 DIAGNOSIS — Z79899 Other long term (current) drug therapy: Secondary | ICD-10-CM

## 2019-01-13 DIAGNOSIS — E1122 Type 2 diabetes mellitus with diabetic chronic kidney disease: Secondary | ICD-10-CM | POA: Diagnosis not present

## 2019-01-13 DIAGNOSIS — R740 Nonspecific elevation of levels of transaminase and lactic acid dehydrogenase [LDH]: Secondary | ICD-10-CM

## 2019-01-13 DIAGNOSIS — N182 Chronic kidney disease, stage 2 (mild): Secondary | ICD-10-CM

## 2019-01-13 DIAGNOSIS — I10 Essential (primary) hypertension: Secondary | ICD-10-CM

## 2019-01-13 DIAGNOSIS — E039 Hypothyroidism, unspecified: Secondary | ICD-10-CM

## 2019-01-13 DIAGNOSIS — K219 Gastro-esophageal reflux disease without esophagitis: Secondary | ICD-10-CM

## 2019-01-13 DIAGNOSIS — E559 Vitamin D deficiency, unspecified: Secondary | ICD-10-CM

## 2019-01-13 DIAGNOSIS — E782 Mixed hyperlipidemia: Secondary | ICD-10-CM

## 2019-01-13 DIAGNOSIS — R7401 Elevation of levels of liver transaminase levels: Secondary | ICD-10-CM

## 2019-01-13 NOTE — Patient Instructions (Addendum)
Schedule an appointment with Dr. Acie Fredrickson  STOP losartan - this is duplicated medication that you shouldn't be on   Hold off of lipitor/atorvastatin until we tell you to restart due to your liver    Living With Heart Failure  Heart failure is a long-term (chronic) condition in which the heart cannot pump enough blood through the body. When this happens, parts of the body do not get the blood and oxygen they need. There is no cure for heart failure at this time, so it is important for you to take good care of yourself and follow the treatment plan set by your health care provider. If you are living with heart failure, there are ways to help you manage the disease. Follow these instructions at home: Living with heart failure requires you to make changes in your life. Your health care team will teach you about the changes you need to make in order to relieve your symptoms and lower your risk of going to the hospital. Follow the treatment plan as set by your health care provider. Medicines Medicines are important in reducing your heart's workload, slowing the progression of heart failure, and improving your symptoms.  Take over-the-counter and prescription medicines only as told by your health care provider.  Do not stop taking your medicine unless your health care provider tells you to do that.  Do not skip any dose of your medicine.  Refill prescriptions before you run out of medicine. You need your medicines every day. Eating and drinking   Eat heart-healthy foods. Talk with a dietitian to make an eating plan that is right for you. ? If directed by your health care provider: ? Limit salt (sodium). Lowering your sodium intake may reduce symptoms of heart failure. Ask a dietitian to recommend heart-healthy seasonings. ? Limit your fluid intake. Fluid restriction may reduce symptoms of heart failure. ? Use low-fat cooking methods instead of frying. Low-fat methods include roasting,  grilling, broiling, baking, poaching, steaming, and stir-frying. ? Choose foods that contain no trans fat and are low in saturated fat and cholesterol. Healthy choices include fresh or frozen fruits and vegetables, fish, lean meats, legumes, fat-free or low-fat dairy products, and whole-grain or high-fiber foods.  Limit alcohol intake to no more than 1 drink a day for nonpregnant women and 2 drinks a day for men. One drink equals 12 oz of beer, 5 oz of wine, or 1 oz of hard liquor. ? Drinking more than that is harmful to your heart. Tell your health care provider if you drink alcohol several times a week. ? Talk with your health care provider about whether any level of alcohol use is safe for you. Activity   Ask your health care provider about attending cardiac rehabilitation. These programs include aerobic physical activity, which provides many benefits for your heart.  If no cardiac rehabilitation program is available, ask your health care provider what aerobic exercises are safe for you to do. Lifestyle Make the lifestyle changes recommended by your health care provider. In general:  Lose weight if your health care provider tells you to do that. Weight loss may reduce symptoms of heart failure.  Do not use any products that contain nicotine or tobacco, such as cigarettes or e-cigarettes. If you need help quitting, ask your health care provider.  Do not use street (illegal) drugs.  Return to your normal activities as told by your health care provider. Ask your health care provider what activities are safe for you. General  instructions   Make sure you weigh yourself every day to track your weight. Rapid weight gain may indicate an increase in fluid in your body and may increase the workload of your heart. ? Weigh yourself every morning. Do this after you urinate but before you eat breakfast. ? Wear the same type of clothing, without shoes, each time you weigh yourself. ? Weigh yourself  on the same scale and in the same spot each time.  Living with chronic heart failure often leads to emotions such as fear, stress, anxiety, and depression. If you feel any of these emotions and need help coping, contact your health care provider. Other ways to get help include: ? Talking to friends and family members about your condition. They can give you support and guidance. Explain your symptoms to them and, if comfortable, invite them to attend appointments or rehabilitation with you. ? Joining a support group for people with chronic heart failure. Talking with other people who have the same symptoms may give you new ways of coping with your disease and your emotions.  Stay up to date with your shots (vaccines). Staying current on pneumococcal and influenza vaccines is especially important in preventing germs from attacking your airways (respiratory infections).  Keep all follow-up visits as told by your health care provider. This is important. How to recognize changes in your condition You and your family members need to know what changes to watch for in your condition. Watch for the following changes and report them to your health care provider:  Sudden weight gain. Ask your health care provider what amount of weight gain to report.  Shortness of breath: ? Feeling short of breath while at rest, with no exercise or activity that required great effort. ? Feeling breathless with activity.  Swelling of your lower legs or ankles.  Difficulty sleeping: ? You wake up feeling short of breath. ? You have to use more pillows to raise your head in order to sleep.  Frequent, dry, hacking cough.  Loss of appetite.  Feeling more tired all the time.  Depression or feelings of sadness or hopelessness.  Bloating in the stomach. Where to find more information  Local support groups. Ask your health care provider about groups near you.  The American Heart Association: www.heart.org Contact  a health care provider if:  You have a rapid weight gain.  You have increasing shortness of breath that is unusual for you.  You are unable to participate in your usual physical activities.  You tire easily.  You cough more than normal, especially with physical activity.  You have any swelling or more swelling in areas such as your hands, feet, ankles, or abdomen.  You feel like your heart is beating quickly (palpitations).  You become dizzy or light-headed when you stand up. Get help right away if:  You have difficulty breathing.  You notice or your family notices a change in your awareness, such as having trouble staying awake or having difficulty with concentration.  You have pain or discomfort in your chest.  You have an episode of fainting (syncope). Summary  There is no cure for heart failure, so it is important for you to take good care of yourself and follow the treatment plan set by your health care provider.  Medicines are important in reducing your heart's workload, slowing the progression of heart failure, and improving your symptoms.  Living with chronic heart failure often leads to emotions such as fear, stress, anxiety, and depression. If  you are feeling any of these emotions and need help coping, contact your health care provider. This information is not intended to replace advice given to you by your health care provider. Make sure you discuss any questions you have with your health care provider. Document Released: 04/18/2017 Document Revised: 04/18/2017 Document Reviewed: 04/18/2017 Elsevier Interactive Patient Education  2019 [employer].

## 2019-01-14 ENCOUNTER — Other Ambulatory Visit: Payer: Self-pay | Admitting: Adult Health

## 2019-01-14 DIAGNOSIS — R7989 Other specified abnormal findings of blood chemistry: Secondary | ICD-10-CM

## 2019-01-14 DIAGNOSIS — R945 Abnormal results of liver function studies: Principal | ICD-10-CM

## 2019-01-14 LAB — CBC WITH DIFFERENTIAL/PLATELET
Absolute Monocytes: 330 cells/uL (ref 200–950)
Basophils Absolute: 50 cells/uL (ref 0–200)
Basophils Relative: 1 %
Eosinophils Absolute: 120 cells/uL (ref 15–500)
Eosinophils Relative: 2.4 %
HCT: 44.2 % (ref 38.5–50.0)
Hemoglobin: 14.3 g/dL (ref 13.2–17.1)
Lymphs Abs: 955 cells/uL (ref 850–3900)
MCH: 27.3 pg (ref 27.0–33.0)
MCHC: 32.4 g/dL (ref 32.0–36.0)
MCV: 84.5 fL (ref 80.0–100.0)
MPV: 11.9 fL (ref 7.5–12.5)
Monocytes Relative: 6.6 %
Neutro Abs: 3545 cells/uL (ref 1500–7800)
Neutrophils Relative %: 70.9 %
Platelets: 250 10*3/uL (ref 140–400)
RBC: 5.23 10*6/uL (ref 4.20–5.80)
RDW: 15 % (ref 11.0–15.0)
Total Lymphocyte: 19.1 %
WBC: 5 10*3/uL (ref 3.8–10.8)

## 2019-01-14 LAB — MAGNESIUM: Magnesium: 1.9 mg/dL (ref 1.5–2.5)

## 2019-01-14 LAB — COMPLETE METABOLIC PANEL WITH GFR
AG Ratio: 1.8 (calc) (ref 1.0–2.5)
ALT: 6 U/L — ABNORMAL LOW (ref 9–46)
AST: 10 U/L (ref 10–35)
Albumin: 4.1 g/dL (ref 3.6–5.1)
Alkaline phosphatase (APISO): 78 U/L (ref 40–115)
BUN/Creatinine Ratio: 12 (calc) (ref 6–22)
BUN: 16 mg/dL (ref 7–25)
CO2: 31 mmol/L (ref 20–32)
Calcium: 9.5 mg/dL (ref 8.6–10.3)
Chloride: 97 mmol/L — ABNORMAL LOW (ref 98–110)
Creat: 1.35 mg/dL — ABNORMAL HIGH (ref 0.70–1.25)
GFR, Est African American: 63 mL/min/{1.73_m2} (ref >=60)
GFR, Est Non African American: 55 mL/min/{1.73_m2} — ABNORMAL LOW (ref >=60)
Globulin: 2.3 g/dL (calc) (ref 1.9–3.7)
Glucose, Bld: 113 mg/dL — ABNORMAL HIGH (ref 65–99)
Potassium: 4.5 mmol/L (ref 3.5–5.3)
Sodium: 137 mmol/L (ref 135–146)
Total Bilirubin: 0.7 mg/dL (ref 0.2–1.2)
Total Protein: 6.4 g/dL (ref 6.1–8.1)

## 2019-01-14 LAB — LIPID PANEL
Cholesterol: 216 mg/dL — ABNORMAL HIGH (ref <200)
HDL: 49 mg/dL (ref >40)
LDL Cholesterol (Calc): 137 mg/dL (calc) — ABNORMAL HIGH
Non-HDL Cholesterol (Calc): 167 mg/dL (calc) — ABNORMAL HIGH (ref <130)
Total CHOL/HDL Ratio: 4.4 (calc) (ref <5.0)
Triglycerides: 165 mg/dL — ABNORMAL HIGH (ref <150)

## 2019-01-14 LAB — HEMOGLOBIN A1C
Hgb A1c MFr Bld: 6.6 % of total Hgb — ABNORMAL HIGH (ref <5.7)
Mean Plasma Glucose: 143 (calc)
eAG (mmol/L): 7.9 (calc)

## 2019-01-14 LAB — TSH: TSH: 0.42 mIU/L (ref 0.40–4.50)

## 2019-01-14 MED ORDER — ROSUVASTATIN CALCIUM 40 MG PO TABS: 40.0000 mg | ORAL_TABLET | Freq: Every day | ORAL | 1 refills | Status: DC

## 2019-01-20 ENCOUNTER — Other Ambulatory Visit: Payer: Self-pay | Admitting: Cardiovascular Disease

## 2019-02-05 IMAGING — DX DG CHEST 2V
2 series · 2 of 2 positions shown · non-contrast
Comparison: 10/30/2018

CLINICAL DATA: Shortness of Breath

EXAM:
CHEST - 2 VIEW

[w chest pa]
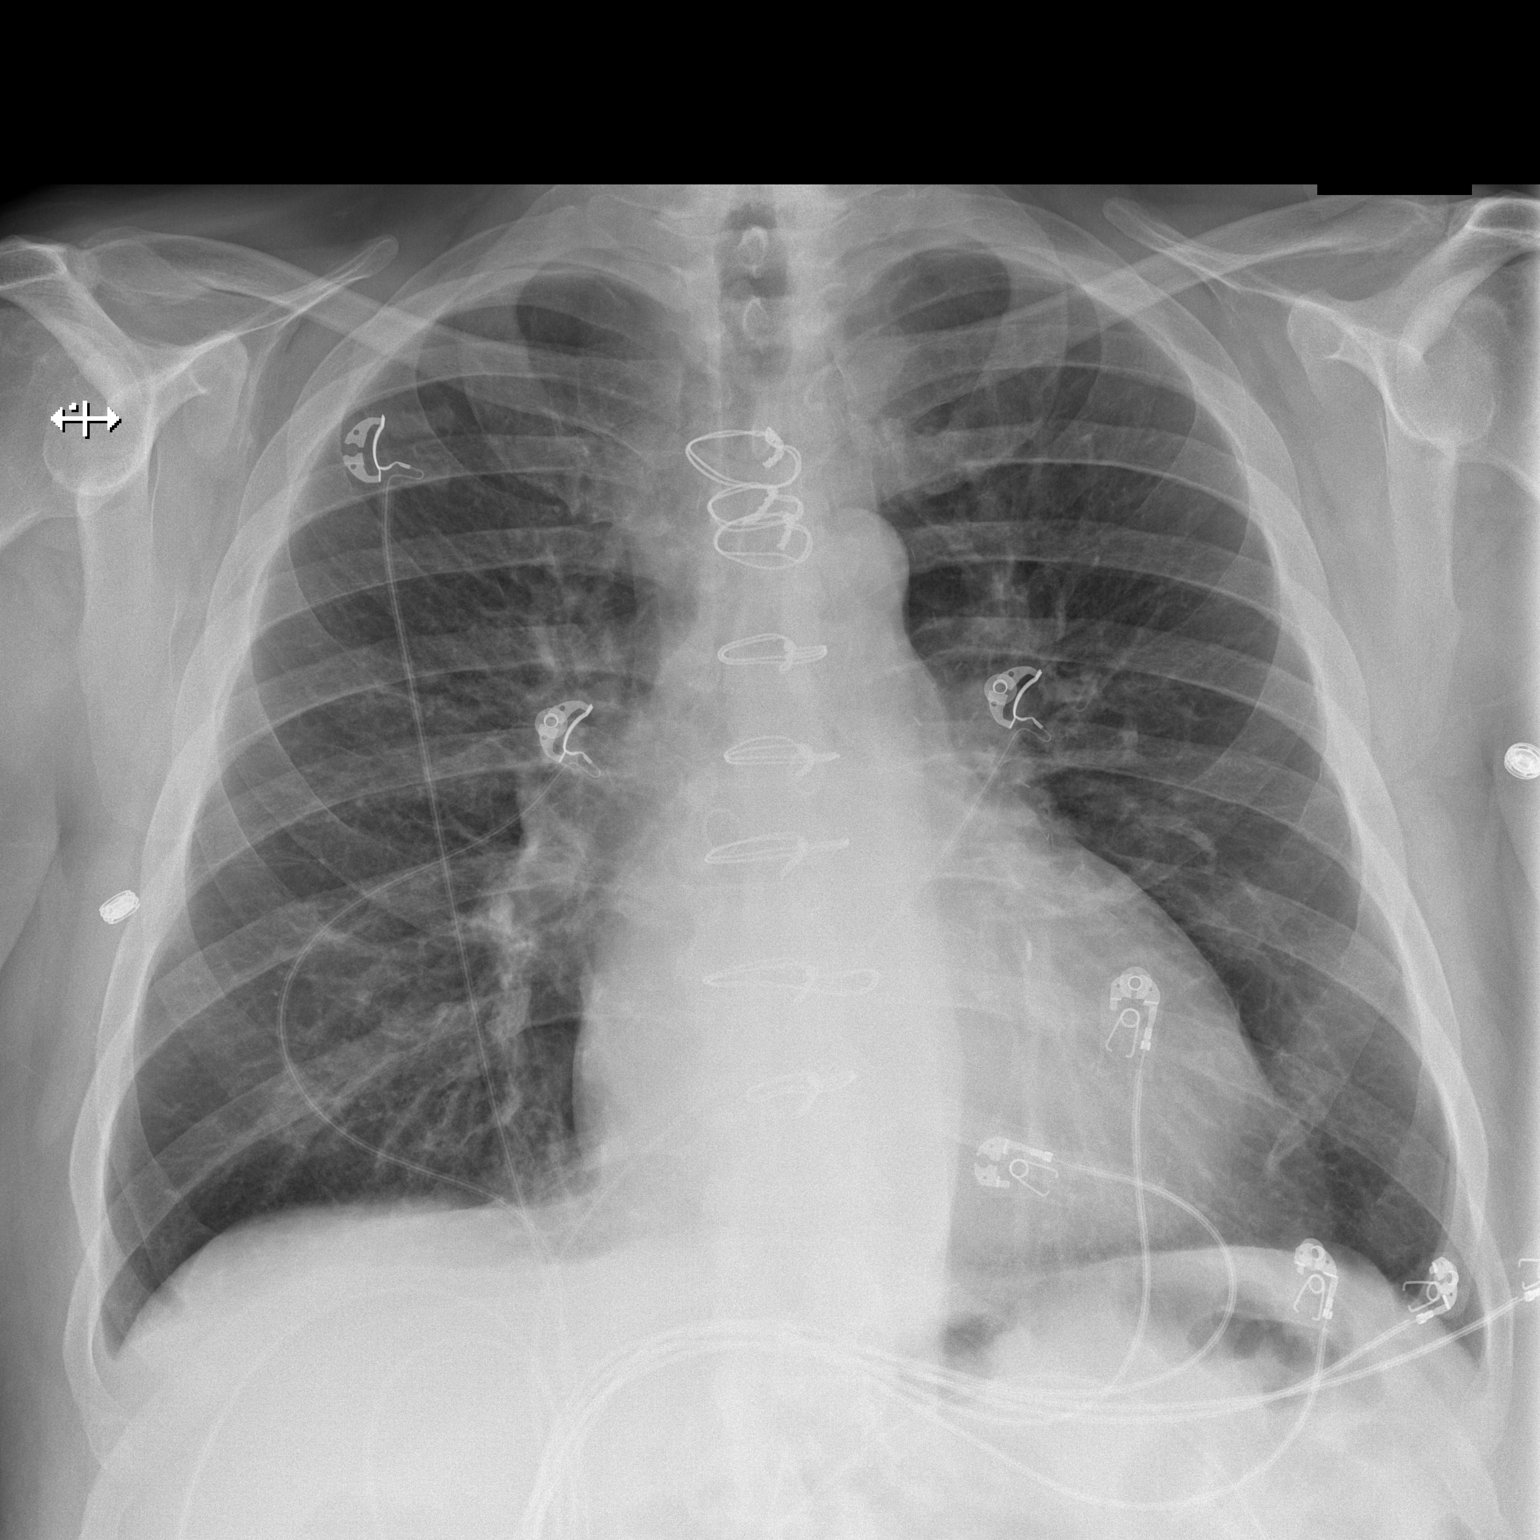

[w chest lat]
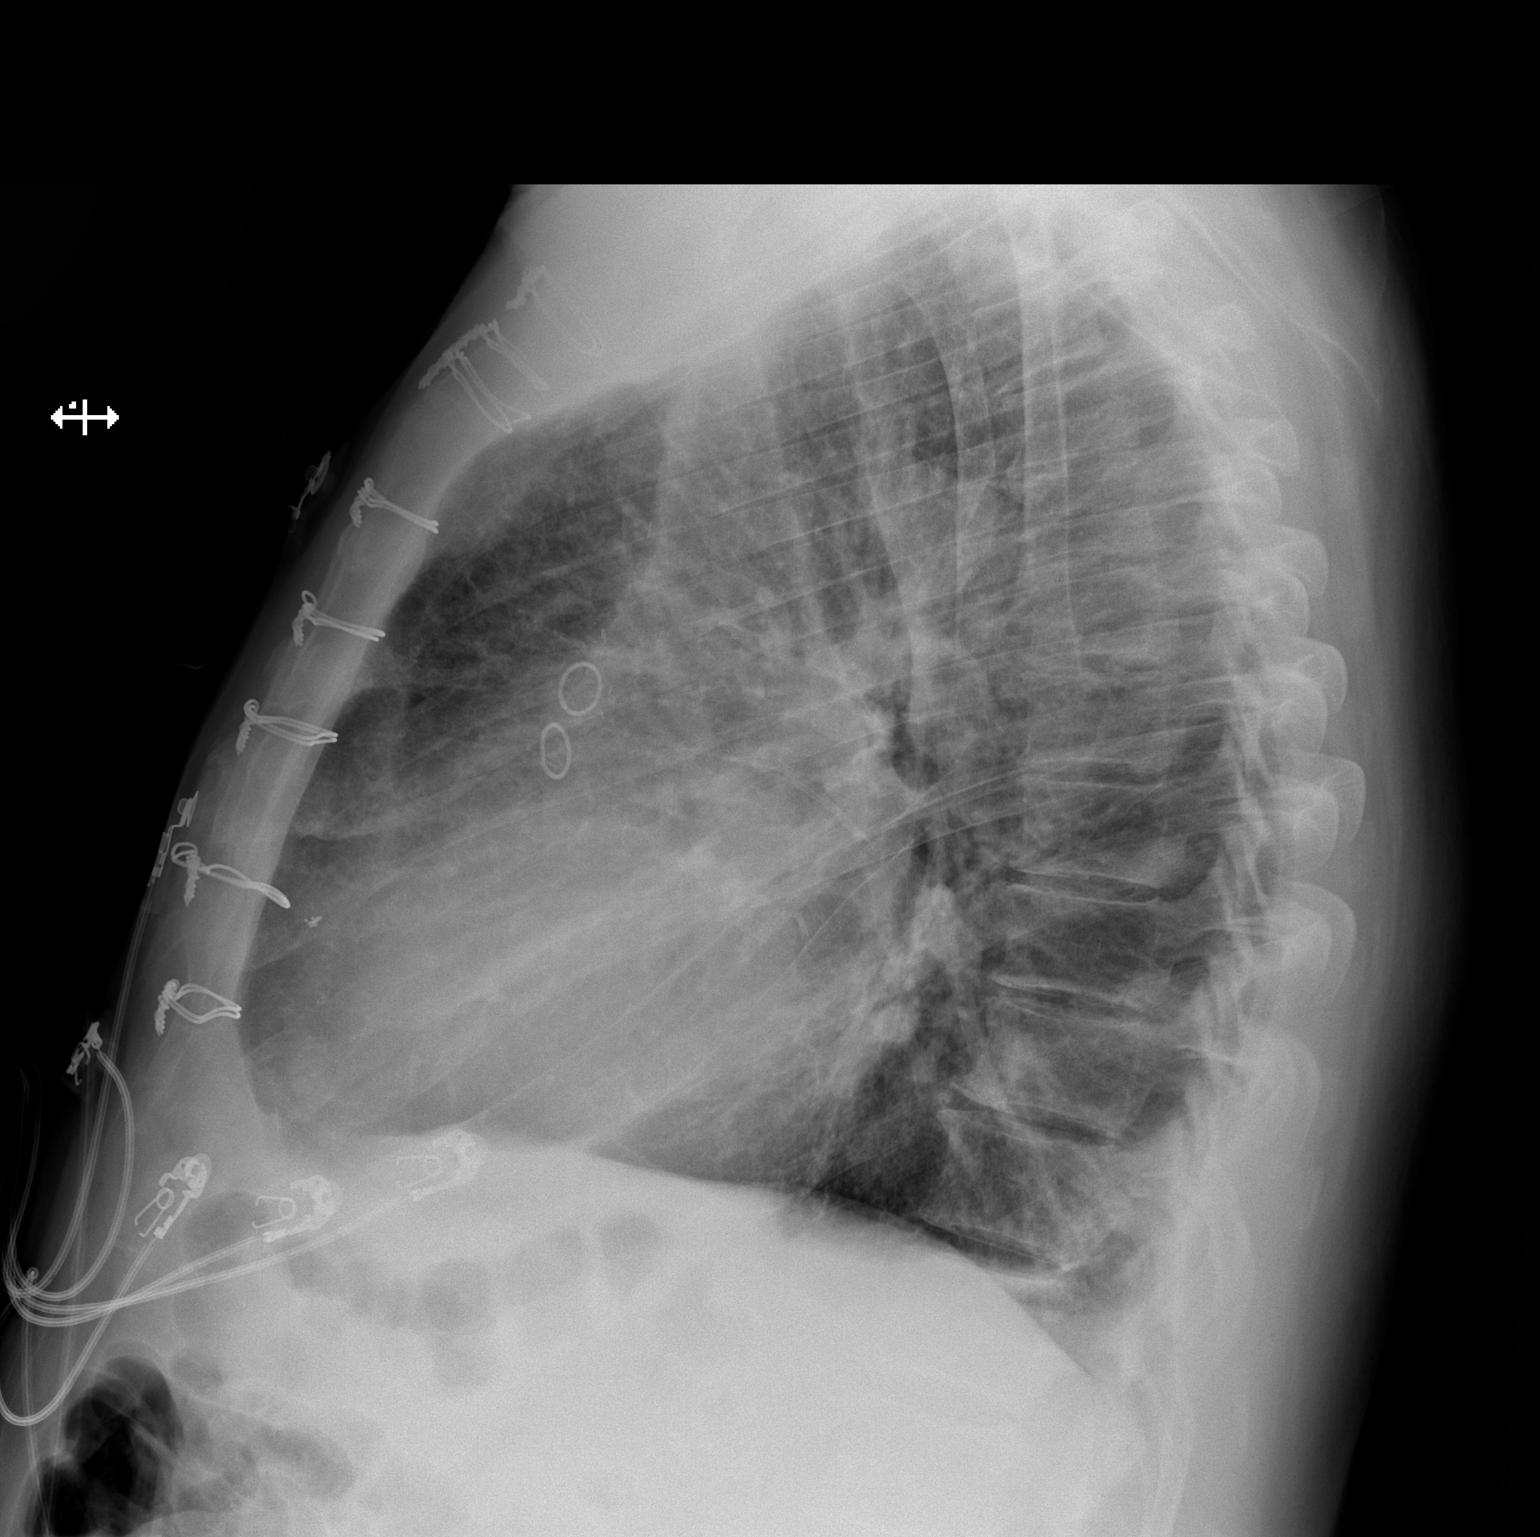

[2 of 2 positions shown; findings below may reference images not displayed]

FINDINGS: Cardiac shadow is stable. Postsurgical changes are again seen. The
lungs are well aerated bilaterally. No focal infiltrate is seen. No
sizable effusion is noted. No bony abnormality is seen.
IMPRESSION: No acute abnormality noted.

## 2019-02-19 ENCOUNTER — Other Ambulatory Visit: Payer: Self-pay | Admitting: Internal Medicine

## 2019-02-19 DIAGNOSIS — E785 Hyperlipidemia, unspecified: Secondary | ICD-10-CM

## 2019-03-27 ENCOUNTER — Other Ambulatory Visit: Payer: Self-pay | Admitting: Cardiology

## 2019-03-27 NOTE — Telephone Encounter (Signed)
68 yo, 109.3kg, Scr 1.35 on 01/13/19 Last OV on 02/11/18  - will refill for now given COVID. Message sent to scheduling pool to make apt when able Indication afib

## 2019-04-22 ENCOUNTER — Other Ambulatory Visit: Payer: Self-pay | Admitting: Internal Medicine

## 2019-05-04 ENCOUNTER — Encounter: Payer: Self-pay | Admitting: Internal Medicine

## 2019-05-04 NOTE — Progress Notes (Signed)
History of Present Illness:      This very nice 68 y.o.  single WM presents for 6 month follow up with HTN, ASCAD,  HLD, Pre-Diabetes and Vitamin D Deficiency.       Patient is treated for HTN & BP has been controlled at home. Today's BP is at upper limits of goal - 128/88.  In 2005, he presented with ACS and had a Stent implanted.  In 2013, he underwent CABG and did well until Nov 2018 when  he had PCA/DES and was treated for CHF.   In Oct 2019, he was dx'd with cAfib and was transitioned from [place] to Eliquis.  Patient was last hospitalized with CHF exacerbation (EF 30-35%) in Dec 2019. His primary Cardiologist is Dr Cathie Olden.  Patient has had no complaints of any cardiac type chest pain, palpitations, dyspnea / orthopnea / PND, dizziness, claudication, or dependent edema. He continues to smoke 2 PPD.      Hyperlipidemia is controlled with diet & meds. Patient denies myalgias or other med SE's. 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  He does not monitor CBG's  And last A1c was not at goal: Lab Results  Component Value Date   HGBA1C 6.6 (H) 01/13/2019       Also, patient has been on Thyroid replacement circa 2015.       Further, the patient also has history of Vitamin D Deficiency and supplements vitamin D without any suspected side-effects. Last vitamin D was at goal: Lab Results  Component Value Date   VD25OH 79 03/25/2018   Current Outpatient Medications on File Prior to Visit  Medication Sig  . aspirin 81 MG chewable tablet Chew 1 tablet (81 mg total) daily by mouth.  . Cyanocobalamin (VITAMIN B-12 PO) Take 1,000 mcg by mouth daily.   Marland Kitchen  ELIQUIS 5 MG TABS tablet TAKE 1 TABLET BY MOUTH TWICE A DAY  . ENTRESTO 24-26 MG TAKE 1 TABLET BY MOUTH TWICE A DAY  . ezetimibe (ZETIA) 10 MG tablet TAKE 1 TABLET BY MOUTH EVERY DAY  . FLUoxetine (PROZAC) 40 MG capsule TAKE 1 CAPSULE DAILY FOR MOOD (Patient taking differently: Take 40 mg by mouth daily. TAKE 1 CAPSULE DAILY FOR MOOD)  . furosemide (LASIX) 40 MG tablet Take 1 tablet daily for BP & Fluid  . IRON PO Take 65 mg (of elemental iron) every other day  . isosorbide mononitrate (IMDUR) 30 MG 24 hr tablet TAKE 1 TABLET (30 MG TOTAL) DAILY BY MOUTH.  Marland Kitchen levothyroxine (SYNTHROID, LEVOTHROID) 100 MCG tablet TAKE 1 TABLET (100 MCG TOTAL) BY MOUTH DAILY BEFORE BREAKFAST. (Patient taking differently: Take 100 mcg by mouth daily before breakfast. )  . Magnesium 500 MG TABS Take 500 mg by mouth 4 (four) times daily.   . metFORMIN (GLUCOPHAGE-XR) 500 MG 24 hr tablet TAKE 1 TABLET WITH BREAKFAST AND LUNCH & 2 TABLETS AT SUPPER FOR DIABETES (Patient taking differently: Take 500-1,000 mg by mouth See admin instructions. Taking 1 tablet (500mg ) at breakfast and lunch and 2 tablets (1000mg ) at supper)  . metoprolol tartrate (LOPRESSOR) 25 MG tablet  Take 1 tablet 2 x /day for BP & Heart  . nitroGLYCERIN (NITROSTAT) 0.4 MG SL tablet Place 1 tablet (0.4 mg total) every 5 (five) minutes as needed under the tongue.  . Omega-3 Fatty Acids (FISH OIL PO) Take 2,000 mg by mouth daily.  Marland Kitchen omeprazole (PRILOSEC) 40 MG capsule TAKE 1 CAPSULE DAILY FOR ACID INDIGESTION & REFLUX  . potassium chloride SA (K-DUR,KLOR-CON) 20 MEQ tablet Take 20 mEq by mouth daily.   . rosuvastatin (CRESTOR) 40 MG tablet Take 1 tablet (40 mg total) by mouth daily. For cholesterol.  . Vitamin D, Ergocalciferol, (DRISDOL) 50000 UNITS CAPS capsule Take 1 capsule (50,000 Units total) by mouth 3 (three) times a week. (Patient taking differently: Take 50,000 Units by mouth every Wednesday. )   No current facility-administered medications on file  prior to visit.    Allergies  Allergen Reactions  . Lisinopril Cough   PMHx:   Past Medical History:  Diagnosis Date  . Acute inferior myocardial infarction ([place]) 10/24/2017  . Adenomatous colon polyp 02/15/2009  . Bradycardia   . CAD (coronary artery disease)    remote MI in 2004 with PCI; s/p CABG x 3 03/2012  . CHF (congestive heart failure) ([place])   . COPD (chronic obstructive pulmonary disease) ([place])   . Depression   . Family history of malignant neoplasm of gastrointestinal tract   . GERD (gastroesophageal reflux disease)   . Hyperlipidemia   . Hypertension   . Hypothyroidism   . Myocardial infarction ([place])    2004  . Obesity   . Prediabetes   . 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 Tobacco abuse 03/28/2012  . Vitamin D deficiency    Immunization History  Administered Date(s) Administered  . PPD Test 06/15/2014, 07/10/2016  . Pneumococcal Polysaccharide-23 01/05/2009, 08/10/2014  . Td 01/05/2009  . Zoster 01/03/2016   Past Surgical History:  Procedure Laterality Date  . BACK SURGERY    . COLONOSCOPY  02/15/2009  . CORONARY ANGIOPLASTY WITH STENT PLACEMENT     about 8 years ago at [facility]  . CORONARY ARTERY BYPASS GRAFT  04/02/2012   Procedure: CORONARY ARTERY BYPASS GRAFTING (CABG);  Surgeon: Rexene Alberts, MD;  Location: [place];  Service: Open Heart Surgery;  Laterality: N/A;  On pump, times three graphs using endoscopically harvested right greater saphenous vein and left internal mammary artery.   [place] ACUTE MI REVASCULARIZATION N/A 10/23/2017   Procedure: Coronary/Graft Acute MI Revascularization;  Surgeon: Jettie Booze, MD;  Location: [place] [facility];  Service: Cardiovascular;  Laterality: N/A;  . KNEE SURGERY    . LEFT HEART CATH AND CORS/GRAFTS ANGIOGRAPHY N/A 10/23/2017   Procedure: LEFT HEART CATH AND CORS/GRAFTS ANGIOGRAPHY;  Surgeon: Jettie Booze, MD;  Location: [place] [facility];  Service: Cardiovascular;  Laterality: N/A;  . TONSILLECTOMY     FHx:    Reviewed / unchanged  SHx:    Reviewed / unchanged   Systems Review:  Constitutional: Denies fever, chills, wt changes, headaches, insomnia, fatigue, night sweats, change in appetite. Eyes: Denies redness, blurred vision, diplopia, discharge, itchy, watery eyes.  ENT: Denies discharge, congestion, post nasal drip, epistaxis, sore throat, earache, hearing loss, dental pain, tinnitus, vertigo, sinus pain, snoring.  CV: Denies chest pain, palpitations, irregular heartbeat, syncope, dyspnea, diaphoresis, orthopnea, PND, claudication or edema. Respiratory: denies cough, dyspnea, DOE, pleurisy, hoarseness, laryngitis, wheezing.  Gastrointestinal: Denies dysphagia, odynophagia, heartburn, reflux, water brash, abdominal pain or cramps, nausea, vomiting, bloating, diarrhea, constipation, hematemesis, melena, hematochezia  or hemorrhoids. Genitourinary: Denies dysuria, frequency, urgency, nocturia, hesitancy, discharge, hematuria or flank pain. Musculoskeletal: Denies arthralgias, myalgias, stiffness, jt. swelling, pain, limping or strain/sprain.  Skin: Denies pruritus, rash, hives, warts, acne, eczema or change in skin lesion(s). Neuro: No weakness, tremor, incoordination, spasms, paresthesia or pain. Psychiatric: Denies confusion, memory loss or sensory loss. Endo: Denies change in weight, skin or hair change.  Heme/Lymph: No excessive bleeding, bruising or enlarged lymph nodes.  Physical Exam  BP 128/88   Pulse 68   Temp (!) 97.4 F (36.3 C)   Ht 6\' 3"  (1.905 m)   Wt 250 lb (113.4 kg)   SpO2 97%   BMI 31.25 kg/m   Appears  over nourished  and in no distress.  Eyes: PERRLA, EOMs, conjunctiva no swelling or erythema. Sinuses: No frontal/maxillary tenderness ENT/Mouth: EAC's clear, TM's nl w/o erythema, bulging. Nares clear w/o erythema, swelling, exudates. Oropharynx clear without erythema or  exudates. Oral hygiene is good. Tongue normal, non obstructing. Hearing intact.  Neck: Supple. Thyroid not palpable. Car 2+/2+ without bruits, nodes or JVD. Chest: Respirations nl with BS clear & equal w/o rales, rhonchi, wheezing or stridor.  Cor: Heart sounds normal w/ regular rate and rhythm without sig. murmurs, gallops, clicks or rubs. Peripheral pulses normal and equal  without edema.  Abdomen: Soft & bowel sounds normal. Non-tender w/o guarding, rebound, hernias, masses or organomegaly.  Lymphatics: Unremarkable.  Musculoskeletal: Full ROM all peripheral extremities, joint stability, 5/5 strength and normal gait.  Skin: Warm, dry without exposed rashes, lesions or ecchymosis apparent.  Neuro: Cranial nerves intact, reflexes equal bilaterally. Sensory-motor testing grossly intact. Tendon reflexes grossly intact.  Pysch: Alert & oriented x 3.  Insight and judgement are limited by his fatalistic attitude.  Assessment and Plan:  - Continue medication, monitor blood pressure at home.  - Continue DASH diet.  Reminder to go to the ER if any CP,  SOB, nausea, dizziness, severe HA, changes vision/speech.  - Continue diet/meds, exercise,& lifestyle modifications.  - Continue monitor periodic cholesterol/liver & renal functions   1. Essential hypertension  - CBC with Differential/Platelet - COMPLETE METABOLIC PANEL WITH GFR - Magnesium - TSH  2. Hyperlipidemia, mixed  - Lipid panel - TSH  3. Type 2 diabetes mellitus with stage 2 chronic kidney disease, without long-term current use of insulin (HCC)  - Continue diet, exercise  - Lifestyle modifications.  - Monitor appropriate labs.  - Hemoglobin A1c - Insulin, random  4. Vitamin D deficiency  - Continue supplementation.  - VITAMIN D 25 Hydroxyl  5. Coronary artery disease involving native coronary artery of native heart without angina pectoris  - Lipid panel  6. Paroxysmal atrial fibrillation (HCC)  - TSH  7.  Hypothyroidism  - TSH  8. Gastroesophageal reflux disease  - CBC with Differential/Platelet  9. Medication management  - CBC with Differential/Platelet - COMPLETE METABOLIC PANEL WITH GFR - Magnesium - Lipid panel - TSH - Hemoglobin A1c - Insulin, random - VITAMIN D 25 Hydroxyl      Discussed  regular exercise, BP monitoring, weight control to achieve/maintain BMI less than 25 and discussed med and SE's. Recommended labs to assess and monitor clinical status with further disposition pending results of labs.I discussed the assessment and treatment plan with the patient. The patient was provided an opportunity to ask questions and all were answered. The patient agreed with  the plan and demonstrated an understanding of the instructions. I provided over 25 minutes of exam, counseling, chart review and  complex critical decision making was performed         Kirtland Bouchard, MD

## 2019-05-04 NOTE — Patient Instructions (Signed)

## 2019-05-05 ENCOUNTER — Ambulatory Visit: Payer: 59 | Admitting: Internal Medicine

## 2019-05-05 ENCOUNTER — Encounter: Payer: Self-pay | Admitting: Internal Medicine

## 2019-05-05 ENCOUNTER — Other Ambulatory Visit: Payer: Self-pay

## 2019-05-05 VITALS — BP 128/88 | HR 68 | Temp 97.4°F | Ht 75.0 in | Wt 250.0 lb

## 2019-05-05 DIAGNOSIS — N182 Chronic kidney disease, stage 2 (mild): Secondary | ICD-10-CM

## 2019-05-05 DIAGNOSIS — E559 Vitamin D deficiency, unspecified: Secondary | ICD-10-CM

## 2019-05-05 DIAGNOSIS — I10 Essential (primary) hypertension: Secondary | ICD-10-CM | POA: Diagnosis not present

## 2019-05-05 DIAGNOSIS — I480 Paroxysmal atrial fibrillation: Secondary | ICD-10-CM

## 2019-05-05 DIAGNOSIS — K219 Gastro-esophageal reflux disease without esophagitis: Secondary | ICD-10-CM

## 2019-05-05 DIAGNOSIS — E782 Mixed hyperlipidemia: Secondary | ICD-10-CM | POA: Diagnosis not present

## 2019-05-05 DIAGNOSIS — E039 Hypothyroidism, unspecified: Secondary | ICD-10-CM

## 2019-05-05 DIAGNOSIS — Z79899 Other long term (current) drug therapy: Secondary | ICD-10-CM

## 2019-05-05 DIAGNOSIS — E1122 Type 2 diabetes mellitus with diabetic chronic kidney disease: Secondary | ICD-10-CM

## 2019-05-05 DIAGNOSIS — I2510 Atherosclerotic heart disease of native coronary artery without angina pectoris: Secondary | ICD-10-CM

## 2019-05-06 LAB — COMPLETE METABOLIC PANEL WITH GFR
AG Ratio: 2 (calc) (ref 1.0–2.5)
ALT: 17 U/L (ref 9–46)
AST: 21 U/L (ref 10–35)
Albumin: 4.1 g/dL (ref 3.6–5.1)
Alkaline phosphatase (APISO): 83 U/L (ref 35–144)
BUN: 13 mg/dL (ref 7–25)
CO2: 32 mmol/L (ref 20–32)
Calcium: 9 mg/dL (ref 8.6–10.3)
Chloride: 102 mmol/L (ref 98–110)
Creat: 1.18 mg/dL (ref 0.70–1.25)
GFR, Est African American: 75 mL/min/{1.73_m2} (ref >=60)
GFR, Est Non African American: 64 mL/min/{1.73_m2} (ref >=60)
Globulin: 2.1 g/dL (calc) (ref 1.9–3.7)
Glucose, Bld: 115 mg/dL — ABNORMAL HIGH (ref 65–99)
Potassium: 4.5 mmol/L (ref 3.5–5.3)
Sodium: 138 mmol/L (ref 135–146)
Total Bilirubin: 0.8 mg/dL (ref 0.2–1.2)
Total Protein: 6.2 g/dL (ref 6.1–8.1)

## 2019-05-06 LAB — CBC WITH DIFFERENTIAL/PLATELET
Absolute Monocytes: 389 cells/uL (ref 200–950)
Basophils Absolute: 70 cells/uL (ref 0–200)
Basophils Relative: 1.2 %
Eosinophils Absolute: 180 cells/uL (ref 15–500)
Eosinophils Relative: 3.1 %
HCT: 40 % (ref 38.5–50.0)
Hemoglobin: 13.5 g/dL (ref 13.2–17.1)
Lymphs Abs: 1038 cells/uL (ref 850–3900)
MCH: 29.7 pg (ref 27.0–33.0)
MCHC: 33.8 g/dL (ref 32.0–36.0)
MCV: 87.9 fL (ref 80.0–100.0)
MPV: 12.3 fL (ref 7.5–12.5)
Monocytes Relative: 6.7 %
Neutro Abs: 4124 cells/uL (ref 1500–7800)
Neutrophils Relative %: 71.1 %
Platelets: 199 10*3/uL (ref 140–400)
RBC: 4.55 10*6/uL (ref 4.20–5.80)
RDW: 13.8 % (ref 11.0–15.0)
Total Lymphocyte: 17.9 %
WBC: 5.8 10*3/uL (ref 3.8–10.8)

## 2019-05-06 LAB — HEMOGLOBIN A1C
Hgb A1c MFr Bld: 6.5 % of total Hgb — ABNORMAL HIGH (ref <5.7)
Mean Plasma Glucose: 140 (calc)
eAG (mmol/L): 7.7 (calc)

## 2019-05-06 LAB — LIPID PANEL
Cholesterol: 108 mg/dL (ref <200)
HDL: 49 mg/dL (ref >=40)
LDL Cholesterol (Calc): 35 mg/dL (calc)
Non-HDL Cholesterol (Calc): 59 mg/dL (calc) (ref <130)
Total CHOL/HDL Ratio: 2.2 (calc) (ref <5.0)
Triglycerides: 159 mg/dL — ABNORMAL HIGH (ref <150)

## 2019-05-06 LAB — INSULIN, RANDOM: Insulin: 11.7 u[IU]/mL

## 2019-05-06 LAB — TSH: TSH: 0.29 mIU/L — ABNORMAL LOW (ref 0.40–4.50)

## 2019-05-06 LAB — MAGNESIUM: Magnesium: 1.7 mg/dL (ref 1.5–2.5)

## 2019-05-06 LAB — VITAMIN D 25 HYDROXY (VIT D DEFICIENCY, FRACTURES): Vit D, 25-Hydroxy: 53 ng/mL (ref 30–100)

## 2019-05-15 ENCOUNTER — Other Ambulatory Visit: Payer: Self-pay | Admitting: Cardiovascular Disease

## 2019-05-17 ENCOUNTER — Other Ambulatory Visit: Payer: Self-pay | Admitting: Internal Medicine

## 2019-05-17 DIAGNOSIS — E785 Hyperlipidemia, unspecified: Secondary | ICD-10-CM

## 2019-06-07 ENCOUNTER — Other Ambulatory Visit: Payer: Self-pay | Admitting: Cardiovascular Disease

## 2019-06-09 NOTE — Telephone Encounter (Signed)
Pt is a 67yom requesting eliquis 5mg . Pt wt 113.4kg, scr 1.18(05/05/19), lov w/ Dr Acie Fredrickson (02/11/18) Will grant one month and add a note to pharmacy stating an appt w/cardiologist is needed for further refills.

## 2019-06-25 ENCOUNTER — Other Ambulatory Visit: Payer: Self-pay | Admitting: Internal Medicine

## 2019-06-25 DIAGNOSIS — K219 Gastro-esophageal reflux disease without esophagitis: Secondary | ICD-10-CM

## 2019-07-07 ENCOUNTER — Other Ambulatory Visit: Payer: Self-pay | Admitting: Cardiovascular Disease

## 2019-07-07 NOTE — Telephone Encounter (Signed)
Eliquis 5mg  refill request received; pt is 68 yrs old, wt-113.4kg, Crea-1.18 on 05/05/2019, last visit with Dr. Acie Fredrickson on 02/11/2018-so pt needs an appt (per last OV note pt was due 3 months after that visit), pt med was last refilled on 06/09/2019 and a note was on the refill that pt needs an appt but the pt was not called to ensure proper notification from documentation. Called pt as he needs an appt with Cardiologist-Dr. Acie Fredrickson, pt's voicemail came on and I left a message for pt to callback to schedule an appt with Dr. Acie Fredrickson, will await a call back or try pt back later.

## 2019-07-12 ENCOUNTER — Other Ambulatory Visit: Payer: Self-pay | Admitting: Adult Health

## 2019-07-12 ENCOUNTER — Other Ambulatory Visit: Payer: Self-pay | Admitting: Internal Medicine

## 2019-07-17 NOTE — Telephone Encounter (Signed)
Called Curtis Perez and had to leave a message for the Curtis Perez to let him know that he needs to schedule an appt with Dr. Acie Fredrickson as he is overdue and that we have tried to reach him in 07/07/2019 as well. Will await for Curtis Perez to callback or schedule an appt.

## 2019-07-18 NOTE — Telephone Encounter (Signed)
Called pt to inform him his needs to schedule an appointment with Dr. Acie Fredrickson to refill his Eliquis. Unable to get in touch with pt. LMOM for pt call back.

## 2019-07-22 ENCOUNTER — Telehealth: Payer: Self-pay | Admitting: Cardiovascular Disease

## 2019-07-22 NOTE — Telephone Encounter (Signed)
Left message again.

## 2019-07-22 NOTE — Telephone Encounter (Signed)
New message    *STAT* If patient is at the pharmacy, call can be transferred to refill team.   1. Which medications need to be refilled? (please list name of each medication and dose if known) ELIQUIS 5 MG TABS tablet  2. Which pharmacy/location (including street and city if local pharmacy) is medication to be sent to?[id number] - [place], [place] - [address] AT [place]  3. Do they need a 30 day or 90 day supply? [place]

## 2019-07-23 NOTE — Telephone Encounter (Signed)
Refill sent in since pt has now made appt with Dr Acie Fredrickson.

## 2019-07-23 NOTE — Telephone Encounter (Signed)
Pt made appt for October with Dr Acie Fredrickson, will send in refill.

## 2019-08-07 NOTE — Progress Notes (Signed)
FOLLOW UP  Assessment and Plan:  Essential hypertension -     CBC with Differential/Platelet -     COMPLETE METABOLIC PANEL WITH GFR -     TSH - continue medications, DASH diet, exercise and monitor at home. Call if greater than 130/80.   Chronic combined systolic and diastolic CHF (congestive heart failure) ([place]) Will follow up cardiology, declines additional meds like farxiga Weight is up but he denies SOB, leg swelling etc will monitor at home.  Disease process and medications discussed. Questions answered fully. Emphasized salt restriction, less than 2000mg  a day. Encouraged daily monitoring of the patient's weight, call office if 5 lb weight loss or gain in a day.  Encouraged regular exercise. If any increasing shortness of breath, swelling, or chest pressure go to ER immediately.  decrease your fluid intake to less than 2 L daily please remember to always increase your potassium intake with any increase of your fluid pill.   Persistent atrial fibrillation Continue medications, rate controlled, follow up cardio  Chronic obstructive pulmonary disease, unspecified COPD type ([place]) Advised to stop smoking - risks discussed. Declines medications.  CXR annually versus CT  Type 2 diabetes mellitus with stage 2 chronic kidney disease, without long-term current use of insulin (HCC) -     Hemoglobin A1c Discussed general issues about diabetes pathophysiology and management., Educational material distributed., Suggested low cholesterol diet., Encouraged aerobic exercise., Discussed foot care., Reminded to get yearly retinal exam. Declines addition of farxiga for CHF and DM  Coronary artery disease involving coronary bypass graft of native heart with unstable angina pectoris ([place]) Control blood pressure, cholesterol, glucose, increase exercise.  Continue cardio follow up Suggest stop smoking but he states, "something has to kill him"  Hypothyroidism, unspecified type -      TSH  Mixed hyperlipidemia -     Lipid panel check lipids decrease fatty foods increase activity.   Tobacco abuse Not interested in quitting, not interested in life prolonging measures  Medication management -     Magnesium  Vitamin D deficiency -     VITAMIN D 25 Hydroxy (Vit-D Deficiency, Fractures)     Continue diet and meds as discussed. Further disposition pending results of labs. Discussed med's effects and SE's.   Over 30 minutes of exam, counseling, chart review, and critical decision making was performed.   Future Appointments  Date Time Provider [place]  09/18/2019  3:00 PM Nahser, Wonda Cheng, MD [facility]  11/17/2019  3:45 PM Unk Pinto, MD [facility] None    ----------------------------------------------------------------------------------------------------------------------  HPI 68 y.o. male  presents for 3 month follow up on CAD, CHF, hypertension, cholesterol, T2 diabetes, hypothyroid, obesity and vitamin D deficiency.   He has hx of ACS in 2005 s/p PCA/stent, CABG 2013, and most recently had DEX in p LCX by Dr. Sondra Come.  Afib x 10/2018, on Eliquis and lopressor BID. Has CHF, last echo 10/2018 with EF 30-35%, moderate AVR.  Has history of noncompliance.   He continues with lopressor 25 mg BID, entresto 24/36 mg BID, imdur 30 mg daily, lasix 40 mg daily, with plan to increase to BID x 3 days when needed for exacerbations.  He is not on spirolacotone or farxiga.   BMI is Body mass index is 32.7 kg/m., he is working on diet and exercise. Wt Readings from Last 3 Encounters:  08/11/19 261 lb 9.6 oz (118.7 kg)  05/05/19 250 lb (113.4 kg)  01/13/19 241 lb (109.3 kg)     Lab Results  Component Value Date   GFRNONAA 64 05/05/2019    he unfortunately currently continues to smoke but has cut down from 2 packs to 1.5 packs a day; discussed risks associated with smoking, patient is not willing to quit, declines medications to assist.    His blood pressure has been controlled at home, today their BP is BP: 122/86   He does not workout. He denies chest pain, he does endorse exertional dyspnea, and some dizziness   He is on cholesterol medication and denies myalgias. His cholesterol is not at goal. The cholesterol last visit was:   Lab Results  Component Value Date   CHOL 108 05/05/2019   HDL 49 05/05/2019   LDLCALC 35 05/05/2019   TRIG 159 (H) 05/05/2019   CHOLHDL 2.2 05/05/2019    He has been working on diet for T2DM on metformin 500 mg x 1 tabs, and denies foot ulcerations, increased appetite, nausea, paresthesia of the feet, polydipsia, polyuria, visual disturbances and vomiting. He does not check sugars at home. Last A1C in the office was:  Lab Results  Component Value Date   HGBA1C 6.5 (H) 05/05/2019     Lab Results  Component Value Date   GFRNONAA 64 05/05/2019   He is on thyroid medication. His medication was not changed last visit, he is on 1 a day of 126mcg Lab Results  Component Value Date   TSH 0.29 (L) 05/05/2019   Patient is on Vitamin D supplement.   Lab Results  Component Value Date   VD25OH 53 05/05/2019        Current Medications:  Current Outpatient Medications on File Prior to Visit  Medication Sig  . aspirin 81 MG chewable tablet Chew 1 tablet (81 mg total) daily by mouth.  . Cyanocobalamin (VITAMIN B-12 PO) Take 1,000 mcg by mouth daily.   Marland Kitchen ELIQUIS 5 MG TABS tablet TAKE 1 TABLET BY MOUTH TWICE A DAY  . ENTRESTO 24-26 MG TAKE 1 TABLET BY MOUTH TWICE A DAY  . ezetimibe (ZETIA) 10 MG tablet Take 1 tablet Daily for Cholesterol  . FLUoxetine (PROZAC) 40 MG capsule Take 1 capsule Daily for Mood  . furosemide (LASIX) 40 MG tablet Take 1 tablet daily for BP & Fluid  . IRON PO Take 65 mg (of elemental iron) every other day  . isosorbide mononitrate (IMDUR) 30 MG 24 hr tablet Take 1 tablet Daily for Heart  . levothyroxine (SYNTHROID) 100 MCG tablet Take 1 tablet daily on an empty stomach  with only water for 30 minutes & no Antacid meds, Calcium or Magnesium for 4 hours & avoid Biotin  . Magnesium 500 MG TABS Take 500 mg by mouth 4 (four) times daily.   . metFORMIN (GLUCOPHAGE-XR) 500 MG 24 hr tablet Take 2 tablets 2 x/day with meals for Diabetes  . metoprolol tartrate (LOPRESSOR) 25 MG tablet Take 1 tablet 2 x /day for BP & Heart  . nitroGLYCERIN (NITROSTAT) 0.4 MG SL tablet Place 1 tablet (0.4 mg total) every 5 (five) minutes as needed under the tongue.  . Omega-3 Fatty Acids (FISH OIL PO) Take 2,000 mg by mouth daily.  Marland Kitchen omeprazole (PRILOSEC) 40 MG capsule Take 1 capsule Daily for Acid Indigestion & Reflux  . potassium chloride SA (K-DUR,KLOR-CON) 20 MEQ tablet Take 20 mEq by mouth daily.   . rosuvastatin (CRESTOR) 40 MG tablet Take 1 tablet Daily for Cholesterol  . Vitamin D, Ergocalciferol, (DRISDOL) 50000 UNITS CAPS capsule Take 1 capsule (50,000 Units total) by mouth  3 (three) times a week. (Patient taking differently: Take 50,000 Units by mouth every Wednesday. )   No current facility-administered medications on file prior to visit.      Allergies:  Allergies  Allergen Reactions  . Lisinopril Cough     Medical History:  Past Medical History:  Diagnosis Date  . Acute inferior myocardial infarction ([place]) 10/24/2017  . Adenomatous colon polyp 02/15/2009  . Bradycardia   . CAD (coronary artery disease)    remote MI in 2004 with PCI; s/p CABG x 3 03/2012  . CHF (congestive heart failure) ([place])   . COPD (chronic obstructive pulmonary disease) ([place])   . Depression   . Family history of malignant neoplasm of gastrointestinal tract   . GERD (gastroesophageal reflux disease)   . Hyperlipidemia   . Hypertension   . Hypothyroidism   . Myocardial infarction ([place])    2004  . Obesity   . Prediabetes   . 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 Tobacco abuse 03/28/2012  . Vitamin D deficiency    Family history-  Reviewed and unchanged Social history- Reviewed and unchanged   Review of Systems:  Review of Systems  Constitutional: Negative for malaise/fatigue and weight loss.  HENT: Negative for hearing loss and tinnitus.   Eyes: Negative for blurred vision and double vision.  Respiratory: Negative for cough, shortness of breath and wheezing.   Cardiovascular: Negative for chest pain, palpitations, orthopnea, claudication and leg swelling.  Gastrointestinal: Negative for abdominal pain, blood in stool, constipation, diarrhea, heartburn, melena, nausea and vomiting.  Genitourinary: Negative.   Musculoskeletal: Negative for joint pain and myalgias.  Skin: Negative for rash.  Neurological: Positive for dizziness (with standing). Negative for tingling, sensory change, weakness and headaches.  Endo/Heme/Allergies: Negative for polydipsia.  Psychiatric/Behavioral: Negative.   All other systems reviewed and are negative.   Physical Exam: BP 122/86   Pulse (!) 56   Temp 98.7 F (37.1 C)   Ht 6\' 3"  (1.905 m)   Wt 261 lb 9.6 oz (118.7 kg)   SpO2 98%   BMI 32.70 kg/m  Wt Readings from Last 3 Encounters:  08/11/19 261 lb 9.6 oz (118.7 kg)  05/05/19 250 lb (113.4 kg)  01/13/19 241 lb (109.3 kg)   General Appearance: Well nourished, in no apparent distress. Eyes: PERRLA, EOMs, conjunctiva no swelling or erythema Sinuses: No Frontal/maxillary tenderness ENT/Mouth: Ext aud canals clear, TMs without erythema, bulging. No erythema, swelling, or exudate on post pharynx.  Tonsils not swollen or erythematous. Hearing normal. Very poor dentition with missing teeth.  Neck: Supple, thyroid normal.  Respiratory: Respiratory effort normal, BS equal bilaterally without rales, rhonchi, wheezing or stridor.  Cardio: RRR with no MRGs. Brisk peripheral pulses with 1+ edema Abdomen: Soft, + BS.  Non tender, no guarding, rebound, hernias, masses. Lymphatics: Non tender without lymphadenopathy.  Musculoskeletal:  Full ROM, 5/5 strength, Normal gait Skin: Warm, dry without rashes, lesions, ecchymosis.  Neuro: Cranial nerves intact. No cerebellar symptoms.  Psych: Awake and oriented X 3, normal affect, Insight and Judgment appropriate.    Vicie Mutters, PA-C 3:57 PM [facility]

## 2019-08-11 ENCOUNTER — Other Ambulatory Visit: Payer: Self-pay

## 2019-08-11 ENCOUNTER — Ambulatory Visit: Payer: 59 | Admitting: Physician Assistant

## 2019-08-11 ENCOUNTER — Encounter: Payer: Self-pay | Admitting: Physician Assistant

## 2019-08-11 VITALS — BP 122/86 | HR 56 | Temp 98.7°F | Ht 75.0 in | Wt 261.6 lb

## 2019-08-11 DIAGNOSIS — I25700 Atherosclerosis of coronary artery bypass graft(s), unspecified, with unstable angina pectoris: Secondary | ICD-10-CM

## 2019-08-11 DIAGNOSIS — E039 Hypothyroidism, unspecified: Secondary | ICD-10-CM

## 2019-08-11 DIAGNOSIS — Z720 Tobacco use: Secondary | ICD-10-CM

## 2019-08-11 DIAGNOSIS — N182 Chronic kidney disease, stage 2 (mild): Secondary | ICD-10-CM

## 2019-08-11 DIAGNOSIS — I5042 Chronic combined systolic (congestive) and diastolic (congestive) heart failure: Secondary | ICD-10-CM

## 2019-08-11 DIAGNOSIS — J449 Chronic obstructive pulmonary disease, unspecified: Secondary | ICD-10-CM

## 2019-08-11 DIAGNOSIS — E782 Mixed hyperlipidemia: Secondary | ICD-10-CM

## 2019-08-11 DIAGNOSIS — I10 Essential (primary) hypertension: Secondary | ICD-10-CM | POA: Diagnosis not present

## 2019-08-11 DIAGNOSIS — Z79899 Other long term (current) drug therapy: Secondary | ICD-10-CM

## 2019-08-11 DIAGNOSIS — I4819 Other persistent atrial fibrillation: Secondary | ICD-10-CM | POA: Diagnosis not present

## 2019-08-11 DIAGNOSIS — K219 Gastro-esophageal reflux disease without esophagitis: Secondary | ICD-10-CM

## 2019-08-11 DIAGNOSIS — E559 Vitamin D deficiency, unspecified: Secondary | ICD-10-CM

## 2019-08-11 DIAGNOSIS — E1122 Type 2 diabetes mellitus with diabetic chronic kidney disease: Secondary | ICD-10-CM

## 2019-08-11 NOTE — Patient Instructions (Signed)
I want to challenge you to lose 10 lbs before the next appointment.  That would be about 3 lbs a month!  This is very achievable and I know you can do it.  10 lbs is actually 40 lbs of pressure of your joints, back, hips, knees and ankles.  10lbs may be enough to help your blood pressure and cholesterol for next visit.   STARTING A DIABETIC MEDICATION- we may do this depending on your sugars- this medication can help with heart failure as well  The medication we are going to start you on is a sodium-glucose cotransporter-2 (SGLT2) inhibitors for your diabetes and weight.   This can decrease your blood pressure so please contact us if you have any dizziness, sometime we need to decrease or stop fluid pills or decrease BP meds.  You are peeing out 300-400 calories a day of sugar which can lead to yeast infections, you can take the diflucan as needed but if the infections continue we will stop the medications.  If you get any pain or discomfort around your buttocks or genitals please let us know if it does not go away. We will have you follow up in 1 month to check your kidney function and weight. Call if you need anything.   Here is some information to help you keep your heart healthy: Move it! - Aim for 30 mins of activity every day. Take it slowly at first. Talk to [place] before starting any new exercise program.   Lose it.  -Body Mass Index (BMI) can indicate if you need to lose weight. A healthy range is 18.5-24.9. For a BMI calculator, go to [facility] [employer].com  Waist Management -Excess abdominal fat is a risk factor for heart disease, diabetes, asthma, stroke and more. Ideal waist circumference is less than 35" for women and less than 40" for men.   Eat Right -focus on fruits, vegetables, whole grains, and meals you make yourself. Avoid foods with trans fat and high sugar/sodium content.   Snooze or Snore? - Loud snoring can be a sign of sleep apnea, a significant risk factor for high blood  pressure, heart attach, stroke, and heart arrhythmias.  Kick the habit -Quit Smoking! Avoid second hand smoke. A single cigarette raises your blood pressure for 20 mins and increases the risk of heart attack and stroke for the next 24 hours.   Are Aspirin and Supplements right for you? -Add ENTERIC COATED low dose 81 mg Aspirin daily OR can do every other day if you have easy bruising to protect your heart and head. As well as to reduce risk of Colon Cancer by 20 %, Skin Cancer by 26 % , Melanoma by 46% and Pancreatic cancer by 60%  Say "No to Stress -There may be little you can do about problems that cause stress. However, techniques such as long walks, meditation, and exercise can help you manage it.   Start Now! - Make changes one at a time and set reasonable goals to increase your likelihood of success.

## 2019-08-12 LAB — CBC WITH DIFFERENTIAL/PLATELET
Absolute Monocytes: 525 cells/uL (ref 200–950)
Basophils Absolute: 48 cells/uL (ref 0–200)
Basophils Relative: 0.9 %
Eosinophils Absolute: 191 cells/uL (ref 15–500)
Eosinophils Relative: 3.6 %
HCT: 41.5 % (ref 38.5–50.0)
Hemoglobin: 13.8 g/dL (ref 13.2–17.1)
Lymphs Abs: 1208 cells/uL (ref 850–3900)
MCH: 29 pg (ref 27.0–33.0)
MCHC: 33.3 g/dL (ref 32.0–36.0)
MCV: 87.2 fL (ref 80.0–100.0)
MPV: 11.4 fL (ref 7.5–12.5)
Monocytes Relative: 9.9 %
Neutro Abs: 3328 cells/uL (ref 1500–7800)
Neutrophils Relative %: 62.8 %
Platelets: 206 10*3/uL (ref 140–400)
RBC: 4.76 10*6/uL (ref 4.20–5.80)
RDW: 13.7 % (ref 11.0–15.0)
Total Lymphocyte: 22.8 %
WBC: 5.3 10*3/uL (ref 3.8–10.8)

## 2019-08-12 LAB — COMPLETE METABOLIC PANEL WITH GFR
AG Ratio: 1.9 (calc) (ref 1.0–2.5)
ALT: 8 U/L — ABNORMAL LOW (ref 9–46)
AST: 12 U/L (ref 10–35)
Albumin: 4 g/dL (ref 3.6–5.1)
Alkaline phosphatase (APISO): 79 U/L (ref 35–144)
BUN/Creatinine Ratio: 15 (calc) (ref 6–22)
BUN: 22 mg/dL (ref 7–25)
CO2: 29 mmol/L (ref 20–32)
Calcium: 9.1 mg/dL (ref 8.6–10.3)
Chloride: 102 mmol/L (ref 98–110)
Creat: 1.47 mg/dL — ABNORMAL HIGH (ref 0.70–1.25)
GFR, Est African American: 57 mL/min/{1.73_m2} — ABNORMAL LOW (ref >=60)
GFR, Est Non African American: 49 mL/min/{1.73_m2} — ABNORMAL LOW (ref >=60)
Globulin: 2.1 g/dL (calc) (ref 1.9–3.7)
Glucose, Bld: 92 mg/dL (ref 65–99)
Potassium: 4.1 mmol/L (ref 3.5–5.3)
Sodium: 136 mmol/L (ref 135–146)
Total Bilirubin: 0.8 mg/dL (ref 0.2–1.2)
Total Protein: 6.1 g/dL (ref 6.1–8.1)

## 2019-08-12 LAB — LIPID PANEL
Cholesterol: 208 mg/dL — ABNORMAL HIGH (ref <200)
HDL: 58 mg/dL (ref >=40)
LDL Cholesterol (Calc): 128 mg/dL (calc) — ABNORMAL HIGH
Non-HDL Cholesterol (Calc): 150 mg/dL (calc) — ABNORMAL HIGH (ref <130)
Total CHOL/HDL Ratio: 3.6 (calc) (ref <5.0)
Triglycerides: 113 mg/dL (ref <150)

## 2019-08-12 LAB — VITAMIN D 25 HYDROXY (VIT D DEFICIENCY, FRACTURES): Vit D, 25-Hydroxy: 37 ng/mL (ref 30–100)

## 2019-08-12 LAB — HEMOGLOBIN A1C
Hgb A1c MFr Bld: 6.9 % of total Hgb — ABNORMAL HIGH (ref <5.7)
Mean Plasma Glucose: 151 (calc)
eAG (mmol/L): 8.4 (calc)

## 2019-08-12 LAB — MAGNESIUM: Magnesium: 1.9 mg/dL (ref 1.5–2.5)

## 2019-08-12 LAB — TSH: TSH: 1.22 mIU/L (ref 0.40–4.50)

## 2019-08-19 ENCOUNTER — Other Ambulatory Visit: Payer: Self-pay | Admitting: Cardiovascular Disease

## 2019-09-15 ENCOUNTER — Other Ambulatory Visit: Payer: Self-pay | Admitting: Cardiovascular Disease

## 2019-09-18 ENCOUNTER — Other Ambulatory Visit: Payer: Self-pay

## 2019-09-18 ENCOUNTER — Ambulatory Visit: Payer: 59 | Admitting: Cardiovascular Disease

## 2019-09-18 ENCOUNTER — Encounter: Payer: Self-pay | Admitting: Cardiovascular Disease

## 2019-09-18 VITALS — BP 116/76 | HR 84 | Ht 75.0 in | Wt 259.8 lb

## 2019-09-18 DIAGNOSIS — I4819 Other persistent atrial fibrillation: Secondary | ICD-10-CM | POA: Diagnosis not present

## 2019-09-18 DIAGNOSIS — I5042 Chronic combined systolic (congestive) and diastolic (congestive) heart failure: Secondary | ICD-10-CM

## 2019-09-18 DIAGNOSIS — I10 Essential (primary) hypertension: Secondary | ICD-10-CM | POA: Diagnosis not present

## 2019-09-18 NOTE — Progress Notes (Signed)
Cardiology Office Note:    Date:  09/18/2019   ID:  Curtis Perez, DOB 11/30/1951, MRN [id number]  PCP:  Unk Pinto, MD  Cardiologist:  No primary care provider on file.   Referring MD: Unk Pinto, MD   Problem list 1.  Coronary artery disease-status post coronary artery bypass grafting in 2013 2.  Cigarette smoking 3.  Hyperlipidemia  4.  Hypertension  Chief Complaint  Patient presents with  . Coronary Artery Disease  . Atrial Fibrillation    Feb. 25, 2019   Curtis Perez is a 68 y.o. male with a hx of coronary artery disease.  I met him 6 years ago   He recently had a non-ST segment elevation myocardial infarction in November. Cardiac catheter revealed a 99% stenosis of the circumflex artery and an occlusion of the saphenous vein graft to the OM and saphenous vein graft to the RCA.  Drug-eluting stent was placed in the proximal circumflex artery the distal circumflex was occluded and was not amenable to angioplasty.  His left ventricular systolic function is mildly reduced with an EF of 40-45%.  He continues to smoke.  He does not seem to have any interest in smoking cessation or dietary changes.     He developed CHF following his  NSTEMI .      Oct. 1, 2020 :  Curtis Perez is seen today for follow-up of his coronary artery disease.  He has a history of coronary artery bypass grafting.  He has a drug-eluting stent in the proximal circumflex artery.   He is in atrial fibrillation today.  Hospitalized in Nov. 2019.  He was started on Eliquis 5 mg twice a day.  The plan during the hospitalization was to schedule him for a TEE cardioversion or alternatively 4 weeks of anticoagulation followed by cardioversion. He has not had his cardioversion  Still smoking  No angina   Echocardiogram reveals an ejection fraction of 30 to 35%.  The fall and ejection fraction may be related to his atrial fibrillation with rapid ventricular response.  He was diuresed in the hospital several  liters.  He was started on Entresto..   Past Medical History:  Diagnosis Date  . Acute inferior myocardial infarction ([place]) 10/24/2017  . Adenomatous colon polyp 02/15/2009  . Bradycardia   . CAD (coronary artery disease)    remote MI in 2004 with PCI; s/p CABG x 3 03/2012  . CHF (congestive heart failure) ([place])   . COPD (chronic obstructive pulmonary disease) ([place])   . Depression   . Family history of malignant neoplasm of gastrointestinal tract   . GERD (gastroesophageal reflux disease)   . Hyperlipidemia   . Hypertension   . Hypothyroidism   . Myocardial infarction ([place])    2004  . Obesity   . Prediabetes   . 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 Tobacco abuse 03/28/2012  . Vitamin D deficiency     Past Surgical History:  Procedure Laterality Date  . BACK SURGERY    . COLONOSCOPY  02/15/2009  . CORONARY ANGIOPLASTY WITH STENT PLACEMENT     about 8 years ago at [facility]  . CORONARY ARTERY BYPASS GRAFT  04/02/2012   Procedure: CORONARY ARTERY BYPASS GRAFTING (CABG);  Surgeon: Rexene Alberts, MD;  Location: [place];  Service: Open Heart Surgery;  Laterality: N/A;  On pump, times three graphs using endoscopically harvested right  greater saphenous vein and left internal mammary artery.   [place] ACUTE MI REVASCULARIZATION N/A 10/23/2017   Procedure: Coronary/Graft Acute MI Revascularization;  Surgeon: Jettie Booze, MD;  Location: [facility];  Service: Cardiovascular;  Laterality: N/A;  . KNEE SURGERY    . LEFT HEART CATH AND CORS/GRAFTS ANGIOGRAPHY N/A 10/23/2017   Procedure: LEFT HEART CATH AND CORS/GRAFTS ANGIOGRAPHY;  Surgeon: Jettie Booze, MD;  Location: [facility];  Service: Cardiovascular;  Laterality: N/A;  . TONSILLECTOMY      Current Medications: Current Meds  Medication Sig  . aspirin 81 MG chewable tablet Chew 1 tablet (81 mg total) daily by mouth.  . Cyanocobalamin  (VITAMIN B-12 PO) Take 1,000 mcg by mouth daily.   Marland Kitchen ELIQUIS 5 MG TABS tablet TAKE 1 TABLET BY MOUTH TWICE A DAY  . ENTRESTO 24-26 MG TAKE 1 TABLET BY MOUTH TWICE A DAY  . ezetimibe (ZETIA) 10 MG tablet Take 1 tablet Daily for Cholesterol  . FLUoxetine (PROZAC) 40 MG capsule Take 1 capsule Daily for Mood  . furosemide (LASIX) 40 MG tablet Take 1 tablet daily for BP & Fluid  . IRON PO Take 65 mg (of elemental iron) every other day  . isosorbide mononitrate (IMDUR) 30 MG 24 hr tablet Take 1 tablet Daily for Heart  . levothyroxine (SYNTHROID) 100 MCG tablet Take 1 tablet daily on an empty stomach with only water for 30 minutes & no Antacid meds, Calcium or Magnesium for 4 hours & avoid Biotin  . Magnesium 500 MG TABS Take 500 mg by mouth 4 (four) times daily.   . metFORMIN (GLUCOPHAGE-XR) 500 MG 24 hr tablet Take 2 tablets 2 x/day with meals for Diabetes  . metoprolol tartrate (LOPRESSOR) 25 MG tablet Take 1 tablet 2 x /day for BP & Heart  . nitroGLYCERIN (NITROSTAT) 0.4 MG SL tablet Place 1 tablet (0.4 mg total) every 5 (five) minutes as needed under the tongue.  . Omega-3 Fatty Acids (FISH OIL PO) Take 2,000 mg by mouth daily.  Marland Kitchen omeprazole (PRILOSEC) 40 MG capsule Take 1 capsule Daily for Acid Indigestion & Reflux  . potassium chloride SA (K-DUR,KLOR-CON) 20 MEQ tablet Take 20 mEq by mouth daily.   . rosuvastatin (CRESTOR) 40 MG tablet Take 1 tablet Daily for Cholesterol  . Vitamin D, Ergocalciferol, (DRISDOL) 50000 UNITS CAPS capsule Take 1 capsule (50,000 Units total) by mouth 3 (three) times a week.     Allergies:   Lisinopril   Social History   Socioeconomic History  . Marital status: Divorced    Spouse name: Not on file  . Number of children: 0  . Years of education: Not on file  . Highest education level: Not on file  Occupational History  . Occupation: Disability  . Occupation: Counsellor for [employer]  . Financial resource strain: Not very hard  . Food  insecurity    Worry: Never true    Inability: Never true  . Transportation needs    Medical: No    Non-medical: No  Tobacco Use  . Smoking status: Current Every Day Smoker    Packs/day: 2.00    Years: 40.00    Pack years: 80.00    Types: Cigarettes  . Smokeless tobacco: Never Used  . Tobacco comment: down from 2ppd  Substance and Sexual Activity  . Alcohol use: No    Alcohol/week: 2.0 standard drinks    Types: 1 Glasses of wine, 1 Shots of liquor  per week    Comment: Rarely  . Drug use: No  . Sexual activity: Not Currently  Lifestyle  . Physical activity    Days per week: Patient refused    Minutes per session: Patient refused  . Stress: Patient refused  Relationships  . Social Herbalist on phone: Patient refused    Gets together: Patient refused    Attends religious service: Patient refused    Active member of club or organization: Patient refused    Attends meetings of clubs or organizations: Patient refused    Relationship status: Patient refused  Other Topics Concern  . Not on file  Social History Narrative  . Not on file     Family History: The patient's family history includes Alzheimer's disease in his mother; Colon cancer in his father and paternal grandmother; Hypertension in his mother. There is no history of Esophageal cancer, Rectal cancer, or Stomach cancer.  ROS:   Please see the history of present illness.     All other systems reviewed and are negative.  EKGs/Labs/Other Studies Reviewed:    The following studies were reviewed today:   EKG:    Atrial  Fib with HR of 84.   Old Inf. MI   Recent Labs: 10/15/2018: B Natriuretic Peptide 978.3 08/11/2019: ALT 8; BUN 22; Creat 1.47; Hemoglobin 13.8; Magnesium 1.9; Platelets 206; Potassium 4.1; Sodium 136; TSH 1.22  Recent Lipid Panel    Component Value Date/Time   CHOL 208 (H) 08/11/2019 0000   CHOL 92 (L) 02/11/2018 1011   TRIG 113 08/11/2019 0000   HDL 58 08/11/2019 0000   HDL 42  02/11/2018 1011   CHOLHDL 3.6 08/11/2019 0000   VLDL 34 10/24/2017 0247   LDLCALC 128 (H) 08/11/2019 0000     Physical Exam: Blood pressure 116/76, pulse 84, height '6\' 3"'  (1.905 m), weight 259 lb 12.8 oz (117.8 kg), SpO2 97 %.  GEN:   Obese , middle age man  HEENT: Normal NECK: No JVD; No carotid bruits LYMPHATICS: No lymphadenopathy CARDIAC: irreg. IRreg. RESPIRATORY:  Clear to auscultation without rales, wheezing or rhonchi  ABDOMEN: Soft, non-tender, non-distended MUSCULOSKELETAL:  No edema; No deformity  SKIN: Warm and dry NEUROLOGIC:  Alert and oriented x 3   ASSESSMENT:    1. Chronic combined systolic and diastolic CHF (congestive heart failure) (HCC)   2. Persistent atrial fibrillation (HCC)    PLAN:    In order of problems listed above:  1.  CAD: Has severe diffuse coronary artery disease. He had coronary artery bypass grafting.  He had stent placement in November, 2018.  He is not had any episodes of angina.  Continue current medications.    2.  Acute on chronic sys. / dias CHF :   His symptoms are better.  He has been on Eliquis.  I would like to schedule him for cardioversion.  We will repeat his echocardiogram several weeks after his cardioversion.  3.  hyperlipidemiia    .   4.  Atrial fibrillation:  He has had persistent atrial fibrillation since his hospitalization last November.  He was never scheduled for cardioversion.  We will schedule him for cardioversion now in an attempt to get him back in sinus rhythm.  He is completely asymptomatic.  Continue Eliquis.  Medication Adjustments/Labs and Tests Ordered: Current medicines are reviewed at length with the patient today.  Concerns regarding medicines are outlined above.  Orders Placed This Encounter  Procedures  . Basic Metabolic Panel (  BMET)  . CBC  . EKG 12-Lead  . ECHOCARDIOGRAM COMPLETE   No orders of the defined types were placed in this encounter.   Signed, Mertie Moores, MD  09/18/2019  5:45 PM    [place]

## 2019-09-18 NOTE — Patient Instructions (Signed)
Medication Instructions:  Your physician recommends that you continue on your current medications as directed. Please refer to the Current Medication list given to you today.  If you need a refill on your cardiac medications before your next appointment, please call your pharmacy.   Lab work: TODAY - CBC, BMET If you have labs (blood work) drawn today and your tests are completely normal, you will receive your results only by: Marland Kitchen MyChart Message (if you have MyChart) OR . A paper copy in the mail If you have any lab test that is abnormal or we need to change your treatment, we will call you to review the results.  Your Pre-procedure COVID-19 Testing will be done on Wednesday Oct. 7 at 11:40 am at [facility], [place]. After your swab you will be given a mask to wear and instructed to go home and quarantine/no visitors until after your procedure. If you test positive you will be notified and your procedure will be cancelled.    Testing/Procedures: Your physician has requested that you have an echocardiogram in 1 month. Echocardiography is a painless test that uses sound waves to create images of your heart. It provides your doctor with information about the size and shape of your heart and how well your heart's chambers and valves are working. This procedure takes approximately one hour. There are no restrictions for this procedure.    You are scheduled for a Cardioversion on Friday October 9 with Dr. Sallyanne Kuster.  Please arrive at the [facility] (Main Entrance A) at [facility]: [address] [place], [place] [address] at 7:30 am. (1 hour prior to procedure unless lab work is needed; if lab work is needed arrive 1.5 hours ahead)  DIET: Nothing to eat or drink after midnight except a sip of water with medications (see medication instructions below)  Medication Instructions: Hold Lasix (Furosemide)  Continue your anticoagulant: Eliquis  You will need  to continue your anticoagulant after your procedure until  you are told by your provider that it is safe to stop    You must have a responsible person to drive you home and stay in the waiting area during your procedure. Failure to do so could result in cancellation.  Bring your insurance cards.  *Special Note: Every effort is made to have your procedure done on time. Occasionally there are emergencies that occur at the hospital that may cause delays. Please be patient if a delay does occur.    Follow-Up: At [facility], you and your health needs are our priority.  As part of our continuing mission to provide you with exceptional heart care, we have created designated Provider Care Teams.  These Care Teams include your primary Cardiologist (physician) and Advanced Practice Providers (APPs -  Physician Assistants and Nurse Practitioners) who all work together to provide you with the care you need, when you need it. You will need a follow up appointment in:  6 months.  Please call our office 2 months in advance to schedule this appointment.  You may see Dr. Acie Fredrickson or one of the following Advanced Practice Providers on your designated Care Team: Richardson Dopp, PA-C [place], [place] . Daune Perch, NP

## 2019-09-19 LAB — CBC
Hematocrit: 46.7 % (ref 37.5–51.0)
Hemoglobin: 15.5 g/dL (ref 13.0–17.7)
MCH: 28.4 pg (ref 26.6–33.0)
MCHC: 33.2 g/dL (ref 31.5–35.7)
MCV: 86 fL (ref 79–97)
Platelets: 276 10*3/uL (ref 150–450)
RBC: 5.45 x10E6/uL (ref 4.14–5.80)
RDW: 14 % (ref 11.6–15.4)
WBC: 6.9 10*3/uL (ref 3.4–10.8)

## 2019-09-19 LAB — BASIC METABOLIC PANEL
BUN/Creatinine Ratio: 12 (ref 10–24)
BUN: 19 mg/dL (ref 8–27)
CO2: 25 mmol/L (ref 20–29)
Calcium: 9.4 mg/dL (ref 8.6–10.2)
Chloride: 97 mmol/L (ref 96–106)
Creatinine, Ser: 1.54 mg/dL — ABNORMAL HIGH (ref 0.76–1.27)
GFR calc Af Amer: 54 mL/min/{1.73_m2} — ABNORMAL LOW (ref >59)
GFR calc non Af Amer: 47 mL/min/{1.73_m2} — ABNORMAL LOW (ref >59)
Glucose: 122 mg/dL — ABNORMAL HIGH (ref 65–99)
Potassium: 4.8 mmol/L (ref 3.5–5.2)
Sodium: 138 mmol/L (ref 134–144)

## 2019-09-24 ENCOUNTER — Other Ambulatory Visit (HOSPITAL_COMMUNITY)
Admission: RE | Admit: 2019-09-24 | Discharge: 2019-09-24 | Disposition: A | Discharge status: Home / Self Care | Payer: 59 | Source: Ambulatory Visit | Attending: Cardiovascular Disease | Admitting: Cardiovascular Disease

## 2019-09-24 DIAGNOSIS — Z01812 Encounter for preprocedural laboratory examination: Secondary | ICD-10-CM | POA: Diagnosis present

## 2019-09-24 DIAGNOSIS — Z20828 Contact with and (suspected) exposure to other viral communicable diseases: Secondary | ICD-10-CM | POA: Diagnosis not present

## 2019-09-24 LAB — SARS CORONAVIRUS 2 (TAT 6-24 HRS): SARS Coronavirus 2: NEGATIVE

## 2019-09-25 ENCOUNTER — Encounter (HOSPITAL_COMMUNITY): Payer: Self-pay | Admitting: Anesthesiology

## 2019-09-25 ENCOUNTER — Other Ambulatory Visit: Payer: Self-pay | Admitting: Cardiovascular Disease

## 2019-09-25 NOTE — Anesthesia Preprocedure Evaluation (Deleted)
Anesthesia Evaluation    Reviewed: Allergy & Precautions, Patient's Chart, lab work & pertinent test results  History of Anesthesia Complications Negative for: history of anesthetic complications  Airway        Dental   Pulmonary COPD, Current Smoker,           Cardiovascular hypertension, Pt. on medications + angina + CAD, + Past MI, + CABG (2013) and +CHF  + dysrhythmias Atrial Fibrillation + Valvular Problems/Murmurs MR    '19 TTE - moderate LVH. EF 30% to 35%. Diffuse hypokinesis. Moderate MR. LA was moderately dilated. RA was moderately to severely dilated. PASP moderately increased, 42 mm Hg    Neuro/Psych PSYCHIATRIC DISORDERS Depression negative neurological ROS     GI/Hepatic Neg liver ROS, GERD  Medicated,  Endo/Other  diabetes, Type 2, Oral Hypoglycemic AgentsHypothyroidism   Renal/GU Renal InsufficiencyRenal disease     Musculoskeletal negative musculoskeletal ROS (+)   Abdominal   Peds  Hematology negative hematology ROS (+)   Anesthesia Other Findings   Reproductive/Obstetrics                             Anesthesia Physical Anesthesia Plan  ASA: III  Anesthesia Plan: General   Post-op Pain Management:    Induction: Intravenous  PONV Risk Score and Plan: 1 and Treatment may vary due to age or medical condition and Propofol infusion  Airway Management Planned: Mask and Natural Airway  Additional Equipment: None  Intra-op Plan:   Post-operative Plan:   Informed Consent:   Plan Discussed with: CRNA and Anesthesiologist  Anesthesia Plan Comments:         Anesthesia Quick Evaluation

## 2019-09-26 ENCOUNTER — Encounter (HOSPITAL_COMMUNITY): Admission: RE | Payer: Self-pay | Source: Home / Self Care

## 2019-09-26 ENCOUNTER — Ambulatory Visit (HOSPITAL_COMMUNITY): Admission: RE | Admit: 2019-09-26 | Payer: 59 | Source: Home / Self Care | Admitting: Cardiovascular Disease

## 2019-09-26 ENCOUNTER — Telehealth: Payer: Self-pay | Admitting: Cardiovascular Disease

## 2019-09-26 SURGERY — CARDIOVERSION
Anesthesia: General

## 2019-09-26 NOTE — Telephone Encounter (Signed)
° ° °  Please call to reschedule cardioversion

## 2019-09-26 NOTE — Telephone Encounter (Signed)
I spoke to the patient who called because he was scheduled for a Cardioversion on 10/9, but had to cancel.  His responsible party to drive him here was sick.  I told him that Dr Elmarie Shiley nurse would reach out to him on 10/12, reschedule his Cardioversion and review instructions.  He verbalized understanding.

## 2019-09-26 NOTE — Progress Notes (Signed)
Called to check on patient because he had not arrived for his 0830 cardioversion. He said he called and left a voicemail on answering service to cancel because his responsible party was sick. I checked voicemail and did not see that he left a message. I instructed him to call his cardiologist office to reschedule.

## 2019-09-29 NOTE — Telephone Encounter (Signed)
Left message for patient to call back to reschedule cardioversion

## 2019-10-01 ENCOUNTER — Telehealth: Payer: Self-pay | Admitting: Cardiovascular Disease

## 2019-10-01 DIAGNOSIS — I4819 Other persistent atrial fibrillation: Secondary | ICD-10-CM

## 2019-10-01 DIAGNOSIS — I5042 Chronic combined systolic (congestive) and diastolic (congestive) heart failure: Secondary | ICD-10-CM

## 2019-10-01 DIAGNOSIS — Z7901 Long term (current) use of anticoagulants: Secondary | ICD-10-CM

## 2019-10-01 NOTE — Telephone Encounter (Signed)
New message      Calling to reschedule cardioversion

## 2019-10-01 NOTE — Telephone Encounter (Signed)
Spoke with patient and rescheduled cardioversion according to his request. He is aware of all instructions and thanked me for my help.   You are scheduled for a Cardioversionon Friday October 23 with Dr. Debara Pickett.  Please arrive at the [facility] (Main Entrance A) at [facility]: [address] [place], [place] [address] at 12 pm.  DIET: Nothing to eat or drink after midnight except a sip of water with medications (see medication instructions below)  Medication Instructions: Hold Lasix (furosemide)  Continue your anticoagulant: Eliquis You will need to continue your anticoagulant after your procedure until you  are told by your Provider that it is safe to stop   Labs:  Come to: the lab at [facility] between the hours of 8:00 am and 4:30 pm on Wednesday Oct. 21. You do not have to be fasting.  Your Pre-procedure COVID-19 Testing will be done on Wednesday Oct. 21 at 8:00 am at [facility], [address], [place]. After your swab you will be given a mask to wear and instructed to go home and quarantine/no visitors until after your procedure. If you test positive you will be notified and your procedure will be cancelled.    You must have a responsible person to drive you home and stay in the waiting area during your procedure. Failure to do so could result in cancellation.  Bring your insurance cards.  *Special Note: Every effort is made to have your procedure done on time. Occasionally there are emergencies that occur at the hospital that may cause delays. Please be patient if a delay does occur.

## 2019-10-08 ENCOUNTER — Other Ambulatory Visit: Payer: Self-pay

## 2019-10-08 ENCOUNTER — Other Ambulatory Visit (HOSPITAL_COMMUNITY)
Admission: RE | Admit: 2019-10-08 | Discharge: 2019-10-08 | Disposition: A | Discharge status: Home / Self Care | Payer: 59 | Source: Ambulatory Visit | Attending: Internal Medicine | Admitting: Internal Medicine

## 2019-10-08 ENCOUNTER — Other Ambulatory Visit: Payer: 59

## 2019-10-08 ENCOUNTER — Other Ambulatory Visit: Payer: Self-pay | Admitting: Internal Medicine

## 2019-10-08 DIAGNOSIS — I10 Essential (primary) hypertension: Secondary | ICD-10-CM

## 2019-10-08 DIAGNOSIS — Z7901 Long term (current) use of anticoagulants: Secondary | ICD-10-CM

## 2019-10-08 DIAGNOSIS — Z20828 Contact with and (suspected) exposure to other viral communicable diseases: Secondary | ICD-10-CM | POA: Insufficient documentation

## 2019-10-08 DIAGNOSIS — I509 Heart failure, unspecified: Secondary | ICD-10-CM

## 2019-10-08 DIAGNOSIS — I5042 Chronic combined systolic (congestive) and diastolic (congestive) heart failure: Secondary | ICD-10-CM

## 2019-10-08 DIAGNOSIS — I4819 Other persistent atrial fibrillation: Secondary | ICD-10-CM

## 2019-10-08 DIAGNOSIS — Z01812 Encounter for preprocedural laboratory examination: Secondary | ICD-10-CM | POA: Diagnosis present

## 2019-10-08 LAB — CBC
Hematocrit: 44.9 % (ref 37.5–51.0)
Hemoglobin: 14.8 g/dL (ref 13.0–17.7)
MCH: 28.9 pg (ref 26.6–33.0)
MCHC: 33 g/dL (ref 31.5–35.7)
MCV: 88 fL (ref 79–97)
Platelets: 219 10*3/uL (ref 150–450)
RBC: 5.12 x10E6/uL (ref 4.14–5.80)
RDW: 14.2 % (ref 11.6–15.4)
WBC: 6.4 10*3/uL (ref 3.4–10.8)

## 2019-10-08 LAB — BASIC METABOLIC PANEL
BUN/Creatinine Ratio: 11 (ref 10–24)
BUN: 17 mg/dL (ref 8–27)
CO2: 28 mmol/L (ref 20–29)
Calcium: 9.1 mg/dL (ref 8.6–10.2)
Chloride: 94 mmol/L — ABNORMAL LOW (ref 96–106)
Creatinine, Ser: 1.51 mg/dL — ABNORMAL HIGH (ref 0.76–1.27)
GFR calc Af Amer: 55 mL/min/{1.73_m2} — ABNORMAL LOW (ref >59)
GFR calc non Af Amer: 48 mL/min/{1.73_m2} — ABNORMAL LOW (ref >59)
Glucose: 90 mg/dL (ref 65–99)
Potassium: 4.7 mmol/L (ref 3.5–5.2)
Sodium: 136 mmol/L (ref 134–144)

## 2019-10-08 LAB — SARS CORONAVIRUS 2 (TAT 6-24 HRS): SARS Coronavirus 2: NEGATIVE

## 2019-10-10 ENCOUNTER — Other Ambulatory Visit: Payer: Self-pay

## 2019-10-10 ENCOUNTER — Ambulatory Visit (HOSPITAL_COMMUNITY): Payer: 59 | Admitting: Certified Registered Nurse Anesthetist

## 2019-10-10 ENCOUNTER — Encounter (HOSPITAL_COMMUNITY): Payer: Self-pay | Admitting: *Deleted

## 2019-10-10 ENCOUNTER — Encounter (HOSPITAL_COMMUNITY)
Admission: RE | Disposition: A | Discharge status: Home / Self Care | Payer: Self-pay | Source: Home / Self Care | Attending: Internal Medicine

## 2019-10-10 ENCOUNTER — Ambulatory Visit (HOSPITAL_COMMUNITY)
Admission: RE | Admit: 2019-10-10 | Discharge: 2019-10-10 | Disposition: A | Discharge status: Home / Self Care | Payer: 59 | Attending: Internal Medicine | Admitting: Internal Medicine

## 2019-10-10 DIAGNOSIS — Z7901 Long term (current) use of anticoagulants: Secondary | ICD-10-CM | POA: Insufficient documentation

## 2019-10-10 DIAGNOSIS — F17210 Nicotine dependence, cigarettes, uncomplicated: Secondary | ICD-10-CM | POA: Insufficient documentation

## 2019-10-10 DIAGNOSIS — E669 Obesity, unspecified: Secondary | ICD-10-CM | POA: Diagnosis not present

## 2019-10-10 DIAGNOSIS — Z6831 Body mass index (BMI) 31.0-31.9, adult: Secondary | ICD-10-CM | POA: Diagnosis not present

## 2019-10-10 DIAGNOSIS — F329 Major depressive disorder, single episode, unspecified: Secondary | ICD-10-CM | POA: Insufficient documentation

## 2019-10-10 DIAGNOSIS — K219 Gastro-esophageal reflux disease without esophagitis: Secondary | ICD-10-CM | POA: Diagnosis not present

## 2019-10-10 DIAGNOSIS — I5042 Chronic combined systolic (congestive) and diastolic (congestive) heart failure: Secondary | ICD-10-CM | POA: Diagnosis not present

## 2019-10-10 DIAGNOSIS — Z7984 Long term (current) use of oral hypoglycemic drugs: Secondary | ICD-10-CM | POA: Diagnosis not present

## 2019-10-10 DIAGNOSIS — J449 Chronic obstructive pulmonary disease, unspecified: Secondary | ICD-10-CM | POA: Diagnosis not present

## 2019-10-10 DIAGNOSIS — Z79899 Other long term (current) drug therapy: Secondary | ICD-10-CM | POA: Insufficient documentation

## 2019-10-10 DIAGNOSIS — Z7982 Long term (current) use of aspirin: Secondary | ICD-10-CM | POA: Insufficient documentation

## 2019-10-10 DIAGNOSIS — E785 Hyperlipidemia, unspecified: Secondary | ICD-10-CM | POA: Diagnosis not present

## 2019-10-10 DIAGNOSIS — I110 Hypertensive heart disease with heart failure: Secondary | ICD-10-CM | POA: Diagnosis not present

## 2019-10-10 DIAGNOSIS — R7303 Prediabetes: Secondary | ICD-10-CM | POA: Insufficient documentation

## 2019-10-10 DIAGNOSIS — E039 Hypothyroidism, unspecified: Secondary | ICD-10-CM | POA: Insufficient documentation

## 2019-10-10 DIAGNOSIS — I4819 Other persistent atrial fibrillation: Secondary | ICD-10-CM | POA: Diagnosis not present

## 2019-10-10 DIAGNOSIS — Z951 Presence of aortocoronary bypass graft: Secondary | ICD-10-CM | POA: Diagnosis not present

## 2019-10-10 DIAGNOSIS — I252 Old myocardial infarction: Secondary | ICD-10-CM | POA: Insufficient documentation

## 2019-10-10 DIAGNOSIS — I2510 Atherosclerotic heart disease of native coronary artery without angina pectoris: Secondary | ICD-10-CM | POA: Insufficient documentation

## 2019-10-10 DIAGNOSIS — Z79890 Hormone replacement therapy (postmenopausal): Secondary | ICD-10-CM | POA: Insufficient documentation

## 2019-10-10 HISTORY — PX: CARDIOVERSION: SHX1299

## 2019-10-10 LAB — GLUCOSE, CAPILLARY: Glucose-Capillary: 109 mg/dL — ABNORMAL HIGH (ref 70–99)

## 2019-10-10 SURGERY — CARDIOVERSION
Anesthesia: General

## 2019-10-10 MED ORDER — LIDOCAINE 2% (20 MG/ML) 5 ML SYRINGE
INTRAMUSCULAR | Status: DC | PRN
  Administered 2019-10-10: 40 mg via INTRAVENOUS

## 2019-10-10 MED ORDER — SODIUM CHLORIDE 0.9 % IV SOLN
INTRAVENOUS | Status: AC | PRN
  Administered 2019-10-10: 500 mL via INTRAMUSCULAR

## 2019-10-10 MED ORDER — PROPOFOL 10 MG/ML IV BOLUS
INTRAVENOUS | Status: DC | PRN
  Administered 2019-10-10 (×2): 20 mg via INTRAVENOUS
  Administered 2019-10-10: 60 mg via INTRAVENOUS

## 2019-10-10 NOTE — H&P (Signed)
    INTERVAL PROCEDURE H&P  History and Physical Interval Note:  10/10/2019 1:08 PM  Curtis Perez has presented today for their planned procedure. The various methods of treatment have been discussed with the patient and family. After consideration of risks, benefits and other options for treatment, the patient has consented to the procedure.  The patients' outpatient history has been reviewed, patient examined, and no change in status from most recent office note within the past 30 days. I have reviewed the patients' chart and labs and will proceed as planned. Questions were answered to the patient's satisfaction.   Curtis Casino, MD, Curtis Perez, Curtis Perez Director of the Advanced Lipid Disorders &  Cardiovascular Risk Reduction Clinic Diplomate of the American Board of Clinical Lipidology Attending Cardiologist  Direct Dial: [phone]  Fax: [phone]  Website:  [email or web address] Curtis Perez 10/10/2019, 1:08 PM

## 2019-10-10 NOTE — Discharge Instructions (Signed)

## 2019-10-10 NOTE — Anesthesia Procedure Notes (Signed)
Date/Time: 10/10/2019 1:21 PM Performed by: Janene Harvey, CRNA Pre-anesthesia Checklist: Patient identified, Emergency Drugs available, Suction available and Patient being monitored Patient Re-evaluated:Patient Re-evaluated prior to induction Oxygen Delivery Method: Ambu bag Dental Injury: Teeth and Oropharynx as per pre-operative assessment

## 2019-10-10 NOTE — Anesthesia Postprocedure Evaluation (Signed)
Anesthesia Post Note  Patient: Curtis Perez  Procedure(s) Performed: CARDIOVERSION (N/A )     Patient location during evaluation: Endoscopy Anesthesia Type: General Level of consciousness: awake and alert and oriented Pain management: pain level controlled Vital Signs Assessment: post-procedure vital signs reviewed and stable Respiratory status: spontaneous breathing, nonlabored ventilation and respiratory function stable Cardiovascular status: blood pressure returned to baseline and stable Postop Assessment: no apparent nausea or vomiting Anesthetic complications: no    Last Vitals:  Vitals:   10/10/19 1220 10/10/19 1330  BP: 94/68 (!) 97/45  Pulse: 73 (!) 50  Resp: 17 19  SpO2: 96% 99%    Last Pain:  Vitals:   10/10/19 1330  TempSrc: Oral  PainSc:                  Jaiah Weigel A.

## 2019-10-10 NOTE — Anesthesia Preprocedure Evaluation (Signed)
Anesthesia Evaluation  Patient identified by MRN, date of birth, ID band Patient awake    Reviewed: Allergy & Precautions, NPO status , Patient's Chart, lab work & pertinent test results, reviewed documented beta blocker date and time   Airway Mallampati: II  TM Distance: >3 FB Neck ROM: Full    Dental  (+) Poor Dentition, Dental Advisory Given   Pulmonary COPD, Current SmokerPatient did not abstain from smoking.,     + decreased breath sounds      Cardiovascular hypertension, Pt. on medications and Pt. on home beta blockers + angina with exertion + CAD, + Past MI, + CABG and +CHF  + dysrhythmias Atrial Fibrillation  Rhythm:Irregular Rate:Normal  CABG x 3v 2013   Neuro/Psych PSYCHIATRIC DISORDERS Depression negative neurological ROS     GI/Hepatic Neg liver ROS, GERD  Medicated and Controlled,  Endo/Other  diabetes, Well Controlled, Type 2, Oral Hypoglycemic AgentsHypothyroidism Hyperlipidemia  Renal/GU negative Renal ROS  negative genitourinary   Musculoskeletal negative musculoskeletal ROS (+)   Abdominal   Peds  Hematology Eliquis therapy- last dose this am   Anesthesia Other Findings   Reproductive/Obstetrics                             Anesthesia Physical Anesthesia Plan  ASA: III  Anesthesia Plan: General   Post-op Pain Management:    Induction: Intravenous  PONV Risk Score and Plan: 1 and Treatment may vary due to age or medical condition  Airway Management Planned: Mask and Natural Airway  Additional Equipment:   Intra-op Plan:   Post-operative Plan:   Informed Consent: I have reviewed the patients History and Physical, chart, labs and discussed the procedure including the risks, benefits and alternatives for the proposed anesthesia with the patient or authorized representative who has indicated his/her understanding and acceptance.     Dental advisory given  Plan  Discussed with: Surgeon and CRNA  Anesthesia Plan Comments:         Anesthesia Quick Evaluation

## 2019-10-10 NOTE — CV Procedure (Signed)
   CARDIOVERSION NOTE  Procedure: Electrical Cardioversion Indications:  Atrial Fibrillation  Procedure Details:  Consent: Risks of procedure as well as the alternatives and risks of each were explained to the (patient/caregiver).  Consent for procedure obtained.  Time Out: Verified patient identification, verified procedure, site/side was marked, verified correct patient position, special equipment/implants available, medications/allergies/relevent history reviewed, required imaging and test results available.  Performed  Patient placed on cardiac monitor, pulse oximetry, supplemental oxygen as necessary.  Sedation given: propofol per anesthesia Pacer pads placed anterior and posterior chest.  Cardioverted 2 time(s).  Cardioverted at 150J and 200J biphasic.  Impression: Findings: Post procedure EKG shows: NSR Complications: None Patient did tolerate procedure well.  Plan: 1. Successful DCCV to NSR with 2 stacked shocks.  Time Spent Directly with the Patient:  45 minutes   Pixie Casino, MD, [facility], [place] Director of the Advanced Lipid Disorders &  Cardiovascular Risk Reduction Clinic Diplomate of the American Board of Clinical Lipidology Attending Cardiologist  Direct Dial: [phone]  Fax: [phone]  Website:  [email or web address] 10/10/2019, 1:29 PM

## 2019-10-10 NOTE — Transfer of Care (Signed)
Immediate Anesthesia Transfer of Care Note  Patient: Curtis Perez  Procedure(s) Performed: CARDIOVERSION (N/A )  Patient Location: Endoscopy Unit  Anesthesia Type:General  Level of Consciousness: drowsy  Airway & Oxygen Therapy: Patient Spontanous Breathing and Patient connected to nasal cannula oxygen  Post-op Assessment: Report given to RN and Post -op Vital signs reviewed and stable  Post vital signs: Reviewed  Last Vitals:  Vitals Value Taken Time  BP    Temp    Pulse    Resp    SpO2      Last Pain:  Vitals:   10/10/19 1220  TempSrc: Temporal  PainSc: 0-No pain         Complications: No apparent anesthesia complications

## 2019-10-12 ENCOUNTER — Encounter (HOSPITAL_COMMUNITY): Payer: Self-pay | Admitting: Internal Medicine

## 2019-10-22 ENCOUNTER — Other Ambulatory Visit: Payer: Self-pay

## 2019-10-22 ENCOUNTER — Ambulatory Visit (HOSPITAL_COMMUNITY): Payer: 59 | Attending: Cardiovascular Disease

## 2019-10-22 DIAGNOSIS — I5042 Chronic combined systolic (congestive) and diastolic (congestive) heart failure: Secondary | ICD-10-CM | POA: Diagnosis not present

## 2019-10-22 DIAGNOSIS — I4819 Other persistent atrial fibrillation: Secondary | ICD-10-CM | POA: Diagnosis present

## 2019-10-23 ENCOUNTER — Telehealth: Payer: Self-pay | Admitting: Cardiovascular Disease

## 2019-10-23 ENCOUNTER — Other Ambulatory Visit: Payer: Self-pay | Admitting: Cardiovascular Disease

## 2019-10-23 NOTE — Telephone Encounter (Signed)
Follow Up:     Pt is returning Michelle's call from yesterday, concerning  His Echo results.

## 2019-10-23 NOTE — Telephone Encounter (Signed)
The patient has been notified of the result and verbalized understanding. Appt made with Vin Bhagat on 11/10 at 11:00 AM. All questions (if any) were answered. Cleon Gustin, RN 10/23/2019 11:02 AM

## 2019-10-23 NOTE — Telephone Encounter (Signed)
-----   Message from Thayer Headings, MD sent at 10/22/2019  1:07 PM EST ----- LV function is unchanged from previous echo last year. He needs a follow up visit with APP for visit after cardioversion and to continue to titrate his CHF meds .

## 2019-10-27 NOTE — Progress Notes (Signed)
Cardiology Office Note    Date:  10/28/2019   ID:  Zadyn, Alberda 03/26/1951, MRN [id number]  PCP:  Unk Pinto, MD  Cardiologist:  Dr. Acie Fredrickson  Chief Complaint: cardioversion follow up  History of Present Illness:   Curtis Perez is a 68 y.o. male with hx of CAD s/p CABG in 2013, HTN, HLD, chronic systolic CHF, CKD stage III, tobacco smoking and PAF seen for follow up.   Admitted 10/2017 with NSTEMI. Cath 10/2017 revealed 99% stenosis of the circumflex and occlusion of the SVG to OM and SVG to RCA. DES was placed in the proximal circumflex, the distal circumflex was 100% stenosed, not amendable to balloon angioplasty.  Hospitalized in 10/2018 with afib.  Plan was for outpatient cardioversion however did not followed up until 09/18/2019. Underwent successful cardioversion on 10/10/2019.  Last echo 10/22/2019 showed persistent LVEF of 30-35%.  Plan to uptitrate heart failure medication.  Here today for follow-up.  He smokes 1 pack a day.  He denies chest pain, shortness of breath, orthopnea, PND, syncope, lower extremity edema, melena or syncope.  Compliant with his medication.  Past Medical History:  Diagnosis Date  . Acute inferior myocardial infarction ([place]) 10/24/2017  . Adenomatous colon polyp 02/15/2009  . Bradycardia   . CAD (coronary artery disease)    remote MI in 2004 with PCI; s/p CABG x 3 03/2012  . CHF (congestive heart failure) ([place])   . COPD (chronic obstructive pulmonary disease) ([place])   . Depression   . Family history of malignant neoplasm of gastrointestinal tract   . GERD (gastroesophageal reflux disease)   . Hyperlipidemia   . Hypertension   . Hypothyroidism   . Myocardial infarction ([place])    2004  . Obesity   . Prediabetes   . 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 Tobacco abuse 03/28/2012  . Vitamin D deficiency     Past Surgical History:  Procedure Laterality Date  . BACK SURGERY    .  CARDIOVERSION N/A 10/10/2019   Procedure: CARDIOVERSION;  Surgeon: Pixie Casino, MD;  Location: [facility];  Service: Cardiovascular;  Laterality: N/A;  . COLONOSCOPY  02/15/2009  . CORONARY ANGIOPLASTY WITH STENT PLACEMENT     about 8 years ago at [facility]  . CORONARY ARTERY BYPASS GRAFT  04/02/2012   Procedure: CORONARY ARTERY BYPASS GRAFTING (CABG);  Surgeon: Rexene Alberts, MD;  Location: [facility];  Service: Open Heart Surgery;  Laterality: N/A;  On pump, times three graphs using endoscopically harvested right greater saphenous vein and left internal mammary artery.   [facility] ACUTE MI REVASCULARIZATION N/A 10/23/2017   Procedure: Coronary/Graft Acute MI Revascularization;  Surgeon: Jettie Booze, MD;  Location: [facility];  Service: Cardiovascular;  Laterality: N/A;  . KNEE SURGERY    . LEFT HEART CATH AND CORS/GRAFTS ANGIOGRAPHY N/A 10/23/2017   Procedure: LEFT HEART CATH AND CORS/GRAFTS ANGIOGRAPHY;  Surgeon: Jettie Booze, MD;  Location: [facility];  Service: Cardiovascular;  Laterality: N/A;  . TONSILLECTOMY      Current Medications: Prior to Admission medications   Medication Sig Start Date End Date Taking? Authorizing Provider  aspirin 81 MG chewable tablet Chew 1 tablet (81 mg total) daily by mouth. 10/27/17   Cheryln Manly, NP  ELIQUIS 5 MG TABS tablet TAKE 1 TABLET BY MOUTH TWICE A DAY Patient taking differently:  Take 5 mg by mouth 2 (two) times daily.  07/23/19   Nahser, Wonda Cheng, MD  ENTRESTO 24-26 MG TAKE 1 TABLET BY MOUTH TWICE A DAY 10/23/19   Nahser, Wonda Cheng, MD  ezetimibe (ZETIA) 10 MG tablet Take 1 tablet Daily for Cholesterol Patient taking differently: Take 10 mg by mouth daily. Take 1 tablet Daily for Cholesterol 05/17/19   Unk Pinto, MD  ferrous sulfate 325 (65 FE) MG tablet Take 325 mg by mouth every Monday, Wednesday, and Friday.    [provider]  FLUoxetine (PROZAC) 40 MG capsule Take 1 capsule  Daily for Mood Patient taking differently: Take 40 mg by mouth daily. Take 1 capsule Daily for Mood 05/17/19   Unk Pinto, MD  furosemide (LASIX) 40 MG tablet TAKE 1 TABLET DAILY FOR BP & FLUID 10/09/19   Unk Pinto, MD  isosorbide mononitrate (IMDUR) 30 MG 24 hr tablet Take 1 tablet Daily for Heart Patient taking differently: Take 30 mg by mouth daily.  07/12/19   Unk Pinto, MD  levothyroxine (SYNTHROID) 100 MCG tablet Take 1 tablet daily on an empty stomach with only water for 30 minutes & no Antacid meds, Calcium or Magnesium for 4 hours & avoid Biotin Patient taking differently: Take 100 mcg by mouth daily before breakfast. Take 1 tablet daily on an empty stomach with only water for 30 minutes & no Antacid meds, Calcium or Magnesium for 4 hours & avoid Biotin 05/17/19   Unk Pinto, MD  magnesium oxide (MAG-OX) 400 MG tablet Take 400 mg by mouth daily.    [provider]  metFORMIN (GLUCOPHAGE-XR) 500 MG 24 hr tablet Take 2 tablets 2 x/day with meals for Diabetes Patient taking differently: Take 1,000 mg by mouth 2 (two) times daily.  05/17/19   Unk Pinto, MD  metoprolol tartrate (LOPRESSOR) 25 MG tablet Take 1 tablet 2 x /day for BP & Heart Patient taking differently: Take 25 mg by mouth 2 (two) times daily. Take 1 tablet 2 x /day for BP & Heart 05/17/19   Unk Pinto, MD  nitroGLYCERIN (NITROSTAT) 0.4 MG SL tablet Place 1 tablet (0.4 mg total) every 5 (five) minutes as needed under the tongue. Patient taking differently: Place 0.4 mg under the tongue every 5 (five) minutes as needed for chest pain.  10/26/17   Cheryln Manly, NP  Omega-3 Fatty Acids (FISH OIL) 1000 MG CAPS Take 2,000 mg by mouth daily.    [provider]  omeprazole (PRILOSEC) 40 MG capsule Take 1 capsule Daily for Acid Indigestion & Reflux Patient taking differently: Take 40 mg by mouth daily.  06/25/19   Unk Pinto, MD  potassium chloride SA (K-DUR,KLOR-CON) 20 MEQ  tablet Take 20 mEq by mouth daily.     [provider]  rosuvastatin (CRESTOR) 40 MG tablet Take 1 tablet Daily for Cholesterol Patient taking differently: Take 40 mg by mouth daily.  07/12/19   Unk Pinto, MD  vitamin B-12 (CYANOCOBALAMIN) 1000 MCG tablet Take 1,000 mcg by mouth daily.    [provider]  Vitamin D, Ergocalciferol, (DRISDOL) 50000 UNITS CAPS capsule Take 1 capsule (50,000 Units total) by mouth 3 (three) times a week. Patient taking differently: Take 50,000 Units by mouth every Wednesday.  09/23/15   Rolene Course, PA-C    Allergies:   Lisinopril   Social History   Socioeconomic History  . Marital status: Divorced    Spouse name: Not on file  . Number of children: 0  . Years  of education: Not on file  . Highest education level: Not on file  Occupational History  . Occupation: Disability  . Occupation: Counsellor for [employer]  . Financial resource strain: Not very hard  . Food insecurity    Worry: Never true    Inability: Never true  . Transportation needs    Medical: No    Non-medical: No  Tobacco Use  . Smoking status: Current Every Day Smoker    Packs/day: 2.00    Years: 40.00    Pack years: 80.00    Types: Cigarettes  . Smokeless tobacco: Never Used  . Tobacco comment: down from 2ppd  Substance and Sexual Activity  . Alcohol use: No    Alcohol/week: 2.0 standard drinks    Types: 1 Glasses of wine, 1 Shots of liquor per week    Comment: Rarely  . Drug use: No  . Sexual activity: Not Currently  Lifestyle  . Physical activity    Days per week: Patient refused    Minutes per session: Patient refused  . Stress: Patient refused  Relationships  . Social Herbalist on phone: Patient refused    Gets together: Patient refused    Attends religious service: Patient refused    Active member of club or organization: Patient refused    Attends meetings of clubs or organizations: Patient refused     Relationship status: Patient refused  Other Topics Concern  . Not on file  Social History Narrative  . Not on file     Family History:  The patient's family history includes Alzheimer's disease in his mother; Colon cancer in his father and paternal grandmother; Hypertension in his mother.   ROS:   Please see the history of present illness.    ROS All other systems reviewed and are negative.   PHYSICAL EXAM:   VS:  BP 130/88   Pulse (!) 56   Ht 6\' 3"  (1.905 m)   Wt 260 lb (117.9 kg)   SpO2 96%   BMI 32.50 kg/m    GEN: Well nourished, well developed, in no acute distress  HEENT: normal  Neck: no JVD, carotid bruits, or masses Cardiac:RRR; no murmurs, rubs, or gallops,no edema  Respiratory:  clear to auscultation bilaterally, normal work of breathing GI: soft, nontender, nondistended, + BS MS: no deformity or atrophy  Skin: warm and dry, no rash Neuro:  Alert and Oriented x 3, Strength and sensation are intact Psych: euthymic mood, full affect  Wt Readings from Last 3 Encounters:  10/28/19 260 lb (117.9 kg)  10/10/19 250 lb (113.4 kg)  09/18/19 259 lb 12.8 oz (117.8 kg)      Studies/Labs Reviewed:   EKG:  EKG is ordered today.  The ekg ordered today demonstrates sinus bradycardia with rate of 56 bpm  Recent Labs: 08/11/2019: ALT 8; Magnesium 1.9; TSH 1.22 10/08/2019: BUN 17; Creatinine, Ser 1.51; Hemoglobin 14.8; Platelets 219; Potassium 4.7; Sodium 136   Lipid Panel    Component Value Date/Time   CHOL 208 (H) 08/11/2019 0000   CHOL 92 (L) 02/11/2018 1011   TRIG 113 08/11/2019 0000   HDL 58 08/11/2019 0000   HDL 42 02/11/2018 1011   CHOLHDL 3.6 08/11/2019 0000   VLDL 34 10/24/2017 0247   LDLCALC 128 (H) 08/11/2019 0000    Additional studies/ records that were reviewed today include:   Echocardiogram: 10/22/2019  1. Left ventricular ejection fraction, by visual estimation, is 30 to 35%. The left  ventricle has moderate to severely decreased function. There is  no left ventricular hypertrophy.  2. Moderately dilated left ventricular internal cavity size.  3. Septal apical and inferior wall hypokinesis     GLS low normal -15.8.  4. Global right ventricle has normal systolic function.The right ventricular size is normal. No increase in right ventricular wall thickness.  5. Left atrial size was moderately dilated.  6. Right atrial size was normal.  7. The mitral valve is normal in structure. Mild mitral valve regurgitation.  8. The tricuspid valve is normal in structure. Tricuspid valve regurgitation is mild.  9. The aortic valve is tricuspid. Aortic valve regurgitation is not visualized. Mild aortic valve sclerosis without stenosis. 10. The pulmonic valve was grossly normal. Pulmonic valve regurgitation is mild. 11. The interatrial septum was not well visualized.    Coronary/Graft Acute MI Revascularization  10/2017  LEFT HEART CATH AND CORS/GRAFTS ANGIOGRAPHY  Conclusion    Prox Cx lesion is 99% stenosed. SVG to OM is occluded. Thrombectomy, followed by a drug-eluting stent which was successfully placed using a STENT SYNERGY DES 3.5X20, postdilated to 3.75.  Post intervention, there is a 0% residual stenosis.  Ost 1st Mrg lesion is 70% stenosed. Balloon angioplasty was performed in the ostial vessel before and after circumflex stent placement, using a BALLOON SAPPHIRE 2.5X12.  Post intervention, there is a 20% residual stenosis.  Dist Cx lesion is 100% stenosed. Balloon angioplasty ws attempted but the lesion was too distal in the vessel.  Prox LAD lesion is 90% stenosed. LIMA to LAD is patent.  Prox RCA lesion is 100% stenosed. SVG to RCA is occluded.  LV end diastolic pressure is mildly elevated.  There is no aortic valve stenosis.   Continue angiomax for another hour.  He will need dual antiplatelet therapy for at least a year, along with aggressive secondary prevention.     Diagnostic Dominance: Left  Intervention      ASSESSMENT & PLAN:    1. CAD -No angina. continue aspirin 81 mg, statin, Imdur and beta-blocker.  2. Chronic systolic CHF -Euvolemic.  Recent echo showed LV function of 30 to 35%.  Consolidate Lopressor to long-acting Toprol-XL 50 mg daily.  Continue Lasix at current dose.  Increase Entresto to medium dose.  Recheck  BMET in 2 weeks.  3. HTN -Blood pressure stable.  Medication changes as above.  4. PAF -Maintaining sinus rhythm.  Continue Eliquis for anticoagulation.  No bleeding issue.  5. HLD - 08/11/2019: Cholesterol 208; HDL 58; LDL Cholesterol (Calc) 128; Triglycerides 113  -LDL goal less than 70.  Continue Crestor 40 mg and Zetia 10 mg daily. -Consider lipid panel at follow-up, if LDL not at goal consider lipid clinic evaluation.  6.  Ongoing tobacco smoking -Does not want to quit.   Medication Adjustments/Labs and Tests Ordered: Current medicines are reviewed at length with the patient today.  Concerns regarding medicines are outlined above.  Medication changes, Labs and Tests ordered today are listed in the Patient Instructions below. Patient Instructions  Medication Instructions:  Your physician has recommended you make the following change in your medication: 1.  STOP the Lopressor 2.  START Toprol XL 50 mg taking 1 tablet daily 3.  INCREASE the Entresto to 49/51 taking 1 tablet twice a day  *If you need a refill on your cardiac medications before your next appointment, please call your pharmacy*  Lab Work: 11/12/2019:  Come to the office for BMET  If you have labs (blood work)  drawn today and your tests are completely normal, you will receive your results only by: Marland Kitchen MyChart Message (if you have MyChart) OR . A paper copy in the mail If you have any lab test that is abnormal or we need to change your treatment, we will call you to review the results.  Testing/Procedures: None ordered  Follow-Up: At [facility], you and your health needs are our priority.  As  part of our continuing mission to provide you with exceptional heart care, we have created designated Provider Care Teams.  These Care Teams include your primary Cardiologist (physician) and Advanced Practice Providers (APPs -  Physician Assistants and Nurse Practitioners) who all work together to provide you with the care you need, when you need it.  Your next appointment:   3 months     01/27/2019 ARRIVE AT 10:25 A.M.  The format for your next appointment:   In Person  Provider:   Mertie Moores, MD  Other Instructions      Signed, Leanor Kail, PA  10/28/2019 11:44 AM    [facility] [address], [place], [place]  [address] Phone: [phone]; Fax: [phone]

## 2019-10-28 ENCOUNTER — Other Ambulatory Visit: Payer: Self-pay

## 2019-10-28 ENCOUNTER — Encounter: Payer: Self-pay | Admitting: Physician Assistant

## 2019-10-28 ENCOUNTER — Ambulatory Visit: Payer: 59 | Admitting: Physician Assistant

## 2019-10-28 VITALS — BP 130/88 | HR 56 | Ht 75.0 in | Wt 260.0 lb

## 2019-10-28 DIAGNOSIS — E785 Hyperlipidemia, unspecified: Secondary | ICD-10-CM

## 2019-10-28 DIAGNOSIS — I5022 Chronic systolic (congestive) heart failure: Secondary | ICD-10-CM

## 2019-10-28 DIAGNOSIS — I5042 Chronic combined systolic (congestive) and diastolic (congestive) heart failure: Secondary | ICD-10-CM | POA: Diagnosis not present

## 2019-10-28 DIAGNOSIS — Z720 Tobacco use: Secondary | ICD-10-CM

## 2019-10-28 DIAGNOSIS — I4819 Other persistent atrial fibrillation: Secondary | ICD-10-CM

## 2019-10-28 DIAGNOSIS — I10 Essential (primary) hypertension: Secondary | ICD-10-CM | POA: Diagnosis not present

## 2019-10-28 DIAGNOSIS — E782 Mixed hyperlipidemia: Secondary | ICD-10-CM

## 2019-10-28 MED ORDER — METOPROLOL SUCCINATE ER 50 MG PO TB24: 50.0000 mg | ORAL_TABLET | Freq: Every day | ORAL | 3 refills | Status: DC

## 2019-10-28 MED ORDER — ENTRESTO 49-51 MG PO TABS: 1.0000 | ORAL_TABLET | Freq: Two times a day (BID) | ORAL | 1 refills | Status: DC

## 2019-10-28 NOTE — Patient Instructions (Addendum)
Medication Instructions:  Your physician has recommended you make the following change in your medication: 1.  STOP the Lopressor 2.  START Toprol XL 50 mg taking 1 tablet daily 3.  INCREASE the Entresto to 49/51 taking 1 tablet twice a day  *If you need a refill on your cardiac medications before your next appointment, please call your pharmacy*  Lab Work: 11/12/2019:  Come to the office for BMET  If you have labs (blood work) drawn today and your tests are completely normal, you will receive your results only by: Marland Kitchen MyChart Message (if you have MyChart) OR . A paper copy in the mail If you have any lab test that is abnormal or we need to change your treatment, we will call you to review the results.  Testing/Procedures: None ordered  Follow-Up: At [facility], you and your health needs are our priority.  As part of our continuing mission to provide you with exceptional heart care, we have created designated Provider Care Teams.  These Care Teams include your primary Cardiologist (physician) and Advanced Practice Providers (APPs -  Physician Assistants and Nurse Practitioners) who all work together to provide you with the care you need, when you need it.  Your next appointment:   3 months     01/27/2019 ARRIVE AT 10:25 A.M.  The format for your next appointment:   In Person  Provider:   Mertie Moores, MD  Other Instructions

## 2019-11-05 ENCOUNTER — Encounter: Payer: Self-pay | Admitting: Internal Medicine

## 2019-11-11 ENCOUNTER — Other Ambulatory Visit: Payer: 59

## 2019-11-12 ENCOUNTER — Other Ambulatory Visit: Payer: 59 | Admitting: *Deleted

## 2019-11-12 ENCOUNTER — Other Ambulatory Visit: Payer: Self-pay

## 2019-11-12 LAB — BASIC METABOLIC PANEL
BUN/Creatinine Ratio: 13 (ref 10–24)
BUN: 15 mg/dL (ref 8–27)
CO2: 25 mmol/L (ref 20–29)
Calcium: 9.1 mg/dL (ref 8.6–10.2)
Chloride: 97 mmol/L (ref 96–106)
Creatinine, Ser: 1.2 mg/dL (ref 0.76–1.27)
GFR calc Af Amer: 73 mL/min/{1.73_m2} (ref >59)
GFR calc non Af Amer: 63 mL/min/{1.73_m2} (ref >59)
Glucose: 116 mg/dL — ABNORMAL HIGH (ref 65–99)
Potassium: 4.4 mmol/L (ref 3.5–5.2)
Sodium: 136 mmol/L (ref 134–144)

## 2019-11-15 ENCOUNTER — Encounter: Payer: Self-pay | Admitting: Internal Medicine

## 2019-11-15 NOTE — Progress Notes (Signed)
Annual  Screening/Preventative Visit  & Comprehensive Evaluation & Examination     This very nice [name] presents for a Screening /Preventative Visit & comprehensive evaluation and management of multiple medical co-morbidities.  Patient has been followed for HTN, HLD, T2_NIDDM  and Vitamin D Deficiency. Patient does relate ongoing problems with Depression despite being on Prozac & is amenable to adding Wellbutrin to his regimen.      HTN predates since 2005 when he presented with ACS & had a Stent implanted. Patient's BP has been controlled and today's BP is at goal - 126/74.  In 2013, patient underwent CABG. In 2018, he presented again with a NSTEMI and had DES implanted & was also treated for CHF during that admission.  In Oct 2019, he presented with pAfib & failed CV and was switched from Brilinta to Eliquis.   In Dec 2019, he was hospitalized with CHF (EF 30-35%). On Sep 19, 2019, he had a successful CV by Dr Cathie Olden. On Nov 4th, 2D Echo showed EF 30-35% confirming  chronic systolic CHF.   Patient denies any cardiac symptoms as chest pain, palpitations, shortness of breath, dizziness or ankle swelling. Unfortunately he continues to smoke 2 PPD & is resilient to consider smoking cessation programs.      Patient's hyperlipidemia is not  controlled with diet and medications. Patient denies myalgias or other medication SE's. 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 Patient denies reactive hypoglycemic symptoms, visual blurring, diabetic polys or paresthesias. Her refuses to monitor CBG's. Last A1c was not at goal:  Lab Results  Component Value Date   HGBA1C 6.9 (H) 08/11/2019        Also, patient was discovered Hypothyroid in 2015 and  has been on Thyroid replacement since.     Finally, patient has history of Vitamin D Deficiency ("6" / 2008)  and patient does not supplement as recommended. Patient's last vitamin D was still very low:  Lab Results  Component Value Date   VD25OH 37 08/11/2019   Current Outpatient Medications on File Prior to Visit  Medication Sig  . aspirin 81 MG chewable tablet Chew 1 tablet (81 mg total) daily by mouth.  Arne Cleveland 5 MG TABS tablet TAKE 1 TABLET BY MOUTH TWICE A DAY  . ezetimibe (ZETIA) 10 MG tablet Take 1 tablet Daily for Cholesterol  . ferrous sulfate 325 (65 FE) MG tablet Take 325 mg by mouth every Monday, Wednesday, and Friday.  Marland Kitchen FLUoxetine (PROZAC) 40 MG capsule Take 1 capsule Daily for Mood  . furosemide (LASIX) 40 MG tablet TAKE 1 TABLET DAILY FOR BP & FLUID  . isosorbide mononitrate (IMDUR) 30 MG 24 hr tablet Take 1 tablet Daily for Heart  . levothyroxine (SYNTHROID) 100 MCG tablet Take 1 tablet daily on an empty stomach with only water for 30 minutes & no Antacid meds, Calcium or Magnesium for 4 hours & avoid Biotin  . magnesium oxide (MAG-OX) 400 MG tablet Take 400 mg by mouth daily.  . metFORMIN (GLUCOPHAGE-XR) 500 MG 24 hr tablet Take 2 tablets 2 x/day with meals for Diabetes  . metoprolol succinate (TOPROL-XL) 50 MG 24 hr tablet Take 1 tablet (  50 mg total) by mouth daily. Take with or immediately following a meal.  . nitroGLYCERIN (NITROSTAT) 0.4 MG SL tablet Place 1 tablet (0.4 mg total) every 5 (five) minutes as needed under the tongue.  . Omega-3 Fatty Acids (FISH OIL) 1000 MG CAPS Take 2,000 mg by mouth daily.  Marland Kitchen omeprazole (PRILOSEC) 40 MG capsule Take 1 capsule Daily for Acid Indigestion & Reflux  . potassium chloride SA (K-DUR,KLOR-CON) 20 MEQ tablet Take 20 mEq by mouth daily.   . rosuvastatin (CRESTOR) 40 MG tablet Take 1 tablet Daily for Cholesterol  . sacubitril-valsartan (ENTRESTO) 49-51 MG Take 1 tablet by mouth 2 (two) times daily.  . vitamin B-12  (CYANOCOBALAMIN) 1000 MCG tablet Take 1,000 mcg by mouth daily.  . Vitamin D, Ergocalciferol, (DRISDOL) 50000 UNITS CAPS capsule Take 1 capsule (50,000 Units total) by mouth 3 (three) times a week.   No current facility-administered medications on file prior to visit.    Allergies  Allergen Reactions  . Lisinopril Cough   Past Medical History:  Diagnosis Date  . Acute inferior myocardial infarction ([place]) 10/24/2017  . Adenomatous colon polyp 02/15/2009  . Bradycardia   . CAD (coronary artery disease)    remote MI in 2004 with PCI; s/p CABG x 3 03/2012  . CHF (congestive heart failure) ([place])   . COPD (chronic obstructive pulmonary disease) ([place])   . Depression   . Family history of malignant neoplasm of gastrointestinal tract   . GERD (gastroesophageal reflux disease)   . Hyperlipidemia   . Hypertension   . Hypothyroidism   . Myocardial infarction ([place])    2004  . Obesity   . Prediabetes   . 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 Tobacco abuse 03/28/2012  . Vitamin D deficiency    Health Maintenance  Topic Date Due  . OPHTHALMOLOGY EXAM  12[birth year]04/1961  . FOOT EXAM  08/13/2018  . URINE MICROALBUMIN  08/13/2018  . PNA vac Low Risk Adult (1 of 2 - PCV13) 12/03/2018  . TETANUS/TDAP  01/05/2019  . INFLUENZA VACCINE  07/19/2019  . HEMOGLOBIN A1C  02/11/2020  . COLONOSCOPY  06/25/2020  . Hepatitis C Screening  Completed  . HIV Screening  Completed   Immunization History  Administered Date(s) Administered  . PPD Test 06/15/2014, 07/10/2016  . Pneumococcal Conjugate-13 11/17/2019  . Pneumococcal Polysaccharide-23 01/05/2009, 08/10/2014  . Td 01/05/2009, 11/17/2019  . Zoster 01/03/2016   Last Colon  - 06/25/2017 - Dr Havery Moros - Recc 3  Year f/u Colon - due July 2021  Past Surgical History:  Procedure Laterality Date  . BACK SURGERY    . CARDIOVERSION N/A 10/10/2019   Procedure: CARDIOVERSION;  Surgeon: Pixie Casino,  MD;  Location: [facility];  Service: Cardiovascular;  Laterality: N/A;  . COLONOSCOPY  02/15/2009  . CORONARY ANGIOPLASTY WITH STENT PLACEMENT     about 8 years ago at [facility]  . CORONARY ARTERY BYPASS GRAFT  04/02/2012   Procedure: CORONARY ARTERY BYPASS GRAFTING (CABG);  Surgeon: Rexene Alberts, MD;  Location: [facility];  Service: Open Heart Surgery;  Laterality: N/A;  On pump, times three graphs using endoscopically harvested right greater saphenous vein and left internal mammary artery.   [facility] [place] ACUTE MI REVASCULARIZATION N/A 10/23/2017   Procedure: Coronary/Graft Acute MI Revascularization;  Surgeon: Jettie Booze, MD;  Location: [facility];  Service: Cardiovascular;  Laterality: N/A;  .  KNEE SURGERY    . LEFT HEART CATH AND CORS/GRAFTS ANGIOGRAPHY N/A 10/23/2017   Procedure: LEFT HEART CATH AND CORS/GRAFTS ANGIOGRAPHY;  Surgeon: Jettie Booze, MD;  Location: [facility];  Service: Cardiovascular;  Laterality: N/A;  . TONSILLECTOMY     Family History  Problem Relation Age of Onset  . Hypertension Mother   . Alzheimer's disease Mother   . Colon cancer Father   . Colon cancer Paternal Grandmother   . Esophageal cancer Neg Hx   . Rectal cancer Neg Hx   . Stomach cancer Neg Hx    Social History   Socioeconomic History  . Marital status: Divorced    Spouse name: Not on file  . Number of children: 0  . Years of education: Not on file  . Highest education level: Not on file  Occupational History  . Occupation: Counsellor for [employer]  Tobacco Use  . Smoking status: Current Every Day Smoker    Packs/day: 2.00    Years: 40.00    Pack years: 80.00    Types: Cigarettes  . Smokeless tobacco: Never Used  . Tobacco comment: down from 2ppd  Substance and Sexual Activity  . Alcohol use: No    Alcohol/week: 2.0 standard drinks    Types: 1 Glasses of wine, 1 Shots of liquor per week    Comment: Rarely  . Drug use: No  . Sexual  activity: Not Currently    ROS Constitutional: Denies fever, chills, weight loss/gain, headaches, insomnia,  night sweats or change in appetite. Does c/o fatigue. Eyes: Denies redness, blurred vision, diplopia, discharge, itchy or watery eyes.  ENT: Denies discharge, congestion, post nasal drip, epistaxis, sore throat, earache, hearing loss, dental pain, Tinnitus, Vertigo, Sinus pain or snoring.  Cardio: Denies chest pain, palpitations, irregular heartbeat, syncope, dyspnea, diaphoresis, orthopnea, PND, claudication or edema Respiratory: denies cough, dyspnea, DOE, pleurisy, hoarseness, laryngitis or wheezing.  Gastrointestinal: Denies dysphagia, heartburn, reflux, water brash, pain, cramps, nausea, vomiting, bloating, diarrhea, constipation, hematemesis, melena, hematochezia, jaundice or hemorrhoids Genitourinary: Denies dysuria, frequency, urgency, nocturia, hesitancy, discharge, hematuria or flank pain Musculoskeletal: Denies arthralgia, myalgia, stiffness, Jt. Swelling, pain, limp or strain/sprain. Denies Falls. Skin: Denies puritis, rash, hives, warts, acne, eczema or change in skin lesion Neuro: No weakness, tremor, incoordination, spasms, paresthesia or pain Psychiatric: Denies confusion, memory loss or sensory loss. Denies Depression. Endocrine: Denies change in weight, skin, hair change, nocturia, and paresthesia, diabetic polys, visual blurring or hyper / hypo glycemic episodes.  Heme/Lymph: No excessive bleeding, bruising or enlarged lymph nodes.  Physical Exam  BP 126/74   Pulse (!) 59   Temp (!) 96.3 F (35.7 C)   Ht 6' 1.5" (1.867 m)   Wt 260 lb 3.2 oz (118 kg)   SpO2 97%   BMI 33.86 kg/m   General Appearance: Over  nourished and well groomed and in no apparent distress.  Eyes: PERRLA, EOMs, conjunctiva no swelling or erythema, normal fundi and vessels. Sinuses: No frontal/maxillary tenderness ENT/Mouth: EACs patent / TMs  nl. Nares clear without erythema, swelling,  mucoid exudates. Oral hygiene is poor with muliple teeth missing, remainder carious with gum retracrt. No erythema, swelling, or exudate. Tongue normal, non-obstructing. Tonsils not swollen or erythematous. Hearing normal.  Neck: Supple, thyroid not palpable. No bruits, nodes or JVD. Respiratory: Respiratory effort normal.  BS equal and clear bilateral without rales, rhonci, wheezing or stridor. Cardio: Heart sounds are normal with regular rate and rhythm and no murmurs, rubs or gallops. Peripheral  pulses are normal and equal bilaterally without edema. No aortic or femoral bruits. Chest: symmetric with normal excursions and percussion.  Abdomen: Soft, with Nl bowel sounds. Nontender, no guarding, rebound, hernias, masses, or organomegaly.  Lymphatics: Non tender without lymphadenopathy.  Musculoskeletal: Full ROM all peripheral extremities, joint stability, 5/5 strength, and normal gait. Skin: Warm and dry without rashes, lesions, cyanosis, clubbing or  ecchymosis.  Neuro: Cranial nerves intact, reflexes equal bilaterally. Normal muscle tone, no cerebellar symptoms. Sensation intact.  Pysch: Alert and oriented X 3 with normal affect, insight and judgment appropriate.   Assessment and Plan  1. Annual Preventative/Screening Exam   2. Essential hypertension  - [place], retroperitnl abd,  ltd - Urinalysis, Routine w reflex microscopic - Microalbumin / Creatinine Urine Ratio - CBC with Diff - COMPLETE METABOLIC PANEL WITH GFR - Magnesium - TSH  3. Hyperlipidemia associated with type 2 diabetes mellitus (HCC)  - [place], retroperitnl abd,  ltd - Lipid Profile - TSH  4. Type 2 diabetes mellitus with stage 2 chronic kidney disease, without long-term current use of insulin (HCC)  - [place], retroperitnl abd,  ltd - Urinalysis, Routine w reflex microscopic - Microalbumin / Creatinine Urine Ratio - Hemoglobin A1c ([facility]) - Insulin, random  5. Vitamin D deficiency  - Vitamin D (25 hydroxy)  6.  Coronary artery disease involving native coronary artery of native heart without angina pectoris  - [place], retroperitnl abd,  ltd - Lipid Profile  7. Chronic systolic CHF (congestive heart failure) (HCC)  - [place], retroperitnl abd,  ltd  8. Hypothyroidism, unspecified type  - TSH  9. Chronic obstructive pulmonary disease ([place])  10. BPH with obstruction/lower urinary tract symptoms  - PSA  11. Class 2 severe obesity due to excess calories with serious comorbidity and body mass index (BMI) of 35.0 to 35.9 in adult ([place])   12. Family history of hypertension  - [place], retroperitnl abd,  ltd  93. Smoker  - [place], retroperitnl abd,  ltd  14. Screening for AAA (aortic abdominal aneurysm)  - [place], retroperitnl abd,  ltd  15. Prostate cancer screening  - PSA  16. Screening for colorectal cancer  - POC Hemoccult Bld/Stl  17. Medication management  - Urinalysis, Routine w reflex microscopic - Microalbumin / Creatinine Urine Ratio - CBC with Diff - COMPLETE METABOLIC PANEL WITH GFR - Magnesium - Lipid Profile - TSH - Hemoglobin A1c ([facility]) - Insulin, random - Vitamin D (25 hydroxy)        Patient was counseled in prudent diet, weight control to achieve/maintain BMI less than 25, BP monitoring, regular exercise and medications as discussed.  Discussed med effects and SE's. Routine screening labs and tests as requested with regular follow-up as recommended. Over 40 minutes of exam, counseling, chart review and high complex critical decision making was performed   Kirtland Bouchard, MD

## 2019-11-16 ENCOUNTER — Encounter: Payer: Self-pay | Admitting: Internal Medicine

## 2019-11-16 DIAGNOSIS — E66812 Obesity, class 2: Secondary | ICD-10-CM | POA: Insufficient documentation

## 2019-11-16 DIAGNOSIS — Z6835 Body mass index (BMI) 35.0-35.9, adult: Secondary | ICD-10-CM | POA: Insufficient documentation

## 2019-11-16 DIAGNOSIS — E1169 Type 2 diabetes mellitus with other specified complication: Secondary | ICD-10-CM | POA: Insufficient documentation

## 2019-11-16 NOTE — Patient Instructions (Signed)

## 2019-11-17 ENCOUNTER — Encounter: Payer: Self-pay | Admitting: Internal Medicine

## 2019-11-17 ENCOUNTER — Ambulatory Visit: Payer: 59 | Admitting: Internal Medicine

## 2019-11-17 ENCOUNTER — Other Ambulatory Visit: Payer: Self-pay

## 2019-11-17 VITALS — BP 126/74 | HR 59 | Temp 96.3°F | Ht 73.5 in | Wt 260.2 lb

## 2019-11-17 DIAGNOSIS — Z136 Encounter for screening for cardiovascular disorders: Secondary | ICD-10-CM

## 2019-11-17 DIAGNOSIS — Z8249 Family history of ischemic heart disease and other diseases of the circulatory system: Secondary | ICD-10-CM

## 2019-11-17 DIAGNOSIS — F332 Major depressive disorder, recurrent severe without psychotic features: Secondary | ICD-10-CM

## 2019-11-17 DIAGNOSIS — Z0000 Encounter for general adult medical examination without abnormal findings: Secondary | ICD-10-CM

## 2019-11-17 DIAGNOSIS — Z79899 Other long term (current) drug therapy: Secondary | ICD-10-CM

## 2019-11-17 DIAGNOSIS — I10 Essential (primary) hypertension: Secondary | ICD-10-CM | POA: Diagnosis not present

## 2019-11-17 DIAGNOSIS — Z125 Encounter for screening for malignant neoplasm of prostate: Secondary | ICD-10-CM

## 2019-11-17 DIAGNOSIS — E039 Hypothyroidism, unspecified: Secondary | ICD-10-CM

## 2019-11-17 DIAGNOSIS — J449 Chronic obstructive pulmonary disease, unspecified: Secondary | ICD-10-CM

## 2019-11-17 DIAGNOSIS — F17200 Nicotine dependence, unspecified, uncomplicated: Secondary | ICD-10-CM

## 2019-11-17 DIAGNOSIS — Z1211 Encounter for screening for malignant neoplasm of colon: Secondary | ICD-10-CM

## 2019-11-17 DIAGNOSIS — E785 Hyperlipidemia, unspecified: Secondary | ICD-10-CM

## 2019-11-17 DIAGNOSIS — I5022 Chronic systolic (congestive) heart failure: Secondary | ICD-10-CM

## 2019-11-17 DIAGNOSIS — N182 Chronic kidney disease, stage 2 (mild): Secondary | ICD-10-CM

## 2019-11-17 DIAGNOSIS — E559 Vitamin D deficiency, unspecified: Secondary | ICD-10-CM

## 2019-11-17 DIAGNOSIS — N138 Other obstructive and reflux uropathy: Secondary | ICD-10-CM

## 2019-11-17 DIAGNOSIS — N401 Enlarged prostate with lower urinary tract symptoms: Secondary | ICD-10-CM

## 2019-11-17 DIAGNOSIS — Z1212 Encounter for screening for malignant neoplasm of rectum: Secondary | ICD-10-CM

## 2019-11-17 DIAGNOSIS — Z6835 Body mass index (BMI) 35.0-35.9, adult: Secondary | ICD-10-CM

## 2019-11-17 DIAGNOSIS — Z0001 Encounter for general adult medical examination with abnormal findings: Secondary | ICD-10-CM

## 2019-11-17 DIAGNOSIS — E1169 Type 2 diabetes mellitus with other specified complication: Secondary | ICD-10-CM

## 2019-11-17 DIAGNOSIS — Z23 Encounter for immunization: Secondary | ICD-10-CM | POA: Diagnosis not present

## 2019-11-17 DIAGNOSIS — E1122 Type 2 diabetes mellitus with diabetic chronic kidney disease: Secondary | ICD-10-CM

## 2019-11-17 DIAGNOSIS — I2510 Atherosclerotic heart disease of native coronary artery without angina pectoris: Secondary | ICD-10-CM

## 2019-11-17 MED ORDER — BUPROPION HCL ER (XL) 300 MG PO TB24: ORAL_TABLET | ORAL | 3 refills | Status: DC

## 2019-11-18 LAB — COMPLETE METABOLIC PANEL WITH GFR
AG Ratio: 1.8 (calc) (ref 1.0–2.5)
ALT: 13 U/L (ref 9–46)
AST: 15 U/L (ref 10–35)
Albumin: 4.1 g/dL (ref 3.6–5.1)
Alkaline phosphatase (APISO): 82 U/L (ref 35–144)
BUN: 21 mg/dL (ref 7–25)
CO2: 25 mmol/L (ref 20–32)
Calcium: 9.4 mg/dL (ref 8.6–10.3)
Chloride: 99 mmol/L (ref 98–110)
Creat: 1.17 mg/dL (ref 0.70–1.25)
GFR, Est African American: 75 mL/min/{1.73_m2} (ref >=60)
GFR, Est Non African American: 65 mL/min/{1.73_m2} (ref >=60)
Globulin: 2.3 g/dL (calc) (ref 1.9–3.7)
Glucose, Bld: 129 mg/dL — ABNORMAL HIGH (ref 65–99)
Potassium: 4.4 mmol/L (ref 3.5–5.3)
Sodium: 136 mmol/L (ref 135–146)
Total Bilirubin: 0.8 mg/dL (ref 0.2–1.2)
Total Protein: 6.4 g/dL (ref 6.1–8.1)

## 2019-11-18 LAB — MICROALBUMIN / CREATININE URINE RATIO
Creatinine, Urine: 55 mg/dL (ref 20–320)
Microalb, Ur: <0.2 mg/dL

## 2019-11-18 LAB — URINALYSIS, ROUTINE W REFLEX MICROSCOPIC
Bilirubin Urine: NEGATIVE
Glucose, UA: NEGATIVE
Hgb urine dipstick: NEGATIVE
Ketones, ur: NEGATIVE
Leukocytes,Ua: NEGATIVE
Nitrite: NEGATIVE
Protein, ur: NEGATIVE
Specific Gravity, Urine: 1.01 (ref 1.001–1.03)
pH: 6 (ref 5.0–8.0)

## 2019-11-18 LAB — CBC WITH DIFFERENTIAL/PLATELET
Absolute Monocytes: 494 cells/uL (ref 200–950)
Basophils Absolute: 59 cells/uL (ref 0–200)
Basophils Relative: 0.9 %
Eosinophils Absolute: 163 cells/uL (ref 15–500)
Eosinophils Relative: 2.5 %
HCT: 43.8 % (ref 38.5–50.0)
Hemoglobin: 14.7 g/dL (ref 13.2–17.1)
Lymphs Abs: 1170 cells/uL (ref 850–3900)
MCH: 29.6 pg (ref 27.0–33.0)
MCHC: 33.6 g/dL (ref 32.0–36.0)
MCV: 88.3 fL (ref 80.0–100.0)
MPV: 12.3 fL (ref 7.5–12.5)
Monocytes Relative: 7.6 %
Neutro Abs: 4615 cells/uL (ref 1500–7800)
Neutrophils Relative %: 71 %
Platelets: 196 10*3/uL (ref 140–400)
RBC: 4.96 10*6/uL (ref 4.20–5.80)
RDW: 13.8 % (ref 11.0–15.0)
Total Lymphocyte: 18 %
WBC: 6.5 10*3/uL (ref 3.8–10.8)

## 2019-11-18 LAB — TSH: TSH: 0.84 mIU/L (ref 0.40–4.50)

## 2019-11-18 LAB — HEMOGLOBIN A1C
Hgb A1c MFr Bld: 6.7 % of total Hgb — ABNORMAL HIGH (ref <5.7)
Mean Plasma Glucose: 146 (calc)
eAG (mmol/L): 8.1 (calc)

## 2019-11-18 LAB — INSULIN, RANDOM: Insulin: 6.5 u[IU]/mL

## 2019-11-18 LAB — LIPID PANEL
Cholesterol: 108 mg/dL (ref <200)
HDL: 47 mg/dL (ref >=40)
LDL Cholesterol (Calc): 39 mg/dL (calc)
Non-HDL Cholesterol (Calc): 61 mg/dL (calc) (ref <130)
Total CHOL/HDL Ratio: 2.3 (calc) (ref <5.0)
Triglycerides: 132 mg/dL (ref <150)

## 2019-11-18 LAB — VITAMIN D 25 HYDROXY (VIT D DEFICIENCY, FRACTURES): Vit D, 25-Hydroxy: 60 ng/mL (ref 30–100)

## 2019-11-18 LAB — MAGNESIUM: Magnesium: 1.9 mg/dL (ref 1.5–2.5)

## 2019-11-18 LAB — PSA: PSA: 0.6 ng/mL (ref <=4.0)

## 2019-11-19 ENCOUNTER — Encounter: Payer: Self-pay | Admitting: Internal Medicine

## 2019-11-19 ENCOUNTER — Other Ambulatory Visit: Payer: Self-pay | Admitting: Internal Medicine

## 2019-11-19 DIAGNOSIS — E785 Hyperlipidemia, unspecified: Secondary | ICD-10-CM

## 2019-11-27 ENCOUNTER — Other Ambulatory Visit: Payer: Self-pay

## 2019-11-27 ENCOUNTER — Telehealth: Payer: Self-pay

## 2019-11-27 MED ORDER — ENTRESTO 49-51 MG PO TABS: 1.0000 | ORAL_TABLET | Freq: Two times a day (BID) | ORAL | 3 refills | Status: DC

## 2019-11-27 NOTE — Telephone Encounter (Signed)
**Note De-Identified Akita Maxim Obfuscation** I started a Entresto PA through covermymeds. Key: [id number]

## 2019-11-27 NOTE — Telephone Encounter (Addendum)
**Note De-Identified Curtis Perez Obfuscation** Message received from Covermymeds: This medication or product was previously approved on [id number] from 2018-12-24 to 2020-12-24.  I did send a refill for the pts Entresto to [facility] in error and have made them aware not to fill and I removed [facility] from the pts pharmacy list that I added in error.

## 2019-12-04 ENCOUNTER — Other Ambulatory Visit: Payer: Self-pay | Admitting: Cardiovascular Disease

## 2019-12-04 NOTE — Telephone Encounter (Signed)
Eliquis 5mg  refill request received, pt is 68yrs old, weight-118kg, Crea-1.17 on 11/17/2019, Diagnosis-Afib, and last seen by Vin on 10/28/2019. Dose is appropriate based on dosing criteria. Will send in refill to requested pharmacy.

## 2019-12-28 ENCOUNTER — Other Ambulatory Visit: Payer: Self-pay | Admitting: Physician Assistant

## 2019-12-31 ENCOUNTER — Telehealth: Payer: Self-pay

## 2019-12-31 NOTE — Telephone Encounter (Signed)
**Note De-Identified Curtis Perez Obfuscation** I started a Entresto PA through covermymeds. Key: [id number]

## 2020-01-01 NOTE — Telephone Encounter (Addendum)
**Note De-Identified Curtis Perez Obfuscation** Letter received from [employer] stating that they approved the pts Entresto PA. Approval good until 12/30/2020 Patient ID: [id number]  I have notified the pts pharmacy of this approval.

## 2020-01-02 ENCOUNTER — Other Ambulatory Visit: Payer: Self-pay | Admitting: Internal Medicine

## 2020-01-05 ENCOUNTER — Telehealth: Payer: Self-pay | Admitting: Cardiovascular Disease

## 2020-01-05 NOTE — Telephone Encounter (Signed)
Patient states that due to switching insurance to [facility], he is required to receive prior authorization in order to receive medication refill. Patient states he only has 3 tablets left. Please call.   *STAT* If patient is at the pharmacy, call can be transferred to refill team.   1. Which medications need to be refilled? (please list name of each medication and dose if known) ENTRESTO 49-51 MG  2. Which pharmacy/location (including street and city if local pharmacy) is medication to be sent to? [id number] [place] [place] [facility] [address] AT [place]  3. Do they need a 30 day or 90 day supply? 30 day supply

## 2020-01-05 NOTE — Telephone Encounter (Signed)
Pt's medication has already been sent to pt's pharmacy and the prior auth has already been done as well. Confirmation for both in pt's chart.

## 2020-01-28 ENCOUNTER — Ambulatory Visit: Payer: Self-pay | Admitting: Cardiovascular Disease

## 2020-04-12 ENCOUNTER — Telehealth: Payer: Self-pay

## 2020-04-12 NOTE — Telephone Encounter (Signed)
**Note De-Identified Curtis Perez Obfuscation** Per the pts chart he has commercial ins coverage so he could use a Entresto $10 co-pay card. Do we have any in the office?

## 2020-04-12 NOTE — Telephone Encounter (Signed)
Pt calling stating that [facility] for Delene Loll and it looks like a prior Josem Kaufmann was done in January and the pt was approved. Jeani Hawking, LPN, could you address this matter, please? Thank you

## 2020-04-12 NOTE — Telephone Encounter (Signed)
Called pt to explain to him that because he has Pharmacist, community that he could use the $10 co-pay card and that I will be leaving that [place] co-pay card downstairs for pt to pick up and if he has any other problems, questions or concerns, to give our office a call back. Pt verbalized understanding.

## 2020-04-13 ENCOUNTER — Telehealth: Payer: Self-pay

## 2020-04-13 NOTE — Telephone Encounter (Signed)
**Note De-Identified Curtis Perez Obfuscation** Letter received from [place] stating that they did approve this Entresto {A. Approval good until 04/13/2021  I have made [facility] (called but on hold for more than 7 mins so I hung up, called back and left a message with approval details) and the pt (through [facility]) aware of this approval.

## 2020-04-13 NOTE — Telephone Encounter (Signed)
**Note De-Identified Axil Copeman Obfuscation** Because I wanted the pt to know his Delene Loll PA was approved ASAP(he has not read his [facility] mess that I sent him 45 mins ago so I called and made him aware. He thanked me for all of our help.

## 2020-04-13 NOTE — Telephone Encounter (Signed)
**Note De-Identified Curtis Perez Obfuscation** Per [facility] the pt has a new INS card and provided me needed info to do this [place] PA: [id number] BIN: [id number] PCN: [id number]  I started an urgent Entresto PA through Covermymeds per [facility] message sent from patient today : Key: [id number]

## 2020-04-13 NOTE — Telephone Encounter (Signed)
Called patient today in response to a MyChart message that he sent regarding his Entresto. [facility] states [facility] is [facility].  I called [facility] and spoke with Tammy who advised that they have patient's insurance on file as [facility]. I advised that we have completed and received confirmation of PA from [employer] for Entresto through 1/22. Tammy states they do not have that information. I called patient back and advised him of the information I received from [place]. He states that he gave that information to [facility] in December but he will go back and follow-up with them. He thanked me for my help.

## 2020-06-26 ENCOUNTER — Other Ambulatory Visit: Payer: Self-pay | Admitting: Internal Medicine

## 2020-06-26 DIAGNOSIS — K219 Gastro-esophageal reflux disease without esophagitis: Secondary | ICD-10-CM

## 2020-07-13 ENCOUNTER — Other Ambulatory Visit: Payer: Self-pay

## 2020-09-19 ENCOUNTER — Other Ambulatory Visit: Payer: Self-pay | Admitting: Internal Medicine

## 2020-09-19 DIAGNOSIS — I10 Essential (primary) hypertension: Secondary | ICD-10-CM

## 2020-09-19 DIAGNOSIS — I509 Heart failure, unspecified: Secondary | ICD-10-CM

## 2020-10-08 ENCOUNTER — Other Ambulatory Visit: Payer: Self-pay | Admitting: Internal Medicine

## 2020-10-08 DIAGNOSIS — E785 Hyperlipidemia, unspecified: Secondary | ICD-10-CM

## 2020-10-08 DIAGNOSIS — F332 Major depressive disorder, recurrent severe without psychotic features: Secondary | ICD-10-CM

## 2020-10-21 ENCOUNTER — Other Ambulatory Visit: Payer: Self-pay | Admitting: Physician Assistant

## 2020-11-10 ENCOUNTER — Other Ambulatory Visit: Payer: Self-pay | Admitting: Cardiovascular Disease

## 2020-11-10 NOTE — Telephone Encounter (Signed)
Age 69, weight 118kg, SCr 1.17 on 11/30, sees PCP next month for annual exam and labs should be drawn again then.  Was supposed to see Dr Acie Fredrickson Feb 2021, last appt was 10/28/19, pt overdue for annual follow up. Will send in 1 month refill and send message to scheduling dept.

## 2020-11-22 ENCOUNTER — Telehealth: Payer: Self-pay | Admitting: Pharmacist

## 2020-11-22 NOTE — Telephone Encounter (Signed)
-----   Message from Marca Ancona sent at 11/22/2020  9:40 AM EST ----- Regarding: RE: annual appt Good morning,  We have made 3 attempts of contact with no response.  Thank you  ----- Message ----- From: Leeroy Bock, RPH-CPP Sent: 11/10/2020  10:23 AM EST To: [facility] Scheduling Subject: annual appt                                    Good morning,  We received a refill request for a med for this pt. He is overdue for annual follow up with Dr Acie Fredrickson. I'm hoping you can give him a call to get this scheduled, thanks!  Jinny Blossom

## 2020-12-07 ENCOUNTER — Other Ambulatory Visit: Payer: Self-pay | Admitting: Cardiovascular Disease

## 2020-12-07 NOTE — Telephone Encounter (Signed)
Pt last saw Robbie Lis, PA on 10/27/20, pt is overdue for follow-up. Pt has an appt with Dr Acie Fredrickson on 01/15/20. Last labs 11/17/19 Creat 1.17, pt is overdue for labwork. Placed note on upcoming appt pt needs CBC and BMP at OV. Age 70, weight 118kg, based on specified criteria pt is on appropriate dosage of Eliquis 5mg  BID.

## 2020-12-15 ENCOUNTER — Encounter: Payer: Self-pay | Admitting: Internal Medicine

## 2020-12-15 NOTE — Progress Notes (Signed)
Annual  Screening/Preventative Visit  & Comprehensive Evaluation & Examination      This very nice 69 y.o. single WM presents for a Screening /Preventative Visit & comprehensive evaluation and management of multiple medical co-morbidities.  Patient has been followed for HTN, HLD, T2_NIDDM  Prediabetes and Vitamin D Deficiency.     HTN predates since 2005 when admitted with ACS and underwent PCA /DES. Then in 2013, he underwent CABG. In 2018, he had a 2sd NSTEMI with DES implantedand also had CHF during that admission.  In 2019 , he presented with pAfib and Brilinta wa changed to Eliquis. In 2019, he was hospitalized with CHF (EF 30-35%) In Oct 2020, he has CV by Dr Melburn Popper. Patient is on Entresto for Dx of chronic sys CHF.  He continues to smoke approx 2 ppd.  Patient's BP has been controlled at home.  Today's BP is  at goal - 116/74. Patient denies any cardiac symptoms as chest pain, palpitations, shortness of breath, dizziness or ankle swelling.      Patient's hyperlipidemia is controlled with diet and Rosuvastatin//Ezetimibe. Patient denies myalgias or other medication SE's. Last lipids were at goal:  Lab Results  Component Value Date   CHOL 108 11/17/2019   HDL 47 11/17/2019   LDLCALC 39 11/17/2019   TRIG 132 11/17/2019   CHOLHDL 2.3 11/17/2019        Patient has Morbid Obesity (BMI 35+) and was dx'd in 2011 with T2_DM with CKD2  (GFR 65).  Patient denies reactive hypoglycemic symptoms, visual blurring, diabetic polys or paresthesias.  Patient refuses CBG monitoring. Last A1c was not at goal:   Lab Results  Component Value Date   HGBA1C 6.7 (H) 11/17/2019        In 2015,  patient was dx'd Hypothyroid  and initiated on Thyroid replacement.      Finally, patient has history of Vitamin D Deficiency ("6" /2008)and last vitamin D was at goal:   Lab Results  Component Value Date   VD25OH 60 11/17/2019    Current Outpatient Medications on File Prior to Visit  Medication Sig   . aspirin 81 MG  tablet 1 tablet daily   . buPROPion-XL) 300 MG  Take 1 tablet  Daily  . ELIQUIS 5 MG TABS TAKE 1 TABLET  2  TIMES DAILY.   Marland Kitchen ENTRESTO 49-51 MG TAKE 1 TABLET TWICE A DAY  . ezetimibe 10 MG tablet Take 1 tablet Daily for Cholesterol  . ferrous sulfate 325 (65 FE) MG  Take every Monday, Wednesday, and Friday.  Marland Kitchen FLUoxetine  40 MG capsule Take 1 capsule  Daily   . furosemide (40 MG tablet Take 1 tablet daily  (Dx: i10)  . IMDUR 30 MG 24 hr tablet Take 1 tablet Daily for Heart  . Levothyroxine 100 MCG tablet Take 1 tablet Daily   . magnesium 400 MG tablet Take  daily.  . metFORMIN-XR 500 MG  Take 2 tablets 2 x /day   . metoprolol succ-XL 50 MG  Take 1 tablet  daily.   . metoprolol tart 25 MG tablet Take 1 tablet 2 x /day   . NITROSTAT 0.4 MG SL tablet  as needed   . Omega-3 FISH OIL  2,000 mg   Takedaily.  . Omeprazole  40 MG  TAKE 1 CAPSULE DAILY   . potassium  20 MEQ Take 20 mEq  daily.   . rosuvastatin 40 MG tablet Take 1 tablet Daily   . vitamin B-12  1000 MCG tablet Take  daily.  . Vitamin D 50,000 u Take 1 cap  3 times a week.    Allergies  Allergen Reactions  . Lisinopril Cough    Past Medical History:  Diagnosis Date  . Acute inferior myocardial infarction ([place]) 10/24/2017  . Adenomatous colon polyp 02/15/2009  . Bradycardia   . CAD (coronary artery disease)    remote MI in 2004 with PCI; s/p CABG x 3 03/2012  . CHF (congestive heart failure) ([place])   . COPD (chronic obstructive pulmonary disease) ([place])   . Depression   . Family history of malignant neoplasm of gastrointestinal tract   . GERD (gastroesophageal reflux disease)   . Hyperlipidemia   . Hypertension   . Hypothyroidism   . Myocardial infarction ([place])    2004  . Obesity   . Prediabetes   . 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 Tobacco abuse 03/28/2012  . Vitamin D deficiency    Health Maintenance  Topic Date Due  . OPHTHALMOLOGY  EXAM  Never done  . FOOT EXAM  08/13/2018  . HEMOGLOBIN A1C  05/16/2020  . COLONOSCOPY (Pts 45-5yrs Insurance coverage will need to be confirmed)  06/25/2020  . INFLUENZA VACCINE  Never done  . COVID-19 Vaccine (3 - Booster for Pfizer series) 11/10/2020  . URINE MICROALBUMIN  11/16/2020  . PNA vac Low Risk Adult (2 of 2 - PPSV23) 11/16/2020  . TETANUS/TDAP  11/16/2029  . Hepatitis C Screening  Completed   Immunization History  Administered Date(s) Administered  . PFIZER SARS-COV-2 Vaccination 04/19/2020, 05/10/2020  . PPD Test 06/15/2014, 07/10/2016  . Pneumococcal Conjugate-13 11/17/2019  . Pneumococcal Polysaccharide-23 01/05/2009, 08/10/2014  . Td 01/05/2009, 11/17/2019  . Zoster 01/03/2016   Last Colon - 06/25/2017 - Dr Havery Moros - Recc 3 yr f/u due July 2021.   On 08/04/2020 - Dr Havery Moros sent letter recommending f/u OV before Colonoscopy. Patient relates that he is aware that he is overdue.   Past Surgical History:  Procedure Laterality Date  . BACK SURGERY    . CARDIOVERSION N/A 10/10/2019   Procedure: CARDIOVERSION;  Surgeon: Pixie Casino, MD;  Location: [facility];  Service: Cardiovascular;  Laterality: N/A;  . COLONOSCOPY  02/15/2009  . CORONARY ANGIOPLASTY WITH STENT PLACEMENT     about 8 years ago at [facility]  . CORONARY ARTERY BYPASS GRAFT  04/02/2012   Procedure: CORONARY ARTERY BYPASS GRAFTING (CABG);  Surgeon: Rexene Alberts, MD;  Location: [facility];  Service: Open Heart Surgery;  Laterality: N/A;  On pump, times three graphs using endoscopically harvested right greater saphenous vein and left internal mammary artery.   [facility] [place] ACUTE MI REVASCULARIZATION N/A 10/23/2017   Procedure: Coronary/Graft Acute MI Revascularization;  Surgeon: Jettie Booze, MD;  Location: [facility];  Service: Cardiovascular;  Laterality: N/A;  . KNEE SURGERY    . LEFT HEART CATH AND CORS/GRAFTS ANGIOGRAPHY N/A 10/23/2017   Procedure: LEFT HEART CATH AND  CORS/GRAFTS ANGIOGRAPHY;  Surgeon: Jettie Booze, MD;  Location: [facility];  Service: Cardiovascular;  Laterality: N/A;  . TONSILLECTOMY     Family History  Problem Relation Age of Onset  . Hypertension Mother   . Alzheimer's disease Mother   . Colon cancer Father   . Colon cancer Paternal Grandmother   . Esophageal cancer Neg Hx   . Rectal cancer Neg Hx   .  Stomach cancer Neg Hx    Social History   Socioeconomic History  . Marital status: Divorced    Spouse name:   . Number of children: 0  Occupational History  . Occupation: Disability  . Occupation: Science writer for [employer]  Tobacco Use  . Smoking status: Current Every Day Smoker    Packs/day: 2.00    Years: 40.00    Pack years: 80.00    Types: Cigarettes  . Smokeless tobacco: Never Used  . Tobacco comment: down from 2ppd  Vaping Use  . Vaping Use: Never used  Substance and Sexual Activity  . Alcohol use: No    Alcohol/week: 2.0 standard drinks    Types: 1 Glasses of wine, 1 Shots of liquor per week    Comment: Rarely  . Drug use: No  . Sexual activity: Not Currently   ROS Constitutional: Denies fever, chills, weight loss/gain, headaches, insomnia,  night sweats or change in appetite. Does c/o fatigue. Eyes: Denies redness, blurred vision, diplopia, discharge, itchy or watery eyes.  ENT: Denies discharge, congestion, post nasal drip, epistaxis, sore throat, earache, hearing loss, dental pain, Tinnitus, Vertigo, Sinus pain or snoring.  Cardio: Denies chest pain, palpitations, irregular heartbeat, syncope, dyspnea, diaphoresis, orthopnea, PND, claudication or edema Respiratory: denies cough, dyspnea, DOE, pleurisy, hoarseness, laryngitis or wheezing.  Gastrointestinal: Denies dysphagia, heartburn, reflux, water brash, pain, cramps, nausea, vomiting, bloating, diarrhea, constipation, hematemesis, melena, hematochezia, jaundice or hemorrhoids Genitourinary: Denies dysuria, frequency, urgency, nocturia,  hesitancy, discharge, hematuria or flank pain Musculoskeletal: Denies arthralgia, myalgia, stiffness, Jt. Swelling, pain, limp or strain/sprain. Denies Falls. Skin: Denies puritis, rash, hives, warts, acne, eczema or change in skin lesion Neuro: No weakness, tremor, incoordination, spasms, paresthesia or pain Psychiatric: Denies confusion, memory loss or sensory loss. Denies Depression. Endocrine: Denies change in weight, skin, hair change, nocturia, and paresthesia, diabetic polys, visual blurring or hyper / hypo glycemic episodes.  Heme/Lymph: No excessive bleeding, bruising or enlarged lymph nodes.  Physical Exam  BP 116/74   Pulse 87   Temp (!) 97.4 F (36.3 C)   Resp 16   Ht 6\' 2"  (1.88 m)   Wt 255 lb 11 oz (116 kg)   SpO2 96%   BMI 32.83 kg/m   General Appearance: Well nourished and well groomed and in no apparent distress.  Eyes: PERRLA, EOMs, conjunctiva no swelling or erythema, normal fundi and vessels. Sinuses: No frontal/maxillary tenderness ENT/Mouth: EACs patent / TMs  nl. Nares clear without erythema, swelling, mucoid exudates. Oral hygiene is good. No erythema, swelling, or exudate. Tongue normal, non-obstructing. Tonsils not swollen or erythematous. Hearing normal.  Neck: Supple, thyroid not palpable. No bruits, nodes or JVD. Respiratory: Respiratory effort normal.  BS equal and clear bilateral without rales, rhonci, wheezing or stridor. Cardio: Heart sounds are normal with regular rate and rhythm and no murmurs, rubs or gallops. Peripheral pulses are normal and equal bilaterally without edema. No aortic or femoral bruits. Chest: symmetric with normal excursions and percussion.  Abdomen: Soft, with Nl bowel sounds. Nontender, no guarding, rebound, hernias, masses, or organomegaly.  Lymphatics: Non tender without lymphadenopathy.  Musculoskeletal: Full ROM all peripheral extremities, joint stability, 5/5 strength, and normal gait. Skin: Warm and dry without rashes,  lesions, cyanosis, clubbing or  ecchymosis.  Neuro: Cranial nerves intact, reflexes equal bilaterally. Normal muscle tone, no cerebellar symptoms. Sensation intact.  Pysch: Alert and oriented X 3 with normal affect, insight and judgment appropriate.   Assessment and Plan  1. Annual Preventative/Screening Exam  2. Essential hypertension  - EKG 12-Lead - [place], RETROPERITNL ABD,  LTD - Urinalysis, Routine w reflex microscopic - Microalbumin / creatinine urine ratio - CBC with Differential/Platelet - COMPLETE METABOLIC PANEL WITH GFR - Magnesium - TSH  3. Hyperlipidemia associated with type 2 diabetes mellitus ([place])  - EKG 12-Lead - [place], RETROPERITNL ABD,  LTD - Lipid panel - TSH  4. Type 2 diabetes mellitus with stage 2 chronic kidney  disease, without long-term current use of insulin (HCC)  - EKG 12-Lead - [place], RETROPERITNL ABD,  LTD - Hemoglobin A1c - Insulin, random  5. Vitamin D deficiency  - VITAMIN D 25 Hydroxy  6. Coronary artery disease involving native coronary  artery of native heart without angina pectoris  - EKG 12-Lead - Lipid panel  7. S/P CABG x 3  - EKG 12-Lead  8. Chronic systolic CHF (congestive heart failure) (HCC)  - EKG 12-Lead  9. Paroxysmal atrial fibrillation (HCC)  - EKG 12-Lead - TSH  10. Hypothyroidism, unspecified type  - TSH  11. 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 Class 2 severe obesity due to excess calories  with serious comorbidity and body mass index (BMI)  of 35.0 to 35.9 in adult (HCC)  - TSH  14. Tobacco abuse   15. Screening for ischemic heart disease  - EKG 12-Lead  16. Screening for AAA (aortic abdominal aneurysm)  - [place], RETROPERITNL ABD,  LTD  17. Family history of hypertension  - EKG 12-Lead - [place], RETROPERITNL ABD,  LTD  18. History of MI (myocardial infarction)  - EKG 12-Lead  19. Smoker  - EKG 12-Lead - [place],  RETROPERITNL ABD,  LTD  20. Prostate cancer screening  - PSA  21. Screening for colorectal cancer  - POC Hemoccult Bld/Stl   22. Medication management  - Urinalysis, Routine w reflex microscopic - Microalbumin / creatinine urine ratio - CBC with Differential/Platelet - COMPLETE METABOLIC PANEL WITH GFR - Magnesium - Lipid panel - TSH - Hemoglobin A1c - Insulin, random - VITAMIN D 25 Hydroxy            Patient was counseled in prudent diet, weight control to achieve/maintain BMI less than 25, BP monitoring, regular exercise and medications as discussed.  Discussed med effects and SE's. Routine screening labs and tests as requested with regular follow-up as recommended. Over 40 minutes of exam, counseling, chart review and high complex critical decision making was performed   Kirtland Bouchard, MD

## 2020-12-15 NOTE — Patient Instructions (Signed)

## 2020-12-16 ENCOUNTER — Ambulatory Visit (INDEPENDENT_AMBULATORY_CARE_PROVIDER_SITE_OTHER): Payer: PPO | Admitting: Internal Medicine

## 2020-12-16 ENCOUNTER — Other Ambulatory Visit: Payer: Self-pay

## 2020-12-16 VITALS — BP 116/74 | HR 87 | Temp 97.4°F | Resp 16 | Ht 74.0 in | Wt 255.7 lb

## 2020-12-16 DIAGNOSIS — Z6835 Body mass index (BMI) 35.0-35.9, adult: Secondary | ICD-10-CM

## 2020-12-16 DIAGNOSIS — I480 Paroxysmal atrial fibrillation: Secondary | ICD-10-CM | POA: Diagnosis not present

## 2020-12-16 DIAGNOSIS — Z720 Tobacco use: Secondary | ICD-10-CM

## 2020-12-16 DIAGNOSIS — I252 Old myocardial infarction: Secondary | ICD-10-CM | POA: Diagnosis not present

## 2020-12-16 DIAGNOSIS — E039 Hypothyroidism, unspecified: Secondary | ICD-10-CM

## 2020-12-16 DIAGNOSIS — I25700 Atherosclerosis of coronary artery bypass graft(s), unspecified, with unstable angina pectoris: Secondary | ICD-10-CM | POA: Diagnosis not present

## 2020-12-16 DIAGNOSIS — E1122 Type 2 diabetes mellitus with diabetic chronic kidney disease: Secondary | ICD-10-CM

## 2020-12-16 DIAGNOSIS — I5022 Chronic systolic (congestive) heart failure: Secondary | ICD-10-CM

## 2020-12-16 DIAGNOSIS — I2510 Atherosclerotic heart disease of native coronary artery without angina pectoris: Secondary | ICD-10-CM | POA: Diagnosis not present

## 2020-12-16 DIAGNOSIS — Z136 Encounter for screening for cardiovascular disorders: Secondary | ICD-10-CM

## 2020-12-16 DIAGNOSIS — Z951 Presence of aortocoronary bypass graft: Secondary | ICD-10-CM

## 2020-12-16 DIAGNOSIS — E785 Hyperlipidemia, unspecified: Secondary | ICD-10-CM | POA: Diagnosis not present

## 2020-12-16 DIAGNOSIS — Z8249 Family history of ischemic heart disease and other diseases of the circulatory system: Secondary | ICD-10-CM | POA: Diagnosis not present

## 2020-12-16 DIAGNOSIS — E1169 Type 2 diabetes mellitus with other specified complication: Secondary | ICD-10-CM | POA: Diagnosis not present

## 2020-12-16 DIAGNOSIS — Z125 Encounter for screening for malignant neoplasm of prostate: Secondary | ICD-10-CM

## 2020-12-16 DIAGNOSIS — N401 Enlarged prostate with lower urinary tract symptoms: Secondary | ICD-10-CM | POA: Diagnosis not present

## 2020-12-16 DIAGNOSIS — Z1211 Encounter for screening for malignant neoplasm of colon: Secondary | ICD-10-CM

## 2020-12-16 DIAGNOSIS — Z0001 Encounter for general adult medical examination with abnormal findings: Secondary | ICD-10-CM

## 2020-12-16 DIAGNOSIS — Z0000 Encounter for general adult medical examination without abnormal findings: Secondary | ICD-10-CM | POA: Diagnosis not present

## 2020-12-16 DIAGNOSIS — Z23 Encounter for immunization: Secondary | ICD-10-CM

## 2020-12-16 DIAGNOSIS — I10 Essential (primary) hypertension: Secondary | ICD-10-CM

## 2020-12-16 DIAGNOSIS — F17200 Nicotine dependence, unspecified, uncomplicated: Secondary | ICD-10-CM

## 2020-12-16 DIAGNOSIS — Z79899 Other long term (current) drug therapy: Secondary | ICD-10-CM | POA: Diagnosis not present

## 2020-12-16 DIAGNOSIS — N138 Other obstructive and reflux uropathy: Secondary | ICD-10-CM

## 2020-12-16 DIAGNOSIS — E559 Vitamin D deficiency, unspecified: Secondary | ICD-10-CM

## 2020-12-16 DIAGNOSIS — J449 Chronic obstructive pulmonary disease, unspecified: Secondary | ICD-10-CM

## 2020-12-18 ENCOUNTER — Encounter: Payer: Self-pay | Admitting: Internal Medicine

## 2020-12-18 ENCOUNTER — Other Ambulatory Visit: Payer: Self-pay | Admitting: Internal Medicine

## 2020-12-18 DIAGNOSIS — E1122 Type 2 diabetes mellitus with diabetic chronic kidney disease: Secondary | ICD-10-CM

## 2020-12-18 DIAGNOSIS — N179 Acute kidney failure, unspecified: Secondary | ICD-10-CM

## 2020-12-18 DIAGNOSIS — N1832 Chronic kidney disease, stage 3b: Secondary | ICD-10-CM

## 2020-12-18 MED ORDER — METOPROLOL SUCCINATE ER 50 MG PO TB24: ORAL_TABLET | ORAL | 0 refills | Status: DC

## 2020-12-18 NOTE — Progress Notes (Signed)
============================================================ - Test results slightly outside the reference range are not unusual. If there is anything important, I will review this with you,  otherwise it is considered normal test values.  If you have further questions,  please do not hesitate to contact me at the office or via My Chart.  ============================================================  -  PSA - Low - Great  ! ============================================================  - Kidney Functions (GFR) are worse - down from Stage 3a to now Stage 3b  - Most common cause is Dehydration, So . . . . . . . . . .  - Stop your Fluid Pills  (Furosemide Delorse Limber)  & also your Potassium  - Increase your water /Fluid Intake and . . . . . Marland Kitchen to Prevent                                                                             Permanent Kidney Damage  - Drink at least Equivalent of 6 bottles (16 oz) of water or liquids  /day  - That's 96 + (or more) of water /liquids /day  ============================================================  -  Then need to re-check in 2 weeks   - Please call office to schedule a Nurse Visit between Jan 17th  t o  Jan 21st  ============================================================  -  Total Chol = 97 and LDL Chol = 27   Excellent   - Very low risk for Heart Attack  / Stroke  - Please Decrease your Rosuvastatin /Crestor 40 mg down to 1/2 tablet Daily  ============================================================  - A1c 6.4 % - Still too High (Ideal or Goal is less than 5.7%)   -  Being diabetic has a  300% increased risk for heart attack,  stroke, cancer, and alzheimer- type vascular dementia.   -  It is very important that you work harder with diet by  avoiding all foods that are white except chicken,   fish & calliflower.  - Avoid white rice  (brown & wild rice is OK),   - Avoid white potatoes  (sweet potatoes in moderation is OK),   White  bread or wheat bread or anything made out of   white flour like bagels, donuts, rolls, buns, biscuits, cakes,  - pastries, cookies, pizza crust, and pasta (made from  white flour & egg whites)   - vegetarian pasta or spinach or wheat pasta is OK.  - Multigrain breads like Arnold's, Pepperidge Farm or   multigrain sandwich thins or high fiber breads like   E[place]ka bread or "Dave's Killer" breads that are  4 to 5 grams fiber per slice !  are best.    Diet, exercise and weight loss can reverse and cure  diabetes in the early stages.    - Diet, exercise and weight loss is very important in the   control and prevention of complications of diabetes which  affects every system in your body, ie.   -Brain - dementia/stroke,  - eyes - glaucoma/blindness,  - heart - heart attack/heart failure,  - kidneys - dialysis,  - stomach - gastric paralysis,  - intestines - malabsorption,  - nerves - severe painful neuritis,  - circulation - gangrene & loss of a leg(s)  -  and finally  . . . . . . . . . . . . . . . . . .    - cancer and Alzheimers. ==========================================================  -  Vitamin D = 91 - Excellent   - Vitamin D goal is between 70-100.   - It is very important as a natural anti-inflammatory and helping the  immune system protect against viral infections, like the Covid-19   helping hair, skin, and nails, as well as reducing stroke and  heart attack risk.   - It helps your bones and helps with mood.  - It also decreases numerous cancer risks so please  take it as directed.   - Low Vit D is associated with a 200-300% higher risk for  CANCER   and 200-300% higher risk for HEART   ATTACK  &  STROKE.    - It is also associated with higher death rate at younger ages,   autoimmune diseases like Rheumatoid arthritis, Lupus,  Multiple Sclerosis.     - Also many other serious conditions, like depression, Alzheimer's  Dementia, infertility, muscle  aches, fatigue, fibromyalgia   - just to name a few. ==========================================================  - All Else - CBC - Kidneys - Electrolytes - Liver - Magnesium & Thyroid    - all  Normal / OK ===========================================================

## 2020-12-20 LAB — CBC WITH DIFFERENTIAL/PLATELET
Absolute Monocytes: 479 cells/uL (ref 200–950)
Basophils Absolute: 61 cells/uL (ref 0–200)
Basophils Relative: 1.2 %
Eosinophils Absolute: 122 cells/uL (ref 15–500)
Eosinophils Relative: 2.4 %
HCT: 41.2 % (ref 38.5–50.0)
Hemoglobin: 14 g/dL (ref 13.2–17.1)
Lymphs Abs: 944 cells/uL (ref 850–3900)
MCH: 30.4 pg (ref 27.0–33.0)
MCHC: 34 g/dL (ref 32.0–36.0)
MCV: 89.6 fL (ref 80.0–100.0)
MPV: 12.1 fL (ref 7.5–12.5)
Monocytes Relative: 9.4 %
Neutro Abs: 3494 cells/uL (ref 1500–7800)
Neutrophils Relative %: 68.5 %
Platelets: 241 10*3/uL (ref 140–400)
RBC: 4.6 10*6/uL (ref 4.20–5.80)
RDW: 13 % (ref 11.0–15.0)
Total Lymphocyte: 18.5 %
WBC: 5.1 10*3/uL (ref 3.8–10.8)

## 2020-12-20 LAB — LIPID PANEL
Cholesterol: 97 mg/dL (ref <200)
HDL: 48 mg/dL (ref >=40)
LDL Cholesterol (Calc): 27 mg/dL (calc)
Non-HDL Cholesterol (Calc): 49 mg/dL (calc) (ref <130)
Total CHOL/HDL Ratio: 2 (calc) (ref <5.0)
Triglycerides: 138 mg/dL (ref <150)

## 2020-12-20 LAB — URINALYSIS, ROUTINE W REFLEX MICROSCOPIC
Bilirubin Urine: NEGATIVE
Glucose, UA: NEGATIVE
Hgb urine dipstick: NEGATIVE
Ketones, ur: NEGATIVE
Leukocytes,Ua: NEGATIVE
Nitrite: NEGATIVE
Protein, ur: NEGATIVE
Specific Gravity, Urine: 1.013 (ref 1.001–1.03)
pH: <=5 (ref 5.0–8.0)

## 2020-12-20 LAB — COMPLETE METABOLIC PANEL WITH GFR
AG Ratio: 2.2 (calc) (ref 1.0–2.5)
ALT: 11 U/L (ref 9–46)
AST: 14 U/L (ref 10–35)
Albumin: 4.3 g/dL (ref 3.6–5.1)
Alkaline phosphatase (APISO): 80 U/L (ref 35–144)
BUN/Creatinine Ratio: 12 (calc) (ref 6–22)
BUN: 25 mg/dL (ref 7–25)
CO2: 27 mmol/L (ref 20–32)
Calcium: 9.2 mg/dL (ref 8.6–10.3)
Chloride: 98 mmol/L (ref 98–110)
Creat: 2.05 mg/dL — ABNORMAL HIGH (ref 0.70–1.25)
GFR, Est African American: 38 mL/min/{1.73_m2} — ABNORMAL LOW (ref >=60)
GFR, Est Non African American: 33 mL/min/{1.73_m2} — ABNORMAL LOW (ref >=60)
Globulin: 2 g/dL (calc) (ref 1.9–3.7)
Glucose, Bld: 111 mg/dL — ABNORMAL HIGH (ref 65–99)
Potassium: 4.6 mmol/L (ref 3.5–5.3)
Sodium: 136 mmol/L (ref 135–146)
Total Bilirubin: 0.6 mg/dL (ref 0.2–1.2)
Total Protein: 6.3 g/dL (ref 6.1–8.1)

## 2020-12-20 LAB — MICROALBUMIN / CREATININE URINE RATIO
Creatinine, Urine: 136 mg/dL (ref 20–320)
Microalb Creat Ratio: 4 mcg/mg creat (ref <30)
Microalb, Ur: 0.5 mg/dL

## 2020-12-20 LAB — HEMOGLOBIN A1C
Hgb A1c MFr Bld: 6.4 % of total Hgb — ABNORMAL HIGH (ref <5.7)
Mean Plasma Glucose: 137 mg/dL
eAG (mmol/L): 7.6 mmol/L

## 2020-12-20 LAB — TSH: TSH: 1.46 mIU/L (ref 0.40–4.50)

## 2020-12-20 LAB — VITAMIN D 25 HYDROXY (VIT D DEFICIENCY, FRACTURES): Vit D, 25-Hydroxy: 91 ng/mL (ref 30–100)

## 2020-12-20 LAB — INSULIN, RANDOM: Insulin: 9.9 u[IU]/mL

## 2020-12-20 LAB — PSA: PSA: 0.75 ng/mL (ref <=4.0)

## 2020-12-20 LAB — MAGNESIUM: Magnesium: 1.7 mg/dL (ref 1.5–2.5)

## 2020-12-25 ENCOUNTER — Other Ambulatory Visit: Payer: Self-pay | Admitting: Internal Medicine

## 2021-01-06 ENCOUNTER — Other Ambulatory Visit: Payer: Self-pay | Admitting: Physician Assistant

## 2021-01-06 ENCOUNTER — Other Ambulatory Visit: Payer: Self-pay | Admitting: Internal Medicine

## 2021-01-06 DIAGNOSIS — E785 Hyperlipidemia, unspecified: Secondary | ICD-10-CM

## 2021-01-09 ENCOUNTER — Other Ambulatory Visit: Payer: Self-pay | Admitting: Internal Medicine

## 2021-01-13 ENCOUNTER — Encounter: Payer: Self-pay | Admitting: Cardiovascular Disease

## 2021-01-13 NOTE — Progress Notes (Signed)
This encounter was created in error - please disregard.

## 2021-01-14 ENCOUNTER — Encounter: Payer: Self-pay | Admitting: Cardiovascular Disease

## 2021-02-05 ENCOUNTER — Other Ambulatory Visit: Payer: Self-pay | Admitting: Cardiovascular Disease

## 2021-02-07 NOTE — Telephone Encounter (Signed)
Pt last saw Robbie Lis, PA on 10/28/19, pt is overdue for follow-up.  Pt was due in March 2021 for 6 month follow-up with Dr Acie Fredrickson.  Message sent to schedulers to call pt to schedule follow-up OV.  Last labs 12/16/20 Creat 2.05, age 70, weight 116kg, based on specified criteria pt is on appropriate dosage of Eliquis 5mg  BID for afib.  Will await appt to refill rx.

## 2021-03-04 ENCOUNTER — Encounter: Payer: Self-pay | Admitting: Cardiovascular Disease

## 2021-03-11 ENCOUNTER — Ambulatory Visit: Payer: PPO | Admitting: Adult Health

## 2021-03-18 ENCOUNTER — Other Ambulatory Visit: Payer: Self-pay | Admitting: Internal Medicine

## 2021-03-18 NOTE — Progress Notes (Deleted)
FOLLOW UP  Assessment and Plan:   Essential hypertension - continue medications, DASH diet, exercise and monitor at home. Call if greater than 130/80.  -     CBC with Differential/Platelet -     COMPLETE METABOLIC PANEL WITH GFR -     TSH  Chronic combined systolic and diastolic CHF (congestive heart failure) ([name]) Will follow up cardiology, declines additional meds like farxiga Weight is up but he denies SOB, leg swelling etc will monitor at home. *** Disease process and medications discussed. Questions answered fully. Emphasized salt restriction, less than 2000mg  a day. Encouraged daily monitoring of the patient's weight, call office if 5 lb weight loss or gain in a day.  Encouraged regular exercise. If any increasing shortness of breath, swelling, or chest pressure go to ER immediately.  decrease your fluid intake to less than 2 L daily please remember to always increase your potassium intake with any increase of your fluid pill.   Persistent atrial fibrillation Continue medications, rate controlled, follow up cardio  Chronic obstructive pulmonary disease, unspecified COPD type ([name]) Advised to stop smoking - risks discussed. Declines medications.  CXR annually versus CT ***  Type 2 diabetes mellitus with stage 2 chronic kidney disease, without long-term current use of insulin ([name]) Discussed general issues about diabetes pathophysiology and management., Educational material distributed., Suggested low cholesterol diet., Encouraged aerobic exercise., Discussed foot care., Reminded to get yearly retinal exam. Declines addition of farxiga for CHF and DM -     Hemoglobin A1c  Coronary artery disease involving coronary bypass graft of native heart with unstable angina pectoris ([name]) Control blood pressure, cholesterol, glucose, increase exercise.  Continue cardio follow up Suggest stop smoking but he states, "something has to kill him"  Hypothyroidism, unspecified type continue  medications the same pending lab results reminded to take on an empty stomach 30-76mins before food.  -     TSH  Mixed hyperlipidemia check lipids decrease fatty foods increase activity.  -     Lipid panel  Tobacco abuse Not interested in quitting, not interested in life prolonging measures ***  Medication management -     Magnesium  Vitamin D deficiency At goal at recent check; continue to recommend supplementation for goal of 60-100 Defer vitamin D level   Continue diet and meds as discussed. Further disposition pending results of labs. Discussed med's effects and SE's.   Over 30 minutes of exam, counseling, chart review, and critical decision making was performed.   Future Appointments  Date Time Provider [place]  03/21/2021  4:00 PM Liane Comber, NP [facility] None  06/27/2021  4:00 PM Unk Pinto, MD [facility] None  01/04/2022 10:00 AM Unk Pinto, MD [facility] None    ----------------------------------------------------------------------------------------------------------------------  HPI 70 y.o. male  presents for 3 month follow up on CAD, CHF, hypertension, cholesterol, T2 diabetes, hypothyroid, obesity and vitamin D deficiency.   Wellbutrin *** no dx ? For smoking  BMI is There is no height or weight on file to calculate BMI., he is working on diet and exercise. Wt Readings from Last 3 Encounters:  12/16/20 255 lb 11 oz (116 kg)  11/17/19 260 lb 3.2 oz (118 kg)  10/28/19 260 lb (117.9 kg)   he unfortunately currently continues to smoke but has cut down from 2 packs to 1.5 packs a day; discussed risks associated with smoking, patient is not willing to quit, declines medications to assist. Last CXR 10/2018; no CT ***  HTN predates since 2005 when admitted with  ACS and underwent PCA /DES. Then in 2013, he underwent CABG. In 2018, he had a 2sd NSTEMI with DES implantedand also had CHF during that admission.  In 2019 , he presented with pAfib  and Brilinta wa changed to Eliquis. In 2019, he was hospitalized with CHF (EF 30-35%) In Oct 2020, he has CV by Dr Cathie Olden. Patient is on Entresto for Dx of chronic sys CHF.  ***  His blood pressure has been controlled at home, today their BP is     He does not workout. He denies chest pain, he does endorse exertional dyspnea, and some dizziness   He is on cholesterol medication (rosuvastatin 40 mg daily, zetia 10 mg daily ***) and denies myalgias. His cholesterol is at goal. The cholesterol last visit was:   Lab Results  Component Value Date   CHOL 97 12/16/2020   HDL 48 12/16/2020   LDLCALC 27 12/16/2020   TRIG 138 12/16/2020   CHOLHDL 2.0 12/16/2020    He has been working on diet for T2DM on metformin 500 mg x 1 *** tabs, and denies foot ulcerations, increased appetite, nausea, paresthesia of the feet, polydipsia, polyuria, visual disturbances and vomiting. He does not check sugars at home. Last A1C in the office was:  Lab Results  Component Value Date   HGBA1C 6.4 (H) 12/16/2020    *** Lab Results  Component Value Date   GFRNONAA 33 (L) 12/16/2020   GFRNONAA 65 11/17/2019   GFRNONAA 63 11/12/2019    He is on thyroid medication. His medication was not changed last visit, he is on 1 a day of 147mcg Lab Results  Component Value Date   TSH 1.46 12/16/2020   Patient is on Vitamin D supplement.   Lab Results  Component Value Date   VD25OH 91 12/16/2020        Current Medications:  Current Outpatient Medications on File Prior to Visit  Medication Sig  . aspirin 81 MG chewable tablet Chew 1 tablet (81 mg total) daily by mouth.  Marland Kitchen buPROPion (WELLBUTRIN XL) 300 MG 24 hr tablet Take      1 tablet      Daily       for Mood, Focus & Concentration.  Marland Kitchen ELIQUIS 5 MG TABS tablet TAKE 1 TABLET (5 MG TOTAL) BY MOUTH 2 (TWO) TIMES DAILY. NEED APPT WITH CARDIOLOGIST FOR REFILLS  . ezetimibe (ZETIA) 10 MG tablet Take   1 tablet   Daily    for Cholesterol  . ferrous sulfate 325 (65 FE) MG  tablet Take 325 mg by mouth every Monday, Wednesday, and Friday.  Marland Kitchen FLUoxetine (PROZAC) 40 MG capsule Take     1 capsule      Daily      for Mood  . furosemide (LASIX) 40 MG tablet Take 1 tablet daily for BP & Fluid (Dx: i10)  . isosorbide mononitrate (IMDUR) 30 MG 24 hr tablet TAKE 1 TABLET DAILY FOR HEART  . levothyroxine (SYNTHROID) 100 MCG tablet Take      1 tablet      Daily     on an empty stomach with only water for 30 minutes & no Antacid meds, Calcium or Magnesium for 4 hours & avoid Biotin  . magnesium oxide (MAG-OX) 400 MG tablet Take 400 mg by mouth daily.  . metFORMIN (GLUCOPHAGE-XR) 500 MG 24 hr tablet Take   2 tablets   2 x /day   with Meals  for Diabetes  . metoprolol succinate (TOPROL-XL)  50 MG 24 hr tablet Take     1 tablet     Daily      for BP & Heart  . nitroGLYCERIN (NITROSTAT) 0.4 MG SL tablet Place 1 tablet (0.4 mg total) every 5 (five) minutes as needed under the tongue.  . Omega-3 Fatty Acids (FISH OIL) 1000 MG CAPS Take 2,000 mg by mouth daily.  Marland Kitchen omeprazole (PRILOSEC) 40 MG capsule TAKE 1 CAPSULE DAILY FOR ACID INDIGESTION & REFLUX  . potassium chloride SA (K-DUR,KLOR-CON) 20 MEQ tablet Take 20 mEq by mouth daily.   . rosuvastatin (CRESTOR) 40 MG tablet TAKE 1 TABLET BY MOUTH EVERY DAY FOR CHOLESTEROL  . sacubitril-valsartan (ENTRESTO) 49-51 MG Take 1 tablet by mouth 2 (two) times daily. Please keep upcoming appt in January 2022 with Dr. Acie Fredrickson before anymore refills. Thank you  . vitamin B-12 (CYANOCOBALAMIN) 1000 MCG tablet Take 1,000 mcg by mouth daily.  . Vitamin D, Ergocalciferol, (DRISDOL) 50000 UNITS CAPS capsule Take 1 capsule (50,000 Units total) by mouth 3 (three) times a week. (Patient taking differently: Take 50,000 Units by mouth once a week.)   No current facility-administered medications on file prior to visit.     Allergies:  Allergies  Allergen Reactions  . Lisinopril Cough     Medical History:  Past Medical History:  Diagnosis Date  . Acute  inferior myocardial infarction ([place]) 10/24/2017  . Adenomatous colon polyp 02/15/2009  . Bradycardia   . CAD (coronary artery disease)    remote MI in 2004 with PCI; s/p CABG x 3 03/2012  . CHF (congestive heart failure) ([place])   . COPD (chronic obstructive pulmonary disease) ([place])   . Depression   . Family history of malignant neoplasm of gastrointestinal tract   . GERD (gastroesophageal reflux disease)   . Hyperlipidemia   . Hypertension   . Hypothyroidism   . Myocardial infarction ([place])    2004  . Obesity   . Prediabetes   . 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 Tobacco abuse 03/28/2012  . Vitamin D deficiency    Family history- Reviewed and unchanged Social history- Reviewed and unchanged  ***  Review of Systems:  Review of Systems  Constitutional: Negative for malaise/fatigue and weight loss.  HENT: Negative for hearing loss and tinnitus.   Eyes: Negative for blurred vision and double vision.  Respiratory: Negative for cough, shortness of breath and wheezing.   Cardiovascular: Negative for chest pain, palpitations, orthopnea, claudication and leg swelling.  Gastrointestinal: Negative for abdominal pain, blood in stool, constipation, diarrhea, heartburn, melena, nausea and vomiting.  Genitourinary: Negative.   Musculoskeletal: Negative for joint pain and myalgias.  Skin: Negative for rash.  Neurological: Positive for dizziness (with standing). Negative for tingling, sensory change, weakness and headaches.  Endo/Heme/Allergies: Negative for polydipsia.  Psychiatric/Behavioral: Negative.   All other systems reviewed and are negative.   Physical Exam: There were no vitals taken for this visit. Wt Readings from Last 3 Encounters:  12/16/20 255 lb 11 oz (116 kg)  11/17/19 260 lb 3.2 oz (118 kg)  10/28/19 260 lb (117.9 kg)   General Appearance: Well nourished, in no apparent distress. Eyes: PERRLA, EOMs, conjunctiva  no swelling or erythema Sinuses: No Frontal/maxillary tenderness ENT/Mouth: Ext aud canals clear, TMs without erythema, bulging. No erythema, swelling, or exudate on post pharynx.  Tonsils not swollen or erythematous. Hearing normal. Very poor dentition with missing  teeth.  Neck: Supple, thyroid normal.  Respiratory: Respiratory effort normal, BS equal bilaterally without rales, rhonchi, wheezing or stridor.  Cardio: RRR with no MRGs. Brisk peripheral pulses with 1+ edema Abdomen: Soft, + BS.  Non tender, no guarding, rebound, hernias, masses. Lymphatics: Non tender without lymphadenopathy.  Musculoskeletal: Full ROM, 5/5 strength, Normal gait Skin: Warm, dry without rashes, lesions, ecchymosis.  Neuro: Cranial nerves intact. No cerebellar symptoms.  Psych: Awake and oriented X 3, normal affect, Insight and Judgment appropriate.    Curtis Ribas, NP 1:55 PM [facility]

## 2021-03-21 ENCOUNTER — Ambulatory Visit: Payer: PPO | Admitting: Adult Health

## 2021-03-21 DIAGNOSIS — I25700 Atherosclerosis of coronary artery bypass graft(s), unspecified, with unstable angina pectoris: Secondary | ICD-10-CM

## 2021-03-21 DIAGNOSIS — Z720 Tobacco use: Secondary | ICD-10-CM

## 2021-03-21 DIAGNOSIS — I5022 Chronic systolic (congestive) heart failure: Secondary | ICD-10-CM

## 2021-03-21 DIAGNOSIS — E1122 Type 2 diabetes mellitus with diabetic chronic kidney disease: Secondary | ICD-10-CM

## 2021-03-21 DIAGNOSIS — I10 Essential (primary) hypertension: Secondary | ICD-10-CM

## 2021-03-21 DIAGNOSIS — I4891 Unspecified atrial fibrillation: Secondary | ICD-10-CM

## 2021-03-21 DIAGNOSIS — E669 Obesity, unspecified: Secondary | ICD-10-CM

## 2021-03-21 DIAGNOSIS — J439 Emphysema, unspecified: Secondary | ICD-10-CM

## 2021-03-21 DIAGNOSIS — Z79899 Other long term (current) drug therapy: Secondary | ICD-10-CM

## 2021-03-21 DIAGNOSIS — E559 Vitamin D deficiency, unspecified: Secondary | ICD-10-CM

## 2021-03-21 DIAGNOSIS — E1169 Type 2 diabetes mellitus with other specified complication: Secondary | ICD-10-CM

## 2021-03-21 DIAGNOSIS — E039 Hypothyroidism, unspecified: Secondary | ICD-10-CM

## 2021-03-25 ENCOUNTER — Other Ambulatory Visit: Payer: Self-pay | Admitting: Cardiovascular Disease

## 2021-03-25 ENCOUNTER — Other Ambulatory Visit: Payer: Self-pay | Admitting: Internal Medicine

## 2021-03-25 DIAGNOSIS — E785 Hyperlipidemia, unspecified: Secondary | ICD-10-CM

## 2021-04-26 ENCOUNTER — Emergency Department (HOSPITAL_COMMUNITY): Payer: PPO

## 2021-04-26 ENCOUNTER — Other Ambulatory Visit: Payer: Self-pay

## 2021-04-26 ENCOUNTER — Emergency Department (HOSPITAL_COMMUNITY)
Admission: EM | Admit: 2021-04-26 | Discharge: 2021-04-26 | Disposition: A | Discharge status: Home / Self Care | Payer: PPO | Attending: Emergency Medicine | Admitting: Emergency Medicine

## 2021-04-26 DIAGNOSIS — N289 Disorder of kidney and ureter, unspecified: Secondary | ICD-10-CM | POA: Diagnosis not present

## 2021-04-26 DIAGNOSIS — I2510 Atherosclerotic heart disease of native coronary artery without angina pectoris: Secondary | ICD-10-CM | POA: Diagnosis not present

## 2021-04-26 DIAGNOSIS — I5022 Chronic systolic (congestive) heart failure: Secondary | ICD-10-CM | POA: Insufficient documentation

## 2021-04-26 DIAGNOSIS — E1122 Type 2 diabetes mellitus with diabetic chronic kidney disease: Secondary | ICD-10-CM | POA: Diagnosis not present

## 2021-04-26 DIAGNOSIS — F17210 Nicotine dependence, cigarettes, uncomplicated: Secondary | ICD-10-CM | POA: Insufficient documentation

## 2021-04-26 DIAGNOSIS — E039 Hypothyroidism, unspecified: Secondary | ICD-10-CM | POA: Diagnosis not present

## 2021-04-26 DIAGNOSIS — I10 Essential (primary) hypertension: Secondary | ICD-10-CM | POA: Diagnosis not present

## 2021-04-26 DIAGNOSIS — I5023 Acute on chronic systolic (congestive) heart failure: Secondary | ICD-10-CM | POA: Diagnosis not present

## 2021-04-26 DIAGNOSIS — Z79899 Other long term (current) drug therapy: Secondary | ICD-10-CM | POA: Insufficient documentation

## 2021-04-26 DIAGNOSIS — Z955 Presence of coronary angioplasty implant and graft: Secondary | ICD-10-CM | POA: Insufficient documentation

## 2021-04-26 DIAGNOSIS — I252 Old myocardial infarction: Secondary | ICD-10-CM | POA: Diagnosis not present

## 2021-04-26 DIAGNOSIS — I129 Hypertensive chronic kidney disease with stage 1 through stage 4 chronic kidney disease, or unspecified chronic kidney disease: Secondary | ICD-10-CM | POA: Insufficient documentation

## 2021-04-26 DIAGNOSIS — Z20822 Contact with and (suspected) exposure to covid-19: Secondary | ICD-10-CM | POA: Diagnosis not present

## 2021-04-26 DIAGNOSIS — Z7901 Long term (current) use of anticoagulants: Secondary | ICD-10-CM | POA: Diagnosis not present

## 2021-04-26 DIAGNOSIS — J449 Chronic obstructive pulmonary disease, unspecified: Secondary | ICD-10-CM | POA: Insufficient documentation

## 2021-04-26 DIAGNOSIS — I447 Left bundle-branch block, unspecified: Secondary | ICD-10-CM | POA: Diagnosis not present

## 2021-04-26 DIAGNOSIS — I110 Hypertensive heart disease with heart failure: Secondary | ICD-10-CM | POA: Diagnosis not present

## 2021-04-26 DIAGNOSIS — N182 Chronic kidney disease, stage 2 (mild): Secondary | ICD-10-CM | POA: Insufficient documentation

## 2021-04-26 DIAGNOSIS — R062 Wheezing: Secondary | ICD-10-CM | POA: Diagnosis not present

## 2021-04-26 DIAGNOSIS — R0602 Shortness of breath: Secondary | ICD-10-CM | POA: Insufficient documentation

## 2021-04-26 DIAGNOSIS — Z7984 Long term (current) use of oral hypoglycemic drugs: Secondary | ICD-10-CM | POA: Insufficient documentation

## 2021-04-26 DIAGNOSIS — R0902 Hypoxemia: Secondary | ICD-10-CM | POA: Diagnosis not present

## 2021-04-26 DIAGNOSIS — Z7982 Long term (current) use of aspirin: Secondary | ICD-10-CM | POA: Insufficient documentation

## 2021-04-26 LAB — COMPREHENSIVE METABOLIC PANEL
ALT: 32 U/L (ref 0–44)
AST: 36 U/L (ref 15–41)
Albumin: 3.1 g/dL — ABNORMAL LOW (ref 3.5–5.0)
Alkaline Phosphatase: 86 U/L (ref 38–126)
Anion gap: 9 (ref 5–15)
BUN: 18 mg/dL (ref 8–23)
CO2: 27 mmol/L (ref 22–32)
Calcium: 8.9 mg/dL (ref 8.9–10.3)
Chloride: 97 mmol/L — ABNORMAL LOW (ref 98–111)
Creatinine, Ser: 1.56 mg/dL — ABNORMAL HIGH (ref 0.61–1.24)
GFR, Estimated: 48 mL/min — ABNORMAL LOW (ref >60)
Glucose, Bld: 114 mg/dL — ABNORMAL HIGH (ref 70–99)
Potassium: 5.6 mmol/L — ABNORMAL HIGH (ref 3.5–5.1)
Sodium: 133 mmol/L — ABNORMAL LOW (ref 135–145)
Total Bilirubin: 1 mg/dL (ref 0.3–1.2)
Total Protein: 5.5 g/dL — ABNORMAL LOW (ref 6.5–8.1)

## 2021-04-26 LAB — BASIC METABOLIC PANEL
Anion gap: 9 (ref 5–15)
BUN: 18 mg/dL (ref 8–23)
CO2: 26 mmol/L (ref 22–32)
Calcium: 8.8 mg/dL — ABNORMAL LOW (ref 8.9–10.3)
Chloride: 96 mmol/L — ABNORMAL LOW (ref 98–111)
Creatinine, Ser: 1.51 mg/dL — ABNORMAL HIGH (ref 0.61–1.24)
GFR, Estimated: 50 mL/min — ABNORMAL LOW (ref >60)
Glucose, Bld: 101 mg/dL — ABNORMAL HIGH (ref 70–99)
Potassium: 4.8 mmol/L (ref 3.5–5.1)
Sodium: 131 mmol/L — ABNORMAL LOW (ref 135–145)

## 2021-04-26 LAB — CBC WITH DIFFERENTIAL/PLATELET
Abs Immature Granulocytes: 0.05 10*3/uL (ref 0.00–0.07)
Basophils Absolute: 0 10*3/uL (ref 0.0–0.1)
Basophils Relative: 0 %
Eosinophils Absolute: 0.1 10*3/uL (ref 0.0–0.5)
Eosinophils Relative: 1 %
HCT: 36.2 % — ABNORMAL LOW (ref 39.0–52.0)
Hemoglobin: 11.9 g/dL — ABNORMAL LOW (ref 13.0–17.0)
Immature Granulocytes: 1 %
Lymphocytes Relative: 6 %
Lymphs Abs: 0.7 10*3/uL (ref 0.7–4.0)
MCH: 29 pg (ref 26.0–34.0)
MCHC: 32.9 g/dL (ref 30.0–36.0)
MCV: 88.1 fL (ref 80.0–100.0)
Monocytes Absolute: 0.8 10*3/uL (ref 0.1–1.0)
Monocytes Relative: 8 %
Neutro Abs: 9.1 10*3/uL — ABNORMAL HIGH (ref 1.7–7.7)
Neutrophils Relative %: 84 %
Platelets: 183 10*3/uL (ref 150–400)
RBC: 4.11 MIL/uL — ABNORMAL LOW (ref 4.22–5.81)
RDW: 13.9 % (ref 11.5–15.5)
WBC: 10.8 10*3/uL — ABNORMAL HIGH (ref 4.0–10.5)
nRBC: 0 % (ref 0.0–0.2)

## 2021-04-26 LAB — RESP PANEL BY RT-PCR (FLU A&B, COVID) ARPGX2
Influenza A by PCR: NEGATIVE
Influenza B by PCR: NEGATIVE
SARS Coronavirus 2 by RT PCR: NEGATIVE

## 2021-04-26 LAB — BRAIN NATRIURETIC PEPTIDE: B Natriuretic Peptide: 1171.7 pg/mL — ABNORMAL HIGH (ref 0.0–100.0)

## 2021-04-26 LAB — TROPONIN I (HIGH SENSITIVITY)
Troponin I (High Sensitivity): 41 ng/L — ABNORMAL HIGH (ref <18)
Troponin I (High Sensitivity): 42 ng/L — ABNORMAL HIGH (ref <18)

## 2021-04-26 LAB — LIPASE, BLOOD: Lipase: 20 U/L (ref 11–51)

## 2021-04-26 MED ORDER — FUROSEMIDE 10 MG/ML IJ SOLN
40.0000 mg | Freq: Once | INTRAMUSCULAR | Status: AC
  Administered 2021-04-26: 40 mg via INTRAVENOUS
  Filled 2021-04-26: qty 4

## 2021-04-26 NOTE — ED Notes (Signed)
Pt is ambulatory to the bathroom.

## 2021-04-26 NOTE — ED Notes (Signed)
Received verbal report from Alberto at this time 

## 2021-04-26 NOTE — ED Notes (Signed)
Pt ambulated on room air. O2 sat remained 95-96%

## 2021-04-26 NOTE — ED Provider Notes (Signed)
[facility] Provider Note   CSN: [id number] Arrival date & time: 04/26/21  1458     History Chief Complaint  Patient presents with  . Shortness of Breath    [facility] is a 70 y.o. male history of previous MI, CAD, heart failure, here presenting with shortness of breath.  Patient has acute onset of shortness of breath since last night.  He states that it is worse when he walks.  He states that his legs are also swollen.  He has baseline CKD and he was told to be off of his Lasix for several months so has not been taking it.  Denies any fevers or chills or cough.  Denies any COVID exposure.   The history is provided by the patient.       Past Medical History:  Diagnosis Date  . Acute inferior myocardial infarction ([place]) 10/24/2017  . Adenomatous colon polyp 02/15/2009  . Bradycardia   . CAD (coronary artery disease)    remote MI in 2004 with PCI; s/p CABG x 3 03/2012  . CHF (congestive heart failure) ([place])   . COPD (chronic obstructive pulmonary disease) ([place])   . Depression   . Family history of malignant neoplasm of gastrointestinal tract   . GERD (gastroesophageal reflux disease)   . Hyperlipidemia   . Hypertension   . Hypothyroidism   . Myocardial infarction ([place])    2004  . Obesity   . Prediabetes   . 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 Tobacco abuse 03/28/2012  . Vitamin D deficiency     Patient Active Problem List   Diagnosis Date Noted  . Hyperlipidemia associated with type 2 diabetes mellitus ([place]) 11/16/2019  . Elevated transaminase level 10/31/2018  . Chronic obstructive pulmonary disease ([place])   . Dyspnea on exertion 10/30/2018  . Atrial fibrillation ([place]) 10/15/2018  . Hypothyroidism 11/18/2017  . Chronic systolic CHF (congestive heart failure) ([place]) 11/18/2017  . Coronary artery disease involving coronary bypass graft of native heart with unstable angina  pectoris ([place])   . History of MI (myocardial infarction) 05/20/2017  . Rectus sheath hematoma 08/08/2014  . Medication management 03/30/2014  . Coronary artery disease involving native coronary artery of native heart without angina pectoris 12/25/2013  . Type 2 diabetes mellitus with stage 2 chronic kidney disease, without long-term current use of insulin ([place])   . GERD (gastroesophageal reflux disease)   . Vitamin D deficiency   . Obesity (BMI 30.0-34.9)   . 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 Essential hypertension 03/28/2012  . Tobacco abuse 03/28/2012  . COLONIC POLYPS, ADENOMATOUS, HX OF 02/19/2009    Past Surgical History:  Procedure Laterality Date  . BACK SURGERY    . CARDIOVERSION N/A 10/10/2019   Procedure: CARDIOVERSION;  Surgeon: Pixie Casino, MD;  Location: [place];  Service: Cardiovascular;  Laterality: N/A;  . COLONOSCOPY  02/15/2009  . CORONARY ANGIOPLASTY WITH STENT PLACEMENT     about 8 years ago at [facility]  . CORONARY ARTERY BYPASS GRAFT  04/02/2012   Procedure: CORONARY ARTERY BYPASS GRAFTING (CABG);  Surgeon: Rexene Alberts, MD;  Location: [place];  Service: Open Heart Surgery;  Laterality: N/A;  On pump, times three graphs using endoscopically harvested right greater saphenous vein and left internal mammary artery.   [facility] [place] ACUTE MI REVASCULARIZATION N/A 10/23/2017   Procedure: Coronary/Graft Acute  MI Revascularization;  Surgeon: Jettie Booze, MD;  Location: [place] [facility];  Service: Cardiovascular;  Laterality: N/A;  . KNEE SURGERY    . LEFT HEART CATH AND CORS/GRAFTS ANGIOGRAPHY N/A 10/23/2017   Procedure: LEFT HEART CATH AND CORS/GRAFTS ANGIOGRAPHY;  Surgeon: Jettie Booze, MD;  Location: [place] [facility];  Service: Cardiovascular;  Laterality: N/A;  . TONSILLECTOMY         Family History  Problem Relation Age of Onset  . Hypertension Mother   . Alzheimer's disease Mother   . Colon cancer Father   . Colon cancer  Paternal Grandmother   . Esophageal cancer Neg Hx   . Rectal cancer Neg Hx   . Stomach cancer Neg Hx     Social History   Tobacco Use  . Smoking status: Current Every Day Smoker    Packs/day: 2.00    Years: 40.00    Pack years: 80.00    Types: Cigarettes  . Smokeless tobacco: Never Used  . Tobacco comment: down from 2ppd  Vaping Use  . Vaping Use: Never used  Substance Use Topics  . Alcohol use: No    Alcohol/week: 2.0 standard drinks    Types: 1 Glasses of wine, 1 Shots of liquor per week    Comment: Rarely  . Drug use: No    Home Medications Prior to Admission medications   Medication Sig Start Date End Date Taking? Authorizing Provider  aspirin 81 MG chewable tablet Chew 1 tablet (81 mg total) daily by mouth. 10/27/17   Reino Bellis B, NP  buPROPion (WELLBUTRIN XL) 300 MG 24 hr tablet Take      1 tablet      Daily       for Mood, Focus & Concentration. 10/08/20   Unk Pinto, MD  ELIQUIS 5 MG TABS tablet TAKE 1 TABLET (5 MG TOTAL) BY MOUTH 2 (TWO) TIMES DAILY. NEED APPT WITH CARDIOLOGIST FOR REFILLS 02/11/21   Marcelle Overlie D, RPH-CPP  ezetimibe (ZETIA) 10 MG tablet TAKE 1 TABLET BY MOUTH EVERY DAY FOR CHOLESTEROL 03/26/21   Liane Comber, NP  ferrous sulfate 325 (65 FE) MG tablet Take 325 mg by mouth every Monday, Wednesday, and Friday.    [provider]  FLUoxetine (PROZAC) 40 MG capsule Take     1 capsule      Daily      for Mood 10/08/20   Unk Pinto, MD  furosemide (LASIX) 40 MG tablet Take 1 tablet daily for BP & Fluid (Dx: i10) 09/20/20   Unk Pinto, MD  isosorbide mononitrate (IMDUR) 30 MG 24 hr tablet TAKE 1 TABLET DAILY FOR HEART 12/25/20   Liane Comber, NP  levothyroxine (SYNTHROID) 100 MCG tablet Take      1 tablet      Daily     on an empty stomach with only water for 30 minutes & no Antacid meds, Calcium or Magnesium for 4 hours & avoid Biotin 10/08/20   Unk Pinto, MD  magnesium oxide (MAG-OX) 400 MG tablet Take 400 mg by  mouth daily.    [provider]  metFORMIN (GLUCOPHAGE-XR) 500 MG 24 hr tablet TAKE 2 TABLETS 2 X /DAY WITH MEALS FOR DIABETES 03/26/21   Liane Comber, NP  metoprolol succinate (TOPROL-XL) 50 MG 24 hr tablet Take  1 tablet  Daily  for BP & Heart 03/18/21   Unk Pinto, MD  nitroGLYCERIN (NITROSTAT) 0.4 MG SL tablet Place 1 tablet (0.4 mg total) every 5 (five)  minutes as needed under the tongue. 10/26/17   Cheryln Manly, NP  Omega-3 Fatty Acids (FISH OIL) 1000 MG CAPS Take 2,000 mg by mouth daily.    [provider]  omeprazole (PRILOSEC) 40 MG capsule TAKE 1 CAPSULE DAILY FOR ACID INDIGESTION & REFLUX 06/26/20   Unk Pinto, MD  potassium chloride SA (K-DUR,KLOR-CON) 20 MEQ tablet Take 20 mEq by mouth daily.     [provider]  rosuvastatin (CRESTOR) 40 MG tablet TAKE 1 TABLET BY MOUTH EVERY DAY FOR CHOLESTEROL 12/25/20   Liane Comber, NP  sacubitril-valsartan (ENTRESTO) 49-51 MG Take 1 tablet by mouth 2 (two) times daily. Please keep upcoming appt in January 2022 with Dr. Acie Fredrickson before anymore refills. Thank you 01/07/21   Nahser, Wonda Cheng, MD  vitamin B-12 (CYANOCOBALAMIN) 1000 MCG tablet Take 1,000 mcg by mouth daily.    [provider]  Vitamin D, Ergocalciferol, (DRISDOL) 50000 UNITS CAPS capsule Take 1 capsule (50,000 Units total) by mouth 3 (three) times a week. Patient taking differently: Take 50,000 Units by mouth once a week. 09/23/15   Rolene Course, PA-C    Allergies    Lisinopril  Review of Systems   Review of Systems  Respiratory: Positive for shortness of breath.   All other systems reviewed and are negative.   Physical Exam Updated Vital Signs BP 130/65   Pulse (!) 58   Temp (!) 97.5 F (36.4 C) (Oral)   Resp (!) 22   Ht 6\' 3"  (1.905 m)   Wt 111.1 kg   SpO2 96%   BMI 30.62 kg/m   Physical Exam Vitals and nursing note reviewed.  Constitutional:      Appearance: He is well-developed.  HENT:     Head:  Normocephalic.  Eyes:     Extraocular Movements: Extraocular movements intact.     Pupils: Pupils are equal, round, and reactive to light.  Cardiovascular:     Rate and Rhythm: Normal rate and regular rhythm.  Pulmonary:     Comments: Tachypneic, diminished bilaterally  Abdominal:     General: Bowel sounds are normal.     Palpations: Abdomen is soft.  Musculoskeletal:     Cervical back: Normal range of motion and neck supple.     Comments: 1+ edema   Skin:    General: Skin is warm.     Capillary Refill: Capillary refill takes less than 2 seconds.  Neurological:     General: No focal deficit present.     Mental Status: He is alert and oriented to person, place, and time.  Psychiatric:        Mood and Affect: Mood normal.        Behavior: Behavior normal.     ED Results / Procedures / Treatments   Labs (all labs ordered are listed, but only abnormal results are displayed) Labs Reviewed  CBC WITH DIFFERENTIAL/PLATELET - Abnormal; Notable for the following components:      Result Value   WBC 10.8 (*)    RBC 4.11 (*)    Hemoglobin 11.9 (*)    HCT 36.2 (*)    Neutro Abs 9.1 (*)    All other components within normal limits  COMPREHENSIVE METABOLIC PANEL - Abnormal; Notable for the following components:   Sodium 133 (*)    Potassium 5.6 (*)    Chloride 97 (*)    Glucose, Bld 114 (*)    Creatinine, Ser 1.56 (*)    Total Protein 5.5 (*)  Albumin 3.1 (*)    GFR, Estimated 48 (*)    All other components within normal limits  BRAIN NATRIURETIC PEPTIDE - Abnormal; Notable for the following components:   B Natriuretic Peptide 1,171.7 (*)    All other components within normal limits  BASIC METABOLIC PANEL - Abnormal; Notable for the following components:   Sodium 131 (*)    Chloride 96 (*)    Glucose, Bld 101 (*)    Creatinine, Ser 1.51 (*)    Calcium 8.8 (*)    GFR, Estimated 50 (*)    All other components within normal limits  TROPONIN I (HIGH SENSITIVITY) - Abnormal;  Notable for the following components:   Troponin I (High Sensitivity) 41 (*)    All other components within normal limits  TROPONIN I (HIGH SENSITIVITY) - Abnormal; Notable for the following components:   Troponin I (High Sensitivity) 42 (*)    All other components within normal limits  RESP PANEL BY RT-PCR (FLU A&B, COVID) ARPGX2  LIPASE, BLOOD    EKG EKG Interpretation  Date/Time:  Tuesday Apr 26 2021 15:25:54 EDT Ventricular Rate:  66 PR Interval:  197 QRS Duration: 126 QT Interval:  410 QTC Calculation: 430 R Axis:   76 Text Interpretation: Sinus rhythm Nonspecific intraventricular conduction delay Inferior infarct, old No significant change since last tracing Confirmed by Wandra Arthurs [phone]) on 04/26/2021 4:13:09 PM   Radiology DG Chest Portable 1 View  Result Date: 04/26/2021 CLINICAL DATA:  Shortness of breath EXAM: PORTABLE CHEST 1 VIEW COMPARISON:  11/27/2018 FINDINGS: Post sternotomy changes. No focal opacity or pleural effusion. Stable cardiomediastinal silhouette. No pneumothorax. IMPRESSION: No active disease. Electronically Signed   By: Donavan Foil M.D.   On: 04/26/2021 16:23    Procedures Procedures   Medications Ordered in ED Medications  furosemide (LASIX) injection 40 mg (40 mg Intravenous Given 04/26/21 1911)    ED Course  I have reviewed the triage vital signs and the nursing notes.  Pertinent labs & imaging results that were available during my care of the patient were reviewed by me and considered in my medical decision making (see chart for details).    MDM Rules/Calculators/A&P                         [facility] is a 70 y.o. male here with shortness of breath with exertion as well as some leg swelling.  Patient has a EF of 35%.  Also history of CAD.  Consider CHF exacerbation versus unstable angina.  Will ambulate patient and check pulse ox.  We will get troponin x2 and BNP.  9:48 PM BNP 1171. Trop was 41 then 42. COVID negative. O2 is stable  with ambulation.  Patient was on oxygen on arrival but no longer required oxygen.  Patient also diuresed well with IV Lasix.  His initial potassium was hemolyzed and was 5.8 and repeat was 4.8.  At this point, patient is stable for discharge.  We will have him follow-up with the heart failure clinic.  He has Lasix 40 mg at home and I told him to take it daily as prescribed.  He can follow-up with heart failure clinic to get a repeat BMP in the week.   Final Clinical Impression(s) / ED Diagnoses Final diagnoses:  None    Rx / DC Orders ED Discharge Orders    None       Drenda Freeze, MD 04/26/21 2149

## 2021-04-26 NOTE — Discharge Instructions (Signed)
Please take your Lasix daily as prescribed by your doctor.   You need to see your heart doctor in a week.   Also needs your kidney function rechecked in a week  Return to ER if you have worsening shortness of breath or chest pain or leg swelling or trouble breathing or fevers

## 2021-04-26 NOTE — ED Triage Notes (Signed)
Pt arrives to ED BIB [facility] due to [facility]. Per EMS pt has been [facility] since 3am. Per EMS pt was 92% RA and was placed on 4L [place] brining O2 up to 99%. Per EMS pt was on Lasix and taken off of it 3 months ago. Hx of CHF, HTN and Afib and is on Eliquis. Pt A/O x4.  BP 162/94 HR 72 R 20

## 2021-04-26 NOTE — ED Notes (Signed)
All appropriate discharge materials reviewed at length with patient. Time for questions provided. Pt has no other questions at this time and verbalizes understanding of all provided materials.  

## 2021-04-27 ENCOUNTER — Telehealth: Payer: Self-pay | Admitting: Cardiovascular Disease

## 2021-04-27 MED ORDER — ENTRESTO 49-51 MG PO TABS
1.0000 | ORAL_TABLET | Freq: Two times a day (BID) | ORAL | 0 refills | Status: DC
Start: 2021-04-27 — End: 2021-05-25

## 2021-04-27 NOTE — Telephone Encounter (Signed)
Pt's medication was sent to pt's pharmacy as requested. Confirmation received.  °

## 2021-04-27 NOTE — Telephone Encounter (Signed)
*  STAT* If patient is at the pharmacy, call can be transferred to refill team.   1. Which medications need to be refilled? (please list name of each medication and dose if known)  sacubitril-valsartan (ENTRESTO) 49-51 MG    2. Which pharmacy/location (including street and city if local pharmacy) is medication to be sent to? [id number] - [place], [place] - [address] AT [place]  3. Do they need a 30 day or 90 day supply? 90 day   Patient is out of medication.

## 2021-05-03 ENCOUNTER — Other Ambulatory Visit: Payer: Self-pay

## 2021-05-03 MED ORDER — ELIQUIS 5 MG PO TABS: ORAL_TABLET | ORAL | 0 refills | Status: DC

## 2021-05-03 NOTE — Telephone Encounter (Signed)
Eliquis 5mg  refill request received. Patient is 70 years old, weight-111.1kg, Crea-1.51 on 04/26/2021, Diagnosis-Afib, and last seen by Vin PA on 10/28/2019 & has an appt with Cecilie Kicks on 05/13/21. Dose is appropriate based on dosing criteria. Will send in refill to requested pharmacy.   Limited supply as pt is overdue and awaiting appt.

## 2021-05-13 ENCOUNTER — Ambulatory Visit: Payer: PPO | Admitting: Cardiology

## 2021-05-13 ENCOUNTER — Other Ambulatory Visit: Payer: Self-pay

## 2021-05-13 ENCOUNTER — Encounter: Payer: Self-pay | Admitting: Cardiology

## 2021-05-13 VITALS — BP 114/68 | HR 75 | Ht 75.0 in | Wt 250.0 lb

## 2021-05-13 DIAGNOSIS — I10 Essential (primary) hypertension: Secondary | ICD-10-CM | POA: Diagnosis not present

## 2021-05-13 DIAGNOSIS — N1831 Chronic kidney disease, stage 3a: Secondary | ICD-10-CM

## 2021-05-13 DIAGNOSIS — Z951 Presence of aortocoronary bypass graft: Secondary | ICD-10-CM

## 2021-05-13 DIAGNOSIS — I2510 Atherosclerotic heart disease of native coronary artery without angina pectoris: Secondary | ICD-10-CM

## 2021-05-13 DIAGNOSIS — N182 Chronic kidney disease, stage 2 (mild): Secondary | ICD-10-CM | POA: Diagnosis not present

## 2021-05-13 DIAGNOSIS — E1122 Type 2 diabetes mellitus with diabetic chronic kidney disease: Secondary | ICD-10-CM

## 2021-05-13 DIAGNOSIS — I480 Paroxysmal atrial fibrillation: Secondary | ICD-10-CM

## 2021-05-13 DIAGNOSIS — I5042 Chronic combined systolic (congestive) and diastolic (congestive) heart failure: Secondary | ICD-10-CM

## 2021-05-13 DIAGNOSIS — Z720 Tobacco use: Secondary | ICD-10-CM

## 2021-05-13 NOTE — Patient Instructions (Addendum)
Medication Instructions:  Your physician recommends that you continue on your current medications as directed. Please refer to the Current Medication list given to you today.  *If you need a refill on your cardiac medications before your next appointment, please call your pharmacy*   Lab Work: Bmp, Magnesium- today   If you have labs (blood work) drawn today and your tests are completely normal, you will receive your results only by: Marland Kitchen MyChart Message (if you have MyChart) OR . A paper copy in the mail If you have any lab test that is abnormal or we need to change your treatment, we will call you to review the results.   Testing/Procedures: Your physician has requested that you have an echocardiogram. Echocardiography is a painless test that uses sound waves to create images of your heart. It provides your doctor with information about the size and shape of your heart and how well your heart's chambers and valves are working. This procedure takes approximately one hour. There are no restrictions for this procedure.     Follow-Up: Follow up as planned    Other Instructions For your nose drip, you can purchase some over the counter Allegra, Zyrtec or Claritin

## 2021-05-13 NOTE — Progress Notes (Signed)
Cardiology Office Note   Date:  05/13/2021   ID:  Callie, Bunyard [birth year], MRN [id number]  PCP:  Unk Pinto, MD  Cardiologist:  Dr. Acie Fredrickson     Chief Complaint  Patient presents with  . Congestive Heart Failure      History of Present Illness: Curtis Perez is a 70 y.o. male who presents for recent CHF hx of CAD s/p CABG in 2013, HTN, HLD, chronic systolic CHF, CKD stage III, tobacco smoking and PAF seen for follow up.   Admitted 10/2017 with NSTEMI. Cath 10/2017 revealed 99% stenosis of the circumflex and occlusion of the SVG to OM and SVGto RCA. DES was placed in the proximal circumflex, the distal circumflex was 100% stenosed, not amendable to balloon angioplasty.  Hospitalized in 10/2018 with afib.  Plan was for outpatient cardioversion however did not followed up until 09/18/2019. Underwent successful cardioversion on 10/10/2019. On his last visit lopressor stopped and pt on toprol XL 50 mg daily and Entresto increased to 49/51 daily.    On ER visit 04/26/21 patient had acute onset of shortness of breath since night before visit. Marland Kitchen  He states that it is worse when he walks.  He states that his legs are also swollen.  He has baseline CKD and he was told to be off of his Lasix for several months so has not been taking it.  .he was awaken from sleep with SOB.  He has baseline CKD and was told to stop lasix. .   Cr in Dec was 2.05  In ER 1.51, his usual is from 1.17 to 1.5  BNP was 1171, trop 41 then 42, given IV lasix K+4.8 Lasix 40 mg daily prescribed.  Today no chest pain, he has always had brief twinges of chest pain that is the same.  His breathing is better since ER.  We discussed his renal disease and his HF and balance.  He is aware to decrease salt.  He is smoking but down to 1 ppd.  From 2 ppd.  We did discuss to decrease further but he is not interested.    He does note he has been SOB since last procedure.  But mostly with exertion.     Past Medical History:   Diagnosis Date  . Acute inferior myocardial infarction ([place]) 10/24/2017  . Adenomatous colon polyp 02/15/2009  . Bradycardia   . CAD (coronary artery disease)    remote MI in 2004 with PCI; s/p CABG x 3 03/2012  . CHF (congestive heart failure) ([place])   . COPD (chronic obstructive pulmonary disease) ([place])   . Depression   . Family history of malignant neoplasm of gastrointestinal tract   . GERD (gastroesophageal reflux disease)   . Hyperlipidemia   . Hypertension   . Hypothyroidism   . Myocardial infarction ([place])    2004  . Obesity   . Prediabetes   . 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 Tobacco abuse 03/28/2012  . Vitamin D deficiency     Past Surgical History:  Procedure Laterality Date  . BACK SURGERY    . CARDIOVERSION N/A 10/10/2019   Procedure: CARDIOVERSION;  Surgeon: Pixie Casino, MD;  Location: [facility];  Service: Cardiovascular;  Laterality: N/A;  . COLONOSCOPY  02/15/2009  . CORONARY ANGIOPLASTY WITH STENT PLACEMENT     about 8 years ago at [facility]  . CORONARY  ARTERY BYPASS GRAFT  04/02/2012   Procedure: CORONARY ARTERY BYPASS GRAFTING (CABG);  Surgeon: Rexene Alberts, MD;  Location: [facility];  Service: Open Heart Surgery;  Laterality: N/A;  On pump, times three graphs using endoscopically harvested right greater saphenous vein and left internal mammary artery.   [facility] ACUTE MI REVASCULARIZATION N/A 10/23/2017   Procedure: Coronary/Graft Acute MI Revascularization;  Surgeon: Jettie Booze, MD;  Location: [facility];  Service: Cardiovascular;  Laterality: N/A;  . KNEE SURGERY    . LEFT HEART CATH AND CORS/GRAFTS ANGIOGRAPHY N/A 10/23/2017   Procedure: LEFT HEART CATH AND CORS/GRAFTS ANGIOGRAPHY;  Surgeon: Jettie Booze, MD;  Location: [facility];  Service: Cardiovascular;  Laterality: N/A;  . TONSILLECTOMY       Current Outpatient Medications  Medication Sig  Dispense Refill  . apixaban (ELIQUIS) 5 MG TABS tablet TAKE 1 TABLET (5 MG TOTAL) BY MOUTH 2 (TWO) TIMES DAILY. KEEP CARDIOLOGIST APPT FOR REFILLS. 60 tablet 0  . aspirin 81 MG chewable tablet Chew 1 tablet (81 mg total) daily by mouth.    Marland Kitchen buPROPion (WELLBUTRIN XL) 300 MG 24 hr tablet Take      1 tablet      Daily       for Mood, Focus & Concentration. 90 tablet 3  . ezetimibe (ZETIA) 10 MG tablet TAKE 1 TABLET BY MOUTH EVERY DAY FOR CHOLESTEROL 90 tablet 1  . ferrous sulfate 325 (65 FE) MG tablet Take 325 mg by mouth every Monday, Wednesday, and Friday.    Marland Kitchen FLUoxetine (PROZAC) 40 MG capsule Take     1 capsule      Daily      for Mood 90 capsule 3  . furosemide (LASIX) 40 MG tablet Take 1 tablet daily for BP & Fluid (Dx: i10) (Patient taking differently: Take 40 mg by mouth every other day. Take 1 tablet daily for BP & Fluid (Dx: i10) SUPPOSE TO TAKE 1/2 TABLET DAILY PER PCP) 90 tablet 3  . isosorbide mononitrate (IMDUR) 30 MG 24 hr tablet TAKE 1 TABLET DAILY FOR HEART 90 tablet 3  . levothyroxine (SYNTHROID) 100 MCG tablet Take      1 tablet      Daily     on an empty stomach with only water for 30 minutes & no Antacid meds, Calcium or Magnesium for 4 hours & avoid Biotin 90 tablet 3  . magnesium oxide (MAG-OX) 400 MG tablet Take 400 mg by mouth daily.    . metFORMIN (GLUCOPHAGE-XR) 500 MG 24 hr tablet TAKE 2 TABLETS 2 X /DAY WITH MEALS FOR DIABETES 360 tablet 1  . metoprolol succinate (TOPROL-XL) 50 MG 24 hr tablet Take  1 tablet  Daily  for BP & Heart 90 tablet 1  . nitroGLYCERIN (NITROSTAT) 0.4 MG SL tablet Place 1 tablet (0.4 mg total) every 5 (five) minutes as needed under the tongue. 25 tablet 2  . Omega-3 Fatty Acids (FISH OIL) 1000 MG CAPS Take 2,000 mg by mouth daily.    Marland Kitchen omeprazole (PRILOSEC) 40 MG capsule TAKE 1 CAPSULE DAILY FOR ACID INDIGESTION & REFLUX 90 capsule 3  . potassium chloride SA (K-DUR,KLOR-CON) 20 MEQ tablet Take 20 mEq by mouth daily.     . rosuvastatin (CRESTOR) 40  MG tablet TAKE 1 TABLET BY MOUTH EVERY DAY FOR CHOLESTEROL 90 tablet 3  . sacubitril-valsartan (ENTRESTO) 49-51 MG Take 1 tablet by mouth 2 (two) times daily. Please keep upcoming appt in  May 2022 before anymore refills. Thank you Final Attempt 60 tablet 0  . vitamin B-12 (CYANOCOBALAMIN) 1000 MCG tablet Take 1,000 mcg by mouth daily.    . Vitamin D, Ergocalciferol, (DRISDOL) 50000 UNITS CAPS capsule Take 1 capsule (50,000 Units total) by mouth 3 (three) times a week. (Patient taking differently: Take 50,000 Units by mouth once a week.) 30 capsule 3   No current facility-administered medications for this visit.    Allergies:   Lisinopril    Social History:  The patient  reports that he has been smoking cigarettes. He has a 80.00 pack-year smoking history. He has never used smokeless tobacco. He reports that he does not drink alcohol and does not use drugs.   Family History:  The patient's family history includes Alzheimer's disease in his mother; Colon cancer in his father and paternal grandmother; Hypertension in his mother.    ROS:  General:no colds or fevers, no weight changes Skin:no rashes or ulcers HEENT:no blurred vision, no congestion CV:see HPI PUL:see HPI GI:no diarrhea constipation or melena, no indigestion GU:no hematuria, no dysuria MS:no joint pain, no claudication Neuro:no syncope, no lightheadedness Endo:+ diabetes, + thyroid disease   Wt Readings from Last 3 Encounters:  05/13/21 250 lb (113.4 kg)  04/26/21 245 lb (111.1 kg)  12/16/20 255 lb 11 oz (116 kg)     PHYSICAL EXAM: VS:  BP 114/68   Pulse 75   Ht 6\' 3"  (1.905 m)   Wt 250 lb (113.4 kg)   BMI 31.25 kg/m  , BMI Body mass index is 31.25 kg/m. General:Pleasant affect, NAD Skin:Warm and dry, brisk capillary refill HEENT:normocephalic, sclera clear, mucus membranes moist Neck:supple, no JVD, no bruits  Heart:S1S2 RRR without murmur, gallup, rub or click Lungs:with occ wheeze without rales,  rhonchi MGQ:QPYP, non tender, + BS, do not palpate liver spleen or masses Ext:no lower ext edema, 2+ pedal pulses, 2+ radial pulses Neuro:alert and oriented X 3, MAE, follows commands, + facial symmetry    EKG:  EKG is not ordered today. The ekg in ER 04/26/21 SR with old inf Mi and no EKG changes from 12/16/20    Recent Labs: 12/16/2020: Magnesium 1.7; TSH 1.46 04/26/2021: ALT 32; B Natriuretic Peptide 1,171.7; BUN 18; Creatinine, Ser 1.51; Hemoglobin 11.9; Platelets 183; Potassium 4.8; Sodium 131    Lipid Panel    Component Value Date/Time   CHOL 97 12/16/2020 1426   CHOL 92 (L) 02/11/2018 1011   TRIG 138 12/16/2020 1426   HDL 48 12/16/2020 1426   HDL 42 02/11/2018 1011   CHOLHDL 2.0 12/16/2020 1426   VLDL 34 10/24/2017 0247   LDLCALC 27 12/16/2020 1426       Other studies Reviewed: Additional studies/ records that were reviewed today include: .  Echo 10/22/19  IMPRESSIONS    1. Left ventricular ejection fraction, by visual estimation, is 30 to  35%. The left ventricle has moderate to severely decreased function. There  is no left ventricular hypertrophy.  2. Moderately dilated left ventricular internal cavity size.  3. Septal apical and inferior wall hypokinesis   GLS low normal -15.8.  4. Global right ventricle has normal systolic function.The right  ventricular size is normal. No increase in right ventricular wall  thickness.  5. Left atrial size was moderately dilated.  6. Right atrial size was normal.  7. The mitral valve is normal in structure. Mild mitral valve  regurgitation.  8. The tricuspid valve is normal in structure. Tricuspid valve  regurgitation is mild.  9. The aortic valve is tricuspid. Aortic valve regurgitation is not  visualized. Mild aortic valve sclerosis without stenosis.  10. The pulmonic valve was grossly normal. Pulmonic valve regurgitation is  mild.  11. The interatrial septum was not well visualized.   In comparison to  the previous echocardiogram(s): 10/31/18 EF 30-35%. PA  pressure 2mmHg.  FINDINGS  Left Ventricle: Left ventricular ejection fraction, by visual estimation,  is 30 to 35%. The left ventricle has moderate to severely decreased  function. The left ventricular internal cavity size was moderately dilated  left ventricle. There is no left  ventricular hypertrophy. Septal apical and inferior wall hypokinesis  GLS low normal -15.8.   Right Ventricle: The right ventricular size is normal. No increase in  right ventricular wall thickness. Global RV systolic function is has  normal systolic function.   Left Atrium: Left atrial size was moderately dilated.   Right Atrium: Right atrial size was normal in size   Pericardium: There is no evidence of pericardial effusion.   Mitral Valve: The mitral valve is normal in structure. There is mild  thickening of the mitral valve leaflet(s). Mild mitral valve  regurgitation.   Tricuspid Valve: The tricuspid valve is normal in structure. Tricuspid  valve regurgitation is mild.   Aortic Valve: The aortic valve is tricuspid. Aortic valve regurgitation is  not visualized. Mild aortic valve sclerosis is present, with no evidence  of aortic valve stenosis.   Pulmonic Valve: The pulmonic valve was grossly normal. Pulmonic valve  regurgitation is mild.   Aorta: The aortic root is normal in size and structure.   IAS/Shunts: The interatrial septum was not well visualized.   Cardiac cath 10/24/17   Prox Cx lesion is 99% stenosed. SVG to OM is occluded. Thrombectomy, followed by a drug-eluting stent which was successfully placed using a STENT SYNERGY DES 3.5X20, postdilated to 3.75.  Post intervention, there is a 0% residual stenosis.  Ost 1st Mrg lesion is 70% stenosed. Balloon angioplasty was performed in the ostial vessel before and after circumflex stent placement, using a BALLOON SAPPHIRE 2.5X12.  Post intervention, there is a 20% residual  stenosis.  Dist Cx lesion is 100% stenosed. Balloon angioplasty ws attempted but the lesion was too distal in the vessel.  Prox LAD lesion is 90% stenosed. LIMA to LAD is patent.  Prox RCA lesion is 100% stenosed. SVG to RCA is occluded.  LV end diastolic pressure is mildly elevated.  There is no aortic valve stenosis.   Continue angiomax for another hour.  He will need dual antiplatelet therapy for at least a year, along with aggressive secondary prevention.   Diagnostic Dominance: Left  Intervention      ASSESSMENT AND PLAN:  1.  Chronic systolic HF with recent exacerbation off lasix.  Now on lasix 40 mg daily.  Will check BMP today and plan for echo - as well , last one 2020, will need to eval EF, then decide lasix vs entresto.  - if cr elevates hold lasix for 1 weeks then resume at lower dose.   May need nephrology referral. Will follow up with Dr. Acie Fredrickson in 6-8 weeks.  2.  CAD s/p CABG and with graft dysfunction, last stent 2019,.  no increase of angina, troponin in the 40s with ER visit.  Ischemia could be a cause of new SOB as well.  Will eval if EF lower.  3.  Tobacco use down to 1ppd, does not think he will stop. We discussed.  4.  PAF in SR on Eliquis. No bleeding.  5.  HTN stable today , continue current meds.  6. CKD 3 a  Will check today on lasix.   7.  DM-2 per IM stable  8.  HLD per PCP on statin and zetia  9. Nasal drainage, we discussed, Claritin, zyrtec ,or allegra if no improvement to discuss with PCP.    Current medicines are reviewed with the patient today.  The patient Has no concerns regarding medicines.  The following changes have been made:  See above Labs/ tests ordered today include:see above  Disposition:   FU:  see above  Signed, Cecilie Kicks, NP  05/13/2021 4:03 PM    [facility] [place], [place], [place] Phone: [phone]; Fax: [phone]

## 2021-05-14 LAB — BASIC METABOLIC PANEL
BUN/Creatinine Ratio: 9 — ABNORMAL LOW (ref 10–24)
BUN: 18 mg/dL (ref 8–27)
CO2: 24 mmol/L (ref 20–29)
Calcium: 9.4 mg/dL (ref 8.6–10.2)
Chloride: 96 mmol/L (ref 96–106)
Creatinine, Ser: 1.92 mg/dL — ABNORMAL HIGH (ref 0.76–1.27)
Glucose: 136 mg/dL — ABNORMAL HIGH (ref 65–99)
Potassium: 5 mmol/L (ref 3.5–5.2)
Sodium: 134 mmol/L (ref 134–144)
eGFR: 38 mL/min/{1.73_m2} — ABNORMAL LOW (ref >59)

## 2021-05-14 LAB — MAGNESIUM: Magnesium: 2.1 mg/dL (ref 1.6–2.3)

## 2021-05-17 ENCOUNTER — Telehealth: Payer: Self-pay | Admitting: *Deleted

## 2021-05-17 DIAGNOSIS — N1831 Chronic kidney disease, stage 3a: Secondary | ICD-10-CM

## 2021-05-17 DIAGNOSIS — E1122 Type 2 diabetes mellitus with diabetic chronic kidney disease: Secondary | ICD-10-CM

## 2021-05-17 DIAGNOSIS — N182 Chronic kidney disease, stage 2 (mild): Secondary | ICD-10-CM

## 2021-05-17 NOTE — Telephone Encounter (Signed)
Tried to call back but left message to return call. 5/31 @ 5:14

## 2021-05-17 NOTE — Telephone Encounter (Signed)
-----   Message from Isaiah Serge, NP sent at 05/17/2021  7:04 AM EDT ----- Stop potassium, the level is higher.  Decrease lasix to 20 mg daily. Kidney function slightly stressed.  Pt should see nephrology for eval.  Please arrange.

## 2021-05-17 NOTE — Telephone Encounter (Signed)
Left message to call back for results of lab work and medication change.

## 2021-05-25 ENCOUNTER — Other Ambulatory Visit: Payer: Self-pay | Admitting: Cardiovascular Disease

## 2021-05-25 MED ORDER — ENTRESTO 49-51 MG PO TABS: 1.0000 | ORAL_TABLET | Freq: Two times a day (BID) | ORAL | 3 refills | Status: AC

## 2021-05-27 ENCOUNTER — Encounter: Payer: Self-pay | Admitting: Cardiology

## 2021-05-27 ENCOUNTER — Other Ambulatory Visit: Payer: Self-pay | Admitting: *Deleted

## 2021-05-27 DIAGNOSIS — I509 Heart failure, unspecified: Secondary | ICD-10-CM

## 2021-05-27 DIAGNOSIS — N1831 Chronic kidney disease, stage 3a: Secondary | ICD-10-CM

## 2021-05-27 DIAGNOSIS — I10 Essential (primary) hypertension: Secondary | ICD-10-CM

## 2021-05-27 DIAGNOSIS — E1122 Type 2 diabetes mellitus with diabetic chronic kidney disease: Secondary | ICD-10-CM

## 2021-05-27 MED ORDER — FUROSEMIDE 20 MG PO TABS: ORAL_TABLET | ORAL | 3 refills | Status: DC

## 2021-05-27 NOTE — Telephone Encounter (Signed)
Error

## 2021-05-28 ENCOUNTER — Other Ambulatory Visit: Payer: Self-pay | Admitting: Cardiovascular Disease

## 2021-05-30 NOTE — Telephone Encounter (Signed)
Pt's age 70, wt 113.4 kg, SCr 1.92, CrCl 59.88, last ov w/ LI 05/13/21.

## 2021-05-31 NOTE — Addendum Note (Signed)
Addended by: Harland German A on: 05/31/2021 09:42 AM   Modules accepted: Orders

## 2021-05-31 NOTE — Telephone Encounter (Signed)
Corrected order for [facility] placed.

## 2021-06-02 ENCOUNTER — Ambulatory Visit (INDEPENDENT_AMBULATORY_CARE_PROVIDER_SITE_OTHER): Payer: PPO | Admitting: Adult Health

## 2021-06-02 ENCOUNTER — Encounter: Payer: Self-pay | Admitting: Adult Health

## 2021-06-02 ENCOUNTER — Other Ambulatory Visit: Payer: Self-pay

## 2021-06-02 VITALS — BP 128/78 | HR 53 | Temp 96.9°F | Ht 75.0 in | Wt 256.0 lb

## 2021-06-02 DIAGNOSIS — J439 Emphysema, unspecified: Secondary | ICD-10-CM | POA: Diagnosis not present

## 2021-06-02 DIAGNOSIS — I252 Old myocardial infarction: Secondary | ICD-10-CM

## 2021-06-02 DIAGNOSIS — I4891 Unspecified atrial fibrillation: Secondary | ICD-10-CM | POA: Diagnosis not present

## 2021-06-02 DIAGNOSIS — Z0001 Encounter for general adult medical examination with abnormal findings: Secondary | ICD-10-CM

## 2021-06-02 DIAGNOSIS — E039 Hypothyroidism, unspecified: Secondary | ICD-10-CM

## 2021-06-02 DIAGNOSIS — E669 Obesity, unspecified: Secondary | ICD-10-CM | POA: Diagnosis not present

## 2021-06-02 DIAGNOSIS — R0609 Other forms of dyspnea: Secondary | ICD-10-CM

## 2021-06-02 DIAGNOSIS — Z951 Presence of aortocoronary bypass graft: Secondary | ICD-10-CM

## 2021-06-02 DIAGNOSIS — R6889 Other general symptoms and signs: Secondary | ICD-10-CM | POA: Diagnosis not present

## 2021-06-02 DIAGNOSIS — R0600 Dyspnea, unspecified: Secondary | ICD-10-CM

## 2021-06-02 DIAGNOSIS — Z79899 Other long term (current) drug therapy: Secondary | ICD-10-CM | POA: Diagnosis not present

## 2021-06-02 DIAGNOSIS — I5022 Chronic systolic (congestive) heart failure: Secondary | ICD-10-CM | POA: Diagnosis not present

## 2021-06-02 DIAGNOSIS — E1122 Type 2 diabetes mellitus with diabetic chronic kidney disease: Secondary | ICD-10-CM | POA: Diagnosis not present

## 2021-06-02 DIAGNOSIS — E559 Vitamin D deficiency, unspecified: Secondary | ICD-10-CM

## 2021-06-02 DIAGNOSIS — I10 Essential (primary) hypertension: Secondary | ICD-10-CM | POA: Diagnosis not present

## 2021-06-02 DIAGNOSIS — I25118 Atherosclerotic heart disease of native coronary artery with other forms of angina pectoris: Secondary | ICD-10-CM

## 2021-06-02 DIAGNOSIS — Z86010 Personal history of colonic polyps: Secondary | ICD-10-CM

## 2021-06-02 DIAGNOSIS — N182 Chronic kidney disease, stage 2 (mild): Secondary | ICD-10-CM | POA: Diagnosis not present

## 2021-06-02 DIAGNOSIS — E1169 Type 2 diabetes mellitus with other specified complication: Secondary | ICD-10-CM | POA: Diagnosis not present

## 2021-06-02 DIAGNOSIS — Z0000 Encounter for general adult medical examination without abnormal findings: Secondary | ICD-10-CM

## 2021-06-02 DIAGNOSIS — Z720 Tobacco use: Secondary | ICD-10-CM

## 2021-06-02 DIAGNOSIS — E785 Hyperlipidemia, unspecified: Secondary | ICD-10-CM

## 2021-06-02 DIAGNOSIS — F332 Major depressive disorder, recurrent severe without psychotic features: Secondary | ICD-10-CM | POA: Diagnosis not present

## 2021-06-02 NOTE — Progress Notes (Signed)
MEDICARE ANNUAL WELLNESS VISIT AND FOLLOW UP Assessment:   Curtis Perez was seen today for follow-up and medicare wellness.  Diagnoses and all orders for this visit:  Encounter for Medicare annual wellness exam Due annually   Coronary artery disease of native artery of native heart with stable angina pectoris ([place]) Continue imdur; cardiology follows Control blood pressure, cholesterol, glucose, increase exercise.   Atrial fibrillation, unspecified type ([place]) Rate controlled, continue eliquis Cardiology follows  Chronic systolic CHF (congestive heart failure) ([place]) Weights are trending up but appears euvolemic Cardiology following Emphasized salt restriction, less than 2000mg  a day. Encouraged daily monitoring of the patient's weight, call office if 5 lb weight loss or gain in a day.  Encouraged regular exercise. If any increasing shortness of breath, swelling, or chest pressure go to ER immediately.  decrease your fluid intake to less than 2 L daily please remember to always increase your potassium intake with any increase of your fluid pill.   Pulmonary emphysema, unspecified emphysema type ([place]) Per imaging; STOP SMOKING He declined referral to pulm for PFTs  Essential hypertension -     CBC with Differential/Platelet -     COMPLETE METABOLIC PANEL WITH GFR -     Magnesium  Hyperlipidemia associated with type 2 diabetes mellitus ([place]) Continue statin for LDL goal <70 Continue low cholesterol diet and exercise.  Check lipid panel.  -     Lipid panel -     TSH  Type 2 diabetes mellitus with stage 2 chronic kidney disease, without long-term current use of insulin [facility]) Education: Reviewed 'ABCs' of diabetes management (respective goals in parentheses):  A1C (<7), blood pressure (<130/80), and cholesterol (LDL <70) Eye Exam yearly and Dental Exam every 6 months. Dietary recommendations Physical Activity recommendations -     COMPLETE METABOLIC PANEL WITH GFR -     Hemoglobin  A1c  Recurrent endogenous depression (HCC) Continue medications Lifestyle discussed: diet/exerise, sleep hygiene, stress management, hydration  Hypothyroidism, unspecified type continue medications the same pending lab results reminded to take on an empty stomach 30-19mins before food.  -     TSH  History of MI (myocardial infarction) Cardiology follows  Medication management -     CBC with Differential/Platelet -     COMPLETE METABOLIC PANEL WITH GFR -     Magnesium  Obesity (BMI 30.0-34.9) Long discussion about weight loss, diet, and exercise Recommended diet heavy in fruits and veggies and low in animal Discussed appropriate weight for height  Patient ambivalent Follow up at next visit  S/P CABG x 3 Cardiology follow  Tobacco abuse Smoking cessation-  instruction/counseling given, counseled patient on the dangers of tobacco use, advised patient to stop smoking, and reviewed strategies to maximize success, patient not ready to quit at this time.   Vitamin D deficiency At goal at recent check; continue to recommend supplementation for goal of 60-100 Defer vitamin D level  History of colonic polyps Due 06/2021 with Dr. Havery Moros -     Ambulatory referral to Gastroenterology  Exertional dyspnea Cardiac vs decompensation vs cannot r/o COPD etiology  He follows closely with cardiology Advised pulm referral for PFTs and possible benefit with inhaler but declines adamantly  Advsied to reduce/quit smoking, encouraged gradual onset of gentle walking/exercise. Go to the ER if any chest pain, progressive or persistent shortness of breath, nausea, dizziness, severe HA, changes vision/speech   Over 30 minutes of exam, counseling, chart review, and critical decision making was performed  Future Appointments  Date Time Provider  [place]  06/10/2021  4:05 PM [facility] ECHO 5 [id number]  07/11/2021  3:40 PM Nahser, Wonda Cheng, MD [facility]   09/05/2021  4:00 PM Unk Pinto, MD [facility] None  01/04/2022 10:00 AM Unk Pinto, MD [facility] None  06/02/2022 10:00 AM Liane Comber, NP [facility] None     Plan:   During the course of the visit the patient was educated and counseled about appropriate screening and preventive services including:   Pneumococcal vaccine  Influenza vaccine Prevnar 13 Td vaccine Screening electrocardiogram Colorectal cancer screening Diabetes screening Glaucoma screening Nutrition counseling    Subjective:  Curtis Perez is a 70 y.o. male who presents for Medicare Annual Wellness Visit and 3 month follow up for HTN, hyperlipidemia, CHF, T2DM, hypothyroid, and vitamin D Def.   He has been on prozac 40 mg and wellbutrin 300 mg for depression.   BMI is Body mass index is 32 kg/m., he has not been working on diet and exercise. Wt Readings from Last 3 Encounters:  06/02/21 256 lb (116.1 kg)  05/13/21 250 lb (113.4 kg)  04/26/21 245 lb (111.1 kg)   In 2005 he was admitted with ACS and underwent PCA /DES. Then in 2013, he underwent CABG. In 2018, he had a 2sd NSTEMI with DES implanted and also had CHF during that admission.  In 2019 , he presented with pAfib and Brilinta wa changed to Eliquis. In 2019, he was hospitalized with CHF (EF 30-35%) In Oct 2020, he has CV by Dr Cathie Olden. Patient is on Entresto for Dx of chronic sys CHF.  He has long hx of smoking 2 ppd, reports has reduced to 3-4 cigs/day.   He has chronic exertional dyspnea, unchanged. Long discussion about etiology, hasn't seen pulm, would recommend PFTs but declines. 80+ pack year history, had recent normal CXR 04/26/2021, never low dose screening CT scan, discussed at length but declines. "Don't want to extend my life."   His blood pressure has been controlled at home, today their BP is BP: 128/78 He does not workout. He denies chest pain, dizziness, edema, PND.   He is on cholesterol medication (zeita and rosuvastatin 40 mg  daily) and denies myalgias. His cholesterol is at goal. The cholesterol last visit was:   Lab Results  Component Value Date   CHOL 97 12/16/2020   HDL 48 12/16/2020   LDLCALC 27 12/16/2020   TRIG 138 12/16/2020   CHOLHDL 2.0 12/16/2020   He has not been working on diet and exercise for T2DM with CKD III and denies hypoglycemia , increased appetite, nausea, paresthesia of the feet, polydipsia, polyuria, and visual disturbances. Last A1C in the office was:  Lab Results  Component Value Date   HGBA1C 6.4 (H) 12/16/2020   Last GFR:  Lab Results  Component Value Date   GFRNONAA 50 (L) 04/26/2021   GFRNONAA 48 (L) 04/26/2021   GFRNONAA 33 (L) 12/16/2020   He is on thyroid medication. His medication was not changed last visit.   Lab Results  Component Value Date   TSH 1.46 12/16/2020  . Patient is on Vitamin D supplement.   Lab Results  Component Value Date   VD25OH 91 12/16/2020       Medication Review:  Current Outpatient Medications (Endocrine & Metabolic):    levothyroxine (SYNTHROID) 100 MCG tablet, Take      1 tablet      Daily     on an empty stomach with only water for 30 minutes &  no Antacid meds, Calcium or Magnesium for 4 hours & avoid Biotin   metFORMIN (GLUCOPHAGE-XR) 500 MG 24 hr tablet, TAKE 2 TABLETS 2 X /DAY WITH MEALS FOR DIABETES  Current Outpatient Medications (Cardiovascular):    ezetimibe (ZETIA) 10 MG tablet, TAKE 1 TABLET BY MOUTH EVERY DAY FOR CHOLESTEROL   furosemide (LASIX) 20 MG tablet, 1 tab daily   isosorbide mononitrate (IMDUR) 30 MG 24 hr tablet, TAKE 1 TABLET DAILY FOR HEART   metoprolol succinate (TOPROL-XL) 50 MG 24 hr tablet, Take  1 tablet  Daily  for BP & Heart   nitroGLYCERIN (NITROSTAT) 0.4 MG SL tablet, Place 1 tablet (0.4 mg total) every 5 (five) minutes as needed under the tongue.   rosuvastatin (CRESTOR) 40 MG tablet, TAKE 1 TABLET BY MOUTH EVERY DAY FOR CHOLESTEROL   sacubitril-valsartan (ENTRESTO) 49-51 MG, Take 1 tablet by mouth 2  (two) times daily.   Current Outpatient Medications (Analgesics):    aspirin 81 MG chewable tablet, Chew 1 tablet (81 mg total) daily by mouth.  Current Outpatient Medications (Hematological):    apixaban (ELIQUIS) 5 MG TABS tablet, TAKE 1 TABLET (5 MG TOTAL) BY MOUTH 2 (TWO) TIMES DAILY. KEEP CARDIOLOGIST APPT FOR REFILLS.   ferrous sulfate 325 (65 FE) MG tablet, Take 325 mg by mouth every Monday, Wednesday, and Friday.   vitamin B-12 (CYANOCOBALAMIN) 1000 MCG tablet, Take 1,000 mcg by mouth daily.  Current Outpatient Medications (Other):    buPROPion (WELLBUTRIN XL) 300 MG 24 hr tablet, Take      1 tablet      Daily       for Mood, Focus & Concentration.   FLUoxetine (PROZAC) 40 MG capsule, Take     1 capsule      Daily      for Mood   magnesium oxide (MAG-OX) 400 MG tablet, Take 400 mg by mouth daily.   Omega-3 Fatty Acids (FISH OIL) 1000 MG CAPS, Take 2,000 mg by mouth daily.   omeprazole (PRILOSEC) 40 MG capsule, TAKE 1 CAPSULE DAILY FOR ACID INDIGESTION & REFLUX   Vitamin D, Ergocalciferol, (DRISDOL) 50000 UNITS CAPS capsule, Take 1 capsule (50,000 Units total) by mouth 3 (three) times a week. (Patient taking differently: Take 50,000 Units by mouth once a week.)   potassium chloride SA (K-DUR,KLOR-CON) 20 MEQ tablet, Take 20 mEq by mouth daily.  (Patient not taking: Reported on 06/02/2021)  Allergies: Allergies  Allergen Reactions   Lisinopril Cough    Current Problems (verified) has COLONIC POLYPS, ADENOMATOUS, HX OF; Essential hypertension; Tobacco abuse; S/P CABG x 3; Type 2 diabetes mellitus with stage 2 chronic kidney disease, without long-term current use of insulin ([place]); GERD (gastroesophageal reflux disease); Vitamin D deficiency; Obesity (BMI 30.0-34.9); Medication management; Rectus sheath hematoma; History of MI (myocardial infarction); Coronary artery disease with stable angina pectoris ([place]); Hypothyroidism; Chronic systolic CHF (congestive heart failure) ([place]); Atrial  fibrillation ([place]); Dyspnea on exertion; Chronic obstructive pulmonary disease ([place]); Elevated transaminase level; Hyperlipidemia associated with type 2 diabetes mellitus ([place]); and Recurrent endogenous depression ([place]) on their problem list.  Screening Tests Immunization History  Administered Date(s) Administered   PFIZER(Purple Top)SARS-COV-2 Vaccination 04/19/2020, 05/10/2020   PPD Test 06/15/2014, 07/10/2016   Pneumococcal Conjugate-13 11/17/2019, 12/16/2020   Pneumococcal Polysaccharide-23 01/05/2009, 08/10/2014   Td 01/05/2009, 11/17/2019   Zoster, Live 01/03/2016    Preventative care: Last colonoscopy: 2018, polyps, 3 year recall Dr. Anson Fret   Prior vaccinations: TD or Tdap: 2020  Influenza: Declines  Pneumococcal: 2015 Prevnar13:  2021 Shingles/Zostavax: will get shingrix at pharmacy  Covid 19: 2/2, pfizer - encouraged   Names of Other Physician/Practitioners you currently use: 1.  [facility] here for primary care 2. [facility], eye doctor, last visit  08/2020, mild cataract 3. [facility], dentist, last visit remote, declines   Patient Care Team: Unk Pinto, MD as PCP - General (Internal Medicine)  Surgical: He  has a past surgical history that includes Tonsillectomy; Knee surgery; Back surgery; Coronary angioplasty with stent; Colonoscopy (02/15/2009); Coronary artery bypass graft (04/02/2012); Coronary/Graft Acute MI Revascularization (N/A, 10/23/2017); LEFT HEART CATH AND CORS/GRAFTS ANGIOGRAPHY (N/A, 10/23/2017); and Cardioversion (N/A, 10/10/2019). Family His family history includes Alzheimer's disease in his mother; Colon cancer in his father and paternal grandmother; Hypertension in his mother. Social history  He reports that he has been smoking cigarettes. He has a 80.00 pack-year smoking history. He has never used smokeless tobacco. He reports that he does not drink alcohol and does not use drugs.  MEDICARE WELLNESS  OBJECTIVES: Physical activity: Current Exercise Habits: The patient does not participate in regular exercise at present, Exercise limited by: cardiac condition(s) Cardiac risk factors: Cardiac Risk Factors include: advanced age (>50men, >105 women);dyslipidemia;hypertension;obesity (BMI >30kg/m2);male gender;sedentary lifestyle;smoking/ tobacco exposure Depression/mood screen:   Depression screen [facility] 2/9 06/02/2021  Decreased Interest 0  Down, Depressed, Hopeless 1  PHQ - 2 Score 1  Altered sleeping 0  Tired, decreased energy 3  Change in appetite 0  Feeling bad or failure about yourself  0  Trouble concentrating 0  Moving slowly or fidgety/restless 0  Suicidal thoughts 0  PHQ-9 Score 4  Difficult doing work/chores Somewhat difficult  Some recent data might be hidden    ADLs:  In your present state of health, do you have any difficulty performing the following activities: 06/02/2021 12/15/2020  Hearing? N N  Vision? N N  Difficulty concentrating or making decisions? N N  Walking or climbing stairs? N N  Dressing or bathing? N N  Doing errands, shopping? N N  Some recent data might be hidden     Cognitive Testing  Alert? Yes  Normal Appearance?Yes  Oriented to person? Yes  Place? Yes   Time? Yes  Recall of three objects?  Yes  Can perform simple calculations? Yes  Displays appropriate judgment?Yes  Can read the correct time from a watch face?Yes  EOL planning: Does Patient Have a Medical Advance Directive?: Yes Type of Advance Directive: Healthcare Power of Attorney, Living will Does patient want to make changes to medical advance directive?: No - Patient declined Copy of [place] in Chart?: No - copy requested   Objective:   Today's Vitals   06/02/21 0951  BP: 128/78  Pulse: (!) 53  Temp: (!) 96.9 F (36.1 C)  SpO2: 98%  Weight: 256 lb (116.1 kg)  Height: 6\' 3"  (1.905 m)   Body mass index is 32 kg/m.  General appearance: alert, no distress,  WD/WN, male HEENT: normocephalic, sclerae anicteric, TMs pearly, nares patent, no discharge or erythema, pharynx normal Oral cavity: MMM, no lesions Neck: supple, no lymphadenopathy, no thyromegaly, no masses Heart: RRR, normal S1, S2, no murmurs Lungs: present all lobes, coarse throughout, scatter rhonchi to bases without wheezing.  Abdomen: +bs, soft, non tender, non distended, no masses, no hepatomegaly, no splenomegaly Musculoskeletal: nontender, no swelling, no obvious deformity Extremities: no edema, no cyanosis, no clubbing Pulses: 2+ symmetric, upper and lower extremities, normal cap refill Neurological: alert, oriented x 3, CN2-12  intact, strength normal upper extremities and lower extremities, sensation normal throughout, DTRs 2+ throughout, no cerebellar signs, gait normal Psychiatric: normal affect, behavior normal, pleasant   Medicare Attestation I have personally reviewed: The patient's medical and social history Their use of alcohol, tobacco or illicit drugs Their current medications and supplements The patient's functional ability including ADLs,fall risks, home safety risks, cognitive, and hearing and visual impairment Diet and physical activities Evidence for depression or mood disorders  The patient's weight, height, BMI, and visual acuity have been recorded in the chart.  I have made referrals, counseling, and provided education to the patient based on review of the above and I have provided the patient with a written personalized care plan for preventive services.     Izora Ribas, NP   06/02/2021

## 2021-06-02 NOTE — Patient Instructions (Signed)
    Shingrix - check with insurance - get at a pharmacy - [facility]

## 2021-06-03 LAB — CBC WITH DIFFERENTIAL/PLATELET
Absolute Monocytes: 459 cells/uL (ref 200–950)
Basophils Absolute: 50 cells/uL (ref 0–200)
Basophils Relative: 0.8 %
Eosinophils Absolute: 161 cells/uL (ref 15–500)
Eosinophils Relative: 2.6 %
HCT: 45 % (ref 38.5–50.0)
Hemoglobin: 14.8 g/dL (ref 13.2–17.1)
Lymphs Abs: 1042 cells/uL (ref 850–3900)
MCH: 29 pg (ref 27.0–33.0)
MCHC: 32.9 g/dL (ref 32.0–36.0)
MCV: 88.2 fL (ref 80.0–100.0)
MPV: 12.2 fL (ref 7.5–12.5)
Monocytes Relative: 7.4 %
Neutro Abs: 4489 cells/uL (ref 1500–7800)
Neutrophils Relative %: 72.4 %
Platelets: 216 10*3/uL (ref 140–400)
RBC: 5.1 10*6/uL (ref 4.20–5.80)
RDW: 14.4 % (ref 11.0–15.0)
Total Lymphocyte: 16.8 %
WBC: 6.2 10*3/uL (ref 3.8–10.8)

## 2021-06-03 LAB — COMPLETE METABOLIC PANEL WITH GFR
AG Ratio: 1.8 (calc) (ref 1.0–2.5)
ALT: 8 U/L — ABNORMAL LOW (ref 9–46)
AST: 10 U/L (ref 10–35)
Albumin: 4.5 g/dL (ref 3.6–5.1)
Alkaline phosphatase (APISO): 93 U/L (ref 35–144)
BUN/Creatinine Ratio: 13 (calc) (ref 6–22)
BUN: 20 mg/dL (ref 7–25)
CO2: 29 mmol/L (ref 20–32)
Calcium: 9.9 mg/dL (ref 8.6–10.3)
Chloride: 96 mmol/L — ABNORMAL LOW (ref 98–110)
Creat: 1.51 mg/dL — ABNORMAL HIGH (ref 0.70–1.25)
GFR, Est African American: 55 mL/min/{1.73_m2} — ABNORMAL LOW (ref >=60)
GFR, Est Non African American: 47 mL/min/{1.73_m2} — ABNORMAL LOW (ref >=60)
Globulin: 2.5 g/dL (calc) (ref 1.9–3.7)
Glucose, Bld: 100 mg/dL — ABNORMAL HIGH (ref 65–99)
Potassium: 4.7 mmol/L (ref 3.5–5.3)
Sodium: 133 mmol/L — ABNORMAL LOW (ref 135–146)
Total Bilirubin: 0.8 mg/dL (ref 0.2–1.2)
Total Protein: 7 g/dL (ref 6.1–8.1)

## 2021-06-03 LAB — LIPID PANEL
Cholesterol: 145 mg/dL (ref <200)
HDL: 62 mg/dL (ref >=40)
LDL Cholesterol (Calc): 56 mg/dL (calc)
Non-HDL Cholesterol (Calc): 83 mg/dL (calc) (ref <130)
Total CHOL/HDL Ratio: 2.3 (calc) (ref <5.0)
Triglycerides: 203 mg/dL — ABNORMAL HIGH (ref <150)

## 2021-06-03 LAB — HEMOGLOBIN A1C
Hgb A1c MFr Bld: 6.2 % of total Hgb — ABNORMAL HIGH (ref <5.7)
Mean Plasma Glucose: 131 mg/dL
eAG (mmol/L): 7.3 mmol/L

## 2021-06-03 LAB — MAGNESIUM: Magnesium: 2 mg/dL (ref 1.5–2.5)

## 2021-06-03 LAB — TSH: TSH: 1.61 mIU/L (ref 0.40–4.50)

## 2021-06-10 ENCOUNTER — Other Ambulatory Visit: Payer: Self-pay

## 2021-06-10 ENCOUNTER — Ambulatory Visit (HOSPITAL_COMMUNITY): Payer: PPO | Attending: Cardiology

## 2021-06-10 DIAGNOSIS — I5042 Chronic combined systolic (congestive) and diastolic (congestive) heart failure: Secondary | ICD-10-CM | POA: Insufficient documentation

## 2021-06-10 LAB — ECHOCARDIOGRAM COMPLETE
Area-P 1/2: 3.48 cm2
MV M vel: 4.7 m/s
MV Peak grad: 88.4 mmHg
S' Lateral: 4.4 cm

## 2021-06-17 ENCOUNTER — Encounter: Payer: Self-pay | Admitting: *Deleted

## 2021-06-24 ENCOUNTER — Other Ambulatory Visit: Payer: Self-pay | Admitting: Internal Medicine

## 2021-06-24 ENCOUNTER — Other Ambulatory Visit: Payer: Self-pay | Admitting: *Deleted

## 2021-06-24 DIAGNOSIS — K219 Gastro-esophageal reflux disease without esophagitis: Secondary | ICD-10-CM

## 2021-06-27 ENCOUNTER — Ambulatory Visit: Payer: PPO | Admitting: Internal Medicine

## 2021-07-10 ENCOUNTER — Encounter: Payer: Self-pay | Admitting: Cardiovascular Disease

## 2021-07-10 NOTE — Progress Notes (Signed)
Cardiology Office Note:    Date:  07/11/2021   ID:  Curtis Perez, DOB [birth year], MRN [id number]  PCP:  Unk Pinto, MD  Cardiologist:  None   Referring MD: Unk Pinto, MD   Problem list 1.  Coronary artery disease-status post coronary artery bypass grafting in 2013 2.  Cigarette smoking 3.  Hyperlipidemia  4.  Hypertension  Chief Complaint  Patient presents with   Congestive Heart Failure   Hyperlipidemia    Feb. 25, 2019   MASSIE MEES is a 70 y.o. male with a hx of coronary artery disease.  I met him 6 years ago   He recently had a non-ST segment elevation myocardial infarction in November. Cardiac catheter revealed a 99% stenosis of the circumflex artery and an occlusion of the saphenous vein graft to the OM and saphenous vein graft to the RCA.  Drug-eluting stent was placed in the proximal circumflex artery the distal circumflex was occluded and was not amenable to angioplasty.  His left ventricular systolic function is mildly reduced with an EF of 40-45%.  He continues to smoke.  He does not seem to have any interest in smoking cessation or dietary changes.     He developed CHF following his  NSTEMI .      Oct. 1, 2020 :  Curtis Perez is seen today for follow-up of his coronary artery disease.  He has a history of coronary artery bypass grafting.  He has a drug-eluting stent in the proximal circumflex artery.   He is in atrial fibrillation today.  Hospitalized in Nov. 2019.  He was started on Eliquis 5 mg twice a day.  The plan during the hospitalization was to schedule him for a TEE cardioversion or alternatively 4 weeks of anticoagulation followed by cardioversion. He has not had his cardioversion  Still smoking  No angina   Echocardiogram reveals an ejection fraction of 30 to 35%.  The fall and ejection fraction may be related to his atrial fibrillation with rapid ventricular response.  He was diuresed in the hospital several liters.  He was started on  Entresto..   July 11, 2021: Curtis Perez is seen today for follow up of his CAD, CABG in 2013  Stenting in 2018 Has atrial fib.   Was cardioverted Oct. 23, 2020  Still smoking  - not as much   For the last week or so, he has had this right sided chest tingling  sensation  Pin prick sensation , Last for 3-4 seconds   Does not get exercise  Echo still shows EF 30-35%. Lipids from June 02 2021 looked good LDL is 56,  HDL is 62 Trigs = 203 .   Past Medical History:  Diagnosis Date   Acute inferior myocardial infarction ([place]) 10/24/2017   Adenomatous colon polyp 02/15/2009   Bradycardia    CAD (coronary artery disease)    remote MI in 2004 with PCI; s/p CABG x 3 03/2012   CHF (congestive heart failure) (HCC)    COPD (chronic obstructive pulmonary disease) ([place])    Depression    Family history of malignant neoplasm of gastrointestinal tract    GERD (gastroesophageal reflux disease)    Hyperlipidemia    Hypertension    Hypothyroidism    Myocardial infarction ([place])    2004   Obesity    Prediabetes    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   LIMA to LAD, SVG to OM1, SVG to LPDA, EVH via right thigh and leg   Tobacco  abuse 03/28/2012   Vitamin D deficiency     Past Surgical History:  Procedure Laterality Date   BACK SURGERY     CARDIOVERSION N/A 10/10/2019   Procedure: CARDIOVERSION;  Surgeon: Pixie Casino, MD;  Location: [facility] ENDOSCOPY;  Service: Cardiovascular;  Laterality: N/A;   COLONOSCOPY  02/15/2009   CORONARY ANGIOPLASTY WITH STENT PLACEMENT     about 8 years ago at [facility]   CORONARY ARTERY BYPASS GRAFT  04/02/2012   Procedure: CORONARY ARTERY BYPASS GRAFTING (CABG);  Surgeon: Rexene Alberts, MD;  Location: [place];  Service: Open Heart Surgery;  Laterality: N/A;  On pump, times three graphs using endoscopically harvested right greater saphenous vein and left internal mammary artery.    CORONARY/GRAFT ACUTE MI REVASCULARIZATION N/A 10/23/2017   Procedure: Coronary/Graft Acute MI  Revascularization;  Surgeon: Jettie Booze, MD;  Location: [place] CV LAB;  Service: Cardiovascular;  Laterality: N/A;   KNEE SURGERY     LEFT HEART CATH AND CORS/GRAFTS ANGIOGRAPHY N/A 10/23/2017   Procedure: LEFT HEART CATH AND CORS/GRAFTS ANGIOGRAPHY;  Surgeon: Jettie Booze, MD;  Location: [place] CV LAB;  Service: Cardiovascular;  Laterality: N/A;   TONSILLECTOMY      Current Medications: Current Meds  Medication Sig   apixaban (ELIQUIS) 5 MG TABS tablet TAKE 1 TABLET (5 MG TOTAL) BY MOUTH 2 (TWO) TIMES DAILY. KEEP CARDIOLOGIST APPT FOR REFILLS.   aspirin 81 MG chewable tablet Chew 1 tablet (81 mg total) daily by mouth.   buPROPion (WELLBUTRIN XL) 300 MG 24 hr tablet Take      1 tablet      Daily       for Mood, Focus & Concentration.   ezetimibe (ZETIA) 10 MG tablet TAKE 1 TABLET BY MOUTH EVERY DAY FOR CHOLESTEROL   ferrous sulfate 325 (65 FE) MG tablet Take 325 mg by mouth every Monday, Wednesday, and Friday.   FLUoxetine (PROZAC) 40 MG capsule Take     1 capsule      Daily      for Mood   FUROSEMIDE PO Take 10 mg by mouth daily.   isosorbide mononitrate (IMDUR) 30 MG 24 hr tablet TAKE 1 TABLET DAILY FOR HEART   levothyroxine (SYNTHROID) 100 MCG tablet Take      1 tablet      Daily     on an empty stomach with only water for 30 minutes & no Antacid meds, Calcium or Magnesium for 4 hours & avoid Biotin   metFORMIN (GLUCOPHAGE-XR) 500 MG 24 hr tablet TAKE 2 TABLETS 2 X /DAY WITH MEALS FOR DIABETES   metoprolol succinate (TOPROL-XL) 50 MG 24 hr tablet Take  1 tablet  Daily  for BP & Heart   nitroGLYCERIN (NITROSTAT) 0.4 MG SL tablet Place 1 tablet (0.4 mg total) every 5 (five) minutes as needed under the tongue.   Omega-3 Fatty Acids (FISH OIL) 1000 MG CAPS Take 2,000 mg by mouth daily.   omeprazole (PRILOSEC) 40 MG capsule TAKE 1 CAPSULE DAILY FOR ACID INDIGESTION & REFLUX   potassium chloride SA (K-DUR,KLOR-CON) 20 MEQ tablet Take 20 mEq by mouth daily.    rosuvastatin (CRESTOR) 40 MG tablet Take 40 mg by mouth 3 (three) times a week.   sacubitril-valsartan (ENTRESTO) 49-51 MG Take 1 tablet by mouth 2 (two) times daily.   vitamin B-12 (CYANOCOBALAMIN) 1000 MCG tablet Take 1,000 mcg by mouth daily.   Vitamin D, Ergocalciferol, (DRISDOL) 50000 UNITS CAPS capsule Take 1 capsule (50,000  Units total) by mouth 3 (three) times a week. (Patient taking differently: Take 50,000 Units by mouth once a week.)     Allergies:   Lisinopril   Social History   Socioeconomic History   Marital status: Divorced    Spouse name: Not on file   Number of children: 0   Years of education: Not on file   Highest education level: Not on file  Occupational History   Occupation: Disability   Occupation: Counsellor for [employer]  Tobacco Use   Smoking status: Every Day    Packs/day: 2.00    Years: 40.00    Pack years: 80.00    Types: Cigarettes   Smokeless tobacco: Never   Tobacco comments:    06/02/2021 reports down from 2 ppd/3-4 cigs/day   Vaping Use   Vaping Use: Never used  Substance and Sexual Activity   Alcohol use: No    Alcohol/week: 2.0 standard drinks    Types: 1 Glasses of wine, 1 Shots of liquor per week    Comment: Rarely   Drug use: No   Sexual activity: Not Currently  Other Topics Concern   Not on file  Social History Narrative   Not on file   Social Determinants of Health   Financial Resource Strain: Not on file  Food Insecurity: Not on file  Transportation Needs: Not on file  Physical Activity: Not on file  Stress: Not on file  Social Connections: Not on file     Family History: The patient's family history includes Alzheimer's disease in his mother; Colon cancer in his father and paternal grandmother; Hypertension in his mother. There is no history of Esophageal cancer, Rectal cancer, or Stomach cancer.  ROS:   Please see the history of present illness.     All other systems reviewed and are negative.  EKGs/Labs/Other  Studies Reviewed:    The following studies were reviewed today:     Recent Labs: 04/26/2021: B Natriuretic Peptide 1,171.7 06/02/2021: ALT 8; BUN 20; Creat 1.51; Hemoglobin 14.8; Magnesium 2.0; Platelets 216; Potassium 4.7; Sodium 133; TSH 1.61  Recent Lipid Panel    Component Value Date/Time   CHOL 145 06/02/2021 1046   CHOL 92 (L) 02/11/2018 1011   TRIG 203 (H) 06/02/2021 1046   HDL 62 06/02/2021 1046   HDL 42 02/11/2018 1011   CHOLHDL 2.3 06/02/2021 1046   VLDL 34 10/24/2017 0247   LDLCALC 56 06/02/2021 1046     Physical Exam: Blood pressure 120/70, pulse 61, height '6\' 3"'  (1.905 m), weight 253 lb (114.8 kg), SpO2 97 %.  GEN:  Well nourished, well developed in no acute distress HEENT: Normal NECK: No JVD; No carotid bruits LYMPHATICS: No lymphadenopathy CARDIAC: RRR , no murmurs, rubs, gallops RESPIRATORY:  Clear to auscultation without rales, wheezing or rhonchi  ABDOMEN: Soft, non-tender, non-distended MUSCULOSKELETAL:  No edema; No deformity  SKIN: Warm and dry NEUROLOGIC:  Alert and oriented x 3   EKG:       ASSESSMENT:    No diagnosis found.  PLAN:      CAD: He status post coronary artery bypass grafting in approximately 2013.  Status post stenting in 2018.  He is not having any episodes of angina.  Unfortunately, he continues to smoke.  I strongly encouraged him to stop smoking.  His lipids seem to be well controlled.    2.  Acute on chronic sys. / dias CHF :   Continue current medications.  Lasix, imdur . Toprol XL ,  entresto 49-51 BID   3.  hyperlipidemiia    .  Lipid levels look good.  These are being managed by his primary medical doctor.  4.  Atrial fibrillation: No evidence of further atrial fibrillation at this point.  Medication Adjustments/Labs and Tests Ordered: Current medicines are reviewed at length with the patient today.  Concerns regarding medicines are outlined above.  No orders of the defined types were placed in this  encounter.  No orders of the defined types were placed in this encounter.   Signed, Mertie Moores, MD  07/11/2021 4:17 PM    [facility]

## 2021-07-11 ENCOUNTER — Other Ambulatory Visit: Payer: Self-pay

## 2021-07-11 ENCOUNTER — Encounter: Payer: Self-pay | Admitting: Cardiovascular Disease

## 2021-07-11 ENCOUNTER — Ambulatory Visit: Payer: PPO | Admitting: Cardiovascular Disease

## 2021-07-11 VITALS — BP 120/70 | HR 61 | Ht 75.0 in | Wt 253.0 lb

## 2021-07-11 DIAGNOSIS — I25118 Atherosclerotic heart disease of native coronary artery with other forms of angina pectoris: Secondary | ICD-10-CM

## 2021-07-11 DIAGNOSIS — I4891 Unspecified atrial fibrillation: Secondary | ICD-10-CM | POA: Diagnosis not present

## 2021-07-11 DIAGNOSIS — I5022 Chronic systolic (congestive) heart failure: Secondary | ICD-10-CM | POA: Diagnosis not present

## 2021-07-11 NOTE — Patient Instructions (Signed)
Medication Instructions:  Your physician recommends that you continue on your current medications as directed. Please refer to the Current Medication list given to you today.  *If you need a refill on your cardiac medications before your next appointment, please call your pharmacy*   Follow-Up: At [facility], you and your health needs are our priority.  As part of our continuing mission to provide you with exceptional heart care, we have created designated Provider Care Teams.  These Care Teams include your primary Cardiologist (physician) and Advanced Practice Providers (APPs -  Physician Assistants and Nurse Practitioners) who all work together to provide you with the care you need, when you need it.  Your next appointment:   1 year(s)  The format for your next appointment:   In Person  Provider:   You will see one of the following Advanced Practice Providers on your designated Care Team:   Richardson Dopp, PA-C [place], [place]

## 2021-09-05 ENCOUNTER — Ambulatory Visit (INDEPENDENT_AMBULATORY_CARE_PROVIDER_SITE_OTHER): Payer: PPO | Admitting: Internal Medicine

## 2021-09-05 ENCOUNTER — Encounter: Payer: Self-pay | Admitting: Internal Medicine

## 2021-09-05 ENCOUNTER — Other Ambulatory Visit: Payer: Self-pay

## 2021-09-05 VITALS — BP 114/70 | HR 59 | Temp 97.6°F | Resp 16 | Ht 75.0 in | Wt 259.2 lb

## 2021-09-05 DIAGNOSIS — E559 Vitamin D deficiency, unspecified: Secondary | ICD-10-CM | POA: Diagnosis not present

## 2021-09-05 DIAGNOSIS — Z6835 Body mass index (BMI) 35.0-35.9, adult: Secondary | ICD-10-CM

## 2021-09-05 DIAGNOSIS — K120 Recurrent oral aphthae: Secondary | ICD-10-CM | POA: Diagnosis not present

## 2021-09-05 DIAGNOSIS — I2510 Atherosclerotic heart disease of native coronary artery without angina pectoris: Secondary | ICD-10-CM | POA: Diagnosis not present

## 2021-09-05 DIAGNOSIS — I5022 Chronic systolic (congestive) heart failure: Secondary | ICD-10-CM

## 2021-09-05 DIAGNOSIS — I480 Paroxysmal atrial fibrillation: Secondary | ICD-10-CM | POA: Diagnosis not present

## 2021-09-05 DIAGNOSIS — N1831 Diabetes mellitus due to underlying condition with diabetic chronic kidney disease: Secondary | ICD-10-CM

## 2021-09-05 DIAGNOSIS — Z79899 Other long term (current) drug therapy: Secondary | ICD-10-CM

## 2021-09-05 DIAGNOSIS — J300 Vasomotor rhinitis: Secondary | ICD-10-CM

## 2021-09-05 DIAGNOSIS — E0822 Diabetes mellitus due to underlying condition with diabetic chronic kidney disease: Secondary | ICD-10-CM

## 2021-09-05 DIAGNOSIS — I10 Essential (primary) hypertension: Secondary | ICD-10-CM | POA: Diagnosis not present

## 2021-09-05 DIAGNOSIS — E039 Hypothyroidism, unspecified: Secondary | ICD-10-CM

## 2021-09-05 DIAGNOSIS — E1169 Type 2 diabetes mellitus with other specified complication: Secondary | ICD-10-CM | POA: Diagnosis not present

## 2021-09-05 DIAGNOSIS — E785 Hyperlipidemia, unspecified: Secondary | ICD-10-CM | POA: Diagnosis not present

## 2021-09-05 MED ORDER — IPRATROPIUM BROMIDE 0.06 % NA SOLN: NASAL | 3 refills | Status: AC

## 2021-09-05 MED ORDER — ACYCLOVIR 400 MG PO TABS: ORAL_TABLET | ORAL | 1 refills | Status: DC

## 2021-09-05 NOTE — Progress Notes (Signed)
Future Appointments  Date Time Provider [place]  09/05/2021  4:00 PM Unk Pinto, MD [facility] None  01/04/2022   -   CPE 10:00 AM Unk Pinto, MD [facility] None  06/02/2022   -  Wellness 10:00 AM Liane Comber, NP [facility] None    History of Present Illness:       This very nice 70 y.o. single WM presents for 9 month follow up with HTN, ASCAD, HLD, T2_NIDDM and Vitamin D Deficiency.  Patient has GERD controlled with his meds. In addition , today patient is c/o recurrent mouth ulcers and independently he reports episodic frequent watery rhinorrhea.        Patient is treated for HTN ( 2005) &  had PCA/DES in 2005, then CABG in 2013 and a 2sd MI w/PCA/DES  & CHF in 2018.  In 2019, dx'd Afib and in 2020 had CV by Dr Cathie Olden. BP has been controlled at home. Today's BP is at goal -  114/70.  Patient is on Entresto for Dx of chronic sys CHF.  He continues to smoke 2 ppd. Patient has had no recent complaints of any cardiac type chest pain, palpitations, dyspnea / orthopnea / PND, dizziness, claudication, or dependent edema.       Hyperlipidemia is controlled with diet & Ezetimibe Jaci Standard.   Patient denies myalgias or other med SE's. Last Lipids were at goal except elevated Trig's.   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  Patient  continues to refuses CBG monitoring. Last A1c was not at goal:  Lab Results  Component Value Date   HGBA1C 6.2 (H) 06/02/2021                                                          In 2015,  patient was dx'd Hypothyroid  and initiated on Thyroid replacement.                                                       Further,  the patient also has history of Vitamin D Deficiency ("6" /2008) and supplements vitamin D without any suspected side-effects. Last vitamin D was at goal:  Lab Results  Component Value Date   VD25OH 91 12/16/2020     Current Outpatient Medications on File Prior to Visit  Medication Sig   apixaban (ELIQUIS) 5 MG TABS tablet TAKE 1 TABLET   2 TIMES DAILY   aspirin 81 MG chewable tablet Chew 1 tablet  daily    buPROPion XL 300 MG 24 hr tablet Take 1 tablet Daily    ezetimibe (10 MG tablet TAKE 1 TABLET EVERY DAY    ferrous sulfate 325 (65 FE) MG tablet Take every Monday, Wednesday, and Friday.   FLUoxetine  40 MG capsule Take     1 capsule  Daily      for Mood   FUROSEMIDE 10 mg  Take daily.   IMDUR  30 MG  TAKE 1 TABLET DAILY FOR HEART   levothyroxine 100 MCG tablet Take   1 tablet Daily    metFORMIN -XR 500 MG 24 hr tablet TAKE 2 TABLETS 2 X /DAY    metoprolol succinate-XL 50 MG  Take  1 tablet  Daily    NITROSTAT 0.4 MG SL tablet  As needed chest pain   Omega-3 FISH OIL 1000 MG CAPS Take 2,000 mg  daily.   omeprazole  40 MG capsule TAKE 1 CAPSULE DAILY    potassium chloride  20 MEQ tablet Take 2 daily.   rosuvastatin 40 MG tablet Take 3  times a week.   ENTRESTO  49-51 MG Take 1 tablet 2  times daily.   vitamin B-12 1000 MCG tablet Take daily.   Vitamin D  50,000 u Take 50,000 Units once a week.)    Allergies  Allergen Reactions   Lisinopril Cough    PMHx:   Past Medical History:  Diagnosis Date   Acute inferior myocardial infarction ([place]) 10/24/2017   Adenomatous colon polyp 02/15/2009   Bradycardia    CAD (coronary artery disease)    remote MI in 2004 with PCI; s/p CABG x 3 03/2012   CHF (congestive heart failure) (HCC)    COPD (chronic obstructive pulmonary disease) ([place])    Depression    Family history of malignant neoplasm of gastrointestinal tract    GERD (gastroesophageal reflux disease)    Hyperlipidemia    Hypertension    Hypothyroidism    Myocardial  infarction ([place])    2004   Obesity    Prediabetes    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   LIMA to LAD, SVG to OM1, SVG to LPDA, EVH via right thigh and leg   Tobacco abuse 03/28/2012   Vitamin D deficiency      Immunization History  Administered Date(s) Administered   PFIZER SARS-COV-2 Vacc 04/19/2020, 05/10/2020   PPD Test 06/15/2014, 07/10/2016   Pneumococcal -13 11/17/2019, 12/16/2020   Pneumococcal -23 01/05/2009, 08/10/2014   Td 01/05/2009, 11/17/2019   Zoster, Live 01/03/2016     Past Surgical History:  Procedure Laterality Date   BACK SURGERY     CARDIOVERSION N/A 10/10/2019   Procedure: CARDIOVERSION;  Surgeon: Pixie Casino, MD;  Location: [place];  Service: Cardiovascular;  Laterality: N/A;   COLONOSCOPY  02/15/2009   CORONARY ANGIOPLASTY WITH STENT PLACEMENT     about 8 years ago at [facility]  04/02/2012   Procedure: CORONARY ARTERY BYPASS GRAFTING (CABG);  Surgeon: Rexene Alberts, MD;  Location: [place];  Service: Open Heart Surgery;  Laterality: N/A;  On pump, times three graphs using endoscopically harvested right greater saphenous vein and left internal mammary artery.    CORONARY/GRAFT ACUTE MI REVASCULARIZATION N/A 10/23/2017   Procedure: Coronary/Graft Acute MI Revascularization;  Surgeon: Jettie Booze, MD;  Location: [place] CV LAB;  Service: Cardiovascular;  Laterality: N/A;   KNEE SURGERY     LEFT HEART CATH AND CORS/GRAFTS ANGIOGRAPHY N/A 10/23/2017   Procedure: LEFT HEART CATH AND CORS/GRAFTS ANGIOGRAPHY;  Surgeon: Jettie Booze, MD;  Location: [place] CV LAB;  Service: Cardiovascular;  Laterality: N/A;   TONSILLECTOMY      FHx:    Reviewed / unchanged  SHx:    Reviewed / unchanged   Systems Review:  Constitutional:  Denies fever, chills, wt changes, headaches, insomnia, fatigue, night sweats, change in appetite. Eyes: Denies redness, blurred vision, diplopia, discharge, itchy, watery eyes.  ENT:  Denies discharge, congestion, post nasal drip, epistaxis, sore throat, earache, hearing loss, dental pain, tinnitus, vertigo, sinus pain, snoring.  CV: Denies chest pain, palpitations, irregular heartbeat, syncope, dyspnea, diaphoresis, orthopnea, PND, claudication or edema. Respiratory: denies cough, dyspnea, DOE, pleurisy, hoarseness, laryngitis, wheezing.  Gastrointestinal: Denies dysphagia, odynophagia, heartburn, reflux, water brash, abdominal pain or cramps, nausea, vomiting, bloating, diarrhea, constipation, hematemesis, melena, hematochezia  or hemorrhoids. Genitourinary: Denies dysuria, frequency, urgency, nocturia, hesitancy, discharge, hematuria or flank pain. Musculoskeletal: Denies arthralgias, myalgias, stiffness, jt. swelling, pain, limping or strain/sprain.  Skin: Denies pruritus, rash, hives, warts, acne, eczema or change in skin lesion(s). Neuro: No weakness, tremor, incoordination, spasms, paresthesia or pain. Psychiatric: Denies confusion, memory loss or sensory loss. Endo: Denies change in weight, skin or hair change.  Heme/Lymph: No excessive bleeding, bruising or enlarged lymph nodes.  Physical Exam  BP 114/70   Pulse (!) 59   Temp 97.6 F (36.4 C)   Resp 16   Ht 6\' 3"  (1.905 m)   Wt 259 lb 3.2 oz (117.6 kg)   SpO2 97%   BMI 32.40 kg/m   Appears  over nourished, well groomed  and in no distress.  Eyes: PERRLA, EOMs, conjunctiva no swelling or erythema. Sinuses: No frontal/maxillary tenderness ENT/Mouth: EAC's clear, TM's nl w/o erythema, bulging. Nares clear w/o erythema, swelling, exudates. Oropharynx clear without erythema or exudates. Oral hygiene is good. Tongue normal, non obstructing. Hearing intact.  Neck: Supple. Thyroid not palpable. Car 2+/2+ without bruits, nodes or JVD. Chest: Respirations nl with BS clear & equal w/o rales, rhonchi, wheezing or stridor.  Cor: Heart sounds normal w/ regular rate and rhythm without sig. murmurs, gallops, clicks or  rubs. Peripheral pulses normal and equal  without edema.  Abdomen: Soft & bowel sounds normal. Non-tender w/o guarding, rebound, hernias, masses or organomegaly.  Lymphatics: Unremarkable.  Musculoskeletal: Full ROM all peripheral extremities, joint stability, 5/5 strength and normal gait.  Skin: Warm, dry without exposed rashes, lesions or ecchymosis apparent.  Neuro: Cranial nerves intact, reflexes equal bilaterally. Sensory-motor testing grossly intact. Tendon reflexes grossly intact.  Pysch: Alert & oriented x 3.  Insight and judgement nl & appropriate. No ideations.  Assessment and Plan:  1. Essential hypertension  - Continue medication, monitor blood pressure at home.  - Continue DASH diet.  Reminder to go to the ER if any CP,  SOB, nausea, dizziness, severe HA, changes vision/speech.   - CBC with Differential/Platelet - COMPLETE METABOLIC PANEL WITH GFR - Magnesium - TSH  2. Hyperlipidemia associated with type 2 diabetes mellitus ([place])  - Continue diet/meds, exercise,& lifestyle modifications.  - Continue monitor periodic cholesterol/liver & renal functions    - Lipid panel - TSH  3. 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 Vitamin D deficiency  - Continue diet, exercise  - Lifestyle modifications.  - Monitor appropriate labs. - Continue supplementation.    - VITAMIN D 25 Hydroxy  5. Hypothyroidism  - TSH  6. Paroxysmal atrial fibrillation (HCC)  - TSH  7. Coronary artery disease involving native coronary  artery of native heart without angina pectoris  - Lipid panel  8. Chronic systolic CHF (congestive heart failure) (HCC)  - Lipid panel - TSH  9. Class  2 severe obesity due to excess calories with serious comorbidity  and body mass index (BMI) of 35.0 to 35.9 in adult (HCC)  - TSH  10. Medication management  -  CBC with Differential/Platelet - COMPLETE METABOLIC PANEL WITH GFR - Magnesium - Lipid panel - TSH - Hemoglobin A1c - Insulin, random - VITAMIN D 25 Hydroxy        Discussed  regular exercise, BP monitoring, weight control to achieve/maintain BMI less than 25 and discussed med and SE's.   Rx'd Zovirax for Apthous ulcers & Atrovent nasal for watery vasomotor rhinitis. Recommended labs to assess and monitor clinical status with further disposition pending results of labs.  I discussed the assessment and treatment plan with the patient. The patient was provided an opportunity to ask questions and all were answered. The patient agreed with the plan and demonstrated an understanding of the instructions.  I provided over 30 minutes of exam, counseling, chart review and  complex critical decision making.        The patient was advised to call back or seek an in-person evaluation if the symptoms worsen or if the condition fails to improve as anticipated.   Kirtland Bouchard, MD

## 2021-09-05 NOTE — Patient Instructions (Signed)

## 2021-09-06 LAB — CBC WITH DIFFERENTIAL/PLATELET
Absolute Monocytes: 348 cells/uL (ref 200–950)
Basophils Absolute: 52 cells/uL (ref 0–200)
Basophils Relative: 1 %
Eosinophils Absolute: 182 cells/uL (ref 15–500)
Eosinophils Relative: 3.5 %
HCT: 43 % (ref 38.5–50.0)
Hemoglobin: 14.5 g/dL (ref 13.2–17.1)
Lymphs Abs: 1108 cells/uL (ref 850–3900)
MCH: 29.2 pg (ref 27.0–33.0)
MCHC: 33.7 g/dL (ref 32.0–36.0)
MCV: 86.7 fL (ref 80.0–100.0)
MPV: 11.9 fL (ref 7.5–12.5)
Monocytes Relative: 6.7 %
Neutro Abs: 3510 cells/uL (ref 1500–7800)
Neutrophils Relative %: 67.5 %
Platelets: 223 10*3/uL (ref 140–400)
RBC: 4.96 10*6/uL (ref 4.20–5.80)
RDW: 13.3 % (ref 11.0–15.0)
Total Lymphocyte: 21.3 %
WBC: 5.2 10*3/uL (ref 3.8–10.8)

## 2021-09-06 LAB — HEMOGLOBIN A1C
Hgb A1c MFr Bld: 6.3 % of total Hgb — ABNORMAL HIGH (ref <5.7)
Mean Plasma Glucose: 134 mg/dL
eAG (mmol/L): 7.4 mmol/L

## 2021-09-06 LAB — LIPID PANEL
Cholesterol: 129 mg/dL (ref <200)
HDL: 63 mg/dL (ref >=40)
LDL Cholesterol (Calc): 48 mg/dL (calc)
Non-HDL Cholesterol (Calc): 66 mg/dL (calc) (ref <130)
Total CHOL/HDL Ratio: 2 (calc) (ref <5.0)
Triglycerides: 92 mg/dL (ref <150)

## 2021-09-06 LAB — VITAMIN D 25 HYDROXY (VIT D DEFICIENCY, FRACTURES): Vit D, 25-Hydroxy: 79 ng/mL (ref 30–100)

## 2021-09-06 LAB — COMPLETE METABOLIC PANEL WITH GFR
AG Ratio: 2.2 (calc) (ref 1.0–2.5)
ALT: 10 U/L (ref 9–46)
AST: 13 U/L (ref 10–35)
Albumin: 4.1 g/dL (ref 3.6–5.1)
Alkaline phosphatase (APISO): 72 U/L (ref 35–144)
BUN/Creatinine Ratio: 15 (calc) (ref 6–22)
BUN: 21 mg/dL (ref 7–25)
CO2: 28 mmol/L (ref 20–32)
Calcium: 9.5 mg/dL (ref 8.6–10.3)
Chloride: 100 mmol/L (ref 98–110)
Creat: 1.36 mg/dL — ABNORMAL HIGH (ref 0.70–1.35)
Globulin: 1.9 g/dL (calc) (ref 1.9–3.7)
Glucose, Bld: 104 mg/dL — ABNORMAL HIGH (ref 65–99)
Potassium: 4.6 mmol/L (ref 3.5–5.3)
Sodium: 135 mmol/L (ref 135–146)
Total Bilirubin: 0.5 mg/dL (ref 0.2–1.2)
Total Protein: 6 g/dL — ABNORMAL LOW (ref 6.1–8.1)
eGFR: 57 mL/min/{1.73_m2} — ABNORMAL LOW (ref >=60)

## 2021-09-06 LAB — TSH: TSH: 1.77 mIU/L (ref 0.40–4.50)

## 2021-09-06 LAB — MAGNESIUM: Magnesium: 1.7 mg/dL (ref 1.5–2.5)

## 2021-09-06 LAB — INSULIN, RANDOM: Insulin: 8.7 u[IU]/mL

## 2021-09-06 NOTE — Progress Notes (Signed)
============================================================ -   Test results slightly outside the reference range are not unusual. If there is anything important, I will review this with you,  otherwise it is considered normal test values.  If you have further questions,  please do not hesitate to contact me at the office or via My Chart.  ============================================================ ============================================================  -  Kidney functions appear much improved from stage 3b  back  to Stage 3a   &   almost back to stage 2 - Wonderful ! ============================================================ ============================================================  -  Total Chol = 129    &   LDL Chol = 48    -    Both    Excellent  !  !  !   - Very low risk for Heart Attack  / Stroke ============================================================ ============================================================  -  A1c = 6.3% - still elevated                         ( Ideal or Goal is less than 5.7%  !  )  - So work harder on your diet & Weight Loss !   Being diabetic has a  300% increased risk for heart attack,  stroke, cancer, and alzheimer- type vascular dementia.   It is very important that you work harder with diet by  avoiding all foods that are white except chicken,   fish & calliflower.  - Avoid white rice  (brown & wild rice is OK),   - Avoid white potatoes  (sweet potatoes in moderation is OK),   White bread or wheat bread or anything made out of   white flour like bagels, donuts, rolls, buns, biscuits, cakes,  - pastries, cookies, pizza crust, and pasta (made from  white flour & egg whites)   - vegetarian pasta or spinach or wheat pasta is OK.  - Multigrain breads like Arnold's, Pepperidge Farm or   multigrain sandwich thins or high fiber breads like   E[place]ka bread or "Dave's Killer" breads that are  4 to 5 grams fiber per slice !   are best.    Diet, exercise and weight loss can reverse and cure  diabetes in the early stages.    - Diet, exercise and weight loss is very important in the   control and prevention of complications of diabetes which  affects every system in your body, ie.   -Brain - dementia/stroke,  - eyes - glaucoma/blindness,  - heart - heart attack/heart failure,  - kidneys - dialysis,  - stomach - gastric paralysis,  - intestines - malabsorption,  - nerves - severe painful neuritis,  - circulation - gangrene & loss of a leg(s)  - and finally  . . . . . . . . . . . . . . . . . .    - cancer and Alzheimers. ============================================================ ============================================================  -  Vitamin D = 79 - Excellent - Please Keep dose Same  ! ============================================================ ============================================================  -  All Else - CBC - Kidneys - Electrolytes - Liver - Magnesium & Thyroid    - all  Normal / OK ============================================================ ============================================================  -  Keep up the [place] Work  !  ============================================================ ============================================================

## 2021-09-20 ENCOUNTER — Other Ambulatory Visit: Payer: Self-pay | Admitting: Adult Health

## 2021-09-20 ENCOUNTER — Other Ambulatory Visit: Payer: Self-pay | Admitting: Internal Medicine

## 2021-09-20 DIAGNOSIS — E785 Hyperlipidemia, unspecified: Secondary | ICD-10-CM

## 2021-09-28 ENCOUNTER — Other Ambulatory Visit: Payer: Self-pay | Admitting: Internal Medicine

## 2021-09-28 DIAGNOSIS — K120 Recurrent oral aphthae: Secondary | ICD-10-CM

## 2021-10-08 ENCOUNTER — Other Ambulatory Visit: Payer: Self-pay | Admitting: Internal Medicine

## 2021-10-08 DIAGNOSIS — E039 Hypothyroidism, unspecified: Secondary | ICD-10-CM

## 2021-10-08 DIAGNOSIS — F332 Major depressive disorder, recurrent severe without psychotic features: Secondary | ICD-10-CM

## 2021-10-25 ENCOUNTER — Telehealth: Payer: Self-pay | Admitting: Internal Medicine

## 2021-10-25 NOTE — Chronic Care Management (AMB) (Signed)
  Chronic Care Management   Outreach Note  10/25/2021 Name: Curtis Perez MRN: [id number] DOB: 09/10/1951  Referred by: Unk Pinto, MD Reason for referral : No chief complaint on file.   An unsuccessful telephone outreach was attempted today. The patient was referred to the pharmacist for assistance with care management and care coordination.   Follow Up Plan:   Tatjana Dellinger Upstream Scheduler

## 2021-10-31 ENCOUNTER — Other Ambulatory Visit: Payer: Self-pay | Admitting: Nurse Practitioner

## 2021-10-31 ENCOUNTER — Telehealth: Payer: Self-pay | Admitting: Internal Medicine

## 2021-10-31 DIAGNOSIS — K120 Recurrent oral aphthae: Secondary | ICD-10-CM

## 2021-10-31 NOTE — Chronic Care Management (AMB) (Signed)
  Chronic Care Management   Note  10/31/2021 Name: Curtis Perez MRN: [id number] DOB: 13-Jul-1951  Curtis Perez is a 70 y.o. year old male who is a primary care patient of Unk Pinto, MD. I reached out to Myrtie Hawk by phone today in response to a referral sent by Curtis Perez PCP, Unk Pinto, MD.   [name] was given information about Chronic Care Management services today including:  CCM service includes personalized support from designated clinical staff supervised by his physician, including individualized plan of care and coordination with other care providers 24/7 contact phone numbers for assistance for urgent and routine care needs. Service will only be billed when office clinical staff spend 20 minutes or more in a month to coordinate care. Only one practitioner may furnish and bill the service in a calendar month. The patient may stop CCM services at any time (effective at the end of the month) by phone call to the office staff.   Patient agreed to services and verbal consent obtained.   Follow up plan:   Tatjana Secretary/administrator

## 2021-11-26 ENCOUNTER — Other Ambulatory Visit: Payer: Self-pay | Admitting: Nurse Practitioner

## 2021-11-26 DIAGNOSIS — K120 Recurrent oral aphthae: Secondary | ICD-10-CM

## 2021-12-01 ENCOUNTER — Telehealth: Payer: PPO | Admitting: Pharmacist

## 2021-12-01 ENCOUNTER — Other Ambulatory Visit: Payer: Self-pay

## 2021-12-12 ENCOUNTER — Other Ambulatory Visit: Payer: Self-pay | Admitting: Internal Medicine

## 2021-12-12 DIAGNOSIS — Z1211 Encounter for screening for malignant neoplasm of colon: Secondary | ICD-10-CM

## 2021-12-17 ENCOUNTER — Other Ambulatory Visit: Payer: Self-pay | Admitting: Adult Health

## 2021-12-19 ENCOUNTER — Emergency Department (HOSPITAL_COMMUNITY): Payer: PPO

## 2021-12-19 ENCOUNTER — Encounter (HOSPITAL_COMMUNITY): Payer: Self-pay | Admitting: Emergency Medicine

## 2021-12-19 ENCOUNTER — Emergency Department (HOSPITAL_COMMUNITY)
Admission: EM | Admit: 2021-12-19 | Discharge: 2021-12-20 | Disposition: A | Discharge status: Home / Self Care | Payer: PPO | Attending: Emergency Medicine | Admitting: Emergency Medicine

## 2021-12-19 ENCOUNTER — Other Ambulatory Visit: Payer: Self-pay

## 2021-12-19 DIAGNOSIS — I509 Heart failure, unspecified: Secondary | ICD-10-CM | POA: Diagnosis not present

## 2021-12-19 DIAGNOSIS — I2510 Atherosclerotic heart disease of native coronary artery without angina pectoris: Secondary | ICD-10-CM | POA: Diagnosis not present

## 2021-12-19 DIAGNOSIS — Z7982 Long term (current) use of aspirin: Secondary | ICD-10-CM | POA: Insufficient documentation

## 2021-12-19 DIAGNOSIS — I5033 Acute on chronic diastolic (congestive) heart failure: Secondary | ICD-10-CM

## 2021-12-19 DIAGNOSIS — R062 Wheezing: Secondary | ICD-10-CM | POA: Diagnosis not present

## 2021-12-19 DIAGNOSIS — I10 Essential (primary) hypertension: Secondary | ICD-10-CM | POA: Diagnosis not present

## 2021-12-19 DIAGNOSIS — F17200 Nicotine dependence, unspecified, uncomplicated: Secondary | ICD-10-CM | POA: Diagnosis not present

## 2021-12-19 DIAGNOSIS — R0602 Shortness of breath: Secondary | ICD-10-CM | POA: Diagnosis not present

## 2021-12-19 DIAGNOSIS — I4891 Unspecified atrial fibrillation: Secondary | ICD-10-CM | POA: Diagnosis not present

## 2021-12-19 DIAGNOSIS — Z7901 Long term (current) use of anticoagulants: Secondary | ICD-10-CM | POA: Diagnosis not present

## 2021-12-19 DIAGNOSIS — I110 Hypertensive heart disease with heart failure: Secondary | ICD-10-CM | POA: Diagnosis not present

## 2021-12-19 DIAGNOSIS — I517 Cardiomegaly: Secondary | ICD-10-CM | POA: Diagnosis not present

## 2021-12-19 DIAGNOSIS — I491 Atrial premature depolarization: Secondary | ICD-10-CM | POA: Diagnosis not present

## 2021-12-19 LAB — BASIC METABOLIC PANEL
Anion gap: 9 (ref 5–15)
BUN: 21 mg/dL (ref 8–23)
CO2: 28 mmol/L (ref 22–32)
Calcium: 8.8 mg/dL — ABNORMAL LOW (ref 8.9–10.3)
Chloride: 95 mmol/L — ABNORMAL LOW (ref 98–111)
Creatinine, Ser: 1.49 mg/dL — ABNORMAL HIGH (ref 0.61–1.24)
GFR, Estimated: 51 mL/min — ABNORMAL LOW (ref >60)
Glucose, Bld: 132 mg/dL — ABNORMAL HIGH (ref 70–99)
Potassium: 3.7 mmol/L (ref 3.5–5.1)
Sodium: 132 mmol/L — ABNORMAL LOW (ref 135–145)

## 2021-12-19 LAB — CBC
HCT: 36 % — ABNORMAL LOW (ref 39.0–52.0)
Hemoglobin: 12.2 g/dL — ABNORMAL LOW (ref 13.0–17.0)
MCH: 30.9 pg (ref 26.0–34.0)
MCHC: 33.9 g/dL (ref 30.0–36.0)
MCV: 91.1 fL (ref 80.0–100.0)
Platelets: 225 10*3/uL (ref 150–400)
RBC: 3.95 MIL/uL — ABNORMAL LOW (ref 4.22–5.81)
RDW: 14 % (ref 11.5–15.5)
WBC: 9 10*3/uL (ref 4.0–10.5)
nRBC: 0 % (ref 0.0–0.2)

## 2021-12-19 LAB — BRAIN NATRIURETIC PEPTIDE: B Natriuretic Peptide: 3126.4 pg/mL — ABNORMAL HIGH (ref 0.0–100.0)

## 2021-12-19 LAB — TROPONIN I (HIGH SENSITIVITY)
Troponin I (High Sensitivity): 35 ng/L — ABNORMAL HIGH (ref <18)
Troponin I (High Sensitivity): 35 ng/L — ABNORMAL HIGH (ref <18)

## 2021-12-19 NOTE — ED Provider Notes (Signed)
Emergency Medicine Provider Triage Evaluation Note  TRACEN MAHLER , a 71 y.o. male  was evaluated in triage.  Pt complains of shortness of breath has been worsening over the last couple of days.  History of CHF.  He states that this feels similar to previous exacerbations.  No fever, chills, cough, congestion, chest pain, abdominal pain, nausea, vomiting, diarrhea.  Review of Systems  Positive:  Negative: See above   Physical Exam  BP 132/80    Pulse 64    Temp 98.3 F (36.8 C) (Oral)    Resp 20    SpO2 97%  Gen:   Awake, no distress   Resp:  Normal effort  MSK:   Moves extremities without difficulty  Other:  No pedal edema   Medical Decision Making  Medically screening exam initiated at 7:45 PM.  Appropriate orders placed.  ISIAH SCHEEL was informed that the remainder of the evaluation will be completed by another provider, this initial triage assessment does not replace that evaluation, and the importance of remaining in the ED until their evaluation is complete.     Hendricks Limes, PA-C 12/19/21 Reese, [place], DO 12/19/21 2342

## 2021-12-19 NOTE — ED Triage Notes (Addendum)
Patient arrived with EMS from home reports increasing SOB with wheezing onset last week , received Albuterol nebulizer treatment by EMS with relief. Hx . CHF /CAD/CABG.

## 2021-12-20 LAB — MAGNESIUM: Magnesium: 1.5 mg/dL — ABNORMAL LOW (ref 1.7–2.4)

## 2021-12-20 MED ORDER — FUROSEMIDE 20 MG PO TABS: 20.0000 mg | ORAL_TABLET | Freq: Two times a day (BID) | ORAL | 0 refills | Status: DC

## 2021-12-20 MED ORDER — FUROSEMIDE 10 MG/ML IJ SOLN
40.0000 mg | Freq: Once | INTRAMUSCULAR | Status: AC
  Administered 2021-12-20: 40 mg via INTRAVENOUS
  Filled 2021-12-20: qty 4

## 2021-12-20 MED ORDER — MAGNESIUM SULFATE IN D5W 1-5 GM/100ML-% IV SOLN
1.0000 g | Freq: Once | INTRAVENOUS | Status: AC
  Administered 2021-12-20: 1 g via INTRAVENOUS
  Filled 2021-12-20: qty 100

## 2021-12-20 NOTE — Discharge Instructions (Signed)
You were seen today and likely have an exacerbation of your heart failure.  We will increase your Lasix to 20 mg twice daily for 3 days.  Follow-up with your cardiologist and/or your PCP for recheck at the end of the week.  If you have worsening shortness of breath, develop chest pain, any new or worsening symptoms, you should be reevaluated.

## 2021-12-20 NOTE — ED Provider Notes (Signed)
[facility] EMERGENCY DEPARTMENT Provider Note   CSN: [id number] Arrival date & time: 12/19/21  1900     History  Chief Complaint  Patient presents with   Shortness of Breath    Curtis Perez is a 71 y.o. male.  HPI     This is a 71 year old male with a history of coronary artery disease, CHF, atrial fibrillation, smoking who presents with shortness of breath.  Patient reports worsening shortness of breath over the last 2 to 3 days.  He reports orthopnea.  He states that he takes 10 mg of Lasix daily; however, has noted over the last 1 to 2 days that he may be is not putting out as much urine.  He did note some lower extremity swelling over the weekend.  No recent cough or fevers.  No known sick contacts.  Denies chest pain.  He thinks he may "have some heart failure."  Patient reports a history of irregular heart rate but "they shocked me out of it."  Home Medications Prior to Admission medications   Medication Sig Start Date End Date Taking? Authorizing Provider  acyclovir (ZOVIRAX) 400 MG tablet TAKE 1 TABLET 3 TIMES A DAY FOR MOUTH ULCERS Patient taking differently: Take 400 mg by mouth 3 (three) times daily. TAKE 1 TABLET 3 TIMES A DAY FOR MOUTH ULCERS 11/26/21  Yes Magda Bernheim, NP  apixaban (ELIQUIS) 5 MG TABS tablet TAKE 1 TABLET (5 MG TOTAL) BY MOUTH 2 (TWO) TIMES DAILY. KEEP CARDIOLOGIST APPT FOR REFILLS. Patient taking differently: Take 5 mg by mouth 2 (two) times daily. TAKE 1 TABLET (5 MG TOTAL) BY MOUTH 2 (TWO) TIMES DAILY. KEEP CARDIOLOGIST APPT FOR REFILLS. 05/30/21  Yes Nahser, Wonda Cheng, MD  aspirin 81 MG chewable tablet Chew 1 tablet (81 mg total) daily by mouth. 10/27/17  Yes Reino Bellis B, NP  buPROPion (WELLBUTRIN XL) 300 MG 24 hr tablet Take  1 tablet  Daily  for Mood, Focus & Concentration      (Dx:   f33.2)               /           TAKE 1 TABLET BY MOUTH 10/09/21  Yes Unk Pinto, MD  ezetimibe (ZETIA) 10 MG tablet TAKE 1 TABLET BY  MOUTH EVERY DAY FOR CHOLESTEROL Patient taking differently: Take 10 mg by mouth daily. TAKE 1 TABLET BY MOUTH EVERY DAY FOR CHOLESTEROL 09/20/21  Yes Magda Bernheim, NP  ferrous sulfate 325 (65 FE) MG tablet Take 325 mg by mouth every Monday, Wednesday, and Friday.   Yes [provider]  FLUoxetine (PROZAC) 40 MG capsule Take  1 capsule  Daily for Mood          (Dx:  f33.2)            /          TAKE 1 CAPSULE BY MOUTH Patient taking differently: Take 40 mg by mouth daily. Take  1 capsule  Daily for Mood          (Dx:  f33.2)            /          TAKE 1 CAPSULE BY MOUTH 10/09/21  Yes Unk Pinto, MD  furosemide (LASIX) 20 MG tablet Take 1 tablet (20 mg total) by mouth 2 (two) times daily. 12/20/21  Yes Cicilia Clinger, Barbette Hair, MD  FUROSEMIDE PO Take 10 mg by mouth daily.  Yes [provider]  ipratropium (ATROVENT) 0.06 % nasal spray Use 1 to 2 sprays each nostril 2 to 3 x /day as needed for watery rhinorrhea 09/05/21  Yes Unk Pinto, MD  isosorbide mononitrate (IMDUR) 30 MG 24 hr tablet TAKE 1 TABLET DAILY FOR HEART Patient taking differently: Take 30 mg by mouth daily. Take 1 tablet Daily for Heart 12/18/21  Yes Unk Pinto, MD  levothyroxine (SYNTHROID) 100 MCG tablet Take  1 tablet  Daily  on an empty stomach with only water for 30 minutes & no Antacid meds, Calcium or Magnesium for 4 hours & avoid Biotin                     /         TAKE 1 TABLET BY MOUTH 10/09/21  Yes Unk Pinto, MD  metFORMIN (GLUCOPHAGE-XR) 500 MG 24 hr tablet TAKE 2 TABLETS BY MOUTH TWICE DAILY WITH MEALS FOR DIABETES Patient taking differently: Take 500 mg by mouth 2 (two) times daily. TAKE 2 TABLETS BY MOUTH TWICE DAILY WITH MEALS FOR DIABETES 09/20/21  Yes Magda Bernheim, NP  metoprolol succinate (TOPROL-XL) 50 MG 24 hr tablet TAKE 1 TABLET DAILY FOR BP & HEART Patient taking differently: Take 50 mg by mouth daily. Take  1 tablet  Daily  for BP & Heart 09/20/21  Yes Magda Bernheim, NP  Omega-3 Fatty  Acids (FISH OIL) 1000 MG CAPS Take 2,000 mg by mouth daily.   Yes [provider]  omeprazole (PRILOSEC) 40 MG capsule TAKE 1 CAPSULE DAILY FOR ACID INDIGESTION & REFLUX Patient taking differently: Take 40 mg by mouth daily. Take 1 capsule Daily for Acid Indigestion & Reflux 06/24/21  Yes Liane Comber, NP  potassium chloride SA (K-DUR,KLOR-CON) 20 MEQ tablet Take 20 mEq by mouth daily.   Yes [provider]  rosuvastatin (CRESTOR) 40 MG tablet Take  1 tablet  Daily  for Cholesterol 12/18/21  Yes Unk Pinto, MD  sacubitril-valsartan (ENTRESTO) 49-51 MG Take 1 tablet by mouth 2 (two) times daily. 05/25/21  Yes Nahser, Wonda Cheng, MD  vitamin B-12 (CYANOCOBALAMIN) 1000 MCG tablet Take 1,000 mcg by mouth daily.   Yes [provider]  Vitamin D, Ergocalciferol, (DRISDOL) 50000 UNITS CAPS capsule Take 1 capsule (50,000 Units total) by mouth 3 (three) times a week. Patient taking differently: Take 50,000 Units by mouth once a week. 09/23/15  Yes Rolene Course, PA-C  nitroGLYCERIN (NITROSTAT) 0.4 MG SL tablet Place 1 tablet (0.4 mg total) every 5 (five) minutes as needed under the tongue. Patient not taking: Reported on 12/20/2021 10/26/17   Reino Bellis B, NP      Allergies    Lisinopril    Review of Systems   Review of Systems  Constitutional:  Negative for fever.  Respiratory:  Positive for shortness of breath. Negative for cough.   Cardiovascular:  Positive for leg swelling. Negative for chest pain and palpitations.  Gastrointestinal:  Negative for abdominal pain, nausea and vomiting.  All other systems reviewed and are negative.  Physical Exam Updated Vital Signs BP 129/89    Pulse 75    Temp 98.3 F (36.8 C) (Oral)    Resp (!) 24    SpO2 94%  Physical Exam Vitals and nursing note reviewed.  Constitutional:      Appearance: He is well-developed. He is obese. He is not ill-appearing.  HENT:     Head: Normocephalic and atraumatic.  Eyes:  Pupils: Pupils  are equal, round, and reactive to light.  Cardiovascular:     Rate and Rhythm: Normal rate. Rhythm irregular.     Heart sounds: Normal heart sounds. No murmur heard. Pulmonary:     Effort: Pulmonary effort is normal. No respiratory distress.     Breath sounds: Wheezing present.     Comments: Occasional wheeze, generally diminished breath sounds in all lung fields Abdominal:     General: Bowel sounds are normal.     Palpations: Abdomen is soft.     Tenderness: There is no abdominal tenderness. There is no rebound.  Musculoskeletal:     Cervical back: Neck supple.     Comments: Trace bilateral lower extremity edema  Lymphadenopathy:     Cervical: No cervical adenopathy.  Skin:    General: Skin is warm and dry.  Neurological:     Mental Status: He is alert and oriented to person, place, and time.  Psychiatric:        Mood and Affect: Mood normal.    ED Results / Procedures / Treatments   Labs (all labs ordered are listed, but only abnormal results are displayed) Labs Reviewed  BASIC METABOLIC PANEL - Abnormal; Notable for the following components:      Result Value   Sodium 132 (*)    Chloride 95 (*)    Glucose, Bld 132 (*)    Creatinine, Ser 1.49 (*)    Calcium 8.8 (*)    GFR, Estimated 51 (*)    All other components within normal limits  CBC - Abnormal; Notable for the following components:   RBC 3.95 (*)    Hemoglobin 12.2 (*)    HCT 36.0 (*)    All other components within normal limits  BRAIN NATRIURETIC PEPTIDE - Abnormal; Notable for the following components:   B Natriuretic Peptide 3,126.4 (*)    All other components within normal limits  MAGNESIUM - Abnormal; Notable for the following components:   Magnesium 1.5 (*)    All other components within normal limits  TROPONIN I (HIGH SENSITIVITY) - Abnormal; Notable for the following components:   Troponin I (High Sensitivity) 35 (*)    All other components within normal limits  TROPONIN I (HIGH SENSITIVITY) -  Abnormal; Notable for the following components:   Troponin I (High Sensitivity) 35 (*)    All other components within normal limits    EKG EKG Interpretation  Date/Time:  Monday December 19 2021 19:40:45 EST Ventricular Rate:  87 PR Interval:    QRS Duration: 118 QT Interval:  386 QTC Calculation: 464 R Axis:   29 Text Interpretation: Atrial fib? with PACs/PVCs Inferior infarct , age undetermined Abnormal ECG When compared with ECG of 26-Apr-2021 15:25, PREVIOUS ECG IS PRESENT Confirmed by Thayer Jew ([id number]) on 12/19/2021 11:34:37 PM  Radiology DG Chest 2 View  Result Date: 12/19/2021 CLINICAL DATA:  Shortness of breath.  Wheezing.  History of CHF. EXAM: CHEST - 2 VIEW COMPARISON:  Radiograph 04/26/2021 FINDINGS: Patient is post median sternotomy and CABG. The heart is enlarged. Small amount of fluid in the fissures as well as possible trace pleural effusions. There is asymmetric perihilar densities, right greater than left. Mild septal thickening in the lung bases. No pneumothorax. No acute osseous abnormalities are seen. IMPRESSION: Cardiomegaly post CABG. Asymmetric perihilar densities, right greater than left, may be asymmetric pulmonary edema or pneumonia. Small amount of fluid in the fissures and possible trace pleural effusions. Electronically Signed   By: Threasa Beards  Sanford M.D.   On: 12/19/2021 20:28    Procedures .Critical Care Performed by: Merryl Hacker, MD Authorized by: Merryl Hacker, MD   Critical care provider statement:    Critical care time (minutes):  30   Critical care was necessary to treat or prevent imminent or life-threatening deterioration of the following conditions:  Cardiac failure   Critical care was time spent personally by me on the following activities:  Development of treatment plan with patient or surrogate, discussions with consultants, evaluation of patient's response to treatment, examination of patient, ordering and review of laboratory  studies, ordering and review of radiographic studies, ordering and performing treatments and interventions, pulse oximetry, re-evaluation of patient's condition and review of old charts    Medications Ordered in ED Medications  furosemide (LASIX) injection 40 mg (40 mg Intravenous Given 12/20/21 0056)  magnesium sulfate IVPB 1 g 100 mL (0 g Intravenous Stopped 12/20/21 0231)    ED Course/ Medical Decision Making/ A&P Clinical Course as of 12/20/21 0245  Tue Dec 20, 2021  0243 Patient states he feels well.  Magnesium being replaced.  Patient was able to ambulate and maintain pulse ox greater than 94%.  We will plan to increase Lasix x3 days with close cardiology and PCP follow-up.  Patient is agreeable to plan. [CH]    Clinical Course User Index [CH] Eveny Anastas, Barbette Hair, MD                           Medical Decision Making  This patient presents to the ED for concern of SOB, this involves an extensive number of treatment options, and is a complaint that carries with it a high risk of complications and morbidity.  The differential diagnosis includes CHF, COPD, PNA, covid/flu.  Patient overall nontoxic-appearing.  Does appear volume overloaded.  Likely CHF exacerbation.  Troponin stably elevated.  Doubt primary ACS.  EKG is in A. fib.  He is rate controlled.  Magnesium slightly low.  This was replaced.  BNP elevated.  Patient diuresed with 40 mg of IV Lasix.  Improved.  Able to ambulate and maintain his pulse ox.  Co morbidities that complicate the patient evaluation  CAD, atrial fibrillation, smoking   Additional history obtained:  Additional history obtained from chart review.   Lab Tests:  I Ordered, and personally interpreted labs.  The pertinent results include:  BNP 3126.  Mild hyponatremia.  Creatinine of 1.49 which is patient's baseline.   Imaging Studies ordered:  I ordered imaging studies including Chest xray I independently visualized and interpreted imaging which showed  CHF, pulmonary edema I agree with the radiologist interpretation   Cardiac Monitoring:  The patient was maintained on a cardiac monitor.  I personally viewed and interpreted the cardiac monitored which showed an underlying rhythm of: atrial fibrillation   Medicines ordered and prescription drug management:  I ordered medication including lasix  for pulmonary edema.  I have reviewed the patients home medicines and have made adjustments as needed    Critical Interventions:  Diuresis   Consultations Obtained:  I requested consultation with the n/a,  and discussed lab and imaging findings as well as pertinent plan - they recommend: n/a   Problem List / ED Course:  CHF   Reevaluation:  After the interventions noted above, I reevaluated the patient and found that they have :improved   Social Determinants of Health:  lives at home independently   Dispostion:  After consideration  of the diagnostic results and the patients response to treatment, I feel that the patent would benefit from d/c with increased lasix and close follow-up.         Final Clinical Impression(s) / ED Diagnoses Final diagnoses:  None    Rx / DC Orders ED Discharge Orders          Ordered    Amb referral to AFIB Clinic        12/20/21 0003    furosemide (LASIX) 20 MG tablet  2 times daily        12/20/21 0242              Deavion Dobbs, Barbette Hair, MD 12/20/21 [phone]

## 2021-12-20 NOTE — ED Notes (Signed)
Called [facility] , for return call for DR Mesner

## 2021-12-22 ENCOUNTER — Encounter (HOSPITAL_COMMUNITY): Payer: Self-pay | Admitting: Emergency Medicine

## 2021-12-22 ENCOUNTER — Emergency Department (HOSPITAL_COMMUNITY): Payer: PPO

## 2021-12-22 ENCOUNTER — Emergency Department (HOSPITAL_COMMUNITY)
Admission: EM | Admit: 2021-12-22 | Discharge: 2021-12-22 | Disposition: A | Discharge status: Home / Self Care | Payer: PPO | Attending: Physician Assistant | Admitting: Physician Assistant

## 2021-12-22 DIAGNOSIS — I517 Cardiomegaly: Secondary | ICD-10-CM | POA: Diagnosis not present

## 2021-12-22 DIAGNOSIS — Z5321 Procedure and treatment not carried out due to patient leaving prior to being seen by health care provider: Secondary | ICD-10-CM | POA: Insufficient documentation

## 2021-12-22 DIAGNOSIS — I509 Heart failure, unspecified: Secondary | ICD-10-CM | POA: Insufficient documentation

## 2021-12-22 DIAGNOSIS — R0902 Hypoxemia: Secondary | ICD-10-CM | POA: Diagnosis not present

## 2021-12-22 DIAGNOSIS — R0602 Shortness of breath: Secondary | ICD-10-CM | POA: Diagnosis not present

## 2021-12-22 LAB — CBC WITH DIFFERENTIAL/PLATELET
Abs Immature Granulocytes: 0.04 10*3/uL (ref 0.00–0.07)
Basophils Absolute: 0.1 10*3/uL (ref 0.0–0.1)
Basophils Relative: 1 %
Eosinophils Absolute: 0.2 10*3/uL (ref 0.0–0.5)
Eosinophils Relative: 2 %
HCT: 42 % (ref 39.0–52.0)
Hemoglobin: 13.9 g/dL (ref 13.0–17.0)
Immature Granulocytes: 1 %
Lymphocytes Relative: 13 %
Lymphs Abs: 1 10*3/uL (ref 0.7–4.0)
MCH: 30.2 pg (ref 26.0–34.0)
MCHC: 33.1 g/dL (ref 30.0–36.0)
MCV: 91.3 fL (ref 80.0–100.0)
Monocytes Absolute: 0.7 10*3/uL (ref 0.1–1.0)
Monocytes Relative: 8 %
Neutro Abs: 6.2 10*3/uL (ref 1.7–7.7)
Neutrophils Relative %: 75 %
Platelets: 324 10*3/uL (ref 150–400)
RBC: 4.6 MIL/uL (ref 4.22–5.81)
RDW: 14 % (ref 11.5–15.5)
WBC: 8.2 10*3/uL (ref 4.0–10.5)
nRBC: 0 % (ref 0.0–0.2)

## 2021-12-22 LAB — BASIC METABOLIC PANEL
Anion gap: 10 (ref 5–15)
BUN: 16 mg/dL (ref 8–23)
CO2: 28 mmol/L (ref 22–32)
Calcium: 9.1 mg/dL (ref 8.9–10.3)
Chloride: 96 mmol/L — ABNORMAL LOW (ref 98–111)
Creatinine, Ser: 1.55 mg/dL — ABNORMAL HIGH (ref 0.61–1.24)
GFR, Estimated: 48 mL/min — ABNORMAL LOW (ref >60)
Glucose, Bld: 120 mg/dL — ABNORMAL HIGH (ref 70–99)
Potassium: 4.1 mmol/L (ref 3.5–5.1)
Sodium: 134 mmol/L — ABNORMAL LOW (ref 135–145)

## 2021-12-22 LAB — BRAIN NATRIURETIC PEPTIDE: B Natriuretic Peptide: 884.2 pg/mL — ABNORMAL HIGH (ref 0.0–100.0)

## 2021-12-22 NOTE — ED Notes (Signed)
PT stated he was feeling better so he will come back if his symptoms worsen

## 2021-12-22 NOTE — ED Triage Notes (Signed)
Patient BIB [facility] with complaint of exertional shortness of breath, seen for same a few days ago. 88% on room air after exertion, at rest SpO2 >95% on room air. 20g saline lock in left hand.

## 2021-12-22 NOTE — ED Provider Triage Note (Signed)
Emergency Medicine Provider Triage Evaluation Note  Curtis Perez , Perez 71 y.o. male  was evaluated in triage.  Pt complains of SOB. Here 12/20/20. Chf and afib. Increased lasix, still having LE swelling, sob despite increasing diuretic  Review of Systems  Positive: LE edema, sob Negative: Fever, back pain  Physical Exam  There were no vitals taken for this visit. Gen:   Awake, no distress   Resp:  Normal effort  MSK:   Moves extremities without difficulty  Other:    Medical Decision Making  Medically screening exam initiated at 11:15 AM.  Appropriate orders placed.  Curtis Perez was informed that the remainder of the evaluation will be completed by another provider, this initial triage assessment does not replace that evaluation, and the importance of remaining in the ED until their evaluation is complete.  LE edema, sob   Curtis Gentle A, PA-C 12/22/21 1116

## 2021-12-24 ENCOUNTER — Other Ambulatory Visit: Payer: Self-pay | Admitting: Nurse Practitioner

## 2021-12-24 DIAGNOSIS — K120 Recurrent oral aphthae: Secondary | ICD-10-CM

## 2021-12-27 ENCOUNTER — Other Ambulatory Visit: Payer: Self-pay | Admitting: Cardiovascular Disease

## 2021-12-27 NOTE — Telephone Encounter (Signed)
Pt last saw Dr Acie Fredrickson 07/11/21, last labs 12/22/21 1.55, age 71, weight 117.6kg, based on specified criteria pt is on appropriate dosage of Eliquis 5mg  BID for afib.  Will refill rx.

## 2021-12-30 ENCOUNTER — Ambulatory Visit (HOSPITAL_COMMUNITY): Payer: PPO | Admitting: Physician Assistant

## 2021-12-30 NOTE — Progress Notes (Incomplete)
Primary Care Physician: Unk Pinto, MD Primary Cardiologist: Dr Acie Fredrickson Primary Electrophysiologist: none Referring Physician: [facility]   Curtis Perez is a 71 y.o. male with a history of CAD s/p CABG, HLD, HTN, tobacco abuse, chronic systolic CHF, atrial fibrillation who presents for consultation in the [facility]. Hospitalized in Nov. 2019.  He was started on Eliquis 5 mg twice a day for a CHADS2VASC score of ***. He underwent DCCV on 10/10/19. ***  Today, he denies symptoms of ***palpitations, chest pain, shortness of breath, orthopnea, PND, lower extremity edema, dizziness, presyncope, syncope, snoring, daytime somnolence, bleeding, or neurologic sequela. The patient is tolerating medications without difficulties and is otherwise without complaint today.    Atrial Fibrillation Risk Factors:  he {Action; does/does not:19097} have symptoms or diagnosis of sleep apnea. he {ACTION; IS/IS XNA:[id number]} compliant with CPAP therapy. he {Action; does/does not:19097} have a history of rheumatic fever. he {Action; does/does not:19097} have a history of alcohol use. The patient {Action; does/does not:19097} have a history of early familial atrial fibrillation or other arrhythmias.  he has a BMI of There is no height or weight on file to calculate BMI.. There were no vitals filed for this visit.  Family History  Problem Relation Age of Onset   Hypertension Mother    Alzheimer's disease Mother    Colon cancer Father    Colon cancer Paternal Grandmother    Esophageal cancer Neg Hx    Rectal cancer Neg Hx    Stomach cancer Neg Hx      Atrial Fibrillation Management history:  Previous antiarrhythmic drugs: *** Previous cardioversions: *** Previous ablations: *** CHADS2VASC score: *** Anticoagulation history: ***   Past Medical History:  Diagnosis Date   Acute inferior myocardial infarction ([place]) 10/24/2017   Adenomatous colon polyp 02/15/2009    Bradycardia    CAD (coronary artery disease)    remote MI in 2004 with PCI; s/p CABG x 3 03/2012   CHF (congestive heart failure) (HCC)    COPD (chronic obstructive pulmonary disease) ([place])    Depression    Family history of malignant neoplasm of gastrointestinal tract    GERD (gastroesophageal reflux disease)    Hyperlipidemia    Hypertension    Hypothyroidism    Myocardial infarction ([place])    2004   Obesity    Prediabetes    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   LIMA to LAD, SVG to OM1, SVG to LPDA, EVH via right thigh and leg   Tobacco abuse 03/28/2012   Vitamin D deficiency    Past Surgical History:  Procedure Laterality Date   BACK SURGERY     CARDIOVERSION N/A 10/10/2019   Procedure: CARDIOVERSION;  Surgeon: Pixie Casino, MD;  Location: [place] [facility];  Service: Cardiovascular;  Laterality: N/A;   COLONOSCOPY  02/15/2009   CORONARY ANGIOPLASTY WITH STENT PLACEMENT     about 8 years ago at [facility]   CORONARY ARTERY BYPASS GRAFT  04/02/2012   Procedure: CORONARY ARTERY BYPASS GRAFTING (CABG);  Surgeon: Rexene Alberts, MD;  Location: [place];  Service: Open Heart Surgery;  Laterality: N/A;  On pump, times three graphs using endoscopically harvested right greater saphenous vein and left internal mammary artery.    CORONARY/GRAFT ACUTE MI REVASCULARIZATION N/A 10/23/2017   Procedure: Coronary/Graft Acute MI Revascularization;  Surgeon: Jettie Booze, MD;  Location: [place] [facility];  Service: Cardiovascular;  Laterality: N/A;   KNEE SURGERY     LEFT HEART  CATH AND CORS/GRAFTS ANGIOGRAPHY N/A 10/23/2017   Procedure: LEFT HEART CATH AND CORS/GRAFTS ANGIOGRAPHY;  Surgeon: Jettie Booze, MD;  Location: [place] [facility];  Service: Cardiovascular;  Laterality: N/A;   TONSILLECTOMY      Current Outpatient Medications  Medication Sig Dispense Refill   acyclovir (ZOVIRAX) 400 MG tablet TAKE 1 TABLET 3 TIMES A DAY FOR MOUTH ULCERS 90 tablet 1   apixaban (ELIQUIS) 5 MG TABS  tablet TAKE 1 TABLET (5 MG TOTAL) BY MOUTH 2 (TWO) TIMES DAILY. 60 tablet 5   aspirin 81 MG chewable tablet Chew 1 tablet (81 mg total) daily by mouth.     buPROPion (WELLBUTRIN XL) 300 MG 24 hr tablet Take  1 tablet  Daily  for Mood, Focus & Concentration      (Dx:   f33.2)               /           TAKE 1 TABLET BY MOUTH 90 tablet 3   ezetimibe (ZETIA) 10 MG tablet TAKE 1 TABLET BY MOUTH EVERY DAY FOR CHOLESTEROL (Patient taking differently: Take 10 mg by mouth daily. TAKE 1 TABLET BY MOUTH EVERY DAY FOR CHOLESTEROL) 90 tablet 1   ferrous sulfate 325 (65 FE) MG tablet Take 325 mg by mouth every Monday, Wednesday, and Friday.     FLUoxetine (PROZAC) 40 MG capsule Take  1 capsule  Daily for Mood          (Dx:  f33.2)            /          TAKE 1 CAPSULE BY MOUTH (Patient taking differently: Take 40 mg by mouth daily. Take  1 capsule  Daily for Mood          (Dx:  f33.2)            /          TAKE 1 CAPSULE BY MOUTH) 90 capsule 3   furosemide (LASIX) 20 MG tablet Take 1 tablet (20 mg total) by mouth 2 (two) times daily. 6 tablet 0   FUROSEMIDE PO Take 10 mg by mouth daily.     ipratropium (ATROVENT) 0.06 % nasal spray Use 1 to 2 sprays each nostril 2 to 3 x /day as needed for watery rhinorrhea 45 mL 3   isosorbide mononitrate (IMDUR) 30 MG 24 hr tablet TAKE 1 TABLET DAILY FOR HEART (Patient taking differently: Take 30 mg by mouth daily. Take 1 tablet Daily for Heart) 90 tablet 3   levothyroxine (SYNTHROID) 100 MCG tablet Take  1 tablet  Daily  on an empty stomach with only water for 30 minutes & no Antacid meds, Calcium or Magnesium for 4 hours & avoid Biotin                     /         TAKE 1 TABLET BY MOUTH 90 tablet 3   metFORMIN (GLUCOPHAGE-XR) 500 MG 24 hr tablet TAKE 2 TABLETS BY MOUTH TWICE DAILY WITH MEALS FOR DIABETES (Patient taking differently: Take 500 mg by mouth 2 (two) times daily. TAKE 2 TABLETS BY MOUTH TWICE DAILY WITH MEALS FOR DIABETES) 360 tablet 1   metoprolol succinate (TOPROL-XL)  50 MG 24 hr tablet TAKE 1 TABLET DAILY FOR BP & HEART (Patient taking differently: Take 50 mg by mouth daily. Take  1 tablet  Daily  for BP & Heart) 90  tablet 1   nitroGLYCERIN (NITROSTAT) 0.4 MG SL tablet Place 1 tablet (0.4 mg total) every 5 (five) minutes as needed under the tongue. (Patient not taking: Reported on 12/20/2021) 25 tablet 2   Omega-3 Fatty Acids (FISH OIL) 1000 MG CAPS Take 2,000 mg by mouth daily.     omeprazole (PRILOSEC) 40 MG capsule TAKE 1 CAPSULE DAILY FOR ACID INDIGESTION & REFLUX (Patient taking differently: Take 40 mg by mouth daily. Take 1 capsule Daily for Acid Indigestion & Reflux) 90 capsule 3   potassium chloride SA (K-DUR,KLOR-CON) 20 MEQ tablet Take 20 mEq by mouth daily.     rosuvastatin (CRESTOR) 40 MG tablet Take  1 tablet  Daily  for Cholesterol 90 tablet 3   sacubitril-valsartan (ENTRESTO) 49-51 MG Take 1 tablet by mouth 2 (two) times daily. 180 tablet 3   vitamin B-12 (CYANOCOBALAMIN) 1000 MCG tablet Take 1,000 mcg by mouth daily.     Vitamin D, Ergocalciferol, (DRISDOL) 50000 UNITS CAPS capsule Take 1 capsule (50,000 Units total) by mouth 3 (three) times a week. (Patient taking differently: Take 50,000 Units by mouth once a week.) 30 capsule 3   No current facility-administered medications for this visit.    Allergies  Allergen Reactions   Lisinopril Cough    Social History   Socioeconomic History   Marital status: Divorced    Spouse name: Not on file   Number of children: 0   Years of education: Not on file   Highest education level: Not on file  Occupational History   Occupation: Disability   Occupation: Counsellor for [employer]  Tobacco Use   Smoking status: Every Day    Packs/day: 2.00    Years: 40.00    Pack years: 80.00    Types: Cigarettes   Smokeless tobacco: Never   Tobacco comments:    06/02/2021 reports down from 2 ppd/3-4 cigs/day   Vaping Use   Vaping Use: Never used  Substance and Sexual Activity   Alcohol use: No     Alcohol/week: 2.0 standard drinks    Types: 1 Glasses of wine, 1 Shots of liquor per week    Comment: Rarely   Drug use: No   Sexual activity: Not Currently  Other Topics Concern   Not on file  Social History Narrative   Not on file   Social Determinants of Health   Financial Resource Strain: Not on file  Food Insecurity: Not on file  Transportation Needs: Not on file  Physical Activity: Not on file  Stress: Not on file  Social Connections: Not on file  Intimate Partner Violence: Not on file     ROS- All systems are reviewed and negative except as per the HPI above.  Physical Exam: There were no vitals filed for this visit.  GEN- The patient is a well appearing ***{Desc; male/male:11659}, alert and oriented x 3 today.   Head- normocephalic, atraumatic Eyes-  Sclera clear, conjunctiva pink Ears- hearing intact Oropharynx- clear Neck- supple  Lungs- Clear to ausculation bilaterally, normal work of breathing Heart- ***Regular rate and rhythm, no murmurs, rubs or gallops  GI- soft, NT, ND, + BS Extremities- no clubbing, cyanosis, or edema MS- no significant deformity or atrophy Skin- no rash or lesion Psych- euthymic mood, full affect Neuro- strength and sensation are intact  Wt Readings from Last 3 Encounters:  09/05/21 117.6 kg  07/11/21 114.8 kg  06/02/21 116.1 kg    EKG today demonstrates ***  Echo *** demonstrated ***  Epic records  are reviewed at length today  CHA2DS2-VASc Score =    The patient's score is based upon:   {Click here to calculate score.  REFRESH note before signing. :1}     ASSESSMENT AND PLAN: {Select the correct AFib Diagnosis                 :[id number]}   SignedOliver Barre, PA    12/30/2021 9:16 AM    3. Obesity There is no height or weight on file to calculate BMI. Lifestyle modification was discussed at length including regular exercise and weight reduction. ***  4. Snoring***Obstructive sleep apnea The importance  of adequate treatment of sleep apnea was discussed today in order to improve our ability to maintain sinus rhythm long term. ***  5. ***   Follow up ***   Adline Peals PA-C [facility] [address] [place], [place] [address] [phone] 12/30/2021 9:16 AM

## 2022-01-03 ENCOUNTER — Encounter: Payer: Self-pay | Admitting: Internal Medicine

## 2022-01-03 NOTE — Progress Notes (Addendum)
Annual  Screening/Preventative Visit  & Comprehensive Evaluation & Examination  Future Appointments  Date Time Provider Department  01/04/2022 10:00 AM Unk Pinto, MD [facility]  01/12/2022  3:00 PM Newton Pigg, [facility]  06/02/2022 10:00 AM Liane Comber, NP [facility]  01/09/2023 10:00 AM Unk Pinto, MD [facility]            This very nice 71 y.o. single WM presents for a Screening /Preventative Visit & comprehensive evaluation and management of multiple medical co-morbidities.  Patient has been followed for HTN, HLD, ASCAD, T2_NIDDM  and Vitamin D Deficiency.       HTN predates circa 2005 when presented with ACS underwent PCA/DES. In 2013, patient underwent CABG.  Then he had a 2sd MI in 2018 /PCA & DES.  Patient had CHF& in 2019 had pAfib  & was switched from Brilinta to Eliquis. In Oct 2020, he had CV by Dr Cathie Olden. Patient's BP has been controlled at home.   Patient also has been started on Entresto for for Chronic CHF. Patient continues to smoke 2 ppd.  Today's BP is at goal -  112/70. Patient denies any cardiac symptoms as chest pain, palpitations, shortness of breath, dizziness or ankle swelling.       Patient's hyperlipidemia is controlled with diet and Rosuvastatin//Ezetimibe. Patient denies myalgias or other medication SE's. Last lipids were at goal :  Lab Results  Component Value Date   CHOL 129 09/05/2021   HDL 63 09/05/2021   LDLCALC 48 09/05/2021   TRIG 92 09/05/2021   CHOLHDL 2.0 09/05/2021         Patient has Morbid Obesity (BMI 35+) and was dx'd in 2011 with T2_DM with CKD3a (GFR 48).  Patient denies reactive hypoglycemic symptoms, visual blurring, diabetic polys or paresthesias. Last A1c was not at goal :   Lab Results  Component Value Date   HGBA1C 6.3 (H) 09/05/2021                                             In 2015,  patient was dx'd Hypothyroid  and initiated on Thyroid replacement.         Finally, patient has history of Vitamin  D Deficiency  ("6" /2008)     and last vitamin D was   Lab Results  Component Value Date   VD25OH 79 09/05/2021     Current Outpatient Medications on File Prior to Visit  Medication Sig   acyclovir (ZOVIRAX) 400 MG tablet TAKE 1 TABLET 3 TIMES A DAY FOR MOUTH ULCERS   apixaban (ELIQUIS) 5 MG TABS tablet TAKE 1 TABLET 2 TIMES DAILY.   aspirin 81 MG chewable tablet Chew 1 tablet daily b   buPROPion (WELLBUTRIN XL) 300 MG 24 hr tablet Take  1 tablet  Daily  for Mood, Focus & Concentration      (Dx:   f33.2)      ezetimibe (ZETIA) 10 MG tablet TAKE 1 TABLET EVERY DAY    ferrous sulfate 325 (65 FE) MG tablet Take 325 mg very Monday, Wednesday, and Friday.   FLUoxetine (PROZAC) 40 MG capsule Take  1 capsule  Daily       (Dx:  f33.2)             furosemide (LASIX) 20 MG tablet Take 1 tablet 2 times daily.   FUROSEMIDE PO Take daily.  ipratropium (ATROVENT) 0.06 % nasal spray Use 1 to 2 sprays each nostril 2 to 3 x /day as needed f   isosorbide mononitrate (IMDUR) 30 MG  TAKE 1 TABLET DAILY FOR HEART    levothyroxine 100 MCG tablet Take  1 tablet  Daily     metFORMIN-XR) 500 MG  TAKE 2 TABLETS  TWICE DAILY    metoprolol succinate-XL) 50 MG  TAKE 1 TABLET DAILY    Omega-3 Fatty Acids (FISH OIL) 1000 MG CAPS Take 2,000 mg by mouth daily.   omeprazole (PRILOSEC) 40 MG capsule TAKE 1 CAPSULE DAILY    potassium chloride 20 MEQ tablet Take daily.   rosuvastatin  40 MG tablet Take  1 tablet  Daily    sacubitril-valsartan (ENTRESTO) 49-51 MG Take 1 tablet 2  times daily.   vitamin B-12  1000 MCG tablet Take daily.   Vitamin D 50,000 UNITS  Take 1 capsule once a week.\     Allergies  Allergen Reactions   Lisinopril Cough    Past Medical History:  Diagnosis Date   Acute inferior myocardial infarction ([place]) 10/24/2017   Adenomatous colon polyp 02/15/2009   Bradycardia    CAD (coronary artery disease)    remote MI in 2004 with PCI; s/p CABG x 3 03/2012   CHF (congestive heart failure) ([place])     COPD (chronic obstructive pulmonary disease) ([place])    Depression    Family history of malignant neoplasm of gastrointestinal tract    GERD (gastroesophageal reflux disease)    Hyperlipidemia    Hypertension    Hypothyroidism    Myocardial infarction ([place])    2004   Obesity    Prediabetes    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   LIMA to LAD, SVG to OM1, SVG to LPDA, EVH via right thigh and leg   Tobacco abuse 03/28/2012   Vitamin D deficiency     Health Maintenance  Topic Date Due   OPHTHALMOLOGY EXAM  Never done   Zoster Vaccines- Shingrix (1 of 2) Never done   FOOT EXAM  08/13/2018   COVID-19 Vaccine (3 - Pfizer risk series) 06/07/2020   COLONOSCOPY (Pts 45-65yrs Insurance coverage will need to be confirmed)  06/25/2020   INFLUENZA VACCINE  Never done   Pneumonia Vaccine 55+ Years old  12/16/2021   HEMOGLOBIN A1C  03/05/2022   TETANUS/TDAP  11/16/2029   Hepatitis C Screening  Completed   HPV VACCINES  Aged Out    Immunization History  Administered Date(s) Administered   PFIZER SARS-COV-2 Vacc  04/19/2020, 05/10/2020   PPD Test 06/15/2014, 07/10/2016   Pneumococcal -13 11/17/2019, 12/16/2020   Pneumococcal -23 01/05/2009, 08/10/2014   Td 01/05/2009, 11/17/2019   Zoster, Live 01/03/2016    Last Colon - 06/25/2017 - Dr Havery Moros - Recc 3 yr f/u due July 2021.  On 08/04/2020 - Dr Havery Moros sent letter recommending f/u OV before Colonoscopy.  Patient relates that he is aware that he is overdue.     Past Surgical History:  Procedure Laterality Date   BACK SURGERY     CARDIOVERSION N/A 10/10/2019   Procedure: CARDIOVERSION;  Surgeon: Pixie Casino, MD;  Location: [facility];  Service: Cardiovascular;  Laterality: N/A;   COLONOSCOPY  02/15/2009   CORONARY ANGIOPLASTY WITH STENT PLACEMENT     about 8 years ago at [facility]   CORONARY ARTERY BYPASS GRAFT  04/02/2012   Procedure: CORONARY ARTERY BYPASS GRAFTING (CABG);  Surgeon: Rexene Alberts, MD;  Location: [place];  Service: Open Heart Surgery;  Laterality: N/A;  On pump, times three graphs using endoscopically harvested right greater saphenous vein and left internal mammary artery.    CORONARY/GRAFT ACUTE MI REVASCULARIZATION N/A 10/23/2017   Procedure: Coronary/Graft Acute MI Revascularization;  Surgeon: Jettie Booze, MD;  Location: [place] [facility];  Service: Cardiovascular;  Laterality: N/A;   KNEE SURGERY     LEFT HEART CATH AND CORS/GRAFTS ANGIOGRAPHY N/A 10/23/2017   Procedure: LEFT HEART CATH AND CORS/GRAFTS ANGIOGRAPHY;  Surgeon: Jettie Booze, MD;  Location: [place] [facility];  Service: Cardiovascular;  Laterality: N/A;   TONSILLECTOMY       Family History  Problem Relation Age of Onset   Hypertension Mother    Alzheimer's disease Mother    Colon cancer Father    Colon cancer Paternal Grandmother    Esophageal cancer Neg Hx    Rectal cancer Neg Hx    Stomach cancer Neg Hx      Social History   Tobacco Use   Smoking status: Every Day    Packs/day: 2.00    Years: 40.00    Pack years: 80.00    Types: Cigarettes   Smokeless tobacco: Never   Tobacco comments:    06/02/2021 reports down from 2 ppd/3-4 cigs/day   Vaping Use   Vaping Use: Never used  Substance Use Topics   Alcohol use: No    Alcohol/week: 2.0 standard drinks    Types: 1 Glasses of wine, 1 Shots of liquor per week    Comment: Rarely   Drug use: No      ROS Constitutional: Denies fever, chills, weight loss/gain, headaches, insomnia,  night sweats or change in appetite. Does c/o fatigue. Eyes: Denies redness, blurred vision, diplopia, discharge, itchy or watery eyes.  ENT: Denies discharge, congestion, post nasal drip, epistaxis, sore throat, earache, hearing loss, dental pain, Tinnitus, Vertigo, Sinus pain or snoring.  Cardio: Denies chest pain, palpitations, irregular heartbeat, syncope, dyspnea, diaphoresis, orthopnea, PND, claudication or edema Respiratory: denies cough, dyspnea, DOE, pleurisy,  hoarseness, laryngitis or wheezing.  Gastrointestinal: Denies dysphagia, heartburn, reflux, water brash, pain, cramps, nausea, vomiting, bloating, diarrhea, constipation, hematemesis, melena, hematochezia, jaundice or hemorrhoids Genitourinary: Denies dysuria, frequency, urgency, nocturia, hesitancy, discharge, hematuria or flank pain Musculoskeletal: Denies arthralgia, myalgia, stiffness, Jt. Swelling, pain, limp or strain/sprain. Denies Falls. Skin: Denies puritis, rash, hives, warts, acne, eczema or change in skin lesion Neuro: No weakness, tremor, incoordination, spasms, paresthesia or pain Psychiatric: Denies confusion, memory loss or sensory loss. Denies Depression. Endocrine: Denies change in weight, skin, hair change, nocturia, and paresthesia, diabetic polys, visual blurring or hyper / hypo glycemic episodes.  Heme/Lymph: No excessive bleeding, bruising or enlarged lymph nodes.   Physical Exam  BP 112/70    Pulse 62    Temp 97.8 F (36.6 C)    Resp 18    Ht 6\' 3"  (1.905 m)    Wt 259 lb 3.2 oz (117.6 kg)    SpO2 94%    BMI 32.40 kg/m   General Appearance:   Over nourished and well groomed and in no apparent distress.  Eyes: PERRLA, EOMs, conjunctiva no swelling or erythema, normal fundi and vessels. Sinuses: No frontal/maxillary tenderness ENT/Mouth: EACs patent / TMs  nl. Nares clear without erythema, swelling, mucoid exudates. Oral hygiene is good. No erythema, swelling, or exudate. Tongue normal, non-obstructing. Tonsils not swollen or erythematous. Hearing normal.  Neck: Supple, thyroid not palpable. No bruits, nodes or JVD. Respiratory: Respiratory effort normal.  BS  equal and clear bilateral without rales, rhonci, wheezing or stridor. Cardio: Heart sounds are normal with regular rate and rhythm and no murmurs, rubs or gallops. Peripheral pulses are normal and equal bilaterally without edema. No aortic or femoral bruits. Chest: symmetric with normal excursions and percussion.   Abdomen: Soft, Rotund with Nl bowel sounds. Nontender, no guarding, rebound, hernias, masses, or organomegaly.  Lymphatics: Non tender without lymphadenopathy.  Musculoskeletal: Full ROM all peripheral extremities, joint stability, 5/5 strength, and normal gait. Skin: Warm and dry without rashes, lesions, cyanosis, clubbing or  ecchymosis.  Neuro: Cranial nerves intact, reflexes equal bilaterally. Normal muscle tone, no cerebellar symptoms. Sensation intact.  Pysch: Alert and oriented X 3 with normal affect, insight and judgment appropriate.   Assessment and Plan  1. Annual Preventative/Screening Exam   2. Essential hypertension  - EKG 12-Lead - [place], RETROPERITNL ABD,  LTD - Urinalysis, Routine w reflex microscopic - Microalbumin / creatinine urine ratio - CBC with Differential/Platelet - COMPLETE METABOLIC PANEL WITH GFR - Magnesium - TSH  3. Hyperlipidemia associated with type 2 diabetes mellitus ([place])  - EKG 12-Lead - [place], RETROPERITNL ABD,  LTD - Lipid panel - TSH  4. Diabetes mellitus due to underlying condition with stage 3a  chronic kidney disease, without long-term current use of insulin (HCC)  - EKG 12-Lead - Hemoglobin A1c - Insulin, random  5. Vitamin D deficiency  - VITAMIN D 25 Hydroxy 6. Hypothyroidism, unspecified type  - TSH  7. Paroxysmal atrial fibrillation (HCC)  - EKG 12-Lead - TSH  8. Coronary artery disease involving native coronary  artery of native heart without angina pectoris  - EKG 12-Lead  9. S/P CABG x 3  - EKG 12-Lead  10. BPH with obstruction/lower urinary tract symptoms  - PSA  11. Class 2 severe obesity due to excess calories with serious comorbidity  and body mass index (BMI) of 35.0 to 35.9 in adult ([place])   12. Chronic systolic CHF (congestive heart failure) (HCC)  - EKG 12-Lead  13. 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 Coronary artery disease of native artery of  native heart with stable angina  pectoris (HCC)  - EKG 12-Lead  15. Screening for colorectal cancer  - POC Hemoccult Bld/Stl   16. Prostate cancer screening  - PSA  17. Screening for heart disease  - EKG 12-Lead  18. Screening for AAA (aortic abdominal aneurysm)  - [place], RETROPERITNL ABD,  LTD  19. Medication management  - CBC with Differential/Platelet - COMPLETE METABOLIC PANEL WITH GFR - Magnesium - Lipid panel - TSH - Hemoglobin A1c - Insulin, random - VITAMIN D 25 Hydroxy           Patient was counseled in prudent diet, weight control to achieve/maintain BMI less than 25, BP monitoring, regular exercise and medications as discussed.  Discussed med effects and SE's. Routine screening labs and tests as requested with regular follow-up as recommended. Over 40 minutes of exam, counseling, chart review and high complex critical decision making was performed   Kirtland Bouchard, MD

## 2022-01-03 NOTE — Patient Instructions (Signed)

## 2022-01-04 ENCOUNTER — Encounter: Payer: Self-pay | Admitting: Internal Medicine

## 2022-01-04 ENCOUNTER — Ambulatory Visit (INDEPENDENT_AMBULATORY_CARE_PROVIDER_SITE_OTHER): Payer: PPO | Admitting: Internal Medicine

## 2022-01-04 ENCOUNTER — Other Ambulatory Visit: Payer: Self-pay

## 2022-01-04 VITALS — BP 112/70 | HR 62 | Temp 97.8°F | Resp 18 | Ht 75.0 in | Wt 259.2 lb

## 2022-01-04 DIAGNOSIS — N1831 Chronic kidney disease, stage 3a: Secondary | ICD-10-CM | POA: Diagnosis not present

## 2022-01-04 DIAGNOSIS — Z79899 Other long term (current) drug therapy: Secondary | ICD-10-CM | POA: Diagnosis not present

## 2022-01-04 DIAGNOSIS — Z0000 Encounter for general adult medical examination without abnormal findings: Secondary | ICD-10-CM

## 2022-01-04 DIAGNOSIS — I2510 Atherosclerotic heart disease of native coronary artery without angina pectoris: Secondary | ICD-10-CM

## 2022-01-04 DIAGNOSIS — Z6835 Body mass index (BMI) 35.0-35.9, adult: Secondary | ICD-10-CM

## 2022-01-04 DIAGNOSIS — N401 Enlarged prostate with lower urinary tract symptoms: Secondary | ICD-10-CM | POA: Diagnosis not present

## 2022-01-04 DIAGNOSIS — E559 Vitamin D deficiency, unspecified: Secondary | ICD-10-CM

## 2022-01-04 DIAGNOSIS — N138 Other obstructive and reflux uropathy: Secondary | ICD-10-CM | POA: Diagnosis not present

## 2022-01-04 DIAGNOSIS — F332 Major depressive disorder, recurrent severe without psychotic features: Secondary | ICD-10-CM

## 2022-01-04 DIAGNOSIS — E039 Hypothyroidism, unspecified: Secondary | ICD-10-CM | POA: Diagnosis not present

## 2022-01-04 DIAGNOSIS — I480 Paroxysmal atrial fibrillation: Secondary | ICD-10-CM | POA: Diagnosis not present

## 2022-01-04 DIAGNOSIS — Z951 Presence of aortocoronary bypass graft: Secondary | ICD-10-CM

## 2022-01-04 DIAGNOSIS — Z136 Encounter for screening for cardiovascular disorders: Secondary | ICD-10-CM | POA: Diagnosis not present

## 2022-01-04 DIAGNOSIS — Z0001 Encounter for general adult medical examination with abnormal findings: Secondary | ICD-10-CM

## 2022-01-04 DIAGNOSIS — E1169 Type 2 diabetes mellitus with other specified complication: Secondary | ICD-10-CM | POA: Diagnosis not present

## 2022-01-04 DIAGNOSIS — I10 Essential (primary) hypertension: Secondary | ICD-10-CM

## 2022-01-04 DIAGNOSIS — Z125 Encounter for screening for malignant neoplasm of prostate: Secondary | ICD-10-CM | POA: Diagnosis not present

## 2022-01-04 DIAGNOSIS — E785 Hyperlipidemia, unspecified: Secondary | ICD-10-CM | POA: Diagnosis not present

## 2022-01-04 DIAGNOSIS — I5022 Chronic systolic (congestive) heart failure: Secondary | ICD-10-CM

## 2022-01-04 DIAGNOSIS — E0822 Diabetes mellitus due to underlying condition with diabetic chronic kidney disease: Secondary | ICD-10-CM | POA: Diagnosis not present

## 2022-01-04 DIAGNOSIS — Z1211 Encounter for screening for malignant neoplasm of colon: Secondary | ICD-10-CM

## 2022-01-04 DIAGNOSIS — I25118 Atherosclerotic heart disease of native coronary artery with other forms of angina pectoris: Secondary | ICD-10-CM

## 2022-01-04 MED ORDER — TIRZEPATIDE 2.5 MG/0.5ML ~~LOC~~ SOAJ: 2.5000 mg | SUBCUTANEOUS | 0 refills | Status: AC

## 2022-01-05 NOTE — Progress Notes (Signed)
============================================================ - Test results slightly outside the reference range are not unusual. If there is anything important, I will review this with you,  otherwise it is considered normal test values.  If you have further questions,  please do not hesitate to contact me at the office or via My Chart.  ============================================================ ============================================================  -  Kidney functions sl worse   - dropped from Stage 3a (GFR flow rate 57) down to                                                                          Stage 3b (GFR flow rate 42)    - this means Kidney functions are more  dehydrated    Very important to drink adequate amounts of fluids                                                                              to prevent permanent damage    - Recommend drink at least 6 bottles (16 ounces)                                                                   of fluids /water /day = 96 Oz ~100 oz  - 100 oz = 3,000 cc or 3 liters / day  - >>                                                 That's 1 &1/2 bottles of a 2 liter soda bottle /day !   ============================================================ ============================================================  - Total Chol = 137    &   LDL Chol = 46      Both   Excellent   - Very low risk for Heart Attack  / Stroke ============================================================ ============================================================  -  But   Triglycerides (   216    ) or fats in blood are too high  (goal is less than 150)    - Recommend avoid fried & greasy foods,  sweets / candy,   - Avoid white rice  (brown or wild rice or Quinoa is OK),   - Avoid white potatoes  (sweet potatoes are OK)   - Avoid anything made from white flour  - bagels, doughnuts, rolls, buns, biscuits, white and   wheat breads, pizza  crust and traditional  pasta made of white flour & egg white  - (vegetarian pasta or spinach or wheat pasta is OK).    - Multi-grain bread is OK - like multi-grain flat bread or  sandwich thins.   - Avoid alcohol  in excess.   - Exercise is also important. ============================================================ ============================================================  - A1c = 6.4 % - still high - Ideal or Goal is less than 5.7%  ============================================================ ============================================================  - PSA - Low - Great  ! ============================================================ ============================================================  -  Magnesium  -   1.5   - is  very EXTREMELY  low- goal is betw 2.0 - 2.5,   - So...............  Strongly  Recommend that you take                                                                  Magnesium 500 mg tablet 2 or 3 x /day    - also important to eat lots of  leafy green vegetables   - spinach - Kale - collards - greens - okra - asparagus                                                               - broccoli - quinoa - squash - almonds   - black, red, white beans -  peas - green beans ============================================================ ============================================================  - Thyroid = OK  ============================================================ ============================================================  - Vitamin D = 55 - low   - Recommend stop the 50,000 u 3 x /week   & Change to take 2 Vit D 5,000 u = 10,000 units every day  ============================================================ ============================================================  - All Else - CBC - U/A - Electrolytes - Liver - all  else   Normal / OK ===========================================================

## 2022-01-06 LAB — COMPLETE METABOLIC PANEL WITH GFR
AG Ratio: 1.7 (calc) (ref 1.0–2.5)
ALT: 9 U/L (ref 9–46)
AST: 10 U/L (ref 10–35)
Albumin: 4.1 g/dL (ref 3.6–5.1)
Alkaline phosphatase (APISO): 97 U/L (ref 35–144)
BUN/Creatinine Ratio: 15 (calc) (ref 6–22)
BUN: 26 mg/dL — ABNORMAL HIGH (ref 7–25)
CO2: 30 mmol/L (ref 20–32)
Calcium: 9.5 mg/dL (ref 8.6–10.3)
Chloride: 91 mmol/L — ABNORMAL LOW (ref 98–110)
Creat: 1.75 mg/dL — ABNORMAL HIGH (ref 0.70–1.35)
Globulin: 2.4 g/dL (calc) (ref 1.9–3.7)
Glucose, Bld: 107 mg/dL — ABNORMAL HIGH (ref 65–99)
Potassium: 5.1 mmol/L (ref 3.5–5.3)
Sodium: 130 mmol/L — ABNORMAL LOW (ref 135–146)
Total Bilirubin: 1.2 mg/dL (ref 0.2–1.2)
Total Protein: 6.5 g/dL (ref 6.1–8.1)
eGFR: 42 mL/min/{1.73_m2} — ABNORMAL LOW (ref >=60)

## 2022-01-06 LAB — CBC WITH DIFFERENTIAL/PLATELET
Absolute Monocytes: 587 cells/uL (ref 200–950)
Basophils Absolute: 60 cells/uL (ref 0–200)
Basophils Relative: 0.7 %
Eosinophils Absolute: 111 cells/uL (ref 15–500)
Eosinophils Relative: 1.3 %
HCT: 42.2 % (ref 38.5–50.0)
Hemoglobin: 13.9 g/dL (ref 13.2–17.1)
Lymphs Abs: 612 cells/uL — ABNORMAL LOW (ref 850–3900)
MCH: 29.9 pg (ref 27.0–33.0)
MCHC: 32.9 g/dL (ref 32.0–36.0)
MCV: 90.8 fL (ref 80.0–100.0)
MPV: 11.1 fL (ref 7.5–12.5)
Monocytes Relative: 6.9 %
Neutro Abs: 7132 cells/uL (ref 1500–7800)
Neutrophils Relative %: 83.9 %
Platelets: 318 10*3/uL (ref 140–400)
RBC: 4.65 10*6/uL (ref 4.20–5.80)
RDW: 13.8 % (ref 11.0–15.0)
Total Lymphocyte: 7.2 %
WBC: 8.5 10*3/uL (ref 3.8–10.8)

## 2022-01-06 LAB — URINALYSIS, ROUTINE W REFLEX MICROSCOPIC
Bilirubin Urine: NEGATIVE
Glucose, UA: NEGATIVE
Hgb urine dipstick: NEGATIVE
Ketones, ur: NEGATIVE
Leukocytes,Ua: NEGATIVE
Nitrite: NEGATIVE
Protein, ur: NEGATIVE
Specific Gravity, Urine: 1.015 (ref 1.001–1.035)
pH: 6.5 (ref 5.0–8.0)

## 2022-01-06 LAB — HEMOGLOBIN A1C
Hgb A1c MFr Bld: 6.4 % of total Hgb — ABNORMAL HIGH (ref <5.7)
Mean Plasma Glucose: 137 mg/dL
eAG (mmol/L): 7.6 mmol/L

## 2022-01-06 LAB — LIPID PANEL
Cholesterol: 137 mg/dL (ref <200)
HDL: 63 mg/dL (ref >=40)
LDL Cholesterol (Calc): 46 mg/dL (calc)
Non-HDL Cholesterol (Calc): 74 mg/dL (calc) (ref <130)
Total CHOL/HDL Ratio: 2.2 (calc) (ref <5.0)
Triglycerides: 216 mg/dL — ABNORMAL HIGH (ref <150)

## 2022-01-06 LAB — PSA: PSA: 0.91 ng/mL (ref <=4.00)

## 2022-01-06 LAB — TSH: TSH: 2.01 mIU/L (ref 0.40–4.50)

## 2022-01-06 LAB — MICROALBUMIN / CREATININE URINE RATIO
Creatinine, Urine: 103 mg/dL (ref 20–320)
Microalb Creat Ratio: 12 mcg/mg creat (ref <30)
Microalb, Ur: 1.2 mg/dL

## 2022-01-06 LAB — INSULIN, RANDOM: Insulin: 8.4 u[IU]/mL

## 2022-01-06 LAB — MAGNESIUM: Magnesium: 1.7 mg/dL (ref 1.5–2.5)

## 2022-01-06 LAB — VITAMIN D 25 HYDROXY (VIT D DEFICIENCY, FRACTURES): Vit D, 25-Hydroxy: 55 ng/mL (ref 30–100)

## 2022-01-07 ENCOUNTER — Other Ambulatory Visit: Payer: Self-pay

## 2022-01-07 ENCOUNTER — Encounter (HOSPITAL_COMMUNITY): Payer: Self-pay | Admitting: Emergency Medicine

## 2022-01-07 ENCOUNTER — Inpatient Hospital Stay (HOSPITAL_COMMUNITY)
Admission: EM | Admit: 2022-01-07 | Discharge: 2022-01-11 | DRG: 247 | Disposition: A | Discharge status: Home / Self Care | Payer: PPO | Attending: Cardiology | Admitting: Cardiology

## 2022-01-07 ENCOUNTER — Emergency Department (HOSPITAL_COMMUNITY): Payer: PPO

## 2022-01-07 DIAGNOSIS — E785 Hyperlipidemia, unspecified: Secondary | ICD-10-CM | POA: Diagnosis present

## 2022-01-07 DIAGNOSIS — F32A Depression, unspecified: Secondary | ICD-10-CM | POA: Diagnosis present

## 2022-01-07 DIAGNOSIS — Z86010 Personal history of colonic polyps: Secondary | ICD-10-CM

## 2022-01-07 DIAGNOSIS — R739 Hyperglycemia, unspecified: Secondary | ICD-10-CM | POA: Diagnosis not present

## 2022-01-07 DIAGNOSIS — I472 Ventricular tachycardia, unspecified: Principal | ICD-10-CM | POA: Diagnosis present

## 2022-01-07 DIAGNOSIS — I110 Hypertensive heart disease with heart failure: Secondary | ICD-10-CM | POA: Diagnosis present

## 2022-01-07 DIAGNOSIS — Z888 Allergy status to other drugs, medicaments and biological substances status: Secondary | ICD-10-CM

## 2022-01-07 DIAGNOSIS — I4891 Unspecified atrial fibrillation: Secondary | ICD-10-CM | POA: Diagnosis not present

## 2022-01-07 DIAGNOSIS — Z8249 Family history of ischemic heart disease and other diseases of the circulatory system: Secondary | ICD-10-CM | POA: Diagnosis not present

## 2022-01-07 DIAGNOSIS — M549 Dorsalgia, unspecified: Secondary | ICD-10-CM | POA: Diagnosis not present

## 2022-01-07 DIAGNOSIS — I420 Dilated cardiomyopathy: Secondary | ICD-10-CM | POA: Diagnosis not present

## 2022-01-07 DIAGNOSIS — Z800 Family history of malignant neoplasm of digestive organs: Secondary | ICD-10-CM | POA: Diagnosis not present

## 2022-01-07 DIAGNOSIS — E039 Hypothyroidism, unspecified: Secondary | ICD-10-CM | POA: Diagnosis present

## 2022-01-07 DIAGNOSIS — R000 Tachycardia, unspecified: Secondary | ICD-10-CM | POA: Diagnosis not present

## 2022-01-07 DIAGNOSIS — J449 Chronic obstructive pulmonary disease, unspecified: Secondary | ICD-10-CM | POA: Diagnosis present

## 2022-01-07 DIAGNOSIS — R079 Chest pain, unspecified: Secondary | ICD-10-CM

## 2022-01-07 DIAGNOSIS — Z79890 Hormone replacement therapy (postmenopausal): Secondary | ICD-10-CM

## 2022-01-07 DIAGNOSIS — R0789 Other chest pain: Secondary | ICD-10-CM | POA: Diagnosis not present

## 2022-01-07 DIAGNOSIS — I252 Old myocardial infarction: Secondary | ICD-10-CM

## 2022-01-07 DIAGNOSIS — Z7984 Long term (current) use of oral hypoglycemic drugs: Secondary | ICD-10-CM

## 2022-01-07 DIAGNOSIS — Z951 Presence of aortocoronary bypass graft: Secondary | ICD-10-CM | POA: Diagnosis not present

## 2022-01-07 DIAGNOSIS — R9431 Abnormal electrocardiogram [ECG] [EKG]: Secondary | ICD-10-CM | POA: Diagnosis not present

## 2022-01-07 DIAGNOSIS — Z7901 Long term (current) use of anticoagulants: Secondary | ICD-10-CM | POA: Diagnosis not present

## 2022-01-07 DIAGNOSIS — Z7982 Long term (current) use of aspirin: Secondary | ICD-10-CM

## 2022-01-07 DIAGNOSIS — E559 Vitamin D deficiency, unspecified: Secondary | ICD-10-CM | POA: Diagnosis not present

## 2022-01-07 DIAGNOSIS — I2510 Atherosclerotic heart disease of native coronary artery without angina pectoris: Secondary | ICD-10-CM | POA: Diagnosis present

## 2022-01-07 DIAGNOSIS — N189 Chronic kidney disease, unspecified: Secondary | ICD-10-CM | POA: Diagnosis not present

## 2022-01-07 DIAGNOSIS — Z955 Presence of coronary angioplasty implant and graft: Secondary | ICD-10-CM | POA: Diagnosis not present

## 2022-01-07 DIAGNOSIS — K219 Gastro-esophageal reflux disease without esophagitis: Secondary | ICD-10-CM | POA: Diagnosis present

## 2022-01-07 DIAGNOSIS — I5020 Unspecified systolic (congestive) heart failure: Secondary | ICD-10-CM | POA: Diagnosis not present

## 2022-01-07 DIAGNOSIS — T82855A Stenosis of coronary artery stent, initial encounter: Secondary | ICD-10-CM | POA: Diagnosis not present

## 2022-01-07 DIAGNOSIS — F17210 Nicotine dependence, cigarettes, uncomplicated: Secondary | ICD-10-CM | POA: Diagnosis present

## 2022-01-07 DIAGNOSIS — Z79899 Other long term (current) drug therapy: Secondary | ICD-10-CM | POA: Diagnosis not present

## 2022-01-07 DIAGNOSIS — Z20822 Contact with and (suspected) exposure to covid-19: Secondary | ICD-10-CM | POA: Diagnosis present

## 2022-01-07 DIAGNOSIS — I5022 Chronic systolic (congestive) heart failure: Secondary | ICD-10-CM | POA: Diagnosis present

## 2022-01-07 DIAGNOSIS — R0902 Hypoxemia: Secondary | ICD-10-CM | POA: Diagnosis not present

## 2022-01-07 DIAGNOSIS — N179 Acute kidney failure, unspecified: Secondary | ICD-10-CM | POA: Diagnosis not present

## 2022-01-07 LAB — COMPREHENSIVE METABOLIC PANEL
ALT: 14 U/L (ref 0–44)
AST: 18 U/L (ref 15–41)
Albumin: 3.6 g/dL (ref 3.5–5.0)
Alkaline Phosphatase: 86 U/L (ref 38–126)
Anion gap: 13 (ref 5–15)
BUN: 20 mg/dL (ref 8–23)
CO2: 23 mmol/L (ref 22–32)
Calcium: 9.1 mg/dL (ref 8.9–10.3)
Chloride: 96 mmol/L — ABNORMAL LOW (ref 98–111)
Creatinine, Ser: 2 mg/dL — ABNORMAL HIGH (ref 0.61–1.24)
GFR, Estimated: 36 mL/min — ABNORMAL LOW (ref >60)
Glucose, Bld: 152 mg/dL — ABNORMAL HIGH (ref 70–99)
Potassium: 4.1 mmol/L (ref 3.5–5.1)
Sodium: 132 mmol/L — ABNORMAL LOW (ref 135–145)
Total Bilirubin: 0.7 mg/dL (ref 0.3–1.2)
Total Protein: 6.5 g/dL (ref 6.5–8.1)

## 2022-01-07 LAB — CBC WITH DIFFERENTIAL/PLATELET
Abs Immature Granulocytes: 0.03 10*3/uL (ref 0.00–0.07)
Basophils Absolute: 0.1 10*3/uL (ref 0.0–0.1)
Basophils Relative: 1 %
Eosinophils Absolute: 0 10*3/uL (ref 0.0–0.5)
Eosinophils Relative: 1 %
HCT: 43.8 % (ref 39.0–52.0)
Hemoglobin: 14.2 g/dL (ref 13.0–17.0)
Immature Granulocytes: 0 %
Lymphocytes Relative: 8 %
Lymphs Abs: 0.6 10*3/uL — ABNORMAL LOW (ref 0.7–4.0)
MCH: 29.5 pg (ref 26.0–34.0)
MCHC: 32.4 g/dL (ref 30.0–36.0)
MCV: 91.1 fL (ref 80.0–100.0)
Monocytes Absolute: 0.5 10*3/uL (ref 0.1–1.0)
Monocytes Relative: 7 %
Neutro Abs: 6.3 10*3/uL (ref 1.7–7.7)
Neutrophils Relative %: 83 %
Platelets: 318 10*3/uL (ref 150–400)
RBC: 4.81 MIL/uL (ref 4.22–5.81)
RDW: 14 % (ref 11.5–15.5)
WBC: 7.5 10*3/uL (ref 4.0–10.5)
nRBC: 0 % (ref 0.0–0.2)

## 2022-01-07 LAB — TROPONIN I (HIGH SENSITIVITY)
Troponin I (High Sensitivity): 93 ng/L — ABNORMAL HIGH (ref <18)
Troponin I (High Sensitivity): 98 ng/L — ABNORMAL HIGH (ref <18)

## 2022-01-07 LAB — GLUCOSE, CAPILLARY
Glucose-Capillary: 107 mg/dL — ABNORMAL HIGH (ref 70–99)
Glucose-Capillary: 121 mg/dL — ABNORMAL HIGH (ref 70–99)
Glucose-Capillary: 154 mg/dL — ABNORMAL HIGH (ref 70–99)

## 2022-01-07 LAB — RESP PANEL BY RT-PCR (FLU A&B, COVID) ARPGX2
Influenza A by PCR: NEGATIVE
Influenza B by PCR: NEGATIVE
SARS Coronavirus 2 by RT PCR: NEGATIVE

## 2022-01-07 LAB — HEPARIN LEVEL (UNFRACTIONATED): Heparin Unfractionated: >1.1 IU/mL — ABNORMAL HIGH (ref 0.30–0.70)

## 2022-01-07 LAB — CBG MONITORING, ED
Glucose-Capillary: 109 mg/dL — ABNORMAL HIGH (ref 70–99)
Glucose-Capillary: 117 mg/dL — ABNORMAL HIGH (ref 70–99)

## 2022-01-07 LAB — HIV ANTIBODY (ROUTINE TESTING W REFLEX): HIV Screen 4th Generation wRfx: NONREACTIVE

## 2022-01-07 LAB — MAGNESIUM: Magnesium: 1.6 mg/dL — ABNORMAL LOW (ref 1.7–2.4)

## 2022-01-07 LAB — APTT: aPTT: 39 seconds — ABNORMAL HIGH (ref 24–36)

## 2022-01-07 MED ORDER — NITROGLYCERIN 0.4 MG SL SUBL: 0.4000 mg | SUBLINGUAL_TABLET | SUBLINGUAL | Status: DC | PRN

## 2022-01-07 MED ORDER — INSULIN ASPART 100 UNIT/ML IJ SOLN
0.0000 [IU] | Freq: Three times a day (TID) | INTRAMUSCULAR | Status: DC
  Administered 2022-01-07 – 2022-01-08 (×2): 3 [IU] via SUBCUTANEOUS
  Administered 2022-01-08: 4 [IU] via SUBCUTANEOUS
  Administered 2022-01-10 – 2022-01-11 (×4): 3 [IU] via SUBCUTANEOUS

## 2022-01-07 MED ORDER — ISOSORBIDE MONONITRATE ER 30 MG PO TB24
30.0000 mg | ORAL_TABLET | Freq: Every day | ORAL | Status: DC
  Administered 2022-01-07 – 2022-01-11 (×5): 30 mg via ORAL
  Filled 2022-01-07 (×5): qty 1

## 2022-01-07 MED ORDER — SACUBITRIL-VALSARTAN 49-51 MG PO TABS
1.0000 | ORAL_TABLET | Freq: Two times a day (BID) | ORAL | Status: DC
  Administered 2022-01-07 – 2022-01-11 (×9): 1 via ORAL
  Filled 2022-01-07 (×10): qty 1

## 2022-01-07 MED ORDER — ACETAMINOPHEN 325 MG PO TABS: 650.0000 mg | ORAL_TABLET | ORAL | Status: DC | PRN

## 2022-01-07 MED ORDER — INSULIN ASPART 100 UNIT/ML IJ SOLN: 0.0000 [IU] | Freq: Every day | INTRAMUSCULAR | Status: DC

## 2022-01-07 MED ORDER — MAGNESIUM SULFATE 2 GM/50ML IV SOLN
2.0000 g | Freq: Once | INTRAVENOUS | Status: AC
  Administered 2022-01-07: 2 g via INTRAVENOUS
  Filled 2022-01-07: qty 50

## 2022-01-07 MED ORDER — FERROUS SULFATE 325 (65 FE) MG PO TABS
325.0000 mg | ORAL_TABLET | ORAL | Status: DC
  Administered 2022-01-09 – 2022-01-11 (×2): 325 mg via ORAL
  Filled 2022-01-07 (×3): qty 1

## 2022-01-07 MED ORDER — ACYCLOVIR 400 MG PO TABS
400.0000 mg | ORAL_TABLET | Freq: Three times a day (TID) | ORAL | Status: DC
  Administered 2022-01-07 – 2022-01-10 (×12): 400 mg via ORAL
  Filled 2022-01-07 (×15): qty 1

## 2022-01-07 MED ORDER — OMEGA-3-ACID ETHYL ESTERS 1 G PO CAPS
2.0000 g | ORAL_CAPSULE | Freq: Every day | ORAL | Status: DC
  Administered 2022-01-07 – 2022-01-11 (×5): 2 g via ORAL
  Filled 2022-01-07 (×5): qty 2

## 2022-01-07 MED ORDER — AMIODARONE HCL IN DEXTROSE 360-4.14 MG/200ML-% IV SOLN: 30.0000 mg/h | INTRAVENOUS | Status: DC

## 2022-01-07 MED ORDER — ASPIRIN 81 MG PO CHEW
81.0000 mg | CHEWABLE_TABLET | Freq: Every day | ORAL | Status: DC
  Administered 2022-01-07 – 2022-01-08 (×2): 81 mg via ORAL
  Filled 2022-01-07 (×2): qty 1

## 2022-01-07 MED ORDER — AMIODARONE HCL IN DEXTROSE 360-4.14 MG/200ML-% IV SOLN
INTRAVENOUS | Status: AC
  Filled 2022-01-07: qty 200

## 2022-01-07 MED ORDER — PANTOPRAZOLE SODIUM 40 MG PO TBEC
40.0000 mg | DELAYED_RELEASE_TABLET | Freq: Every day | ORAL | Status: DC
  Administered 2022-01-07 – 2022-01-11 (×5): 40 mg via ORAL
  Filled 2022-01-07 (×5): qty 1

## 2022-01-07 MED ORDER — AMIODARONE HCL IN DEXTROSE 360-4.14 MG/200ML-% IV SOLN: 60.0000 mg/h | INTRAVENOUS | Status: AC

## 2022-01-07 MED ORDER — ROSUVASTATIN CALCIUM 20 MG PO TABS
40.0000 mg | ORAL_TABLET | Freq: Every day | ORAL | Status: DC
  Administered 2022-01-07 – 2022-01-11 (×5): 40 mg via ORAL
  Filled 2022-01-07 (×5): qty 2

## 2022-01-07 MED ORDER — HEPARIN (PORCINE) 25000 UT/250ML-% IV SOLN
1850.0000 [IU]/h | INTRAVENOUS | Status: DC
  Administered 2022-01-07: 09:00:00 1600 [IU]/h via INTRAVENOUS
  Administered 2022-01-08: 1950 [IU]/h via INTRAVENOUS
  Administered 2022-01-08 – 2022-01-09 (×2): 1850 [IU]/h via INTRAVENOUS
  Filled 2022-01-07 (×5): qty 250

## 2022-01-07 MED ORDER — FLUOXETINE HCL 20 MG PO CAPS
40.0000 mg | ORAL_CAPSULE | Freq: Every day | ORAL | Status: DC
  Administered 2022-01-07 – 2022-01-11 (×5): 40 mg via ORAL
  Filled 2022-01-07 (×5): qty 2

## 2022-01-07 MED ORDER — AMIODARONE LOAD VIA INFUSION
150.0000 mg | Freq: Once | INTRAVENOUS | Status: AC
  Administered 2022-01-07: 150 mg via INTRAVENOUS
  Filled 2022-01-07: qty 83.34

## 2022-01-07 MED ORDER — AMIODARONE IV BOLUS ONLY 150 MG/100ML
INTRAVENOUS | Status: AC
  Filled 2022-01-07: qty 100

## 2022-01-07 MED ORDER — METOPROLOL SUCCINATE ER 50 MG PO TB24
50.0000 mg | ORAL_TABLET | Freq: Every day | ORAL | Status: DC
  Administered 2022-01-07: 50 mg via ORAL
  Filled 2022-01-07: qty 2

## 2022-01-07 MED ORDER — BUPROPION HCL ER (XL) 150 MG PO TB24
300.0000 mg | ORAL_TABLET | Freq: Every day | ORAL | Status: DC
  Administered 2022-01-07 – 2022-01-11 (×5): 300 mg via ORAL
  Filled 2022-01-07 (×4): qty 2
  Filled 2022-01-07: qty 1

## 2022-01-07 MED ORDER — LEVOTHYROXINE SODIUM 100 MCG PO TABS
100.0000 ug | ORAL_TABLET | Freq: Every day | ORAL | Status: DC
  Administered 2022-01-07 – 2022-01-11 (×5): 100 ug via ORAL
  Filled 2022-01-07 (×5): qty 1

## 2022-01-07 MED ORDER — ONDANSETRON HCL 4 MG/2ML IJ SOLN: 4.0000 mg | Freq: Four times a day (QID) | INTRAMUSCULAR | Status: DC | PRN

## 2022-01-07 MED ORDER — DIAZEPAM 5 MG PO TABS
5.0000 mg | ORAL_TABLET | Freq: Once | ORAL | Status: AC
  Administered 2022-01-07: 5 mg via ORAL
  Filled 2022-01-07: qty 1

## 2022-01-07 MED ORDER — POTASSIUM CHLORIDE CRYS ER 20 MEQ PO TBCR
20.0000 meq | EXTENDED_RELEASE_TABLET | Freq: Every day | ORAL | Status: DC
  Administered 2022-01-07 – 2022-01-11 (×4): 20 meq via ORAL
  Filled 2022-01-07 (×5): qty 1

## 2022-01-07 MED ORDER — EZETIMIBE 10 MG PO TABS
10.0000 mg | ORAL_TABLET | Freq: Every day | ORAL | Status: DC
  Administered 2022-01-07 – 2022-01-11 (×5): 10 mg via ORAL
  Filled 2022-01-07 (×5): qty 1

## 2022-01-07 NOTE — Progress Notes (Addendum)
ANTICOAGULATION CONSULT NOTE - Initial Consult  Pharmacy Consult for heparin Indication:  CP/Afib  Allergies  Allergen Reactions   Lisinopril Cough    Patient Measurements: Height: 6\' 3"  (190.5 cm) Weight: 125 kg (275 lb 9.2 oz) IBW/kg (Calculated) : 84.5 Heparin Dosing Weight: 110kg  Vital Signs: Temp: 97.7 F (36.5 C) (01/21 0158) Temp Source: Oral (01/21 0158) BP: 102/67 (01/21 0330) Pulse Rate: 81 (01/21 0330)  Labs: Recent Labs    01/04/22 1121 01/07/22 0156 01/07/22 0210  HGB 13.9 14.2  --   HCT 42.2 43.8  --   PLT 318 318  --   CREATININE 1.75* 2.00*  --   TROPONINIHS  --  93* 98*    Estimated Creatinine Clearance: 50.4 mL/min (A) (by C-G formula based on SCr of 2 mg/dL (H)).   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  Goal of Therapy:  Heparin level 0.3-0.7 units/ml aPTT 66-102 seconds Monitor platelets by anticoagulation protocol: Yes   Plan:  At 0830 start heparin infusion at 1600 units/hr and monitor heparin levels, PTT (while Eliquis affects  anti-Xa lab), and CBC.  Wynona Neat, PharmD, BCPS  01/07/2022,6:03 AM

## 2022-01-07 NOTE — ED Triage Notes (Addendum)
Patient arrived with EMS from home reports right upper back pain radiating to right chest with nausea and diaphoresis this evening , he took ASA 325 mg prior to arrival with relief. Denies SOB , his cardiologist is Dr. Cathie Olden.

## 2022-01-07 NOTE — ED Notes (Signed)
Pt self-converted from ventricular tachycardia to atrial fibrillation. EKG of new rhythm captured.

## 2022-01-07 NOTE — ED Notes (Signed)
Pt had 2 minute episode of sustained Vtach. He remained A&O X 4 throughout event. He reported feeling weak, but denied chest pain, SOB, nausea. Pt converted back to Aflutter without intervention. Secure chat sent to MD notifying of situation.

## 2022-01-07 NOTE — ED Provider Notes (Signed)
[facility] Emergency Department Provider Note MRN:  [id number]  Arrival date & time: 01/07/22     Chief Complaint   Chest Pain and Back Pain   History of Present Illness   Curtis Perez is a 71 y.o. year-old male with a history of CAD, CABG presenting to the ED with chief complaint of chest pain.  Chest pain with radiation to the back, present for about 2 hours.  Pain worsened in the waiting room, was found to be in V. tach.  Review of Systems  A thorough review of systems was obtained and all systems are negative except as noted in the HPI and PMH.   Patient's Health History    Past Medical History:  Diagnosis Date   Acute inferior myocardial infarction ([place]) 10/24/2017   Adenomatous colon polyp 02/15/2009   Bradycardia    CAD (coronary artery disease)    remote MI in 2004 with PCI; s/p CABG x 3 03/2012   CHF (congestive heart failure) (HCC)    COPD (chronic obstructive pulmonary disease) ([place])    Depression    Family history of malignant neoplasm of gastrointestinal tract    GERD (gastroesophageal reflux disease)    Hyperlipidemia    Hypertension    Hypothyroidism    Myocardial infarction ([place])    2004   Obesity    Prediabetes    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   LIMA to LAD, SVG to OM1, SVG to LPDA, EVH via right thigh and leg   Tobacco abuse 03/28/2012   Vitamin D deficiency     Past Surgical History:  Procedure Laterality Date   BACK SURGERY     CARDIOVERSION N/A 10/10/2019   Procedure: CARDIOVERSION;  Surgeon: Pixie Casino, MD;  Location: [place];  Service: Cardiovascular;  Laterality: N/A;   COLONOSCOPY  02/15/2009   CORONARY ANGIOPLASTY WITH STENT PLACEMENT     about 8 years ago at [facility]   CORONARY ARTERY BYPASS GRAFT  04/02/2012   Procedure: CORONARY ARTERY BYPASS GRAFTING (CABG);  Surgeon: Rexene Alberts, MD;  Location: [place];  Service: Open Heart Surgery;  Laterality: N/A;  On pump, times three graphs using endoscopically  harvested right greater saphenous vein and left internal mammary artery.    CORONARY/GRAFT ACUTE MI REVASCULARIZATION N/A 10/23/2017   Procedure: Coronary/Graft Acute MI Revascularization;  Surgeon: Jettie Booze, MD;  Location: [place] [facility];  Service: Cardiovascular;  Laterality: N/A;   KNEE SURGERY     LEFT HEART CATH AND CORS/GRAFTS ANGIOGRAPHY N/A 10/23/2017   Procedure: LEFT HEART CATH AND CORS/GRAFTS ANGIOGRAPHY;  Surgeon: Jettie Booze, MD;  Location: [place] [facility];  Service: Cardiovascular;  Laterality: N/A;   TONSILLECTOMY      Family History  Problem Relation Age of Onset   Hypertension Mother    Alzheimer's disease Mother    Colon cancer Father    Colon cancer Paternal Grandmother    Esophageal cancer Neg Hx    Rectal cancer Neg Hx    Stomach cancer Neg Hx     Social History   Socioeconomic History   Marital status: Divorced    Spouse name: Not on file   Number of children: 0   Years of education: Not on file   Highest education level: Not on file  Occupational History   Occupation: Disability   Occupation: Counsellor for [employer]  Tobacco Use   Smoking status: Every Day    Packs/day: 2.00  Years: 40.00    Pack years: 80.00    Types: Cigarettes   Smokeless tobacco: Never   Tobacco comments:    06/02/2021 reports down from 2 ppd/3-4 cigs/day   Vaping Use   Vaping Use: Never used  Substance and Sexual Activity   Alcohol use: No    Alcohol/week: 2.0 standard drinks    Types: 1 Glasses of wine, 1 Shots of liquor per week    Comment: Rarely   Drug use: No   Sexual activity: Not Currently  Other Topics Concern   Not on file  Social History Narrative   Not on file   Social Determinants of Health   Financial Resource Strain: Not on file  Food Insecurity: Not on file  Transportation Needs: Not on file  Physical Activity: Not on file  Stress: Not on file  Social Connections: Not on file  Intimate Partner Violence: Not on file      Physical Exam   Vitals:   01/07/22 0315 01/07/22 0330  BP: 102/61 102/67  Pulse: 82 81  Resp: 17 16  Temp:    SpO2: 97% 98%    CONSTITUTIONAL: Well-appearing, NAD, seems pale NEURO/PSYCH:  Alert and oriented x 3, no focal deficits EYES:  eyes equal and reactive ENT/NECK:  no LAD, no JVD CARDIO: Tachycardic rate, well-perfused, normal S1 and S2 PULM:  CTAB no wheezing or rhonchi GI/GU:  non-distended, non-tender MSK/SPINE:  No gross deformities, no edema SKIN:  no rash, atraumatic   *Additional and/or pertinent findings included in MDM below  Diagnostic and Interventional Summary    EKG Interpretation  Date/Time:  Saturday January 07 2022 01:50:04 EST Ventricular Rate:  185 PR Interval:    QRS Duration: 244 QT Interval:  364 QTC Calculation: 638 R Axis:   -61 Text Interpretation:  Critical Test Result: Arrhythmia Wide QRS tachycardia with frequent Premature ventricular complexes Left axis deviation Right bundle branch block Minimal voltage criteria for LVH, may be normal variant ( R in aVL ) Possible Lateral infarct , age undetermined Inferior infarct , age undetermined Abnormal ECG When compared with ECG of 22-Dec-2021 11:22, PREVIOUS ECG IS PRESENT Confirmed by Gerlene Fee [phone]) on 01/07/2022 3:36:42 AM        EKG Interpretation  Date/Time:  Saturday January 07 2022 02:07:26 EST Ventricular Rate:  96 PR Interval:    QRS Duration: 133 QT Interval:  353 QTC Calculation: 447 R Axis:   76 Text Interpretation: Atrial fibrillation Ventricular premature complex Nonspecific intraventricular conduction delay Inferior infarct, age indeterminate Lateral leads are also involved Confirmed by Gerlene Fee [phone]) on 01/07/2022 3:37:38 AM        Labs Reviewed  COMPREHENSIVE METABOLIC PANEL - Abnormal; Notable for the following components:      Result Value   Sodium 132 (*)    Chloride 96 (*)    Glucose, Bld 152 (*)    Creatinine, Ser 2.00 (*)    GFR, Estimated  36 (*)    All other components within normal limits  CBC WITH DIFFERENTIAL/PLATELET - Abnormal; Notable for the following components:   Lymphs Abs 0.6 (*)    All other components within normal limits  MAGNESIUM - Abnormal; Notable for the following components:   Magnesium 1.6 (*)    All other components within normal limits  TROPONIN I (HIGH SENSITIVITY) - Abnormal; Notable for the following components:   Troponin I (High Sensitivity) 93 (*)    All other components within normal limits  RESP PANEL BY RT-PCR (FLU  A&B, COVID) [id number]  TROPONIN I (HIGH SENSITIVITY)    DG Chest Portable 1 View  Final Result      Medications  amiodarone (NEXTERONE PREMIX) 360-4.14 MG/200ML-% (1.8 mg/mL) IV infusion (has no administration in time range)  amiodarone (NEXTERONE) 150-4.21 MG/100ML-% bolus (has no administration in time range)     Procedures  /  Critical Care .Critical Care Performed by: Maudie Flakes, MD Authorized by: Maudie Flakes, MD   Critical care provider statement:    Critical care time (minutes):  44   Critical care was necessary to treat or prevent imminent or life-threatening deterioration of the following conditions: Ventricular tachycardia.   Critical care was time spent personally by me on the following activities:  Development of treatment plan with patient or surrogate, discussions with consultants, evaluation of patient's response to treatment, examination of patient, ordering and review of laboratory studies, ordering and review of radiographic studies, ordering and performing treatments and interventions, pulse oximetry, re-evaluation of patient's condition and review of old charts  ED Course and Medical Decision Making  Initial Impression and Ddx Chest pain and now ventricular tachycardia, extensive cardiac history.  This raises concern for cardiac chest pain.  Has not had ischemic evaluation in 2 years.  Patient has spontaneously converted out of ventricular  tachycardia into atrial fibrillation.  History of same.  Holding off on amiodarone at this time, patient is asymptomatic, plan is to admit to cardiology.  Past medical/surgical history that increases complexity of ED encounter: CAD status post CABG  Interpretation of Diagnostics I personally reviewed the EKG and my interpretation is as follows: Ventricular tachycardia    Labs reveal troponin of 93 concerning for NSTEMI  Patient Reassessment and Ultimate Disposition/Management To be admitted to cardiology.  Patient management required discussion with the following services or consulting groups:  Cardiology  Complexity of Problems Addressed Acute illness or injury that poses threat of life of bodily function  Additional Data Reviewed and Analyzed Further history obtained from: Recent Consult notes  Factors Impacting ED Encounter Risk Consideration of hospitalization  Barth Kirks. Sedonia Small, [place] [email or web address]  Final Clinical Impressions(s) / ED Diagnoses     ICD-10-CM   1. Chest pain, unspecified type  R07.9     2. Ventricular tachycardia  I47.20       ED Discharge Orders     None        Discharge Instructions Discussed with and Provided to Patient:   Discharge Instructions   None      Maudie Flakes, MD 01/07/22 [phone]

## 2022-01-07 NOTE — ED Notes (Signed)
Per diet order, ice chips given to pt

## 2022-01-07 NOTE — H&P (Signed)
Cardiology Consultation:   Patient ID: FREDDIE DYMEK MRN: [id number]; DOB: 09/12/1951  Admit date: 01/07/2022 Date of Consult: 01/07/2022  PCP:  Unk Pinto, MD   [facility] Providers Cardiologist: Dr. Mertie Moores    Patient Profile:   Curtis Perez is a 71 y.o. male with a hx of CAD s/p 3v-CABG ( LIMA to LAD, SVG to OM1, SVG to [place]) in 2013, DES to Cx 2018, HTN, HLD, Afib on eliquis, HFrEF (LVEF 30-35% June 2022), T2DM, smoker who is being seen 01/07/2022 for the evaluation of arrhythmia at the request of Dr. Sedonia Small.  History of Present Illness:   Mr. Grunder presents with a chief complaint of back pain while he was at rest yesterday morning which lasted for 2 hours and resolved on its own.  He had another episode of back pain last night which brought him to our ER for further evaluation.  He also reports associated symptoms include diaphoresis and heart palpitations.  While he was in our ER, patient developed wide QRS tachycardia and he felt dizzy and hot and here.  Patient is self converted to atrial fibrillation without any intervention.  Patient says he had back pain prior to his CABG and PCI previously, and those two episodes of back pain yesterday appeared to be similar.   Reports compliant with medications. Denies fever, chills, syncope, chest discomfort, heart palpitations, dyspnea, vomiting, PND, abdominal fullness, dysuria, diarrhea, pedal edema or any bleeding events.    Past Medical History:  Diagnosis Date   Acute inferior myocardial infarction ([place]) 10/24/2017   Adenomatous colon polyp 02/15/2009   Bradycardia    CAD (coronary artery disease)    remote MI in 2004 with PCI; s/p CABG x 3 03/2012   CHF (congestive heart failure) (HCC)    COPD (chronic obstructive pulmonary disease) ([place])    Depression    Family history of malignant neoplasm of gastrointestinal tract    GERD (gastroesophageal reflux disease)    Hyperlipidemia    Hypertension    Hypothyroidism     Myocardial infarction ([place])    2004   Obesity    Prediabetes    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   LIMA to LAD, SVG to OM1, SVG to LPDA, EVH via right thigh and leg   Tobacco abuse 03/28/2012   Vitamin D deficiency     Past Surgical History:  Procedure Laterality Date   BACK SURGERY     CARDIOVERSION N/A 10/10/2019   Procedure: CARDIOVERSION;  Surgeon: Pixie Casino, MD;  Location: [place];  Service: Cardiovascular;  Laterality: N/A;   COLONOSCOPY  02/15/2009   CORONARY ANGIOPLASTY WITH STENT PLACEMENT     about 8 years ago at [facility]   CORONARY ARTERY BYPASS GRAFT  04/02/2012   Procedure: CORONARY ARTERY BYPASS GRAFTING (CABG);  Surgeon: Rexene Alberts, MD;  Location: [place];  Service: Open Heart Surgery;  Laterality: N/A;  On pump, times three graphs using endoscopically harvested right greater saphenous vein and left internal mammary artery.    CORONARY/GRAFT ACUTE MI REVASCULARIZATION N/A 10/23/2017   Procedure: Coronary/Graft Acute MI Revascularization;  Surgeon: Jettie Booze, MD;  Location: [place] [facility];  Service: Cardiovascular;  Laterality: N/A;   KNEE SURGERY     LEFT HEART CATH AND CORS/GRAFTS ANGIOGRAPHY N/A 10/23/2017   Procedure: LEFT HEART CATH AND CORS/GRAFTS ANGIOGRAPHY;  Surgeon: Jettie Booze, MD;  Location: [place] [facility];  Service: Cardiovascular;  Laterality: N/A;   TONSILLECTOMY  Home Medications:  Prior to Admission medications   Medication Sig Start Date End Date Taking? Authorizing Provider  acyclovir (ZOVIRAX) 400 MG tablet TAKE 1 TABLET 3 TIMES A DAY FOR MOUTH ULCERS 12/26/21   Magda Bernheim, NP  apixaban (ELIQUIS) 5 MG TABS tablet TAKE 1 TABLET (5 MG TOTAL) BY MOUTH 2 (TWO) TIMES DAILY. 12/27/21   Nahser, Wonda Cheng, MD  aspirin 81 MG chewable tablet Chew 1 tablet (81 mg total) daily by mouth. 10/27/17   Reino Bellis B, NP  buPROPion (WELLBUTRIN XL) 300 MG 24 hr tablet Take  1 tablet  Daily  for Mood, Focus & Concentration       (Dx:   f33.2)               /           TAKE 1 TABLET BY MOUTH 10/09/21   Unk Pinto, MD  ezetimibe (ZETIA) 10 MG tablet TAKE 1 TABLET BY MOUTH EVERY DAY FOR CHOLESTEROL Patient taking differently: Take 10 mg by mouth daily. TAKE 1 TABLET BY MOUTH EVERY DAY FOR CHOLESTEROL 09/20/21   Magda Bernheim, NP  ferrous sulfate 325 (65 FE) MG tablet Take 325 mg by mouth every Monday, Wednesday, and Friday.    [provider]  FLUoxetine (PROZAC) 40 MG capsule Take  1 capsule  Daily for Mood          (Dx:  f33.2)            /          TAKE 1 CAPSULE BY MOUTH Patient taking differently: Take 40 mg by mouth daily. Take  1 capsule  Daily for Mood          (Dx:  f33.2)            /          TAKE 1 CAPSULE BY MOUTH 10/09/21   Unk Pinto, MD  furosemide (LASIX) 20 MG tablet Take 1 tablet (20 mg total) by mouth 2 (two) times daily. 12/20/21   Horton, Barbette Hair, MD  FUROSEMIDE PO Take 10 mg by mouth daily.    [provider]  ipratropium (ATROVENT) 0.06 % nasal spray Use 1 to 2 sprays each nostril 2 to 3 x /day as needed for watery rhinorrhea 09/05/21   Unk Pinto, MD  isosorbide mononitrate (IMDUR) 30 MG 24 hr tablet TAKE 1 TABLET DAILY FOR HEART Patient taking differently: Take 30 mg by mouth daily. Take 1 tablet Daily for Heart 12/18/21   Unk Pinto, MD  levothyroxine (SYNTHROID) 100 MCG tablet Take  1 tablet  Daily  on an empty stomach with only water for 30 minutes & no Antacid meds, Calcium or Magnesium for 4 hours & avoid Biotin                     /         TAKE 1 TABLET BY MOUTH 10/09/21   Unk Pinto, MD  metFORMIN (GLUCOPHAGE-XR) 500 MG 24 hr tablet TAKE 2 TABLETS BY MOUTH TWICE DAILY WITH MEALS FOR DIABETES Patient taking differently: Take 500 mg by mouth 2 (two) times daily. TAKE 2 TABLETS BY MOUTH TWICE DAILY WITH MEALS FOR DIABETES 09/20/21   Magda Bernheim, NP  metoprolol succinate (TOPROL-XL) 50 MG 24 hr tablet TAKE 1 TABLET DAILY FOR BP & HEART Patient taking  differently: Take 50 mg by mouth daily. Take  1 tablet  Daily  for BP &  Heart 09/20/21   Magda Bernheim, NP  nitroGLYCERIN (NITROSTAT) 0.4 MG SL tablet Place 1 tablet (0.4 mg total) every 5 (five) minutes as needed under the tongue. 10/26/17   Cheryln Manly, NP  Omega-3 Fatty Acids (FISH OIL) 1000 MG CAPS Take 2,000 mg by mouth daily.    [provider]  omeprazole (PRILOSEC) 40 MG capsule TAKE 1 CAPSULE DAILY FOR ACID INDIGESTION & REFLUX Patient taking differently: Take 40 mg by mouth daily. Take 1 capsule Daily for Acid Indigestion & Reflux 06/24/21   Liane Comber, NP  potassium chloride SA (K-DUR,KLOR-CON) 20 MEQ tablet Take 20 mEq by mouth daily.    [provider]  rosuvastatin (CRESTOR) 40 MG tablet Take  1 tablet  Daily  for Cholesterol 12/18/21   Unk Pinto, MD  sacubitril-valsartan (ENTRESTO) 49-51 MG Take 1 tablet by mouth 2 (two) times daily. 05/25/21   Nahser, Wonda Cheng, MD  tirzepatide Carbon [facility]) 2.5 MG/0.5ML Pen Inject 2.5 mg into the skin once a week. for T2_Diabetes 01/04/22   Unk Pinto, MD  vitamin B-12 (CYANOCOBALAMIN) 1000 MCG tablet Take 1,000 mcg by mouth daily.    [provider]  Vitamin D, Ergocalciferol, (DRISDOL) 50000 UNITS CAPS capsule Take 1 capsule (50,000 Units total) by mouth 3 (three) times a week. Patient taking differently: Take 50,000 Units by mouth once a week. 09/23/15   Rolene Course, PA-C    Inpatient Medications: Scheduled Meds:  Continuous Infusions:  amiodarone     amiodarone     PRN Meds:   Allergies:    Allergies  Allergen Reactions   Lisinopril Cough    Social History:   Social History   Socioeconomic History   Marital status: Divorced    Spouse name: Not on file   Number of children: 0   Years of education: Not on file   Highest education level: Not on file  Occupational History   Occupation: Disability   Occupation: Counsellor for [employer]  Tobacco Use   Smoking status: Every Day     Packs/day: 2.00    Years: 40.00    Pack years: 80.00    Types: Cigarettes   Smokeless tobacco: Never   Tobacco comments:    06/02/2021 reports down from 2 ppd/3-4 cigs/day   Vaping Use   Vaping Use: Never used  Substance and Sexual Activity   Alcohol use: No    Alcohol/week: 2.0 standard drinks    Types: 1 Glasses of wine, 1 Shots of liquor per week    Comment: Rarely   Drug use: No   Sexual activity: Not Currently  Other Topics Concern   Not on file  Social History Narrative   Not on file   Social Determinants of Health   Financial Resource Strain: Not on file  Food Insecurity: Not on file  Transportation Needs: Not on file  Physical Activity: Not on file  Stress: Not on file  Social Connections: Not on file  Intimate Partner Violence: Not on file    Family History:    Family History  Problem Relation Age of Onset   Hypertension Mother    Alzheimer's disease Mother    Colon cancer Father    Colon cancer Paternal Grandmother    Esophageal cancer Neg Hx    Rectal cancer Neg Hx    Stomach cancer Neg Hx      ROS:  Please see the history of present illness.   All other ROS reviewed and negative.  Physical Exam/Data:   Vitals:   01/07/22 0148 01/07/22 0158 01/07/22 0219 01/07/22 0300  BP:  110/73 105/65 108/68  Pulse:  (!) 57 90 62  Resp:  18 20 20   Temp:  97.7 F (36.5 C)    TempSrc:  Oral    SpO2:  (!) 88% 95% 97%  Weight: 125 kg     Height: 6\' 3"  (1.905 m)      No intake or output data in the 24 hours ending 01/07/22 0327 Last 3 Weights 01/07/2022 01/04/2022 09/05/2021  Weight (lbs) 275 lb 9.2 oz 259 lb 3.2 oz 259 lb 3.2 oz  Weight (kg) 125 kg 117.572 kg 117.572 kg     Body mass index is 34.44 kg/m.  General:  Well nourished, well developed, in no acute distres HEENT: normal Neck: no JVD Vascular: No carotid bruits; Distal pulses 2+ bilaterally Cardiac:  irregularly irregular rhythm RRR; no murmur  Lungs:  clear to auscultation bilaterally, no  wheezing, rhonchi or rales  Abd: soft, nontender, no hepatomegaly  Ext: no edema Musculoskeletal:  No deformities, BUE and BLE strength normal and equal Skin: warm and dry  Neuro:  CNs 2-12 intact, no focal abnormalities noted Psych:  Normal affect   EKG:  The EKG was personally reviewed and demonstrates:  AFib Telemetry:  Telemetry was personally reviewed and demonstrates: AFib    Relevant CV Studies:   Laboratory Data:  High Sensitivity Troponin:   Recent Labs  Lab 12/19/21 1953 12/19/21 2224 01/07/22 0156  TROPONINIHS 35* 35* 93*     Chemistry Recent Labs  Lab 01/04/22 1121 01/07/22 0156  NA 130* 132*  K 5.1 4.1  CL 91* 96*  CO2 30 23  GLUCOSE 107* 152*  BUN 26* 20  CREATININE 1.75* 2.00*  CALCIUM 9.5 9.1  MG 1.7 1.6*  GFRNONAA  --  36*  ANIONGAP  --  13    Recent Labs  Lab 01/04/22 1121 01/07/22 0156  PROT 6.5 6.5  ALBUMIN  --  3.6  AST 10 18  ALT 9 14  ALKPHOS  --  86  BILITOT 1.2 0.7   Lipids  Recent Labs  Lab 01/04/22 1121  CHOL 137  TRIG 216*  HDL 63  LDLCALC 46  CHOLHDL 2.2    Hematology Recent Labs  Lab 01/04/22 1121 01/07/22 0156  WBC 8.5 7.5  RBC 4.65 4.81  HGB 13.9 14.2  HCT 42.2 43.8  MCV 90.8 91.1  MCH 29.9 29.5  MCHC 32.9 32.4  RDW 13.8 14.0  PLT 318 318   Thyroid  Recent Labs  Lab 01/04/22 1121  TSH 2.01    BNPNo results for input(s): BNP, PROBNP in the last 168 hours.  DDimer No results for input(s): DDIMER in the last 168 hours.   Radiology/Studies:  DG Chest Portable 1 View  Result Date: 01/07/2022 CLINICAL DATA:  Chest pain EXAM: PORTABLE CHEST 1 VIEW COMPARISON:  12/22/2021 FINDINGS: The heart size and mediastinal contours are within normal limits. Coronary artery bypass grafting has been performed. Both lungs are clear; right perihilar pulmonary infiltrate has resolved. The visualized skeletal structures are unremarkable. IMPRESSION: Resolved perihilar pulmonary infiltrate. No radiographic evidence of  acute cardiopulmonary disease. Electronically Signed   By: Fidela Salisbury M.D.   On: 01/07/2022 02:34     Assessment and Plan:   #Wide QRS tachycardia -ECG appears to be VT, especially given his ischemic cardiac history -agree with amiodarone drip -continue Telemetry and correct electrolytes  -will need an invasive coronary  angiogram  #Atrial fibrillation with RVR -patient states he has never missed his eliquis -start heparin gtt -on amiodarone gtt  #Hx of CAD s/p 3v-CABG (LIMA to LAD, SVG to OM1, SVG to LPDA) in 2013, DES to Cx 2018 -continue to trend troponin -continue ASA, imdur, crestor and zetia  #HFrEF -acquire a TTE -continue home metoprolol, entresto   #Acute on CKD -continue to monitor -avoid nephrotoxins -hold his home lasix  #T2DM -insulin sliding scale  Risk Assessment/Risk Scores:   For questions or updates, please contact [facility] Please consult [email or web address] for contact info under    Signed, Laurice Record, MD  01/07/2022 3:27 AM

## 2022-01-07 NOTE — ED Notes (Addendum)
While taking EKG, patient went into [place]. Patient remained responsive and alert saying "I'm feeling really dizzy" and "its getting really hot in here". Triage nurse and PA notified during occurrence and patient was rushed back to Tra[facility] and placed on the [place]. Patient now out of Vt[place] and being monitored.

## 2022-01-07 NOTE — Progress Notes (Signed)
ANTICOAGULATION CONSULT NOTE   Pharmacy Consult for heparin Indication:  CP/Afib  Allergies  Allergen Reactions   Lisinopril Cough    Patient Measurements: Height: 6\' 3"  (190.5 cm) Weight: 125 kg (275 lb 9.2 oz) IBW/kg (Calculated) : 84.5 Heparin Dosing Weight: 110kg  Vital Signs: Temp: 96.9 F (36.1 C) (01/21 1438) Temp Source: Oral (01/21 1438) BP: 142/68 (01/21 1438) Pulse Rate: 77 (01/21 1438)  Labs: Recent Labs    01/07/22 0156 01/07/22 0210 01/07/22 1535  HGB 14.2  --   --   HCT 43.8  --   --   PLT 318  --   --   APTT  --   --  39*  HEPARINUNFRC  --   --  >1.10*  CREATININE 2.00*  --   --   TROPONINIHS 93* 98*  --      Estimated Creatinine Clearance: 50.4 mL/min (A) (by C-G formula based on SCr of 2 mg/dL (H)).   Medical History: Past Medical History:  Diagnosis Date   Acute inferior myocardial infarction ([place]) 10/24/2017   Adenomatous colon polyp 02/15/2009   Bradycardia    CAD (coronary artery disease)    remote MI in 2004 with PCI; s/p CABG x 3 03/2012   CHF (congestive heart failure) (HCC)    COPD (chronic obstructive pulmonary disease) ([place])    Depression    Family history of malignant neoplasm of gastrointestinal tract    GERD (gastroesophageal reflux disease)    Hyperlipidemia    Hypertension    Hypothyroidism    Myocardial infarction ([place])    2004   Obesity    Prediabetes    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   LIMA to LAD, SVG to OM1, SVG to LPDA, EVH via right thigh and leg   Tobacco abuse 03/28/2012   Vitamin D deficiency     Assessment: 71yo male c/o "back pain" associated with diaphoresis and heart palpitations and that felt similar to prior CAD event, on Eliquis for Afib PTA (last dose 1/20 2030), to transition to heparin during further w/u. Plans for cath then ICD for ventricular tachycardia -heparin level > 1.1, aPTT= 39  Goal of Therapy:  Heparin level 0.3-0.7 units/ml aPTT 66-102 seconds Monitor platelets by anticoagulation protocol:  Yes   Plan:  -No heparin bolus -Increase heparin to 1950 units/hr -heparin level, aPTT and CBC in am  Hildred Laser, PharmD Clinical Pharmacist **Pharmacist phone directory can now be found on [email or web address] ([email or web address]).  Listed under [place].

## 2022-01-07 NOTE — Consult Note (Signed)
Electrophysiology Consultation:   Patient ID: Curtis Perez MRN: [id number]; DOB: [birth year]  Admit date: 01/07/2022 Date of Consult: 01/07/2022  PCP:  Curtis Pinto, MD    Patient Profile:   Curtis Perez is a 71 y.o. male with a hx of CAD s/p CABG in 2013 and PCI in 2018 with evidence of inferior wall scarring by echo/ecg who is being seen 01/07/2022 for the evaluation of VT at the request of Curtis Perez.  History of Present Illness:   Curtis Perez presented to the hospital with back pain at rest lasting for several hours. He had a subsequent episode of the same back pain that prompted him to go to the ER. He also reported palpitations with the pain. While in the ER, he experienced VT which was symptomatic. He converted without intervention out of VT and into AF. Troponins are elevated but without a significant delta.    Past Medical History:  Diagnosis Date   Acute inferior myocardial infarction ([place]) 10/24/2017   Adenomatous colon polyp 02/15/2009   Bradycardia    CAD (coronary artery disease)    remote MI in 2004 with PCI; s/p CABG x 3 03/2012   CHF (congestive heart failure) (HCC)    COPD (chronic obstructive pulmonary disease) ([place])    Depression    Family history of malignant neoplasm of gastrointestinal tract    GERD (gastroesophageal reflux disease)    Hyperlipidemia    Hypertension    Hypothyroidism    Myocardial infarction ([place])    2004   Obesity    Prediabetes    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   LIMA to LAD, SVG to OM1, SVG to LPDA, EVH via right thigh and leg   Tobacco abuse 03/28/2012   Vitamin D deficiency     Past Surgical History:  Procedure Laterality Date   BACK SURGERY     CARDIOVERSION N/A 10/10/2019   Procedure: CARDIOVERSION;  Surgeon: Pixie Casino, MD;  Location: [place];  Service: Cardiovascular;  Laterality: N/A;   COLONOSCOPY  02/15/2009   CORONARY ANGIOPLASTY WITH STENT PLACEMENT     about 8 years ago at [facility]   CORONARY ARTERY BYPASS GRAFT   04/02/2012   Procedure: CORONARY ARTERY BYPASS GRAFTING (CABG);  Surgeon: Rexene Alberts, MD;  Location: [place];  Service: Open Heart Surgery;  Laterality: N/A;  On pump, times three graphs using endoscopically harvested right greater saphenous vein and left internal mammary artery.    CORONARY/GRAFT ACUTE MI REVASCULARIZATION N/A 10/23/2017   Procedure: Coronary/Graft Acute MI Revascularization;  Surgeon: Jettie Booze, MD;  Location: [place] [facility];  Service: Cardiovascular;  Laterality: N/A;   KNEE SURGERY     LEFT HEART CATH AND CORS/GRAFTS ANGIOGRAPHY N/A 10/23/2017   Procedure: LEFT HEART CATH AND CORS/GRAFTS ANGIOGRAPHY;  Surgeon: Jettie Booze, MD;  Location: [place] [facility];  Service: Cardiovascular;  Laterality: N/A;   TONSILLECTOMY         Inpatient Medications: Scheduled Meds:  acyclovir  400 mg Oral TID   aspirin  81 mg Oral Daily   buPROPion  300 mg Oral Daily   ezetimibe  10 mg Oral Daily   [START ON 01/09/2022] ferrous sulfate  325 mg Oral Q M,W,F   FLUoxetine  40 mg Oral Daily   insulin aspart  0-20 Units Subcutaneous TID WC   insulin aspart  0-5 Units Subcutaneous QHS   isosorbide mononitrate  30 mg Oral Daily   levothyroxine  100 mcg Oral  Q0600   metoprolol succinate  50 mg Oral Daily   omega-3 acid ethyl esters  2 g Oral Daily   pantoprazole  40 mg Oral Daily   potassium chloride SA  20 mEq Oral Daily   rosuvastatin  40 mg Oral Daily   sacubitril-valsartan  1 tablet Oral BID   Continuous Infusions:  amiodarone     heparin 1,600 Units/hr (01/07/22 0927)   PRN Meds: acetaminophen, nitroGLYCERIN, ondansetron (ZOFRAN) IV  Allergies:    Allergies  Allergen Reactions   Lisinopril Cough    Social History:   Social History   Socioeconomic History   Marital status: Divorced    Spouse name: Not on file   Number of children: 0   Years of education: Not on file   Highest education level: Not on file  Occupational History   Occupation:  Disability   Occupation: Counsellor for [employer]  Tobacco Use   Smoking status: Every Day    Packs/day: 2.00    Years: 40.00    Pack years: 80.00    Types: Cigarettes   Smokeless tobacco: Never   Tobacco comments:    06/02/2021 reports down from 2 ppd/3-4 cigs/day   Vaping Use   Vaping Use: Never used  Substance and Sexual Activity   Alcohol use: No    Alcohol/week: 2.0 standard drinks    Types: 1 Glasses of wine, 1 Shots of liquor per week    Comment: Rarely   Drug use: No   Sexual activity: Not Currently  Other Topics Concern   Not on file  Social History Narrative   Not on file   Social Determinants of Health   Financial Resource Strain: Not on file  Food Insecurity: Not on file  Transportation Needs: Not on file  Physical Activity: Not on file  Stress: Not on file  Social Connections: Not on file  Intimate Partner Violence: Not on file    Family History:    Family History  Problem Relation Age of Onset   Hypertension Mother    Alzheimer's disease Mother    Colon cancer Father    Colon cancer Paternal Grandmother    Esophageal cancer Neg Hx    Rectal cancer Neg Hx    Stomach cancer Neg Hx      ROS:  Please see the history of present illness.   All other ROS reviewed and negative.     Physical Exam/Data:   Vitals:   01/07/22 0330 01/07/22 0712 01/07/22 0800 01/07/22 0922  BP: 102/67 131/84 132/74 109/81  Pulse: 81 70 97 86  Resp: 16 19 20    Temp:   98.1 F (36.7 C)   TempSrc:   Oral   SpO2: 98% 99% 99%   Weight:      Height:       No intake or output data in the 24 hours ending 01/07/22 1016 Last 3 Weights 01/07/2022 01/04/2022 09/05/2021  Weight (lbs) 275 lb 9.2 oz 259 lb 3.2 oz 259 lb 3.2 oz  Weight (kg) 125 kg 117.572 kg 117.572 kg     Body mass index is 34.44 kg/m.    General:  Well nourished, well developed, in no acute distress HEENT: normal Neck: no JVD Vascular: No carotid bruits; Distal pulses 2+ bilaterally Cardiac:  normal  S1, S2; irregularly irregular; no murmur  Lungs:  clear to auscultation bilaterally, no wheezing, rhonchi or rales  Abd: soft, nontender, no hepatomegaly  Ext: no edema Musculoskeletal:  No deformities, BUE and BLE  strength normal and equal Skin: warm and dry  Neuro:  CNs 2-12 intact, no focal abnormalities noted Psych:  Normal affect   EKG:  The EKG was personally reviewed and demonstrates:    01/07/2022 ECG shows VT, monomorphic with a left superior axis.    01/07/2022 ECG shows atrial fibrillation and inferior infarct pattern    Telemetry:  Telemetry was personally reviewed and demonstrates:   Wide complex tachycardia lasting 1minutes. Othertimes, atrial fibrillation with pvc.  Relevant CV Studies:  06/10/2021 Echo personally reviewed Inferior and inferolateral scar with thinning and akinesis. Anterior wall moves well. EF severely reduced.    Laboratory Data:  High Sensitivity Troponin:   Recent Labs  Lab 12/19/21 1953 12/19/21 2224 01/07/22 0156 01/07/22 0210  TROPONINIHS 35* 35* 93* 98*     Chemistry Recent Labs  Lab 01/04/22 1121 01/07/22 0156  NA 130* 132*  K 5.1 4.1  CL 91* 96*  CO2 30 23  GLUCOSE 107* 152*  BUN 26* 20  CREATININE 1.75* 2.00*  CALCIUM 9.5 9.1  MG 1.7 1.6*  GFRNONAA  --  36*  ANIONGAP  --  13    Recent Labs  Lab 01/04/22 1121 01/07/22 0156  PROT 6.5 6.5  ALBUMIN  --  3.6  AST 10 18  ALT 9 14  ALKPHOS  --  86  BILITOT 1.2 0.7   Lipids  Recent Labs  Lab 01/04/22 1121  CHOL 137  TRIG 216*  HDL 63  LDLCALC 46  CHOLHDL 2.2    Hematology Recent Labs  Lab 01/04/22 1121 01/07/22 0156  WBC 8.5 7.5  RBC 4.65 4.81  HGB 13.9 14.2  HCT 42.2 43.8  MCV 90.8 91.1  MCH 29.9 29.5  MCHC 32.9 32.4  RDW 13.8 14.0  PLT 318 318   Thyroid  Recent Labs  Lab 01/04/22 1121  TSH 2.01    BNPNo results for input(s): BNP, PROBNP in the last 168 hours.  DDimer No results for input(s): DDIMER in the last 168  hours.   Radiology/Studies:  DG Chest Portable 1 View  Result Date: 01/07/2022 CLINICAL DATA:  Chest pain EXAM: PORTABLE CHEST 1 VIEW COMPARISON:  12/22/2021 FINDINGS: The heart size and mediastinal contours are within normal limits. Coronary artery bypass grafting has been performed. Both lungs are clear; right perihilar pulmonary infiltrate has resolved. The visualized skeletal structures are unremarkable. IMPRESSION: Resolved perihilar pulmonary infiltrate. No radiographic evidence of acute cardiopulmonary disease. Electronically Signed   By: Fidela Salisbury M.D.   On: 01/07/2022 02:34     Assessment and Plan:   #Ventricular tachycardia Scar mediated. Continue amiodarone gtt for now. Will need to transition to oral in 24-48 hours. Repeat left heart cath to assess graft/native vessel patency  The patient has had a persistently reduced LV EF for several years despite revascularization with CABG and PCI. He has evidence of scar on his echo and monomorphic VT (scar mediated) at presentation to the ER. He will require an ICD for secondary prevention of sudden cardiac death. He will require a transvenous system. Await results of LHC before determining timing of ICD implant.    #AF w/ RVR - amio gtt - heparin gtt  #CAD s/p CABG and PCI in 0960  #Chronic systolic heart failure Repeat TTE this admission Cont home meds  For questions or updates, please contact [place] Please consult [email or web address] for contact info under    Signed, Vickie Epley, MD  01/07/2022 10:16 AM

## 2022-01-07 NOTE — ED Notes (Signed)
Pt had another episode of sustained Vtach lasting about 2 minutes. Pt reported feeling abdominal discomfort, feeling of tingling in bilat hands, denies CP, SOB. After about 2 minutes, pt spontaneously converted back to Afib. Secure chat sent to MD.

## 2022-01-07 NOTE — ED Provider Triage Note (Signed)
Emergency Medicine Provider Triage Evaluation Note  Curtis Perez , a 71 y.o. male  was evaluated in triage.  Patient has a history of hypertension, hyperlipidemia, MI status post CABG x3, CAD, atrial fibrillation, COPD, CHF.  Pt complains of chest and back pain.  He states yesterday around 11 AM he had an episode of right upper back pain that radiated forward into the right side of his chest.  States that this quickly resolved and then happened again about 10 minutes later.  This once again resolved.  He had no episodes yesterday but around midnight he began experiencing similar symptoms while lying in bed.  States they lasted for about 20 minutes before resolving once again.  Reports associated diaphoresis as well as nausea.  No vomiting.  States he has been compliant with his Eliquis.  Physical Exam  Ht 6\' 3"  (1.905 m)    Wt 125 kg    BMI 34.44 kg/m  Gen:   Awake, no distress   Resp:  Normal effort  MSK:   Moves extremities without difficulty  Other:    Medical Decision Making  Medically screening exam initiated at 1:52 AM.  Appropriate orders placed.  Curtis Perez was informed that the remainder of the evaluation will be completed by another provider, this initial triage assessment does not replace that evaluation, and the importance of remaining in the ED until their evaluation is complete.   Rayna Sexton, PA-C 01/07/22 0154

## 2022-01-08 LAB — CBC
HCT: 40.4 % (ref 39.0–52.0)
Hemoglobin: 13.6 g/dL (ref 13.0–17.0)
MCH: 30.3 pg (ref 26.0–34.0)
MCHC: 33.7 g/dL (ref 30.0–36.0)
MCV: 90 fL (ref 80.0–100.0)
Platelets: 251 10*3/uL (ref 150–400)
RBC: 4.49 MIL/uL (ref 4.22–5.81)
RDW: 13.9 % (ref 11.5–15.5)
WBC: 8.5 10*3/uL (ref 4.0–10.5)
nRBC: 0 % (ref 0.0–0.2)

## 2022-01-08 LAB — BASIC METABOLIC PANEL
Anion gap: 6 (ref 5–15)
BUN: 14 mg/dL (ref 8–23)
CO2: 23 mmol/L (ref 22–32)
Calcium: 8.4 mg/dL — ABNORMAL LOW (ref 8.9–10.3)
Chloride: 101 mmol/L (ref 98–111)
Creatinine, Ser: 1.59 mg/dL — ABNORMAL HIGH (ref 0.61–1.24)
GFR, Estimated: 47 mL/min — ABNORMAL LOW (ref >60)
Glucose, Bld: 96 mg/dL (ref 70–99)
Potassium: 4 mmol/L (ref 3.5–5.1)
Sodium: 130 mmol/L — ABNORMAL LOW (ref 135–145)

## 2022-01-08 LAB — APTT
aPTT: 114 seconds — ABNORMAL HIGH (ref 24–36)
aPTT: 75 seconds — ABNORMAL HIGH (ref 24–36)

## 2022-01-08 LAB — GLUCOSE, CAPILLARY
Glucose-Capillary: 127 mg/dL — ABNORMAL HIGH (ref 70–99)
Glucose-Capillary: 75 mg/dL (ref 70–99)
Glucose-Capillary: 200 mg/dL — ABNORMAL HIGH (ref 70–99)
Glucose-Capillary: 113 mg/dL — ABNORMAL HIGH (ref 70–99)

## 2022-01-08 LAB — HEPARIN LEVEL (UNFRACTIONATED): Heparin Unfractionated: >1.1 IU/mL — ABNORMAL HIGH (ref 0.30–0.70)

## 2022-01-08 MED ORDER — SODIUM CHLORIDE 0.9% FLUSH: 3.0000 mL | Freq: Two times a day (BID) | INTRAVENOUS | Status: DC

## 2022-01-08 MED ORDER — ASPIRIN 81 MG PO CHEW
81.0000 mg | CHEWABLE_TABLET | ORAL | Status: AC
  Administered 2022-01-09: 81 mg via ORAL
  Filled 2022-01-08: qty 1

## 2022-01-08 MED ORDER — AMIODARONE HCL IN DEXTROSE 360-4.14 MG/200ML-% IV SOLN
30.0000 mg/h | INTRAVENOUS | Status: DC
  Administered 2022-01-08 – 2022-01-09 (×3): 30 mg/h via INTRAVENOUS
  Filled 2022-01-08 (×3): qty 200

## 2022-01-08 MED ORDER — METOPROLOL SUCCINATE ER 50 MG PO TB24
75.0000 mg | ORAL_TABLET | Freq: Every day | ORAL | Status: DC
  Administered 2022-01-08 – 2022-01-11 (×4): 75 mg via ORAL
  Filled 2022-01-08 (×4): qty 1

## 2022-01-08 MED ORDER — SODIUM CHLORIDE 0.9% FLUSH: 3.0000 mL | INTRAVENOUS | Status: DC | PRN

## 2022-01-08 MED ORDER — AMIODARONE HCL IN DEXTROSE 360-4.14 MG/200ML-% IV SOLN
60.0000 mg/h | INTRAVENOUS | Status: AC
  Administered 2022-01-08: 60 mg/h via INTRAVENOUS
  Filled 2022-01-08: qty 200

## 2022-01-08 MED ORDER — ASPIRIN 81 MG PO CHEW
81.0000 mg | CHEWABLE_TABLET | Freq: Every day | ORAL | Status: DC
  Administered 2022-01-10 – 2022-01-11 (×2): 81 mg via ORAL
  Filled 2022-01-08 (×2): qty 1

## 2022-01-08 MED ORDER — SODIUM CHLORIDE 0.9 % WEIGHT BASED INFUSION
1.0000 mL/kg/h | INTRAVENOUS | Status: DC
  Administered 2022-01-09 (×2): 1 mL/kg/h via INTRAVENOUS

## 2022-01-08 MED ORDER — SODIUM CHLORIDE 0.9 % IV SOLN: 250.0000 mL | INTRAVENOUS | Status: DC | PRN

## 2022-01-08 NOTE — Plan of Care (Signed)

## 2022-01-08 NOTE — Progress Notes (Signed)
ANTICOAGULATION CONSULT NOTE - Follow Up Consult  Pharmacy Consult for Heparin Indication: chest pain/ACS and atrial fibrillation  Allergies  Allergen Reactions   Lisinopril Cough    Patient Measurements: Height: 6\' 3"  (190.5 cm) Weight: 118.5 kg (261 lb 3.9 oz) IBW/kg (Calculated) : 84.5 Heparin Dosing Weight: 111.4 kg  Vital Signs: Temp: 98 F (36.7 C) (01/22 0745) Temp Source: Oral (01/22 0745) BP: 115/67 (01/22 0946) Pulse Rate: 78 (01/22 0946)  Labs: Recent Labs    01/07/22 0156 01/07/22 0210 01/07/22 1535 01/08/22 0254 01/08/22 1124  HGB 14.2  --   --  13.6  --   HCT 43.8  --   --  40.4  --   PLT 318  --   --  251  --   APTT  --   --  39* 114* 75*  HEPARINUNFRC  --   --  >1.10* >1.10*  --   CREATININE 2.00*  --   --   --   --   TROPONINIHS 93* 98*  --   --   --     Estimated Creatinine Clearance: 49.1 mL/min (A) (by C-G formula based on SCr of 2 mg/dL (H)).   Medications:  Scheduled:   acyclovir  400 mg Oral TID   aspirin  81 mg Oral Daily   buPROPion  300 mg Oral Daily   ezetimibe  10 mg Oral Daily   [START ON 01/09/2022] ferrous sulfate  325 mg Oral Q M,W,F   FLUoxetine  40 mg Oral Daily   insulin aspart  0-20 Units Subcutaneous TID WC   insulin aspart  0-5 Units Subcutaneous QHS   isosorbide mononitrate  30 mg Oral Daily   levothyroxine  100 mcg Oral Q0600   metoprolol succinate  75 mg Oral Daily   omega-3 acid ethyl esters  2 g Oral Daily   pantoprazole  40 mg Oral Daily   potassium chloride SA  20 mEq Oral Daily   rosuvastatin  40 mg Oral Daily   sacubitril-valsartan  1 tablet Oral BID   Infusions:   amiodarone 60 mg/hr (01/08/22 1053)   amiodarone     heparin 1,850 Units/hr (01/08/22 0536)    Assessment: 71 yo M presenting with c/o "back pain" associated with diaphoresis and heart palpitations and that felt similar to prior CAD event, on Eliquis for Afib PTA (last dose 1/20 @ 2030), to transition to heparin during further w/u. Pharmacy  consulted for heparin dosing.   aPTT today is therapeutic at 75, on 1850 units/hr. Heparin level is not correlating yet. Hgb 13.6, plt 251. No line issues or signs/symptoms of bleeding noted per RN.  Goal of Therapy:  Heparin level 0.3-0.7 units/ml aPTT 66-102 seconds Monitor platelets by anticoagulation protocol: Yes   Plan:  Continue heparin gtt at 1850 units/hr. Check ~6 hr aPTT.  Daily CBC, heparin level, aPTT. Monitor for signs/symptoms of bleeding.   [name], PharmD PGY1 Pharmacy Resident Phone [phone] 01/08/2022 12:08 PM   Please check AMION for all [place] phone numbers After 10:00 PM, call [place] [phone]

## 2022-01-08 NOTE — H&P (View-Only) (Signed)
EP Progress Note  Patient Name: Curtis Perez Date of Encounter: 01/08/2022  [facility] Cardiologist: None  Subjective   2 episodes of regular tachycardia overnight. Morphology different than rhythm at presentation.  Inpatient Medications    Scheduled Meds:  acyclovir  400 mg Oral TID   aspirin  81 mg Oral Daily   buPROPion  300 mg Oral Daily   ezetimibe  10 mg Oral Daily   [START ON 01/09/2022] ferrous sulfate  325 mg Oral Q M,W,F   FLUoxetine  40 mg Oral Daily   insulin aspart  0-20 Units Subcutaneous TID WC   insulin aspart  0-5 Units Subcutaneous QHS   isosorbide mononitrate  30 mg Oral Daily   levothyroxine  100 mcg Oral Q0600   metoprolol succinate  75 mg Oral Daily   omega-3 acid ethyl esters  2 g Oral Daily   pantoprazole  40 mg Oral Daily   potassium chloride SA  20 mEq Oral Daily   rosuvastatin  40 mg Oral Daily   sacubitril-valsartan  1 tablet Oral BID   Continuous Infusions:  amiodarone     amiodarone     heparin 1,850 Units/hr (01/08/22 0536)   PRN Meds: acetaminophen, nitroGLYCERIN, ondansetron (ZOFRAN) IV   Vital Signs    Vitals:   01/07/22 2108 01/08/22 0008 01/08/22 0526 01/08/22 0745  BP: 103/66 106/76 138/89 131/82  Pulse: 67 75 73 74  Resp: 16 16 18 16   Temp: 98.3 F (36.8 C) 98.4 F (36.9 C) 97.9 F (36.6 C) 98 F (36.7 C)  TempSrc: Oral Oral Axillary Oral  SpO2: 96% 98% 96% 96%  Weight:   118.5 kg   Height:        Intake/Output Summary (Last 24 hours) at 01/08/2022 0923 Last data filed at 01/08/2022 0600 Gross per 24 hour  Intake 336.48 ml  Output --  Net 336.48 ml   Last 3 Weights 01/08/2022 01/07/2022 01/04/2022  Weight (lbs) 261 lb 3.9 oz 275 lb 9.2 oz 259 lb 3.2 oz  Weight (kg) 118.5 kg 125 kg 117.572 kg      Telemetry    Narrower regular tachycardia overnight at a rate of 168 bpm. This occurred twice.. Personally Reviewed  ECG    Personally Reviewed  Physical Exam   GEN: No acute distress.   Neck: No  JVD Cardiac: RRR, no murmurs, rubs, or gallops.  Respiratory: Clear to auscultation bilaterally. GI: Soft, nontender, non-distended  MS: No edema; No deformity. Neuro:  Nonfocal  Psych: Normal affect   Labs    High Sensitivity Troponin:   Recent Labs  Lab 12/19/21 1953 12/19/21 2224 01/07/22 0156 01/07/22 0210  TROPONINIHS 35* 35* 93* 98*     Chemistry Recent Labs  Lab 01/04/22 1121 01/07/22 0156  NA 130* 132*  K 5.1 4.1  CL 91* 96*  CO2 30 23  GLUCOSE 107* 152*  BUN 26* 20  CREATININE 1.75* 2.00*  CALCIUM 9.5 9.1  MG 1.7 1.6*  PROT 6.5 6.5  ALBUMIN  --  3.6  AST 10 18  ALT 9 14  ALKPHOS  --  86  BILITOT 1.2 0.7  GFRNONAA  --  36*  ANIONGAP  --  13    Lipids  Recent Labs  Lab 01/04/22 1121  CHOL 137  TRIG 216*  HDL 63  LDLCALC 46  CHOLHDL 2.2    Hematology Recent Labs  Lab 01/04/22 1121 01/07/22 0156 01/08/22 0254  WBC 8.5 7.5 8.5  RBC 4.65 4.81  4.49  HGB 13.9 14.2 13.6  HCT 42.2 43.8 40.4  MCV 90.8 91.1 90.0  MCH 29.9 29.5 30.3  MCHC 32.9 32.4 33.7  RDW 13.8 14.0 13.9  PLT 318 318 251   Thyroid  Recent Labs  Lab 01/04/22 1121  TSH 2.01    BNPNo results for input(s): BNP, PROBNP in the last 168 hours.  DDimer No results for input(s): DDIMER in the last 168 hours.   Radiology    DG Chest Portable 1 View  Result Date: 01/07/2022 CLINICAL DATA:  Chest pain EXAM: PORTABLE CHEST 1 VIEW COMPARISON:  12/22/2021 FINDINGS: The heart size and mediastinal contours are within normal limits. Coronary artery bypass grafting has been performed. Both lungs are clear; right perihilar pulmonary infiltrate has resolved. The visualized skeletal structures are unremarkable. IMPRESSION: Resolved perihilar pulmonary infiltrate. No radiographic evidence of acute cardiopulmonary disease. Electronically Signed   By: Fidela Salisbury M.D.   On: 01/07/2022 02:34    Cardiac Studies    Assessment & Plan    [phone] man with CAD s/p CABG and PCI with a persistently  reduced EF who presented with wide complex tachycardia.  #Ventricular tachycardia Scar mediated. Rate 185 bpm. Continue amiodarone gtt for now. Will need to transition to oral in 24-48 hours. Repeat left heart cath to assess graft/native vessel patency (if renal function allows)   The patient has had a persistently reduced LV EF for several years despite revascularization with CABG and PCI. He has evidence of scar on his echo and monomorphic VT (scar mediated) at presentation to the ER. He will require an ICD for secondary prevention of sudden cardiac death. He will require a transvenous system. Await results of LHC before determining timing of ICD implant.    He experienced another 2 episodes of tachycardia 01/07/2022 evening at a slower rate 160s that has a different appearance than the VT. Unclear if this is second VT or ? SVT. If this recurs, will need to consider EP study prior to ICD Implant.   #AF w/ RVR - amio gtt - heparin gtt   #CAD s/p CABG and PCI in 2018   #Chronic systolic heart failure Repeat TTE this admission Cont home meds  For questions or updates, please contact [place] Please consult [email or web address] for contact info under        Signed, Vickie Epley, MD  01/08/2022, 9:23 AM

## 2022-01-08 NOTE — Progress Notes (Addendum)
Electrophysiology Rounding Note  Patient Name: Curtis Perez Date of Encounter: 01/08/2022  Primary Cardiologist: None Electrophysiologist: Vickie Epley, MD   Subjective   The patient is doing well today.  At this time, the patient denies chest pain, shortness of breath, or any new concerns.  Inpatient Medications    Scheduled Meds:  acyclovir  400 mg Oral TID   [START ON 01/09/2022] aspirin  81 mg Oral Pre-Cath   [START ON 01/10/2022] aspirin  81 mg Oral Daily   buPROPion  300 mg Oral Daily   ezetimibe  10 mg Oral Daily   [START ON 01/09/2022] ferrous sulfate  325 mg Oral Q M,W,F   FLUoxetine  40 mg Oral Daily   insulin aspart  0-20 Units Subcutaneous TID WC   insulin aspart  0-5 Units Subcutaneous QHS   isosorbide mononitrate  30 mg Oral Daily   levothyroxine  100 mcg Oral Q0600   metoprolol succinate  75 mg Oral Daily   omega-3 acid ethyl esters  2 g Oral Daily   pantoprazole  40 mg Oral Daily   potassium chloride SA  20 mEq Oral Daily   rosuvastatin  40 mg Oral Daily   sacubitril-valsartan  1 tablet Oral BID   sodium chloride flush  3 mL Intravenous Q12H   Continuous Infusions:  sodium chloride     sodium chloride     amiodarone 30 mg/hr (01/08/22 1657)   heparin 1,850 Units/hr (01/08/22 1525)   PRN Meds: sodium chloride, acetaminophen, nitroGLYCERIN, ondansetron (ZOFRAN) IV, sodium chloride flush   Vital Signs    Vitals:   01/08/22 0745 01/08/22 0946 01/08/22 1445 01/08/22 2036  BP: 131/82 115/67 106/66 (!) 98/55  Pulse: 74 78 60 61  Resp: 16  16 17   Temp: 98 F (36.7 C)     TempSrc: Oral     SpO2: 96%  97% 95%  Weight:      Height:        Intake/Output Summary (Last 24 hours) at 01/08/2022 2159 Last data filed at 01/08/2022 1857 Gross per 24 hour  Intake 727.3 ml  Output --  Net 727.3 ml   Filed Weights   01/07/22 0148 01/08/22 0526  Weight: 125 kg 118.5 kg    Physical Exam    GEN- The patient is well appearing, alert and oriented x 3  today.   Head- normocephalic, atraumatic Eyes-  Sclera clear, conjunctiva pink Ears- hearing intact Oropharynx- clear Neck- supple Lungs- Clear to ausculation bilaterally, normal work of breathing Heart- Regular rate and rhythm, no murmurs, rubs or gallops GI- soft, NT, ND, + BS Extremities- no clubbing or cyanosis. No edema Skin- no rash or lesion Psych- euthymic mood, full affect Neuro- strength and sensation are intact  Labs    CBC Recent Labs    01/07/22 0156 01/08/22 0254  WBC 7.5 8.5  NEUTROABS 6.3  --   HGB 14.2 13.6  HCT 43.8 40.4  MCV 91.1 90.0  PLT 318 824   Basic Metabolic Panel Recent Labs    01/07/22 0156 01/08/22 1124  NA 132* 130*  K 4.1 4.0  CL 96* 101  CO2 23 23  GLUCOSE 152* 96  BUN 20 14  CREATININE 2.00* 1.59*  CALCIUM 9.1 8.4*  MG 1.6*  --    Liver Function Tests Recent Labs    01/07/22 0156  AST 18  ALT 14  ALKPHOS 86  BILITOT 0.7  PROT 6.5  ALBUMIN 3.6   No results for  input(s): LIPASE, AMYLASE in the last 72 hours. Cardiac Enzymes No results for input(s): CKTOTAL, CKMB, CKMBINDEX, TROPONINI in the last 72 hours.   Telemetry    AF with controlled rates 70-90s (personally reviewed)  Radiology    DG Chest Portable 1 View  Result Date: 01/07/2022 CLINICAL DATA:  Chest pain EXAM: PORTABLE CHEST 1 VIEW COMPARISON:  12/22/2021 FINDINGS: The heart size and mediastinal contours are within normal limits. Coronary artery bypass grafting has been performed. Both lungs are clear; right perihilar pulmonary infiltrate has resolved. The visualized skeletal structures are unremarkable. IMPRESSION: Resolved perihilar pulmonary infiltrate. No radiographic evidence of acute cardiopulmonary disease. Electronically Signed   By: Fidela Salisbury M.D.   On: 01/07/2022 02:34    Patient Profile     71yo man with CAD s/p CABG and PCI with a persistently reduced EF who presented with wide complex tachycardia.  Assessment & Plan    Ventricular  tachycardia Scar mediated. Rate 185 bpm. Continue amiodarone gtt with plans for transition to oral in 24-48 hours. Repeat LHC to assess graft/native vessel patency (if renal function allows). On board for today, though large number of add ons.   The patient has had a persistently reduced LV EF for several years despite revascularization with CABG and PCI. He has evidence of scar on his echo and monomorphic VT (scar mediated) at presentation to the ER. He will require an ICD for secondary prevention of sudden cardiac death. He will require a transvenous system. Await results of LHC before determining timing of ICD implant.    He experienced another 2 episodes of tachycardia 01/07/2022 evening at a slower rate 160s that has a different appearance than the VT. Unclear if this is second VT or ? SVT. If this recurs, will need to consider EP study prior to ICD Implant.   2. AF w/ RVR - amio gtt - heparin gtt   3. CAD s/p CABG and PCI in 2018   4. Chronic systolic heart failure Update TTE this admission. EF 30-35% 05/2021 Cont home meds   For questions or updates, please contact [place] Please consult [email or web address] for contact info under Cardiology/STEMI.  Signed, Shirley Friar, PA-C  01/08/2022, 9:59 PM

## 2022-01-08 NOTE — Progress Notes (Addendum)
Paged Dr. Gaynelle Arabian about pt having a 3 minute  run of SVT pt is asymptomatic at this time and went back into A.Fib with HR 75.   Dr. Gaynelle Arabian on unit to review pt's monitor strip. MD will notify oncoming MD about pt. Pt is stable at this time.

## 2022-01-08 NOTE — Progress Notes (Signed)
°  Transition of Care [facility]) Screening Note   Patient Details  Name: Curtis Perez Date of Birth: February 19, 1951   Transition of Care [facility]) CM/SW Contact:    Bary Castilla, LCSW Phone Number:[phone] 01/08/2022, 2:07 PM    Transition of Care Department [facility]) has reviewed patient and no TOC needs have been identified at this time. We will continue to monitor patient advancement through interdisciplinary progression rounds. If new patient transition needs arise, please place a TOC consult.

## 2022-01-08 NOTE — Progress Notes (Signed)
EP Progress Note  Patient Name: Curtis Perez Date of Encounter: 01/08/2022  [facility] Cardiologist: None  Subjective   2 episodes of regular tachycardia overnight. Morphology different than rhythm at presentation.  Inpatient Medications    Scheduled Meds:  acyclovir  400 mg Oral TID   aspirin  81 mg Oral Daily   buPROPion  300 mg Oral Daily   ezetimibe  10 mg Oral Daily   [START ON 01/09/2022] ferrous sulfate  325 mg Oral Q M,W,F   FLUoxetine  40 mg Oral Daily   insulin aspart  0-20 Units Subcutaneous TID WC   insulin aspart  0-5 Units Subcutaneous QHS   isosorbide mononitrate  30 mg Oral Daily   levothyroxine  100 mcg Oral Q0600   metoprolol succinate  75 mg Oral Daily   omega-3 acid ethyl esters  2 g Oral Daily   pantoprazole  40 mg Oral Daily   potassium chloride SA  20 mEq Oral Daily   rosuvastatin  40 mg Oral Daily   sacubitril-valsartan  1 tablet Oral BID   Continuous Infusions:  amiodarone     amiodarone     heparin 1,850 Units/hr (01/08/22 0536)   PRN Meds: acetaminophen, nitroGLYCERIN, ondansetron (ZOFRAN) IV   Vital Signs    Vitals:   01/07/22 2108 01/08/22 0008 01/08/22 0526 01/08/22 0745  BP: 103/66 106/76 138/89 131/82  Pulse: 67 75 73 74  Resp: 16 16 18 16   Temp: 98.3 F (36.8 C) 98.4 F (36.9 C) 97.9 F (36.6 C) 98 F (36.7 C)  TempSrc: Oral Oral Axillary Oral  SpO2: 96% 98% 96% 96%  Weight:   118.5 kg   Height:        Intake/Output Summary (Last 24 hours) at 01/08/2022 0923 Last data filed at 01/08/2022 0600 Gross per 24 hour  Intake 336.48 ml  Output --  Net 336.48 ml   Last 3 Weights 01/08/2022 01/07/2022 01/04/2022  Weight (lbs) 261 lb 3.9 oz 275 lb 9.2 oz 259 lb 3.2 oz  Weight (kg) 118.5 kg 125 kg 117.572 kg      Telemetry    Narrower regular tachycardia overnight at a rate of 168 bpm. This occurred twice.. Personally Reviewed  ECG    Personally Reviewed  Physical Exam   GEN: No acute distress.   Neck: No  JVD Cardiac: RRR, no murmurs, rubs, or gallops.  Respiratory: Clear to auscultation bilaterally. GI: Soft, nontender, non-distended  MS: No edema; No deformity. Neuro:  Nonfocal  Psych: Normal affect   Labs    High Sensitivity Troponin:   Recent Labs  Lab 12/19/21 1953 12/19/21 2224 01/07/22 0156 01/07/22 0210  TROPONINIHS 35* 35* 93* 98*     Chemistry Recent Labs  Lab 01/04/22 1121 01/07/22 0156  NA 130* 132*  K 5.1 4.1  CL 91* 96*  CO2 30 23  GLUCOSE 107* 152*  BUN 26* 20  CREATININE 1.75* 2.00*  CALCIUM 9.5 9.1  MG 1.7 1.6*  PROT 6.5 6.5  ALBUMIN  --  3.6  AST 10 18  ALT 9 14  ALKPHOS  --  86  BILITOT 1.2 0.7  GFRNONAA  --  36*  ANIONGAP  --  13    Lipids  Recent Labs  Lab 01/04/22 1121  CHOL 137  TRIG 216*  HDL 63  LDLCALC 46  CHOLHDL 2.2    Hematology Recent Labs  Lab 01/04/22 1121 01/07/22 0156 01/08/22 0254  WBC 8.5 7.5 8.5  RBC 4.65 4.81  4.49  HGB 13.9 14.2 13.6  HCT 42.2 43.8 40.4  MCV 90.8 91.1 90.0  MCH 29.9 29.5 30.3  MCHC 32.9 32.4 33.7  RDW 13.8 14.0 13.9  PLT 318 318 251   Thyroid  Recent Labs  Lab 01/04/22 1121  TSH 2.01    BNPNo results for input(s): BNP, PROBNP in the last 168 hours.  DDimer No results for input(s): DDIMER in the last 168 hours.   Radiology    DG Chest Portable 1 View  Result Date: 01/07/2022 CLINICAL DATA:  Chest pain EXAM: PORTABLE CHEST 1 VIEW COMPARISON:  12/22/2021 FINDINGS: The heart size and mediastinal contours are within normal limits. Coronary artery bypass grafting has been performed. Both lungs are clear; right perihilar pulmonary infiltrate has resolved. The visualized skeletal structures are unremarkable. IMPRESSION: Resolved perihilar pulmonary infiltrate. No radiographic evidence of acute cardiopulmonary disease. Electronically Signed   By: Fidela Salisbury M.D.   On: 01/07/2022 02:34    Cardiac Studies    Assessment & Plan    [phone] man with CAD s/p CABG and PCI with a persistently  reduced EF who presented with wide complex tachycardia.  #Ventricular tachycardia Scar mediated. Rate 185 bpm. Continue amiodarone gtt for now. Will need to transition to oral in 24-48 hours. Repeat left heart cath to assess graft/native vessel patency (if renal function allows)   The patient has had a persistently reduced LV EF for several years despite revascularization with CABG and PCI. He has evidence of scar on his echo and monomorphic VT (scar mediated) at presentation to the ER. He will require an ICD for secondary prevention of sudden cardiac death. He will require a transvenous system. Await results of LHC before determining timing of ICD implant.    He experienced another 2 episodes of tachycardia 01/07/2022 evening at a slower rate 160s that has a different appearance than the VT. Unclear if this is second VT or ? SVT. If this recurs, will need to consider EP study prior to ICD Implant.   #AF w/ RVR - amio gtt - heparin gtt   #CAD s/p CABG and PCI in 2018   #Chronic systolic heart failure Repeat TTE this admission Cont home meds  For questions or updates, please contact [place] Please consult [email or web address] for contact info under        Signed, Vickie Epley, MD  01/08/2022, 9:23 AM

## 2022-01-08 NOTE — Plan of Care (Signed)
°  Problem: Education: Goal: Knowledge of General Education information will improve Description: Including pain rating scale, medication(s)/side effects and non-pharmacologic comfort measures 01/08/2022 1251 by Drucie Ip I, RN Outcome: Progressing 01/08/2022 1243 by Drucie Ip I, RN Outcome: Progressing   Problem: Health Behavior/Discharge Planning: Goal: Ability to manage health-related needs will improve 01/08/2022 1251 by Drucie Ip I, RN Outcome: Progressing 01/08/2022 1243 by Drucie Ip I, RN Outcome: Progressing   Problem: Clinical Measurements: Goal: Ability to maintain clinical measurements within normal limits will improve 01/08/2022 1251 by Drucie Ip I, RN Outcome: Progressing 01/08/2022 1243 by Drucie Ip I, RN Outcome: Progressing Goal: Will remain free from infection 01/08/2022 1251 by Drucie Ip I, RN Outcome: Progressing 01/08/2022 1243 by Drucie Ip I, RN Outcome: Progressing Goal: Diagnostic test results will improve 01/08/2022 1251 by Drucie Ip I, RN Outcome: Progressing 01/08/2022 1243 by Drucie Ip I, RN Outcome: Progressing Goal: Respiratory complications will improve 01/08/2022 1251 by Drucie Ip I, RN Outcome: Progressing 01/08/2022 1243 by Drucie Ip I, RN Outcome: Progressing Goal: Cardiovascular complication will be avoided 01/08/2022 1251 by Drucie Ip I, RN Outcome: Progressing 01/08/2022 1243 by Drucie Ip I, RN Outcome: Progressing   Problem: Activity: Goal: Risk for activity intolerance will decrease 01/08/2022 1251 by Drucie Ip I, RN Outcome: Progressing 01/08/2022 1243 by Drucie Ip I, RN Outcome: Progressing   Problem: Nutrition: Goal: Adequate nutrition will be maintained 01/08/2022 1251 by Drucie Ip I, RN Outcome: Progressing 01/08/2022 1243 by Drucie Ip I, RN Outcome: Progressing   Problem: Coping: Goal: Level  of anxiety will decrease 01/08/2022 1251 by Drucie Ip I, RN Outcome: Progressing 01/08/2022 1243 by Drucie Ip I, RN Outcome: Progressing   Problem: Elimination: Goal: Will not experience complications related to bowel motility 01/08/2022 1251 by Drucie Ip I, RN Outcome: Progressing 01/08/2022 1243 by Drucie Ip I, RN Outcome: Progressing Goal: Will not experience complications related to urinary retention 01/08/2022 1251 by Drucie Ip I, RN Outcome: Progressing 01/08/2022 1243 by Drucie Ip I, RN Outcome: Progressing   Problem: Pain Managment: Goal: General experience of comfort will improve 01/08/2022 1251 by Drucie Ip I, RN Outcome: Progressing 01/08/2022 1243 by Drucie Ip I, RN Outcome: Progressing   Problem: Safety: Goal: Ability to remain free from injury will improve 01/08/2022 1251 by Drucie Ip I, RN Outcome: Progressing 01/08/2022 1243 by Drucie Ip I, RN Outcome: Progressing   Problem: Skin Integrity: Goal: Risk for impaired skin integrity will decrease 01/08/2022 1251 by Drucie Ip I, RN Outcome: Progressing 01/08/2022 1243 by Drucie Ip I, RN Outcome: Progressing

## 2022-01-08 NOTE — Progress Notes (Signed)
ANTICOAGULATION CONSULT NOTE   Pharmacy Consult for heparin Indication:  CP/Afib  Allergies  Allergen Reactions   Lisinopril Cough    Patient Measurements: Height: 6\' 3"  (190.5 cm) Weight: 125 kg (275 lb 9.2 oz) IBW/kg (Calculated) : 84.5 Heparin Dosing Weight: 110kg  Vital Signs: Temp: 98.4 F (36.9 C) (01/22 0008) Temp Source: Oral (01/22 0008) BP: 106/76 (01/22 0008) Pulse Rate: 75 (01/22 0008)  Labs: Recent Labs    01/07/22 0156 01/07/22 0210 01/07/22 1535 01/08/22 0254  HGB 14.2  --   --  13.6  HCT 43.8  --   --  40.4  PLT 318  --   --  251  APTT  --   --  39* 114*  HEPARINUNFRC  --   --  >1.10* >1.10*  CREATININE 2.00*  --   --   --   TROPONINIHS 93* 98*  --   --      Estimated Creatinine Clearance: 50.4 mL/min (A) (by C-G formula based on SCr of 2 mg/dL (H)).   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 Plans for cath then ICD for ventricular tachycardia  aPTT is now supratherapeutic after receiving a heparin bolus and increasing drip rate to 1950  units/hr. H/H stable, Plt wnl. RN reports no s/s of bleeding   Goal of Therapy:  Heparin level 0.3-0.7 units/ml aPTT 66-102 seconds Monitor platelets by anticoagulation protocol: Yes   Plan:  -Decrease heparin to 1850 units/hr -aPTT in 6 hours  -heparin level, aPTT and CBC in am  Albertina Parr, PharmD., BCPS, BCCCP Clinical Pharmacist Please refer to [facility] for unit-specific pharmacist

## 2022-01-09 ENCOUNTER — Inpatient Hospital Stay (HOSPITAL_COMMUNITY)
Admission: EM | Disposition: A | Discharge status: Home / Self Care | Payer: Self-pay | Source: Home / Self Care | Attending: Cardiology

## 2022-01-09 ENCOUNTER — Other Ambulatory Visit: Payer: Self-pay | Admitting: Nurse Practitioner

## 2022-01-09 DIAGNOSIS — T82855A Stenosis of coronary artery stent, initial encounter: Secondary | ICD-10-CM

## 2022-01-09 DIAGNOSIS — I2510 Atherosclerotic heart disease of native coronary artery without angina pectoris: Secondary | ICD-10-CM

## 2022-01-09 DIAGNOSIS — E785 Hyperlipidemia, unspecified: Secondary | ICD-10-CM

## 2022-01-09 HISTORY — PX: CORONARY STENT INTERVENTION: CATH118234

## 2022-01-09 HISTORY — PX: LEFT HEART CATH AND CORS/GRAFTS ANGIOGRAPHY: CATH118250

## 2022-01-09 LAB — CBC
HCT: 39 % (ref 39.0–52.0)
Hemoglobin: 13 g/dL (ref 13.0–17.0)
MCH: 29.9 pg (ref 26.0–34.0)
MCHC: 33.3 g/dL (ref 30.0–36.0)
MCV: 89.7 fL (ref 80.0–100.0)
Platelets: 215 10*3/uL (ref 150–400)
RBC: 4.35 MIL/uL (ref 4.22–5.81)
RDW: 14 % (ref 11.5–15.5)
WBC: 7.3 10*3/uL (ref 4.0–10.5)
nRBC: 0 % (ref 0.0–0.2)

## 2022-01-09 LAB — BASIC METABOLIC PANEL
Anion gap: 6 (ref 5–15)
BUN: 14 mg/dL (ref 8–23)
CO2: 23 mmol/L (ref 22–32)
Calcium: 8.2 mg/dL — ABNORMAL LOW (ref 8.9–10.3)
Chloride: 101 mmol/L (ref 98–111)
Creatinine, Ser: 1.56 mg/dL — ABNORMAL HIGH (ref 0.61–1.24)
GFR, Estimated: 48 mL/min — ABNORMAL LOW (ref >60)
Glucose, Bld: 122 mg/dL — ABNORMAL HIGH (ref 70–99)
Potassium: 5 mmol/L (ref 3.5–5.1)
Sodium: 130 mmol/L — ABNORMAL LOW (ref 135–145)

## 2022-01-09 LAB — POCT ACTIVATED CLOTTING TIME
Activated Clotting Time: 275 seconds
Activated Clotting Time: 353 seconds

## 2022-01-09 LAB — GLUCOSE, CAPILLARY
Glucose-Capillary: 112 mg/dL — ABNORMAL HIGH (ref 70–99)
Glucose-Capillary: 117 mg/dL — ABNORMAL HIGH (ref 70–99)
Glucose-Capillary: 109 mg/dL — ABNORMAL HIGH (ref 70–99)
Glucose-Capillary: 110 mg/dL — ABNORMAL HIGH (ref 70–99)

## 2022-01-09 LAB — APTT: aPTT: 91 seconds — ABNORMAL HIGH (ref 24–36)

## 2022-01-09 LAB — HEPARIN LEVEL (UNFRACTIONATED): Heparin Unfractionated: 0.75 IU/mL — ABNORMAL HIGH (ref 0.30–0.70)

## 2022-01-09 SURGERY — LEFT HEART CATH AND CORS/GRAFTS ANGIOGRAPHY
Anesthesia: LOCAL

## 2022-01-09 MED ORDER — MORPHINE SULFATE (PF) 2 MG/ML IV SOLN: 2.0000 mg | INTRAVENOUS | Status: DC | PRN

## 2022-01-09 MED ORDER — FAMOTIDINE IN NACL 20-0.9 MG/50ML-% IV SOLN
INTRAVENOUS | Status: AC
  Filled 2022-01-09: qty 50

## 2022-01-09 MED ORDER — FENTANYL CITRATE (PF) 100 MCG/2ML IJ SOLN
INTRAMUSCULAR | Status: AC
  Filled 2022-01-09: qty 2

## 2022-01-09 MED ORDER — SODIUM CHLORIDE 0.9 % IV SOLN: 250.0000 mL | INTRAVENOUS | Status: DC | PRN

## 2022-01-09 MED ORDER — HEPARIN SODIUM (PORCINE) 1000 UNIT/ML IJ SOLN
INTRAMUSCULAR | Status: AC
  Filled 2022-01-09: qty 10

## 2022-01-09 MED ORDER — HEPARIN (PORCINE) IN NACL 1000-0.9 UT/500ML-% IV SOLN
INTRAVENOUS | Status: DC | PRN
  Administered 2022-01-09 (×2): 500 mL

## 2022-01-09 MED ORDER — HEPARIN (PORCINE) IN NACL 1000-0.9 UT/500ML-% IV SOLN
INTRAVENOUS | Status: AC
  Filled 2022-01-09: qty 1000

## 2022-01-09 MED ORDER — SODIUM CHLORIDE 0.9% FLUSH
3.0000 mL | Freq: Two times a day (BID) | INTRAVENOUS | Status: DC
  Administered 2022-01-10 (×2): 3 mL via INTRAVENOUS

## 2022-01-09 MED ORDER — IOHEXOL 350 MG/ML SOLN
INTRAVENOUS | Status: DC | PRN
  Administered 2022-01-09: 110 mL

## 2022-01-09 MED ORDER — LIDOCAINE HCL (PF) 1 % IJ SOLN
INTRAMUSCULAR | Status: AC
  Filled 2022-01-09: qty 30

## 2022-01-09 MED ORDER — FENTANYL CITRATE (PF) 100 MCG/2ML IJ SOLN
INTRAMUSCULAR | Status: DC | PRN
  Administered 2022-01-09: 25 ug via INTRAVENOUS

## 2022-01-09 MED ORDER — ATORVASTATIN CALCIUM 80 MG PO TABS
80.0000 mg | ORAL_TABLET | Freq: Every day | ORAL | Status: DC
  Administered 2022-01-09 – 2022-01-11 (×3): 80 mg via ORAL
  Filled 2022-01-09 (×3): qty 1

## 2022-01-09 MED ORDER — AMIODARONE HCL 200 MG PO TABS
400.0000 mg | ORAL_TABLET | Freq: Every day | ORAL | Status: DC
  Administered 2022-01-09 – 2022-01-11 (×3): 400 mg via ORAL
  Filled 2022-01-09 (×3): qty 2

## 2022-01-09 MED ORDER — ATROPINE SULFATE 1 MG/10ML IJ SOSY
PREFILLED_SYRINGE | INTRAMUSCULAR | Status: AC
  Filled 2022-01-09: qty 10

## 2022-01-09 MED ORDER — AMIODARONE HCL 200 MG PO TABS: 200.0000 mg | ORAL_TABLET | Freq: Every day | ORAL | Status: DC

## 2022-01-09 MED ORDER — CLOPIDOGREL BISULFATE 300 MG PO TABS
ORAL_TABLET | ORAL | Status: DC | PRN
  Administered 2022-01-09: 600 mg via ORAL

## 2022-01-09 MED ORDER — MIDAZOLAM HCL 2 MG/2ML IJ SOLN
INTRAMUSCULAR | Status: DC | PRN
  Administered 2022-01-09: 1 mg via INTRAVENOUS

## 2022-01-09 MED ORDER — COVID-19MRNA BIVAL VACC PFIZER 30 MCG/0.3ML IM SUSP
0.3000 mL | Freq: Once | INTRAMUSCULAR | Status: AC
  Administered 2022-01-10: 15:00:00 0.3 mL via INTRAMUSCULAR
  Filled 2022-01-09: qty 0.3

## 2022-01-09 MED ORDER — AMIODARONE HCL 200 MG PO TABS: 400.0000 mg | ORAL_TABLET | Freq: Every day | ORAL | Status: DC

## 2022-01-09 MED ORDER — MIDAZOLAM HCL 2 MG/2ML IJ SOLN
INTRAMUSCULAR | Status: AC
  Filled 2022-01-09: qty 2

## 2022-01-09 MED ORDER — HEPARIN SODIUM (PORCINE) 1000 UNIT/ML IJ SOLN
INTRAMUSCULAR | Status: DC | PRN
  Administered 2022-01-09: 11000 [IU] via INTRAVENOUS
  Administered 2022-01-09: 2500 [IU] via INTRAVENOUS

## 2022-01-09 MED ORDER — LABETALOL HCL 5 MG/ML IV SOLN: 10.0000 mg | INTRAVENOUS | Status: AC | PRN

## 2022-01-09 MED ORDER — SODIUM CHLORIDE 0.9% FLUSH: 3.0000 mL | INTRAVENOUS | Status: DC | PRN

## 2022-01-09 MED ORDER — CLOPIDOGREL BISULFATE 75 MG PO TABS: 75.0000 mg | ORAL_TABLET | Freq: Every day | ORAL | Status: DC

## 2022-01-09 MED ORDER — SODIUM CHLORIDE 0.9 % IV SOLN: INTRAVENOUS | Status: AC

## 2022-01-09 MED ORDER — HYDRALAZINE HCL 20 MG/ML IJ SOLN: 10.0000 mg | INTRAMUSCULAR | Status: AC | PRN

## 2022-01-09 MED ORDER — LIDOCAINE HCL (PF) 1 % IJ SOLN
INTRAMUSCULAR | Status: DC | PRN
  Administered 2022-01-09: 30 mL

## 2022-01-09 MED ORDER — CLOPIDOGREL BISULFATE 75 MG PO TABS
75.0000 mg | ORAL_TABLET | Freq: Every day | ORAL | Status: DC
  Administered 2022-01-10 – 2022-01-11 (×2): 75 mg via ORAL
  Filled 2022-01-09 (×2): qty 1

## 2022-01-09 MED ORDER — FAMOTIDINE IN NACL 20-0.9 MG/50ML-% IV SOLN
INTRAVENOUS | Status: AC | PRN
  Administered 2022-01-09: 20 mg via INTRAVENOUS

## 2022-01-09 MED ORDER — CLOPIDOGREL BISULFATE 300 MG PO TABS
ORAL_TABLET | ORAL | Status: AC
  Filled 2022-01-09: qty 2

## 2022-01-09 MED ORDER — NITROGLYCERIN 1 MG/10 ML FOR IR/CATH LAB
INTRA_ARTERIAL | Status: AC
  Filled 2022-01-09: qty 10

## 2022-01-09 SURGICAL SUPPLY — 20 items
BALLN EUPHORA RX 3.75X12 (BALLOONS) ×3
BALLN SAPPHIRE 2.5X12 (BALLOONS) ×3
BALLN SAPPHIRE ~~LOC~~ 2.5X12 (BALLOONS) ×2 IMPLANT
BALLOON EUPHORA RX 3.75X12 (BALLOONS) IMPLANT
BALLOON SAPPHIRE 2.5X12 (BALLOONS) IMPLANT
CATH INFINITI 5FR MULTPACK ANG (CATHETERS) ×2 IMPLANT
CATH VISTA GUIDE 6FR XB3.5 (CATHETERS) ×2 IMPLANT
GLIDESHEATH SLEND A-KIT 6F 22G (SHEATH) IMPLANT
KIT ENCORE 26 ADVANTAGE (KITS) ×2 IMPLANT
KIT HEART LEFT (KITS) ×3 IMPLANT
PACK CARDIAC CATHETERIZATION (CUSTOM PROCEDURE TRAY) ×3 IMPLANT
SHEATH PINNACLE 5F 10CM (SHEATH) ×2 IMPLANT
SHEATH PINNACLE 6F 10CM (SHEATH) ×2 IMPLANT
STENT ONYX FRONTIER 3.5X12 (Permanent Stent) ×2 IMPLANT
SYR MEDRAD MARK 7 150ML (SYRINGE) ×3 IMPLANT
TRANSDUCER W/STOPCOCK (MISCELLANEOUS) ×3 IMPLANT
TUBING CIL FLEX 10 FLL-RA (TUBING) ×1 IMPLANT
WIRE ASAHI PROWATER 180CM (WIRE) ×4 IMPLANT
WIRE EMERALD 3MM-J .035X150CM (WIRE) ×2 IMPLANT
WIRE HI TORQ VERSACORE-J 145CM (WIRE) ×2 IMPLANT

## 2022-01-09 NOTE — Progress Notes (Signed)
ANTICOAGULATION CONSULT NOTE - Follow Up Consult  Pharmacy Consult for Heparin Indication: chest pain/ACS and atrial fibrillation  Allergies  Allergen Reactions   Lisinopril Cough    Patient Measurements: Height: 6\' 3"  (190.5 cm) Weight: 110.1 kg (242 lb 11.2 oz) IBW/kg (Calculated) : 84.5 Heparin Dosing Weight: 111.4 kg  Vital Signs: BP: 124/72 (01/23 0831) Pulse Rate: 59 (01/23 0831)  Labs: Recent Labs    01/07/22 0156 01/07/22 0156 01/07/22 0210 01/07/22 1535 01/08/22 0254 01/08/22 1124 01/09/22 0327  HGB 14.2  --   --   --  13.6  --  13.0  HCT 43.8  --   --   --  40.4  --  39.0  PLT 318  --   --   --  251  --  215  APTT  --    < >  --  39* 114* 75* 91*  HEPARINUNFRC  --   --   --  >1.10* >1.10*  --  0.75*  CREATININE 2.00*  --   --   --   --  1.59* 1.56*  TROPONINIHS 93*  --  98*  --   --   --   --    < > = values in this interval not displayed.     Estimated Creatinine Clearance: 60.7 mL/min (A) (by C-G formula based on SCr of 1.56 mg/dL (H)).   Medications:  Scheduled:   acyclovir  400 mg Oral TID   [START ON 01/10/2022] aspirin  81 mg Oral Daily   buPROPion  300 mg Oral Daily   [START ON 01/10/2022] COVID-19 mRNA bivalent vaccine (Pfizer)  0.3 mL Intramuscular Once   ezetimibe  10 mg Oral Daily   ferrous sulfate  325 mg Oral Q M,W,F   FLUoxetine  40 mg Oral Daily   insulin aspart  0-20 Units Subcutaneous TID WC   insulin aspart  0-5 Units Subcutaneous QHS   isosorbide mononitrate  30 mg Oral Daily   levothyroxine  100 mcg Oral Q0600   metoprolol succinate  75 mg Oral Daily   omega-3 acid ethyl esters  2 g Oral Daily   pantoprazole  40 mg Oral Daily   potassium chloride SA  20 mEq Oral Daily   rosuvastatin  40 mg Oral Daily   sacubitril-valsartan  1 tablet Oral BID   sodium chloride flush  3 mL Intravenous Q12H   Infusions:   sodium chloride     sodium chloride 1 mL/kg/hr (01/09/22 1236)   amiodarone 30 mg/hr (01/09/22 0402)   heparin 1,850  Units/hr (01/09/22 0740)    Assessment: 71 yo M presenting with c/o "back pain" associated with diaphoresis and heart palpitations and that felt similar to prior CAD event, on Eliquis for Afib PTA (last dose 1/20 @ 2030), to transition to heparin during further w/u. Pharmacy consulted for heparin dosing.   aPTT today is therapeutic at 91, on 1850 units/hr. Heparin level is not correlating yet at 0.75. Hgb 13, plt 215. No line issues or signs/symptoms of bleeding noted per RN.  Goal of Therapy:  Heparin level 0.3-0.7 units/ml aPTT 66-102 seconds Monitor platelets by anticoagulation protocol: Yes   Plan:  Continue heparin gtt at 1850 units/hr. Daily CBC, heparin level, aPTT. Monitor for signs/symptoms of bleeding.   Erin Hearing PharmD., BCPS Clinical Pharmacist 01/09/2022 2:41 PM

## 2022-01-09 NOTE — Interval H&P Note (Signed)
Cath Lab Visit (complete for each Cath Lab visit)  Clinical Evaluation Leading to the Procedure:   ACS: No.  Non-ACS:    Anginal Classification: No Symptoms  Anti-ischemic medical therapy: Maximal Therapy (2 or more classes of medications)  Non-Invasive Test Results: No non-invasive testing performed  Prior CABG: Previous CABG      History and Physical Interval Note:  01/09/2022 3:41 PM  Curtis Perez  has presented today for surgery, with the diagnosis of unstable angina.  The various methods of treatment have been discussed with the patient and family. After consideration of risks, benefits and other options for treatment, the patient has consented to  Procedure(s): LEFT HEART CATH AND CORS/GRAFTS ANGIOGRAPHY (N/A) as a surgical intervention.  The patient's history has been reviewed, patient examined, no change in status, stable for surgery.  I have reviewed the patient's chart and labs.  Questions were answered to the patient's satisfaction.     Quay Burow

## 2022-01-10 ENCOUNTER — Inpatient Hospital Stay (HOSPITAL_COMMUNITY): Payer: PPO

## 2022-01-10 ENCOUNTER — Encounter (HOSPITAL_COMMUNITY): Payer: Self-pay | Admitting: Cardiovascular Disease

## 2022-01-10 DIAGNOSIS — R9431 Abnormal electrocardiogram [ECG] [EKG]: Secondary | ICD-10-CM

## 2022-01-10 DIAGNOSIS — I472 Ventricular tachycardia, unspecified: Secondary | ICD-10-CM

## 2022-01-10 LAB — ECHOCARDIOGRAM COMPLETE
Area-P 1/2: 3.63 cm2
Calc EF: 44.5 %
Height: 75 in
MV M vel: 4.07 m/s
MV Peak grad: 66.3 mmHg
Radius: 0.9 cm
S' Lateral: 6.4 cm
Single Plane A2C EF: 45 %
Single Plane A4C EF: 46.3 %
Weight: 3883.2 oz

## 2022-01-10 LAB — CBC
HCT: 36.2 % — ABNORMAL LOW (ref 39.0–52.0)
Hemoglobin: 12.2 g/dL — ABNORMAL LOW (ref 13.0–17.0)
MCH: 30.5 pg (ref 26.0–34.0)
MCHC: 33.7 g/dL (ref 30.0–36.0)
MCV: 90.5 fL (ref 80.0–100.0)
Platelets: 239 10*3/uL (ref 150–400)
RBC: 4 MIL/uL — ABNORMAL LOW (ref 4.22–5.81)
RDW: 14.1 % (ref 11.5–15.5)
WBC: 8.1 10*3/uL (ref 4.0–10.5)
nRBC: 0 % (ref 0.0–0.2)

## 2022-01-10 LAB — GLUCOSE, CAPILLARY
Glucose-Capillary: 136 mg/dL — ABNORMAL HIGH (ref 70–99)
Glucose-Capillary: 105 mg/dL — ABNORMAL HIGH (ref 70–99)
Glucose-Capillary: 143 mg/dL — ABNORMAL HIGH (ref 70–99)
Glucose-Capillary: 134 mg/dL — ABNORMAL HIGH (ref 70–99)
Glucose-Capillary: 139 mg/dL — ABNORMAL HIGH (ref 70–99)

## 2022-01-10 LAB — BASIC METABOLIC PANEL
Anion gap: 6 (ref 5–15)
BUN: 13 mg/dL (ref 8–23)
CO2: 25 mmol/L (ref 22–32)
Calcium: 8.5 mg/dL — ABNORMAL LOW (ref 8.9–10.3)
Chloride: 98 mmol/L (ref 98–111)
Creatinine, Ser: 1.65 mg/dL — ABNORMAL HIGH (ref 0.61–1.24)
GFR, Estimated: 45 mL/min — ABNORMAL LOW (ref >60)
Glucose, Bld: 139 mg/dL — ABNORMAL HIGH (ref 70–99)
Potassium: 4.8 mmol/L (ref 3.5–5.1)
Sodium: 129 mmol/L — ABNORMAL LOW (ref 135–145)

## 2022-01-10 LAB — HEPATIC FUNCTION PANEL
ALT: 10 U/L (ref 0–44)
AST: 12 U/L — ABNORMAL LOW (ref 15–41)
Albumin: 2.8 g/dL — ABNORMAL LOW (ref 3.5–5.0)
Alkaline Phosphatase: 67 U/L (ref 38–126)
Bilirubin, Direct: 0.2 mg/dL (ref 0.0–0.2)
Indirect Bilirubin: 0.6 mg/dL (ref 0.3–0.9)
Total Bilirubin: 0.8 mg/dL (ref 0.3–1.2)
Total Protein: 5.4 g/dL — ABNORMAL LOW (ref 6.5–8.1)

## 2022-01-10 LAB — POCT ACTIVATED CLOTTING TIME: Activated Clotting Time: 137 seconds

## 2022-01-10 LAB — HEPARIN LEVEL (UNFRACTIONATED): Heparin Unfractionated: 0.11 IU/mL — ABNORMAL LOW (ref 0.30–0.70)

## 2022-01-10 LAB — APTT: aPTT: 33 seconds (ref 24–36)

## 2022-01-10 MED ORDER — PERFLUTREN LIPID MICROSPHERE
1.0000 mL | INTRAVENOUS | Status: AC | PRN
  Administered 2022-01-10: 15:00:00 2 mL via INTRAVENOUS
  Filled 2022-01-10: qty 10

## 2022-01-10 MED ORDER — APIXABAN 5 MG PO TABS
5.0000 mg | ORAL_TABLET | Freq: Two times a day (BID) | ORAL | Status: DC
  Administered 2022-01-10 – 2022-01-11 (×3): 5 mg via ORAL
  Filled 2022-01-10 (×3): qty 1

## 2022-01-10 MED FILL — Nitroglycerin IV Soln 100 MCG/ML in D5W: INTRA_ARTERIAL | Qty: 10 | Status: AC

## 2022-01-10 NOTE — Progress Notes (Signed)
°  Echocardiogram 2D Echocardiogram has been performed.  Bobbye Charleston 01/10/2022, 2:38 PM

## 2022-01-10 NOTE — Care Management (Addendum)
01-10-22 9810 Life Vest Order and clinicals submitted to [place] for insurance approval. Case Manager will continue to follow for additional transition of care needs.   1605 01-10-22 Echo results submitted to [place] for review. No further needs from Case Manager at this time.

## 2022-01-10 NOTE — Progress Notes (Signed)
CARDIAC REHAB PHASE I   Offered to walk with pt. Pt declines, states he will walk when he gets home. Pt educated on importance of ASA and Plavix. Pt given heart healthy and diabetic diet. Pt declines smoking cessation at this time. Reviewed site care, restrictions, and exercise guidelines. Referred to [facility] [place] to meet the requirements, but pt not interested in attending.  [phone] Rufina Falco, RN BSN 01/10/2022 9:33 AM

## 2022-01-10 NOTE — Progress Notes (Signed)
Pt seen with Dr. Quentin Ore  [facility] 1/23 with PCI and baloon angioplasty of OM.  He will need to be on "triple therapy" for 1 month at which point ASA can be discontinued.   We will plan d/c on lifevest with cMRI in 6-8 weeks, then ICD planning to follow.   Lifevest ordered. Full note pending disposition. Potentially home today if can arrange.   Legrand Como [address] [place], PA-C  01/10/2022 9:41 AM

## 2022-01-10 NOTE — Progress Notes (Addendum)
Electrophysiology Rounding Note  Patient Name: Curtis Perez Date of Encounter: 01/10/2022  Primary Cardiologist: None Electrophysiologist: Vickie Epley, MD   Subjective   The patient is doing well today.  At this time, the patient denies chest pain, shortness of breath, or any new concerns.  Inpatient Medications    Scheduled Meds:  acyclovir  400 mg Oral TID   amiodarone  400 mg Oral Daily   Followed by   Derrill Memo ON 01/16/2022] amiodarone  200 mg Oral Daily   apixaban  5 mg Oral BID   aspirin  81 mg Oral Daily   atorvastatin  80 mg Oral Daily   buPROPion  300 mg Oral Daily   clopidogrel  75 mg Oral Q breakfast   COVID-19 mRNA bivalent vaccine (Pfizer)  0.3 mL Intramuscular Once   ezetimibe  10 mg Oral Daily   ferrous sulfate  325 mg Oral Q M,W,F   FLUoxetine  40 mg Oral Daily   insulin aspart  0-20 Units Subcutaneous TID WC   insulin aspart  0-5 Units Subcutaneous QHS   isosorbide mononitrate  30 mg Oral Daily   levothyroxine  100 mcg Oral Q0600   metoprolol succinate  75 mg Oral Daily   omega-3 acid ethyl esters  2 g Oral Daily   pantoprazole  40 mg Oral Daily   potassium chloride SA  20 mEq Oral Daily   rosuvastatin  40 mg Oral Daily   sacubitril-valsartan  1 tablet Oral BID   sodium chloride flush  3 mL Intravenous Q12H   Continuous Infusions:  sodium chloride     PRN Meds: sodium chloride, acetaminophen, morphine injection, nitroGLYCERIN, ondansetron (ZOFRAN) IV, sodium chloride flush   Vital Signs    Vitals:   01/10/22 0043 01/10/22 0129 01/10/22 0500 01/10/22 0848  BP: 129/69 119/71 119/68 121/79  Pulse:   68 75  Resp:   18   Temp:   98.3 F (36.8 C)   TempSrc:   Oral   SpO2: 96% 97% 95%   Weight:      Height:        Intake/Output Summary (Last 24 hours) at 01/10/2022 1124 Last data filed at 01/10/2022 0509 Gross per 24 hour  Intake 1316.82 ml  Output 1500 ml  Net -183.18 ml   Filed Weights   01/07/22 0148 01/08/22 0526 01/09/22 0348   Weight: 125 kg 118.5 kg 110.1 kg    Physical Exam    GEN- The patient is well appearing, alert and oriented x 3 today.   Head- normocephalic, atraumatic Eyes-  Sclera clear, conjunctiva pink Ears- hearing intact Oropharynx- clear Neck- supple Lungs- Clear to ausculation bilaterally, normal work of breathing Heart- Regular rate and rhythm, no murmurs, rubs or gallops GI- soft, NT, ND, + BS Extremities- no clubbing or cyanosis. No edema Skin- no rash or lesion Psych- euthymic mood, full affect Neuro- strength and sensation are intact  Labs    CBC Recent Labs    01/09/22 0327 01/10/22 0311  WBC 7.3 8.1  HGB 13.0 12.2*  HCT 39.0 36.2*  MCV 89.7 90.5  PLT 215 470   Basic Metabolic Panel Recent Labs    01/09/22 0327 01/10/22 0311  NA 130* 129*  K 5.0 4.8  CL 101 98  CO2 23 25  GLUCOSE 122* 139*  BUN 14 13  CREATININE 1.56* 1.65*  CALCIUM 8.2* 8.5*   Liver Function Tests Recent Labs    01/10/22 0311  AST 12*  ALT 10  ALKPHOS 67  BILITOT 0.8  PROT 5.4*  ALBUMIN 2.8*   No results for input(s): LIPASE, AMYLASE in the last 72 hours. Cardiac Enzymes No results for input(s): CKTOTAL, CKMB, CKMBINDEX, TROPONINI in the last 72 hours.   Telemetry    NSR 60s (personally reviewed)  Radiology    CARDIAC CATHETERIZATION  Result Date: 01/09/2022 Images from the original result were not included.   Prox LAD lesion is 100% stenosed.   Ost RCA to Prox RCA lesion is 100% stenosed.   Origin to Prox Graft lesion is 100% stenosed.   Origin to Mid Graft lesion before Prox LAD  is 100% stenosed.   Ost Cx to Prox Cx lesion is 90% stenosed.   1st Mrg lesion is 90% stenosed.   A stent was successfully placed.   Balloon angioplasty was performed.   Post intervention, there is a 30% residual stenosis.   Post intervention, there is a 0% residual stenosis. Curtis Perez is a 71 y.o. male  [id number] LOCATION:  FACILITY: [facility] PHYSICIAN: Quay Burow, M.D. [birth year] DATE OF  PROCEDURE:  01/09/2022 DATE OF DISCHARGE: CARDIAC CATHETERIZATION / PCI DES LCX History obtained from chart review.71yo man with CAD s/p CABG and PCI with a persistently reduced EF who presented with wide complex tachycardia.  He has a persistently reduced EF, flat troponins and wide-complex tachycardia.  Dr. Quentin Ore wanted a left heart cath to be performed to look for any ischemic substrate. PROCEDURE DESCRIPTION: The patient was brought to the second floor [facility]iac cath lab in the postabsorptive state. He was premedicated with IV Versed and fentanyl. His right groin was prepped and shaved in usual sterile fashion. Xylocaine 1% was used for local anesthesia. A 5 French sheath was inserted into the right common femoral artery using standard Seldinger technique.  5 French right left Judkins diagnostic catheters were used for selective coronary angiography, selective IMA graft angiography and obtain left heart pressures.  Isovue dye was used for the entirety of the case (110 cc of contrast total to patient).  Retrograde aortic, ventricular and pullback pressures were recorded. The patient received 6 mg of p.o. Plavix and 20 mg of IV Pepcid.  Using a 6 [place] XB 3.5 cm guide catheter along with 2, 0.14 [place] guidewires the AV groove circumflex and first marginal branch were both wired.  I performed PTA of the first OM branch in-stent restenosis with a 2.5 x 12 mm long noncompliant balloon at 14 to 16 atm resulting in reduction of a 90% "in-stent restenosis to less than 30%.  I performed PTA of the AV groove circumflex for "in-stent restenosis" within the previously placed stent but there was early recoil and therefore I elected to restent this with a 3.5 mm x 12 mm long Medtronic frontier drug-eluting stent up to 18 atm (3.75 mm) resulting in reduction of a 90% "in-stent restenosis to less than 20% residual.  Patient tolerated procedure well.  The wires were removed, the guide catheter was removed, and the  sheath was sewn securely in place.   Successful PTA of the first OM "in-stent restenosis and restenting of the AV groove circumflex in the setting of wide-complex tachycardia.  Patient was loaded with Plavix.  He was on Eliquis prior to his.  He will need to be on "triple therapy" for 1 month after which the aspirin can be discontinued.  Further work-up of the tachycardia per Dr. Lars Mage. Quay Burow. MD, [facility] 01/09/2022 5:22 PM     Patient  Profile     71yo man with CAD s/p CABG and PCI with a persistently reduced EF who presented with wide complex tachycardia.  Assessment & Plan    Ventricular tachycardia Scar mediated. Rate 185 bpm. Continue amiodarone 400 mg x 7 days then 200 mg daily. May need EP study prior to eventual ICD implant.   2. Chronic systolic CHF GDMT EF 43-73% 05/2021   3. AF w/ RVR Maintaining NSR and now on po amio.  Back to Eliquis.    4. CAD s/p CABG and PCI in 2018 S/p PCA balloon angioplasty 01/09/2021 to OM and circumflex. "Triple therapy" for 1 month, then drop ASA.   Tentatively home today pending timing of Lifevest. Want to wait until at least 6-8 weeks post PCI for cMRI  For questions or updates, please contact [facility] Please consult [email or web address] for contact info under Cardiology/STEMI.  Signed, Shirley Friar, PA-C  01/10/2022, 11:24 AM

## 2022-01-10 NOTE — Progress Notes (Signed)
Arterial sheath pulled beginning at 2330, until 2355. Observed by Monna Fam, RN. Site Level 0. Patient given post care instructions. Primary RN verified site. Best rest ends at 0355 as long as site remains Level 0.

## 2022-01-11 ENCOUNTER — Telehealth: Payer: Self-pay

## 2022-01-11 LAB — GLUCOSE, CAPILLARY
Glucose-Capillary: 122 mg/dL — ABNORMAL HIGH (ref 70–99)
Glucose-Capillary: 138 mg/dL — ABNORMAL HIGH (ref 70–99)

## 2022-01-11 MED ORDER — ATORVASTATIN CALCIUM 80 MG PO TABS
80.0000 mg | ORAL_TABLET | Freq: Every day | ORAL | 6 refills | Status: AC
Start: 2022-01-11 — End: ?

## 2022-01-11 MED ORDER — AMIODARONE HCL 200 MG PO TABS: ORAL_TABLET | ORAL | 3 refills | Status: AC

## 2022-01-11 MED ORDER — METOPROLOL SUCCINATE ER 25 MG PO TB24
75.0000 mg | ORAL_TABLET | Freq: Every day | ORAL | 6 refills | Status: AC
Start: 2022-01-11 — End: ?

## 2022-01-11 MED ORDER — FUROSEMIDE 20 MG PO TABS: 20.0000 mg | ORAL_TABLET | Freq: Every day | ORAL | Status: AC

## 2022-01-11 MED ORDER — ACETAMINOPHEN 325 MG PO TABS
650.0000 mg | ORAL_TABLET | ORAL | Status: AC | PRN
Start: 2022-01-11 — End: ?

## 2022-01-11 MED ORDER — CLOPIDOGREL BISULFATE 75 MG PO TABS
75.0000 mg | ORAL_TABLET | Freq: Every day | ORAL | 6 refills | Status: AC
Start: 2022-01-12 — End: ?

## 2022-01-11 NOTE — Telephone Encounter (Signed)
Called patient to se if he would like to r/s visit w/ CPP on 1/26 since he has been admitted to the hospital. [facility].   Total time spent: 3 minutes Vanetta Shawl, [facility]

## 2022-01-11 NOTE — Plan of Care (Signed)
°  Problem: Clinical Measurements: °Goal: Will remain free from infection °Outcome: Progressing °Goal: Respiratory complications will improve °Outcome: Progressing °  °

## 2022-01-11 NOTE — Care Management Important Message (Signed)
Important Message  Patient Details  Name: Curtis Perez MRN: [id number] Date of Birth: 11-15-1951   Medicare Important Message Given:  Yes     Shelda Altes 01/11/2022, 9:42 AM

## 2022-01-11 NOTE — Progress Notes (Signed)
CARDIAC REHAB PHASE I   HF education completed with pt. Pt educated on importance of daily weights and monitoring salt intake. LifeVest at bedside. Pt denies further questions or concerns at this time.   [id number] Rufina Falco, RN BSN 01/11/2022 9:08 AM

## 2022-01-11 NOTE — Discharge Summary (Signed)
ELECTROPHYSIOLOGY DISCHARGE SUMMARY    Patient ID: EINER MEALS,  MRN: [id number], DOB/AGE: April 14, 1951 71 y.o.  Admit date: 01/07/2022 Discharge date: 01/11/2022  Primary Care Physician: Unk Pinto, MD  Primary Cardiologist: None  Electrophysiologist:  Dr. Quentin Ore  Primary Discharge Diagnosis:  Ventricular tachycardia CAD  Secondary Discharge Diagnosis:  S/p CABG and PCI in 4098 Chronic systolic CHF Atrial fibrillation with RVR  Procedures This Admission:  [facility] 01/09/2022   Prox LAD lesion is 100% stenosed.   Ost RCA to Prox RCA lesion is 100% stenosed.   Origin to Prox Graft lesion is 100% stenosed.   Origin to Mid Graft lesion before Prox LAD  is 100% stenosed.   Ost Cx to Prox Cx lesion is 90% stenosed.   1st Mrg lesion is 90% stenosed.   A stent was successfully placed.   Balloon angioplasty was performed.   Post intervention, there is a 30% residual stenosis.   Post intervention, there is a 0% residual stenosis.  Echo 01/10/2022 LVEF 25-30% Brief HPI: 71yo man with CAD s/p CABG and PCI with a persistently reduced EF who presented with wide complex tachycardia.  Hospital Course:  The patient was admitted with WCT at cycle length of 185 bpm felt to be scare mediated VT. Placed on IV amiodarone gtt that was transitioned to po as tolerated.   Had planned for ICD pending LHC. Cath as above with PCI and balloon angioplasty to OM and Cx. With this, decision made to cover patient with amiodarone and lifevest, and re-evaluate with cMRI in 6-8 weeks post cath for ICD consideration.  They were monitored on telemetry overnight which demonstrated NSR.   The patient was examined and considered to be stable for discharge.  Wound care and restrictions were reviewed with the patient.  The patient will be seen back by  EP APP in 3-4 weeks for post hospital care.  cMRI will be scheduled at that time.   [facility] TOC visit made. He will be on "triple therapy" for 1 month, then drop  ASA.   Physical Exam: Vitals:   01/10/22 1202 01/10/22 2112 01/11/22 0500 01/11/22 0900  BP: (!) 102/58 108/65 126/73 115/84  Pulse: 62 60 74 63  Resp: 19  18 19   Temp: 98.4 F (36.9 C) 97.7 F (36.5 C) 98 F (36.7 C) 98 F (36.7 C)  TempSrc: Oral Oral Oral Oral  SpO2:  97% 99% 98%  Weight:   111.1 kg   Height:        GEN- The patient is well appearing, alert and oriented x 3 today.   HEENT: normocephalic, atraumatic; sclera clear, conjunctiva pink; hearing intact; oropharynx clear; neck supple  Lungs- Clear to ausculation bilaterally, normal work of breathing.  No wheezes, rales, rhonchi Heart- Regular rate and rhythm, no murmurs, rubs or gallops  GI- soft, non-tender, non-distended, bowel sounds present  Extremities- no clubbing, cyanosis, or edema; DP/PT/radial pulses 2+ bilaterally, groin without hematoma/bruit MS- no significant deformity or atrophy Skin- warm and dry, no rash or lesion Psych- euthymic mood, full affect Neuro- strength and sensation are intact   Labs:   Lab Results  Component Value Date   WBC 8.1 01/10/2022   HGB 12.2 (L) 01/10/2022   HCT 36.2 (L) 01/10/2022   MCV 90.5 01/10/2022   PLT 239 01/10/2022    Recent Labs  Lab 01/10/22 0311  NA 129*  K 4.8  CL 98  CO2 25  BUN 13  CREATININE 1.65*  CALCIUM 8.5*  PROT 5.4*  BILITOT 0.8  ALKPHOS 67  ALT 10  AST 12*  GLUCOSE 139*     Discharge Medications:  Allergies as of 01/11/2022       Reactions   Lisinopril Cough        Medication List     STOP taking these medications    rosuvastatin 40 MG tablet Commonly known as: CRESTOR       TAKE these medications    acetaminophen 325 MG tablet Commonly known as: TYLENOL Take 2 tablets (650 mg total) by mouth every 4 (four) hours as needed for headache or mild pain.   acyclovir 400 MG tablet Commonly known as: ZOVIRAX TAKE 1 TABLET 3 TIMES A DAY FOR MOUTH ULCERS What changed: See the new instructions.   amiodarone 200 MG  tablet Commonly known as: PACERONE Take 2 tablets (400 mg) daily for 7 days, then on 2/1 start taking 1 tablet (200 mg) daily.   apixaban 5 MG Tabs tablet Commonly known as: Eliquis TAKE 1 TABLET (5 MG TOTAL) BY MOUTH 2 (TWO) TIMES DAILY. What changed:  how much to take how to take this when to take this additional instructions   aspirin 81 MG chewable tablet Chew 1 tablet (81 mg total) daily by mouth.   atorvastatin 80 MG tablet Commonly known as: LIPITOR Take 1 tablet (80 mg total) by mouth daily.   buPROPion 300 MG 24 hr tablet Commonly known as: WELLBUTRIN XL Take  1 tablet  Daily  for Mood, Focus & Concentration      (Dx:   f33.2)               /           TAKE 1 TABLET BY MOUTH   clopidogrel 75 MG tablet Commonly known as: PLAVIX Take 1 tablet (75 mg total) by mouth daily with breakfast. Start taking on: January 12, 2022   Entresto 49-51 MG Generic drug: sacubitril-valsartan Take 1 tablet by mouth 2 (two) times daily.   ezetimibe 10 MG tablet Commonly known as: ZETIA Take  1 tablet  Daily for Cholesterol                                         /                                        TAKE  BY MOUTH What changed: See the new instructions.   ferrous sulfate 325 (65 FE) MG tablet Take 325 mg by mouth every Monday, Wednesday, and Friday.   Fish Oil 1000 MG Caps Take 2,000 mg by mouth daily.   FLUoxetine 40 MG capsule Commonly known as: PROZAC Take  1 capsule  Daily for Mood          (Dx:  f33.2)            /          TAKE 1 CAPSULE BY MOUTH What changed:  how much to take how to take this when to take this additional instructions   furosemide 20 MG tablet Commonly known as: LASIX Take 1 tablet (20 mg total) by mouth daily.   ipratropium 0.06 % nasal spray Commonly known as: ATROVENT Use 1 to 2 sprays each nostril 2 to 3 x /day  as needed for watery rhinorrhea What changed:  how much to take how to take this when to take this additional instructions    isosorbide mononitrate 30 MG 24 hr tablet Commonly known as: IMDUR TAKE 1 TABLET DAILY FOR HEART What changed: See the new instructions.   levothyroxine 100 MCG tablet Commonly known as: SYNTHROID Take  1 tablet  Daily  on an empty stomach with only water for 30 minutes & no Antacid meds, Calcium or Magnesium for 4 hours & avoid Biotin                     /         TAKE 1 TABLET BY MOUTH What changed:  how much to take how to take this when to take this additional instructions   metFORMIN 500 MG 24 hr tablet Commonly known as: GLUCOPHAGE-XR TAKE 2 TABLETS BY MOUTH TWICE DAILY WITH MEALS FOR DIABETES What changed:  how much to take how to take this when to take this additional instructions   metoprolol succinate 25 MG 24 hr tablet Commonly known as: TOPROL-XL Take 3 tablets (75 mg total) by mouth daily. Take with or immediately following a meal.   nitroGLYCERIN 0.4 MG SL tablet Commonly known as: Nitrostat Place 1 tablet (0.4 mg total) every 5 (five) minutes as needed under the tongue. What changed: reasons to take this   omeprazole 40 MG capsule Commonly known as: PRILOSEC TAKE 1 CAPSULE DAILY FOR ACID INDIGESTION & REFLUX What changed: See the new instructions.   potassium chloride SA 20 MEQ tablet Commonly known as: KLOR-CON M Take 20 mEq by mouth daily.   tirzepatide 2.5 MG/0.5ML Pen Commonly known as: MOUNJARO Inject 2.5 mg into the skin once a week. for T2_Diabetes   vitamin B-12 1000 MCG tablet Commonly known as: CYANOCOBALAMIN Take 1,000 mcg by mouth every Wednesday.   Vitamin D (Ergocalciferol) 1.25 MG (50000 UNIT) Caps capsule Commonly known as: DRISDOL Take 1 capsule (50,000 Units total) by mouth 3 (three) times a week. What changed: when to take this               Durable Medical Equipment  (From admission, onward)           Start     Ordered   01/10/22 0936  For home use only DME Vest life vest  Once       Comments: Length of Need:  3 months   01/10/22 0935            Disposition:  Discharge Instructions     AMB Referral to Cardiac Rehabilitation - Phase II   Complete by: As directed    Diagnosis: NSTEMI   After initial evaluation and assessments completed: Virtual Based Care may be provided alone or in conjunction with Phase 2 Cardiac Rehab based on patient barriers.: Yes       Follow-up Information     Shirley Friar, PA-C Follow up.   Specialty: Physician Assistant Why: on 2/14 at 1140 for post hospital follow up. Contact information: [address] [place] [address] [phone]                 Duration of Discharge Encounter: Greater than 30 minutes including physician time.  Jacalyn Lefevre, PA-C  01/11/2022 10:16 AM

## 2022-01-12 ENCOUNTER — Telehealth: Payer: PPO | Admitting: Pharmacist

## 2022-01-15 DIAGNOSIS — I469 Cardiac arrest, cause unspecified: Secondary | ICD-10-CM | POA: Diagnosis not present

## 2022-01-17 ENCOUNTER — Telehealth: Payer: Self-pay

## 2022-01-17 ENCOUNTER — Telehealth: Payer: Self-pay | Admitting: Cardiology

## 2022-01-17 NOTE — Telephone Encounter (Signed)
Pt recently hospitalized with CP and VT.  Discharged on 2022-01-23.  Pt passed away while at work.  [place] declined signing death certificate stating that pt died of natural causes.  Funeral home was given Dr. Mardene Speak name from recent hospitalization and was told to reach out to him about signing.  Advised I will send message to him and pt's regular cardiologist to see if either are willing to sign and we would be in touch as soon as we hear back.

## 2022-01-17 NOTE — Telephone Encounter (Signed)
Funeral home is reaching out to see if Dr. Quentin Ore would be the one to sign the patients death certificate.. please advise

## 2022-01-17 NOTE — Telephone Encounter (Signed)
Called patient to schedule  visit w/ CPP. Patient did not answer. Have reached out 3x w/ no response.   Total time spent: 2 minutes Vanetta Shawl, [facility]

## 2022-01-18 ENCOUNTER — Ambulatory Visit (HOSPITAL_BASED_OUTPATIENT_CLINIC_OR_DEPARTMENT_OTHER): Payer: PPO | Admitting: Nurse Practitioner

## 2022-01-18 DIAGNOSIS — 419620001 Death: Secondary | SNOMED CT | POA: Diagnosis not present

## 2022-01-18 NOTE — Progress Notes (Deleted)
Cardiology Office Note:    Date:  01/18/2022   ID:  Curtis Perez, DOB [birth year], MRN [id number]  PCP:  Unk Pinto, MD   [facility] Providers Cardiologist:  Mertie Moores, MD Electrophysiologist:  Vickie Epley, MD { Click to update primary MD,subspecialty MD or APP then REFRESH:1}    Referring MD: Unk Pinto, MD   No chief complaint on file. ***  History of Present Illness:    Curtis Perez is a 71 y.o. male with a hx of HTN, CAD s/p CABG x 3 in 2013, MI in 2004, chronic HFrEF, VT, DM, hyperlipidemia, PAF, tobacco abuse, obesity History of CABG 03/2012 (LIMA to LAD, SVG to OM1, SVG to LPDA, EVH via right thigh and leg) Stent 2018 History of cardioversion 10/10/19.  His baseline creatinine is felt to be 1.49  Last seen in our office on 07/11/21 by Dr. Acie Fredrickson at which time he described a "pin prick" sensation in his chest. He reported no further episodes of atrial fibrillation at that visit and no additional testing was ordered. He was advised to follow-up in 1 year.   He presented to [facility] ED on 12/19/21 for evaluation of worsening shortness of breath over the previous 2 to 3 days.  He additionally reported orthopnea and lower extremity swelling. Hs troponin mildly elevated, stable with reading 6 months prior.  EKG revealed atrial fibrillation with rate control.  His magnesium level was slightly low and he received replacement.  BNP was elevated at greater than 3000 and he was treated with IV Lasix with improvement of symptoms and was discharged home with plan for close follow-up with cardiology.  He presented to the [facility] ED on 01/07/22 with chest pain radiating into his back and palpitations that had been present for about 2 hours.  While he was in the waiting room his chest pain worsened and he was attached to a cardiac monitor and found to be in VT. He spontaneously converted out of VT into atrial fibrillation. Hs troponins were elevated at 93 ?98. 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 Cardiac MRI 6-8 weeks LifeVest in place, needs ICD No driving for 6 months Appt with AT 2/14  Past Medical History:  Diagnosis Date   Acute inferior myocardial infarction ([place]) 10/24/2017   Adenomatous colon polyp 02/15/2009   Bradycardia    CAD (coronary artery disease)    remote MI in 2004 with PCI; s/p CABG x 3 03/2012   CHF (congestive heart failure) (H[facility])    COPD (chronic obstructive pulmonary disease) ([place])    Depression    Family history of malignant neoplasm of gastrointestinal tract    GERD (gastroesophageal reflux disease)    Hyperlipidemia    Hypertension    Hypothyroidism    Myocardial infarction ([place])    2004   Obesity    Prediabetes    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   LIMA to LAD, SVG to OM1, SVG to LPDA, EVH via right thigh and leg   Tobacco abuse 03/28/2012   Vitamin D deficiency     Past Surgical History:  Procedure Laterality Date   BACK SURGERY     CARDIOVERSION N/A 10/10/2019   Procedure: CARDIOVERSION;  Surgeon: Pixie Casino, MD;  Location: [place];  Service: Cardiovascular;  Laterality: N/A;   COLONOSCOPY  02/15/2009   CORONARY ANGIOPLASTY WITH STENT PLACEMENT     about 8 years ago at [facility] GRAFT  04/02/2012  Procedure: CORONARY ARTERY BYPASS GRAFTING (CABG);  Surgeon: Rexene Alberts, MD;  Location: [place];  Service: Open Heart Surgery;  Laterality: N/A;  On pump, times three graphs using endoscopically harvested right greater saphenous vein and left internal mammary artery.    CORONARY STENT INTERVENTION N/A 01/09/2022   Procedure: CORONARY STENT INTERVENTION;  Surgeon: Lorretta Harp, MD;  Location: [place] CV LAB;  Service: Cardiovascular;  Laterality: N/A;   CORONARY/GRAFT ACUTE MI REVASCULARIZATION N/A 10/23/2017   Procedure: Coronary/Graft Acute MI Revascularization;  Surgeon: Jettie Booze, MD;  Location: [place] CV LAB;  Service:  Cardiovascular;  Laterality: N/A;   KNEE SURGERY     LEFT HEART CATH AND CORS/GRAFTS ANGIOGRAPHY N/A 10/23/2017   Procedure: LEFT HEART CATH AND CORS/GRAFTS ANGIOGRAPHY;  Surgeon: Jettie Booze, MD;  Location: [place] [facility];  Service: Cardiovascular;  Laterality: N/A;   LEFT HEART CATH AND CORS/GRAFTS ANGIOGRAPHY N/A 01/09/2022   Procedure: LEFT HEART CATH AND CORS/GRAFTS ANGIOGRAPHY;  Surgeon: Lorretta Harp, MD;  Location: [place] CV [facility];  Service: Cardiovascular;  Laterality: N/A;   TONSILLECTOMY      Current Medications: No outpatient medications have been marked as taking for the 01/18/22 encounter (Appointment) with Ann Maki, Lanice Schwab, NP.     Allergies:   Lisinopril   Social History   Socioeconomic History   Marital status: Divorced    Spouse name: Not on file   Number of children: 0   Years of education: Not on file   Highest education level: Not on file  Occupational History   Occupation: Disability   Occupation: Counsellor for [employer]  Tobacco Use   Smoking status: Every Day    Packs/day: 2.00    Years: 40.00    Pack years: 80.00    Types: Cigarettes   Smokeless tobacco: Never   Tobacco comments:    06/02/2021 reports down from 2 ppd/3-4 cigs/day   Vaping Use   Vaping Use: Never used  Substance and Sexual Activity   Alcohol use: No    Alcohol/week: 2.0 standard drinks    Types: 1 Glasses of wine, 1 Shots of liquor per week    Comment: Rarely   Drug use: No   Sexual activity: Not Currently  Other Topics Concern   Not on file  Social History Narrative   Not on file   Social Determinants of Health   Financial Resource Strain: Not on file  Food Insecurity: Not on file  Transportation Needs: Not on file  Physical Activity: Not on file  Stress: Not on file  Social Connections: Not on file     Family History: The patient's ***family history includes Alzheimer's disease in his mother; Colon cancer in his father and paternal  grandmother; Hypertension in his mother. There is no history of Esophageal cancer, Rectal cancer, or Stomach cancer.  ROS:   Please see the history of present illness.    *** All other systems reviewed and are negative.  Labs/Other Studies Reviewed:    The following studies were reviewed today:  Echo 01/10/22    Recent Labs: 12/22/2021: B Natriuretic Peptide 884.2 01/04/2022: TSH 2.01 01/07/2022: Magnesium 1.6 01/10/2022: ALT 10; BUN 13; Creatinine, Ser 1.65; Hemoglobin 12.2; Platelets 239; Potassium 4.8; Sodium 129  Recent Lipid Panel    Component Value Date/Time   CHOL 137 01/04/2022 1121   CHOL 92 (L) 02/11/2018 1011   TRIG 216 (H) 01/04/2022 1121   HDL 63 01/04/2022 1121   HDL 42 02/11/2018  1011   CHOLHDL 2.2 01/04/2022 1121   VLDL 34 10/24/2017 0247   LDLCALC 46 01/04/2022 1121     Risk Assessment/Calculations:   {Does this patient have ATRIAL FIBRILLATION?[phone]}       Physical Exam:    VS:  There were no vitals taken for this visit.    Wt Readings from Last 3 Encounters:  01/11/22 244 lb 14.4 oz (111.1 kg)  01/04/22 259 lb 3.2 oz (117.6 kg)  09/05/21 259 lb 3.2 oz (117.6 kg)     GEN: *** Well nourished, well developed in no acute distress HEENT: Normal NECK: No JVD; No carotid bruits CARDIAC: ***RRR, no murmurs, rubs, gallops RESPIRATORY:  Clear to auscultation without rales, wheezing or rhonchi  ABDOMEN: Soft, non-tender, non-distended MUSCULOSKELETAL:  No edema; No deformity. *** pedal pulses, ***bilaterally SKIN: Warm and dry NEUROLOGIC:  Alert and oriented x 3 PSYCHIATRIC:  Normal affect   EKG:  EKG is *** ordered today.  The ekg ordered today demonstrates ***  Diagnoses:    No diagnosis found. Assessment and Plan:     ***      {Are you ordering a CV Procedure (e.g. stress test, cath, DCCV, TEE, etc)?   Press F2        :[id number]}    Medication Adjustments/Labs and Tests Ordered: Current medicines are reviewed at length with the  patient today.  Concerns regarding medicines are outlined above.  No orders of the defined types were placed in this encounter.  No orders of the defined types were placed in this encounter.   There are no Patient Instructions on file for this visit.   Signed, Emmaline Life, NP  01/18/2022 7:32 AM    [place]

## 2022-01-18 DEATH — deceased

## 2022-01-19 NOTE — Telephone Encounter (Signed)
Calling back for update. Please advise  

## 2022-01-31 ENCOUNTER — Ambulatory Visit: Payer: PPO | Admitting: Student

## 2022-02-05 ENCOUNTER — Other Ambulatory Visit: Payer: Self-pay | Admitting: Nurse Practitioner

## 2022-04-10 ENCOUNTER — Other Ambulatory Visit: Payer: Self-pay | Admitting: Student

## 2022-04-13 ENCOUNTER — Ambulatory Visit: Payer: PPO | Admitting: Adult Health

## 2022-06-02 ENCOUNTER — Ambulatory Visit: Payer: PPO | Admitting: Adult Health

## 2022-07-13 ENCOUNTER — Ambulatory Visit: Payer: PPO | Admitting: Nurse Practitioner

## 2022-10-19 ENCOUNTER — Ambulatory Visit: Payer: PPO | Admitting: Internal Medicine

## 2023-01-09 ENCOUNTER — Encounter: Payer: PPO | Admitting: Internal Medicine
